# Patient Record
Sex: Female | Born: 1950 | Race: White | Hispanic: No | Marital: Married | Smoking: Never smoker
Health system: Southern US, Community
[De-identification: ages and names within clinical notes are randomized; demographics above are authoritative.]

## PROBLEM LIST (undated history)

## (undated) ENCOUNTER — Emergency Department (HOSPITAL_COMMUNITY): Discharge status: Against Medical Advice | Payer: 59

## (undated) DIAGNOSIS — I350 Nonrheumatic aortic (valve) stenosis: Secondary | ICD-10-CM

## (undated) DIAGNOSIS — I2510 Atherosclerotic heart disease of native coronary artery without angina pectoris: Secondary | ICD-10-CM

## (undated) DIAGNOSIS — I639 Cerebral infarction, unspecified: Secondary | ICD-10-CM

## (undated) DIAGNOSIS — K589 Irritable bowel syndrome without diarrhea: Secondary | ICD-10-CM

## (undated) DIAGNOSIS — Z9989 Dependence on other enabling machines and devices: Secondary | ICD-10-CM

## (undated) DIAGNOSIS — E785 Hyperlipidemia, unspecified: Secondary | ICD-10-CM

## (undated) DIAGNOSIS — I679 Cerebrovascular disease, unspecified: Secondary | ICD-10-CM

## (undated) DIAGNOSIS — Z789 Other specified health status: Secondary | ICD-10-CM

## (undated) DIAGNOSIS — K648 Other hemorrhoids: Secondary | ICD-10-CM

## (undated) DIAGNOSIS — K760 Fatty (change of) liver, not elsewhere classified: Secondary | ICD-10-CM

## (undated) DIAGNOSIS — G4733 Obstructive sleep apnea (adult) (pediatric): Secondary | ICD-10-CM

## (undated) DIAGNOSIS — M79643 Pain in unspecified hand: Secondary | ICD-10-CM

## (undated) DIAGNOSIS — I10 Essential (primary) hypertension: Secondary | ICD-10-CM

## (undated) DIAGNOSIS — E039 Hypothyroidism, unspecified: Secondary | ICD-10-CM

## (undated) DIAGNOSIS — Z87442 Personal history of urinary calculi: Secondary | ICD-10-CM

## (undated) DIAGNOSIS — C439 Malignant melanoma of skin, unspecified: Secondary | ICD-10-CM

## (undated) DIAGNOSIS — E119 Type 2 diabetes mellitus without complications: Secondary | ICD-10-CM

## (undated) DIAGNOSIS — D126 Benign neoplasm of colon, unspecified: Secondary | ICD-10-CM

## (undated) DIAGNOSIS — R7989 Other specified abnormal findings of blood chemistry: Secondary | ICD-10-CM

## (undated) DIAGNOSIS — K5732 Diverticulitis of large intestine without perforation or abscess without bleeding: Secondary | ICD-10-CM

## (undated) DIAGNOSIS — S99929A Unspecified injury of unspecified foot, initial encounter: Secondary | ICD-10-CM

## (undated) DIAGNOSIS — R011 Cardiac murmur, unspecified: Secondary | ICD-10-CM

## (undated) DIAGNOSIS — R945 Abnormal results of liver function studies: Secondary | ICD-10-CM

## (undated) HISTORY — PX: MELANOMA EXCISION: SHX5266

## (undated) HISTORY — DX: Other specified health status: Z78.9

## (undated) HISTORY — DX: Other hemorrhoids: K64.8

## (undated) HISTORY — DX: Hyperlipidemia, unspecified: E78.5

## (undated) HISTORY — PX: TUBAL LIGATION: SHX77

## (undated) HISTORY — DX: Other specified abnormal findings of blood chemistry: R79.89

## (undated) HISTORY — DX: Diverticulitis of large intestine without perforation or abscess without bleeding: K57.32

## (undated) HISTORY — DX: Pain in unspecified hand: M79.643

## (undated) HISTORY — DX: Cerebrovascular disease, unspecified: I67.9

## (undated) HISTORY — PX: OTHER SURGICAL HISTORY: SHX169

## (undated) HISTORY — DX: Cerebral infarction, unspecified: I63.9

## (undated) HISTORY — PX: CARDIAC CATHETERIZATION: SHX172

## (undated) HISTORY — DX: Irritable bowel syndrome, unspecified: K58.9

## (undated) HISTORY — DX: Atherosclerotic heart disease of native coronary artery without angina pectoris: I25.10

## (undated) HISTORY — DX: Unspecified injury of unspecified foot, initial encounter: S99.929A

## (undated) HISTORY — DX: Benign neoplasm of colon, unspecified: D12.6

## (undated) HISTORY — DX: Nonrheumatic aortic (valve) stenosis: I35.0

## (undated) HISTORY — DX: Abnormal results of liver function studies: R94.5

## (undated) HISTORY — DX: Fatty (change of) liver, not elsewhere classified: K76.0

---

## 1997-09-29 ENCOUNTER — Other Ambulatory Visit: Admission: RE | Admit: 1997-09-29 | Discharge: 1997-09-29 | Payer: Self-pay | Admitting: Obstetrics and Gynecology

## 1997-10-14 ENCOUNTER — Ambulatory Visit (HOSPITAL_COMMUNITY): Admission: RE | Admit: 1997-10-14 | Discharge: 1997-10-14 | Payer: Self-pay | Admitting: Obstetrics and Gynecology

## 1998-11-10 ENCOUNTER — Ambulatory Visit (HOSPITAL_COMMUNITY): Admission: RE | Admit: 1998-11-10 | Discharge: 1998-11-10 | Payer: Self-pay | Admitting: Obstetrics and Gynecology

## 1999-02-28 ENCOUNTER — Other Ambulatory Visit: Admission: RE | Admit: 1999-02-28 | Discharge: 1999-02-28 | Payer: Self-pay | Admitting: Obstetrics and Gynecology

## 1999-10-21 ENCOUNTER — Encounter: Payer: Self-pay | Admitting: Family Medicine

## 1999-10-21 LAB — CONVERTED CEMR LAB

## 1999-11-14 ENCOUNTER — Encounter: Payer: Self-pay | Admitting: Obstetrics and Gynecology

## 1999-11-14 ENCOUNTER — Ambulatory Visit (HOSPITAL_COMMUNITY): Admission: RE | Admit: 1999-11-14 | Discharge: 1999-11-14 | Payer: Self-pay | Admitting: Obstetrics and Gynecology

## 1999-11-18 ENCOUNTER — Encounter: Admission: RE | Admit: 1999-11-18 | Discharge: 1999-11-18 | Payer: Self-pay | Admitting: Obstetrics and Gynecology

## 1999-11-18 ENCOUNTER — Encounter: Payer: Self-pay | Admitting: Obstetrics and Gynecology

## 2000-05-08 ENCOUNTER — Other Ambulatory Visit: Admission: RE | Admit: 2000-05-08 | Discharge: 2000-05-08 | Payer: Self-pay | Admitting: Obstetrics and Gynecology

## 2000-11-20 ENCOUNTER — Ambulatory Visit (HOSPITAL_COMMUNITY): Admission: RE | Admit: 2000-11-20 | Discharge: 2000-11-20 | Payer: Self-pay | Admitting: Obstetrics and Gynecology

## 2000-11-20 ENCOUNTER — Encounter: Payer: Self-pay | Admitting: Obstetrics and Gynecology

## 2001-06-20 ENCOUNTER — Other Ambulatory Visit: Admission: RE | Admit: 2001-06-20 | Discharge: 2001-06-20 | Payer: Self-pay | Admitting: Obstetrics and Gynecology

## 2001-06-25 ENCOUNTER — Encounter: Admission: RE | Admit: 2001-06-25 | Discharge: 2001-06-25 | Payer: Self-pay | Admitting: Obstetrics and Gynecology

## 2001-06-25 ENCOUNTER — Encounter: Payer: Self-pay | Admitting: Obstetrics and Gynecology

## 2002-04-11 ENCOUNTER — Encounter: Payer: Self-pay | Admitting: Obstetrics and Gynecology

## 2002-04-11 ENCOUNTER — Ambulatory Visit (HOSPITAL_COMMUNITY): Admission: RE | Admit: 2002-04-11 | Discharge: 2002-04-11 | Payer: Self-pay | Admitting: Obstetrics and Gynecology

## 2002-06-27 ENCOUNTER — Other Ambulatory Visit: Admission: RE | Admit: 2002-06-27 | Discharge: 2002-06-27 | Payer: Self-pay | Admitting: Obstetrics and Gynecology

## 2003-04-30 ENCOUNTER — Encounter: Admission: RE | Admit: 2003-04-30 | Discharge: 2003-04-30 | Payer: Self-pay | Admitting: Obstetrics and Gynecology

## 2003-07-31 ENCOUNTER — Other Ambulatory Visit: Admission: RE | Admit: 2003-07-31 | Discharge: 2003-07-31 | Payer: Self-pay | Admitting: Obstetrics and Gynecology

## 2004-04-24 ENCOUNTER — Emergency Department (HOSPITAL_COMMUNITY): Admission: EM | Admit: 2004-04-24 | Discharge: 2004-04-24 | Payer: Self-pay | Admitting: Family Medicine

## 2004-05-02 ENCOUNTER — Ambulatory Visit: Payer: Self-pay | Admitting: Family Medicine

## 2004-06-20 ENCOUNTER — Encounter: Admission: RE | Admit: 2004-06-20 | Discharge: 2004-06-20 | Payer: Self-pay | Admitting: Obstetrics and Gynecology

## 2004-06-21 ENCOUNTER — Ambulatory Visit: Payer: Self-pay | Admitting: Family Medicine

## 2004-07-27 ENCOUNTER — Ambulatory Visit: Payer: Self-pay | Admitting: Family Medicine

## 2004-08-03 ENCOUNTER — Other Ambulatory Visit: Admission: RE | Admit: 2004-08-03 | Discharge: 2004-08-03 | Payer: Self-pay | Admitting: Obstetrics and Gynecology

## 2004-09-07 ENCOUNTER — Ambulatory Visit: Payer: Self-pay | Admitting: Family Medicine

## 2004-10-04 ENCOUNTER — Ambulatory Visit: Payer: Self-pay | Admitting: Family Medicine

## 2004-11-28 ENCOUNTER — Ambulatory Visit: Payer: Self-pay | Admitting: Family Medicine

## 2005-01-26 ENCOUNTER — Ambulatory Visit: Payer: Self-pay | Admitting: Family Medicine

## 2005-03-31 ENCOUNTER — Ambulatory Visit: Payer: Self-pay | Admitting: Family Medicine

## 2005-04-27 ENCOUNTER — Ambulatory Visit: Payer: Self-pay | Admitting: Family Medicine

## 2005-05-09 ENCOUNTER — Ambulatory Visit: Payer: Self-pay | Admitting: Family Medicine

## 2005-05-16 ENCOUNTER — Ambulatory Visit: Payer: Self-pay | Admitting: Internal Medicine

## 2005-05-19 ENCOUNTER — Ambulatory Visit (HOSPITAL_COMMUNITY): Admission: RE | Admit: 2005-05-19 | Discharge: 2005-05-19 | Payer: Self-pay | Admitting: Internal Medicine

## 2005-05-19 ENCOUNTER — Encounter (INDEPENDENT_AMBULATORY_CARE_PROVIDER_SITE_OTHER): Payer: Self-pay | Admitting: *Deleted

## 2005-05-19 ENCOUNTER — Ambulatory Visit: Payer: Self-pay | Admitting: Internal Medicine

## 2005-05-19 HISTORY — PX: COLONOSCOPY W/ BIOPSIES AND POLYPECTOMY: SHX1376

## 2005-05-29 ENCOUNTER — Ambulatory Visit: Payer: Self-pay | Admitting: Cardiology

## 2005-06-27 ENCOUNTER — Encounter: Admission: RE | Admit: 2005-06-27 | Discharge: 2005-06-27 | Payer: Self-pay | Admitting: Obstetrics and Gynecology

## 2005-07-03 ENCOUNTER — Encounter: Admission: RE | Admit: 2005-07-03 | Discharge: 2005-07-03 | Payer: Self-pay | Admitting: Obstetrics and Gynecology

## 2005-07-12 ENCOUNTER — Ambulatory Visit: Payer: Self-pay | Admitting: Cardiology

## 2005-07-13 ENCOUNTER — Ambulatory Visit: Payer: Self-pay | Admitting: Cardiology

## 2005-08-17 ENCOUNTER — Ambulatory Visit (HOSPITAL_COMMUNITY): Admission: RE | Admit: 2005-08-17 | Discharge: 2005-08-17 | Payer: Self-pay | Admitting: Otolaryngology

## 2005-08-30 ENCOUNTER — Ambulatory Visit (HOSPITAL_BASED_OUTPATIENT_CLINIC_OR_DEPARTMENT_OTHER): Admission: RE | Admit: 2005-08-30 | Discharge: 2005-08-30 | Payer: Self-pay | Admitting: Otolaryngology

## 2005-09-03 ENCOUNTER — Ambulatory Visit: Payer: Self-pay | Admitting: Internal Medicine

## 2005-09-29 ENCOUNTER — Ambulatory Visit: Payer: Self-pay | Admitting: Pulmonary Disease

## 2005-11-01 ENCOUNTER — Ambulatory Visit: Payer: Self-pay | Admitting: Pulmonary Disease

## 2006-03-29 ENCOUNTER — Ambulatory Visit: Payer: Self-pay | Admitting: Family Medicine

## 2006-04-26 ENCOUNTER — Ambulatory Visit: Payer: Self-pay | Admitting: Family Medicine

## 2006-05-22 DIAGNOSIS — S99929A Unspecified injury of unspecified foot, initial encounter: Secondary | ICD-10-CM

## 2006-05-22 HISTORY — DX: Unspecified injury of unspecified foot, initial encounter: S99.929A

## 2006-05-23 ENCOUNTER — Encounter: Admission: RE | Admit: 2006-05-23 | Discharge: 2006-08-21 | Payer: Self-pay | Admitting: Family Medicine

## 2006-07-03 ENCOUNTER — Ambulatory Visit: Payer: Self-pay | Admitting: Family Medicine

## 2006-07-03 LAB — CONVERTED CEMR LAB: Direct LDL: 228.4 mg/dL

## 2006-07-04 ENCOUNTER — Encounter: Payer: Self-pay | Admitting: Family Medicine

## 2006-09-05 ENCOUNTER — Encounter: Admission: RE | Admit: 2006-09-05 | Discharge: 2006-09-05 | Payer: Self-pay | Admitting: Obstetrics and Gynecology

## 2006-09-18 ENCOUNTER — Encounter: Payer: Self-pay | Admitting: Family Medicine

## 2006-09-18 DIAGNOSIS — E0590 Thyrotoxicosis, unspecified without thyrotoxic crisis or storm: Secondary | ICD-10-CM | POA: Insufficient documentation

## 2006-09-18 DIAGNOSIS — E785 Hyperlipidemia, unspecified: Secondary | ICD-10-CM

## 2006-09-18 DIAGNOSIS — G4733 Obstructive sleep apnea (adult) (pediatric): Secondary | ICD-10-CM

## 2006-09-18 DIAGNOSIS — E1165 Type 2 diabetes mellitus with hyperglycemia: Secondary | ICD-10-CM

## 2006-09-18 DIAGNOSIS — R7989 Other specified abnormal findings of blood chemistry: Secondary | ICD-10-CM | POA: Insufficient documentation

## 2006-10-03 ENCOUNTER — Ambulatory Visit: Payer: Self-pay | Admitting: Family Medicine

## 2006-10-03 DIAGNOSIS — L259 Unspecified contact dermatitis, unspecified cause: Secondary | ICD-10-CM

## 2006-10-10 ENCOUNTER — Ambulatory Visit: Payer: Self-pay | Admitting: Family Medicine

## 2007-05-02 ENCOUNTER — Encounter: Payer: Self-pay | Admitting: Family Medicine

## 2007-05-23 DIAGNOSIS — M79643 Pain in unspecified hand: Secondary | ICD-10-CM

## 2007-05-23 HISTORY — DX: Pain in unspecified hand: M79.643

## 2007-06-28 ENCOUNTER — Ambulatory Visit: Payer: Self-pay | Admitting: Family Medicine

## 2007-06-28 DIAGNOSIS — I10 Essential (primary) hypertension: Secondary | ICD-10-CM

## 2007-07-04 LAB — CONVERTED CEMR LAB
ALT: 52 units/L — ABNORMAL HIGH (ref 0–35)
AST: 30 units/L (ref 0–37)
Basophils Relative: 0.6 % (ref 0.0–1.0)
Bilirubin, Direct: 0.1 mg/dL (ref 0.0–0.3)
CO2: 28 meq/L (ref 19–32)
Calcium: 9.6 mg/dL (ref 8.4–10.5)
Chloride: 102 meq/L (ref 96–112)
Eosinophils Relative: 2.8 % (ref 0.0–5.0)
GFR calc non Af Amer: 93 mL/min
Glucose, Bld: 157 mg/dL — ABNORMAL HIGH (ref 70–99)
HDL: 32 mg/dL — ABNORMAL LOW (ref >39.0)
Hgb A1c MFr Bld: 7.8 % — ABNORMAL HIGH (ref 4.6–6.0)
Platelets: 216 10*3/uL (ref 150–400)
RBC: 4.47 M/uL (ref 3.87–5.11)
RDW: 12.4 % (ref 11.5–14.6)
Total Protein: 6.8 g/dL (ref 6.0–8.3)
Triglycerides: 207 mg/dL (ref 0–149)
VLDL: 41 mg/dL — ABNORMAL HIGH (ref 0–40)
WBC: 5.1 10*3/uL (ref 4.5–10.5)

## 2007-07-08 ENCOUNTER — Telehealth (INDEPENDENT_AMBULATORY_CARE_PROVIDER_SITE_OTHER): Payer: Self-pay | Admitting: Internal Medicine

## 2007-07-08 ENCOUNTER — Telehealth: Payer: Self-pay | Admitting: Family Medicine

## 2007-07-10 ENCOUNTER — Ambulatory Visit: Payer: Self-pay | Admitting: Family Medicine

## 2007-10-01 ENCOUNTER — Encounter: Admission: RE | Admit: 2007-10-01 | Discharge: 2007-10-01 | Payer: Self-pay | Admitting: Obstetrics and Gynecology

## 2007-12-31 ENCOUNTER — Ambulatory Visit: Payer: Self-pay | Admitting: Family Medicine

## 2008-01-01 LAB — CONVERTED CEMR LAB
BUN: 17 mg/dL (ref 6–23)
CO2: 29 meq/L (ref 19–32)
Calcium: 10.2 mg/dL (ref 8.4–10.5)
Cholesterol: 295 mg/dL (ref 0–200)
Creatinine, Ser: 0.5 mg/dL (ref 0.4–1.2)
Creatinine,U: 58.3 mg/dL
Direct LDL: 222.8 mg/dL
Total CHOL/HDL Ratio: 7.9
Triglycerides: 214 mg/dL (ref 0–149)

## 2008-01-29 ENCOUNTER — Telehealth (INDEPENDENT_AMBULATORY_CARE_PROVIDER_SITE_OTHER): Payer: Self-pay | Admitting: Internal Medicine

## 2008-02-06 ENCOUNTER — Ambulatory Visit: Payer: Self-pay | Admitting: Cardiology

## 2008-03-03 ENCOUNTER — Ambulatory Visit: Payer: Self-pay | Admitting: Family Medicine

## 2008-03-04 LAB — CONVERTED CEMR LAB: Hgb A1c MFr Bld: 6.5 % — ABNORMAL HIGH (ref 4.6–6.0)

## 2008-03-22 LAB — HM DIABETES EYE EXAM: HM Diabetic Eye Exam: NORMAL

## 2008-07-07 ENCOUNTER — Ambulatory Visit: Payer: Self-pay | Admitting: Family Medicine

## 2008-07-14 ENCOUNTER — Telehealth (INDEPENDENT_AMBULATORY_CARE_PROVIDER_SITE_OTHER): Payer: Self-pay | Admitting: Internal Medicine

## 2008-07-14 LAB — CONVERTED CEMR LAB
Creatinine,U: 65.8 mg/dL
Microalb Creat Ratio: 3 mg/g (ref 0.0–30.0)
Microalb, Ur: 0.2 mg/dL (ref 0.0–1.9)

## 2008-07-17 ENCOUNTER — Telehealth (INDEPENDENT_AMBULATORY_CARE_PROVIDER_SITE_OTHER): Payer: Self-pay | Admitting: Internal Medicine

## 2008-11-12 ENCOUNTER — Ambulatory Visit: Payer: Self-pay | Admitting: Family Medicine

## 2008-11-12 DIAGNOSIS — R21 Rash and other nonspecific skin eruption: Secondary | ICD-10-CM | POA: Insufficient documentation

## 2009-04-01 ENCOUNTER — Ambulatory Visit: Payer: Self-pay | Admitting: Family Medicine

## 2009-04-08 ENCOUNTER — Telehealth (INDEPENDENT_AMBULATORY_CARE_PROVIDER_SITE_OTHER): Payer: Self-pay | Admitting: Internal Medicine

## 2009-04-12 ENCOUNTER — Telehealth (INDEPENDENT_AMBULATORY_CARE_PROVIDER_SITE_OTHER): Payer: Self-pay | Admitting: Internal Medicine

## 2009-04-13 ENCOUNTER — Encounter (INDEPENDENT_AMBULATORY_CARE_PROVIDER_SITE_OTHER): Payer: Self-pay | Admitting: Internal Medicine

## 2009-04-14 LAB — CONVERTED CEMR LAB
ALT: 62 units/L — ABNORMAL HIGH (ref 0–35)
AST: 34 units/L (ref 0–37)
Calcium: 10 mg/dL (ref 8.4–10.5)
Creatinine, Ser: 0.6 mg/dL (ref 0.4–1.2)
Direct LDL: 240.5 mg/dL
GFR calc non Af Amer: 109.7 mL/min (ref >60)
Hgb A1c MFr Bld: 6.9 % — ABNORMAL HIGH (ref 4.6–6.5)
Sodium: 142 meq/L (ref 135–145)
Total CHOL/HDL Ratio: 8
VLDL: 34.2 mg/dL (ref 0.0–40.0)

## 2009-04-22 ENCOUNTER — Ambulatory Visit: Payer: Self-pay | Admitting: Cardiology

## 2009-04-22 LAB — CONVERTED CEMR LAB: Cholesterol, target level: 200 mg/dL

## 2009-05-31 ENCOUNTER — Ambulatory Visit: Payer: Self-pay | Admitting: Family Medicine

## 2009-05-31 ENCOUNTER — Telehealth: Payer: Self-pay | Admitting: Internal Medicine

## 2009-05-31 LAB — CONVERTED CEMR LAB
AST: 30 units/L (ref 0–37)
Direct LDL: 185.6 mg/dL
HDL: 43.2 mg/dL (ref >39.00)
Total CHOL/HDL Ratio: 6
VLDL: 38 mg/dL (ref 0.0–40.0)

## 2009-06-08 ENCOUNTER — Telehealth: Payer: Self-pay | Admitting: Family Medicine

## 2009-06-10 ENCOUNTER — Ambulatory Visit: Payer: Self-pay | Admitting: Cardiology

## 2009-06-15 ENCOUNTER — Ambulatory Visit: Payer: Self-pay | Admitting: Family Medicine

## 2009-06-16 ENCOUNTER — Encounter: Admission: RE | Admit: 2009-06-16 | Discharge: 2009-06-16 | Payer: Self-pay | Admitting: Family Medicine

## 2009-08-09 ENCOUNTER — Telehealth: Payer: Self-pay | Admitting: Internal Medicine

## 2009-09-22 ENCOUNTER — Telehealth (INDEPENDENT_AMBULATORY_CARE_PROVIDER_SITE_OTHER): Payer: Self-pay | Admitting: *Deleted

## 2009-09-27 ENCOUNTER — Telehealth: Payer: Self-pay | Admitting: Family Medicine

## 2009-09-28 ENCOUNTER — Ambulatory Visit: Payer: Self-pay | Admitting: Family Medicine

## 2009-09-29 ENCOUNTER — Ambulatory Visit: Payer: Self-pay | Admitting: Family Medicine

## 2009-09-29 LAB — CONVERTED CEMR LAB
ALT: 54 units/L — ABNORMAL HIGH (ref 0–35)
Albumin: 4.4 g/dL (ref 3.5–5.2)
Alkaline Phosphatase: 63 units/L (ref 39–117)
CO2: 30 meq/L (ref 19–32)
Calcium: 9.5 mg/dL (ref 8.4–10.5)
Chloride: 105 meq/L (ref 96–112)
Creatinine, Ser: 0.5 mg/dL (ref 0.4–1.2)
Glucose, Bld: 121 mg/dL — ABNORMAL HIGH (ref 70–99)
Hgb A1c MFr Bld: 6.8 % — ABNORMAL HIGH (ref 4.6–6.5)
Sodium: 143 meq/L (ref 135–145)
Total CHOL/HDL Ratio: 5
Total Protein: 6.8 g/dL (ref 6.0–8.3)
Triglycerides: 159 mg/dL — ABNORMAL HIGH (ref 0.0–149.0)

## 2009-09-30 ENCOUNTER — Ambulatory Visit: Payer: Self-pay | Admitting: Internal Medicine

## 2010-01-04 ENCOUNTER — Ambulatory Visit: Payer: Self-pay | Admitting: Family Medicine

## 2010-01-04 ENCOUNTER — Telehealth (INDEPENDENT_AMBULATORY_CARE_PROVIDER_SITE_OTHER): Payer: Self-pay | Admitting: *Deleted

## 2010-01-06 LAB — CONVERTED CEMR LAB
ALT: 53 units/L — ABNORMAL HIGH (ref 0–35)
AST: 27 units/L (ref 0–37)
BUN: 11 mg/dL (ref 6–23)
CO2: 28 meq/L (ref 19–32)
Chloride: 103 meq/L (ref 96–112)
Cholesterol: 229 mg/dL — ABNORMAL HIGH (ref 0–200)
Creatinine, Ser: 0.7 mg/dL (ref 0.4–1.2)
Glucose, Bld: 156 mg/dL — ABNORMAL HIGH (ref 70–99)
HDL: 37.2 mg/dL — ABNORMAL LOW (ref >39.00)
Potassium: 4.8 meq/L (ref 3.5–5.1)
Triglycerides: 213 mg/dL — ABNORMAL HIGH (ref 0.0–149.0)

## 2010-01-13 ENCOUNTER — Ambulatory Visit: Payer: Self-pay | Admitting: Internal Medicine

## 2010-06-03 ENCOUNTER — Ambulatory Visit
Admission: RE | Admit: 2010-06-03 | Discharge: 2010-06-03 | Payer: Self-pay | Source: Home / Self Care | Attending: Family Medicine | Admitting: Family Medicine

## 2010-06-03 ENCOUNTER — Other Ambulatory Visit: Payer: Self-pay | Admitting: Family Medicine

## 2010-06-03 ENCOUNTER — Telehealth (INDEPENDENT_AMBULATORY_CARE_PROVIDER_SITE_OTHER): Payer: Self-pay | Admitting: *Deleted

## 2010-06-03 LAB — LIPID PANEL
Cholesterol: 279 mg/dL — ABNORMAL HIGH (ref 0–200)
HDL: 43.1 mg/dL (ref >39.00)
Total CHOL/HDL Ratio: 6
Triglycerides: 223 mg/dL — ABNORMAL HIGH (ref 0.0–149.0)
VLDL: 44.6 mg/dL — ABNORMAL HIGH (ref 0.0–40.0)

## 2010-06-03 LAB — GLUCOSE, RANDOM: Glucose, Bld: 138 mg/dL — ABNORMAL HIGH (ref 70–99)

## 2010-06-03 LAB — HEMOGLOBIN A1C: Hgb A1c MFr Bld: 7.3 % — ABNORMAL HIGH (ref 4.6–6.5)

## 2010-06-03 LAB — HEPATIC FUNCTION PANEL
ALT: 53 U/L — ABNORMAL HIGH (ref 0–35)
AST: 33 U/L (ref 0–37)
Albumin: 4.4 g/dL (ref 3.5–5.2)
Alkaline Phosphatase: 56 U/L (ref 39–117)
Bilirubin, Direct: 0.1 mg/dL (ref 0.0–0.3)
Total Bilirubin: 1 mg/dL (ref 0.3–1.2)
Total Protein: 7 g/dL (ref 6.0–8.3)

## 2010-06-03 LAB — TSH: TSH: 3.59 u[IU]/mL (ref 0.35–5.50)

## 2010-06-03 LAB — LDL CHOLESTEROL, DIRECT: Direct LDL: 220.3 mg/dL

## 2010-06-09 ENCOUNTER — Ambulatory Visit: Admit: 2010-06-09 | Payer: Self-pay

## 2010-06-10 ENCOUNTER — Ambulatory Visit
Admission: RE | Admit: 2010-06-10 | Discharge: 2010-06-10 | Payer: Self-pay | Source: Home / Self Care | Attending: Family Medicine | Admitting: Family Medicine

## 2010-06-12 ENCOUNTER — Encounter: Payer: Self-pay | Admitting: Obstetrics and Gynecology

## 2010-06-12 ENCOUNTER — Encounter: Payer: Self-pay | Admitting: Family Medicine

## 2010-06-13 ENCOUNTER — Ambulatory Visit: Admit: 2010-06-13 | Payer: Self-pay | Admitting: Family Medicine

## 2010-06-21 NOTE — Progress Notes (Signed)
Summary: Labs prior to appt  Phone Note Call from Patient Call back at Work Phone [phone]   Caller: Patient Call For: Dr. Dayton Martes Summary of Call: Patient has an appt on Wednesday with Dr. Dayton Martes and she wants labs drawn before her appt.  She said she could come by tomorrow morning and have her labs drawn.  Please advise.   Initial call taken by: Linde Gillis CMA Duncan Dull),  Sep 27, 2009 1:29 PM  Follow-up for Phone Call        Yes we can draw TSH (242.9), a1c (250.), BMET(242.9), lipid panel, hepatic panel (272.4) Ruthe Mannan MD  Sep 27, 2009 1:32 PM  Patient advised, appt made for the above labs on 09/28/2009. Follow-up by: Linde Gillis CMA Duncan Dull),  Sep 27, 2009 1:37 PM

## 2010-06-21 NOTE — Progress Notes (Signed)
----   Converted from flag ---- ---- 01/04/2010 8:36 AM, Ruthe Mannan MD wrote: yes ok to add TSH and BMET  ---- 01/04/2010 7:58 AM, Liane Comber CMA (AAMA) wrote: Pt had labs this am she request to have thyroid and blood sugar test to las. She says thyroid med was changed months ago and she started a blood sugar med. ------------------------------

## 2010-06-21 NOTE — Progress Notes (Signed)
Summary: samples   Phone Note Call from Patient   Caller: Patient Reason for Call: Talk to Nurse Summary of Call: pt wants to know if she can get samples of her Zetia. please call her and let her know Initial call taken by: Edman Circle,  Sep 22, 2009 8:11 AM  Follow-up for Phone Call        Spoke with pt. and she is aware to pick up samples at front desk.  lot # [id number] Exp 06/13  #21 tablets Follow-up by: Bethena Midget, RN, BSN,  Sep 22, 2009 8:59 AM

## 2010-06-21 NOTE — Assessment & Plan Note (Signed)
Summary: rov..mp   Visit Type:  Follow-up  CC:  dyslipidemia follow-up.  History of Present Illness:    Lipid Clinic Visit      The patient comes in today for dyslipidemia follow-up.  The patient has no complaints of medication problems, chest pain, shortness of breath, muscle aches, and muscle cramps.  Her current cholesterol therapy includes Zetia 10mg  daily and fish oil 4mg  daily.  She is intolerant to statins, niacin and Welchol. She has recently had her TSH checked and Synthroid dose adjusted.  Dietary compliance review reveals pt is starting a new diet.  She is doing proteins only for 1 week, protein and vegetables for 1 week, then adding carbohydrates back in the 3rd week.  Her typical meals now are breakfast: boiled egg or egg white and oatmeal, lunch- chicken, and dinner- stir-fried beef.   Review of exercise habits reveals that the patient is not exercising.  She had previously been walking on the treadmill for 20 minutes 2-3 times a week but has not done this since January because her treadmill was broken.    Lipid Management Provider  Weston Brass, PharmD  Current Medications (verified): 1)  Altace 2.5 Mg Caps (Ramipril) .... Take One By Mouth Daily 2)  Fish Oil  Oil (Fish Oil) .... Take Two By Mouth Two Times A Day As Directed 3)  Flax Seed Oil  Caps (Flaxseed (Linseed) Caps) .... Take Two By Mouth Q Am 4)  Aspirin 81 Mg Tbec (Aspirin) .... Take One By Mouth Daily 5)  Daily Vitamins/iron  Tabs (Multiple Vitamins-Iron) .... Take By Mouth Daily As Directed 6)  Onetouch Ultra Test   Strp (Glucose Blood) .... Use Daily As Directed 7)  Caltrate 600+d Plus 600-400 Mg-Unit  Tabs (Calcium Carbonate-Vit D-Min) .... Take 1 Tablet By Mouth Once A Day 8)  Metformin Hcl 500 Mg  Tabs (Metformin Hcl) .Marland Kitchen.. 1 By Mouth Two Times A Day 9)  Vitamin D 1000 Unit Caps (Cholecalciferol) .... Take One By Mouth Two Times A Day 10)  Zetia 10 Mg Tabs (Ezetimibe) .... Take One Tablet By Mouth Daily. 11)   Synthroid 100 Mcg Tabs (Levothyroxine Sodium) .Marland Kitchen.. 1 Tab By Mouth Daily 12)  Fenofibrate 54 Mg Tabs (Fenofibrate) .... Take One Tablet By Mouth Daily With A Meal  Allergies (verified): 1)  ! Sulfa 2)  ! Zocor 3)  ! Pravachol 4)  ! * Niaspan 5)  ! Lipitor 6)  ! * Crestor 7)  ! Noni Saupe  Past History:  Past Medical History: Last updated: 07/10/2007 Diabetes mellitus, type II Hyperlipidemia- with intol of all meds and no success at lipid clinic Hyperthyroidism- adv to hypothyroidism ? thyroid nodules Chronic pain and swelling of hard palate L foot/leg injury with ? torn muscle- 08   Vital Signs:  Patient profile:   60 year old female Height:      64.25 inches Weight:      179 pounds BMI:     30.60 Pulse rate:   82 / minute BP sitting:   102 / 70  (right arm)  Impression & Recommendations:  Problem # 1:  HYPERLIPIDEMIA (ICD-272.4) Assessment Improved Based on pt's previous lipid panel, her cholesterol has improved but she is still above goal.  Her TC 222 (goal <200), TG- 159 (goal <150), HDL 42.6 (goal>45) and LDL 171.1 (goal <70).  She is tolerating her current therapy but has a long history of medication intolerances.  She does not remember trying a fibrate in the past.  Will add a low dose fenofibrate to her regimen to see if it will help lower LDL closer to goal.  We discussed her new diet plan and the importance of a balanced meal.  I encouraged her to make vegetables 50% of the meal and meats and starches only 25% of her meal at the most.  She is also willing to replace her broken treadmill and start walking most days of the week.  We will recheck her lipid panel in 3 months.  She has been instructed to call if she experiences any side effects from the fenofibrate.    Her updated medication list for this problem includes:    Zetia 10 Mg Tabs (Ezetimibe) .Marland Kitchen... Take one tablet by mouth daily.    Fenofibrate 54 Mg Tabs (Fenofibrate) .Marland Kitchen... Take one tablet by mouth daily with  a meal  Patient Instructions: 1)  Start new medication- fenofibrate daily- Take this with food to help with upset stomach 2)  Continue diet high in protein and vegetables and low in starches 3)  Restart exercising on the treadmill for 20 minutes 3-4 times a week 4)  Recheck lipid panel on  5)  Next Lipid Clinic visit: 01/13/2010 at 3pm  Prescriptions: FENOFIBRATE 54 MG TABS (FENOFIBRATE) Take one tablet by mouth daily with a meal  #30 x 3   Entered by:   Weston Brass PharmD   Authorized by:   Hillis Range, MD   Signed by:   Weston Brass PharmD on 09/30/2009   Method used:   [facility]        [facility]  [address]ail[facility]       [address]       [place], [place]  [address]       Ph: [phone] or [phone]       Fax: [phone]   RxID:   [id number]

## 2010-06-21 NOTE — Progress Notes (Signed)
Summary: Wants lab results &? about synthroid  Phone Note Call from Patient Call back at [phone]   Caller: Patient Call For: Dr. Dayton Martes Summary of Call: Pt was [name] Bean's pt and is going to lipid clinic in [place]. Pt had lab test done on 05/31/09 and would like lab results called to her. Pt wonders if should continue Synthroid take one daily Brand name medically necessary.  If pt is to continue the Synthroid she will need refills [facility] to [facility] [phone]. pt can be reached at [phone] and pt said OK to wait until 06/09/09 when Dr. Dayton Martes returns to get answer for her question. Pt has appt with Dr. Dayton Martes on 09/29/09. Please advise.  Initial call taken by: Lewanda Rife LPN,  June 08, 2009 2:02 PM  Follow-up for Phone Call        I have not met this patient and I am not sure why her lab results were not followed.  TSH is a little low, I would recommend backing off to a lower dose but I would like to talk with her first to ask questions like whether or not she has tolerated lower doses in past, etc.  Can she make appt with me? Follow-up by: Ruthe Mannan MD,  June 08, 2009 8:23 PM     Appended Document: Wants lab results &? about synthroid Patient Advised. Appointment scheduled  06/15/09.

## 2010-06-21 NOTE — Assessment & Plan Note (Signed)
Summary: FOLLOW UP / LFW   Vital Signs:  Patient profile:   60 year old female Height:      64.25 inches Weight:      179 pounds BMI:     30.60 Temp:     98.4 degrees F oral Pulse rate:   80 / minute Pulse rhythm:   regular BP sitting:   122 / 74  (left arm) Cuff size:   regular  Vitals Entered By: Delilah Shan CMA Duncan Dull) (Sep 29, 2009 8:26 AM) CC: 6 months follow up   History of Present Illness: 60 yo pt here for follow up hyperthyroidism and HLD.  Hyperthyroidism- TSH was checked in 11/10 and it was 0.12.  [name] then decreased her Synthroid dosage from 137 micrograms (she had been on for several years) to 125 micrograms.  Rechecked last week and TSH in January and was 0.29, decreased to 100 mcg.  Feels much better now.  TSH 2.81.  DM- a1c 6,8 was 6.3 at last check. Taking Metformin 500 mg two times a day. Admits to not being very compliant with diet lately.  HLD- goes to lipid clinic due to elevated lipids and intolerance to meds. [id number] (was 185), HDL 42, and TG 159.  Currently only taking Zetia 10 mg daily but has appt tomorrow.   Current Medications (verified): 1)  Altace 2.5 Mg Caps (Ramipril) .... Take One By Mouth Daily 2)  Fish Oil  Oil (Fish Oil) .... Take Two By Mouth Two Times A Day As Directed 3)  Flax Seed Oil  Caps (Flaxseed (Linseed) Caps) .... Take Two By Mouth Q Am 4)  Aspirin 81 Mg Tbec (Aspirin) .... Take One By Mouth Daily 5)  Daily Vitamins/iron  Tabs (Multiple Vitamins-Iron) .... Take By Mouth Daily As Directed 6)  Onetouch Ultra Test   Strp (Glucose Blood) .... Use Daily As Directed 7)  Caltrate 600+d Plus 600-400 Mg-Unit  Tabs (Calcium Carbonate-Vit D-Min) .... Take 1 Tablet By Mouth Once A Day 8)  Metformin Hcl 500 Mg  Tabs (Metformin Hcl) .Marland Kitchen.. 1 By Mouth Two Times A Day 9)  Vitamin D 1000 Unit Caps (Cholecalciferol) .... Take One By Mouth Two Times A Day 10)  Zetia 10 Mg Tabs (Ezetimibe) .... Take One Tablet By Mouth Daily. 11)  Synthroid  100 Mcg Tabs (Levothyroxine Sodium) .Marland Kitchen.. 1 Tab By Mouth Daily  Allergies: 1)  ! Sulfa 2)  ! Zocor 3)  ! Pravachol 4)  ! * Niaspan 5)  ! Lipitor 6)  ! * Crestor 7)  ! * Wellchol  Physical Exam  General:  alert, well-developed, well-nourished, well-hydrated, and overweight-appearing.    Mouth:  Oral mucosa and oropharynx without lesions or exudates.  Teeth in good repair. Lungs:  normal respiratory effort, no intercostal retractions, no accessory muscle use, and normal breath sounds.   Heart:  normal rate, regular rhythm, and no murmur.   Psych:  normally interactive and good eye contact.     Impression & Recommendations:  Problem # 1:  HYPERTHYROIDISM (ICD-242.90) Assessment Improved continue current dose of Synthroid.  Problem # 2:  DIABETES MELLITUS, TYPE II (ICD-250.00) Assessment: Deteriorated Does not want to increase or change meds yet, wants to try diet first as she knows she has been noncompliant. Her updated medication list for this problem includes:    Altace 2.5 Mg Caps (Ramipril) .Marland Kitchen... Take one by mouth daily    Aspirin 81 Mg Tbec (Aspirin) .Marland Kitchen... Take one by mouth daily  Metformin Hcl 500 Mg Tabs (Metformin hcl) .Marland Kitchen... 1 by mouth two times a day  Problem # 3:  HYPERLIPIDEMIA (ICD-272.4) Assessment: Unchanged Remains poorly controlled.  Given labs to take to appt tomorrow at lipid clinic. Her updated medication list for this problem includes:    Zetia 10 Mg Tabs (Ezetimibe) .Marland Kitchen... Take one tablet by mouth daily.  Complete Medication List: 1)  Altace 2.5 Mg Caps (Ramipril) .... Take one by mouth daily 2)  Fish Oil Oil (Fish oil) .... Take two by mouth two times a day as directed 3)  Flax Seed Oil Caps (Flaxseed (linseed) caps) .... Take two by mouth q am 4)  Aspirin 81 Mg Tbec (Aspirin) .... Take one by mouth daily 5)  Daily Vitamins/iron Tabs (Multiple vitamins-iron) .... Take by mouth daily as directed 6)  Onetouch Ultra Test Strp (Glucose blood) .... Use  daily as directed 7)  Caltrate 600+d Plus 600-400 Mg-unit Tabs (Calcium carbonate-vit d-min) .... Take 1 tablet by mouth once a day 8)  Metformin Hcl 500 Mg Tabs (Metformin hcl) .Marland Kitchen.. 1 by mouth two times a day 9)  Vitamin D 1000 Unit Caps (Cholecalciferol) .... Take one by mouth two times a day 10)  Zetia 10 Mg Tabs (Ezetimibe) .... Take one tablet by mouth daily. 11)  Synthroid 100 Mcg Tabs (Levothyroxine sodium) .Marland Kitchen.. 1 tab by mouth daily  Patient Instructions: 1)  Try to cut out some sugars, carbs. 2)  Come back in 3 months for a1c (250). Prescriptions: ONETOUCH ULTRA TEST   STRP (GLUCOSE BLOOD) USE DAILY AS DIRECTED  #90 x 3   Entered and Authorized by:   Ruthe Mannan MD   Signed by:   Ruthe Mannan MD on 09/29/2009   Method used:   [facility]        [facility]  [address] ([facility]ail)       [address]       [place], [place]  [address]       Ph: [phone] or [phone]       Fax: [phone]   RxID:   [id number]   Current Allergies (reviewed today): ! SULFA ! ZOCOR ! PRAVACHOL ! * NIASPAN ! LIPITOR ! * CRESTOR ! [facility]

## 2010-06-21 NOTE — Progress Notes (Signed)
Summary: pt needs samples of zetia 10mg    Phone Note Call from Patient Call back at Work Phone [phone]   Caller: Patient Summary of Call: want to speak with some one about getting some zetia 10mg  Initial call taken by: Judie Grieve,  May 31, 2009 10:12 AM  Follow-up for Phone Call        Called pt and found out she is at MD office.  Have pulled samples and will return call to patient in 1 hour per receptionist request.   Follow-up by: Shelby Dubin PharmD, BCPS, CPP,  May 31, 2009 10:58 AM  Additional Follow-up for Phone Call Additional follow up Details #1::        Received note from [facility] that they gave samples to patient when she had her labs drawn on 1/10.Marland Kitchenmp Additional Follow-up by: Shelby Dubin PharmD, BCPS, CPP,  June 01, 2009 11:00 AM

## 2010-06-21 NOTE — Progress Notes (Signed)
Summary: next COL?   Phone Note Call from Patient Call back at Home Phone [phone]   Caller: Patient Call For: Dr. Leone Payor Reason for Call: Talk to Nurse Summary of Call: would like to know when her last COL was and when she should have her next Initial call taken by: Vallarie Mare,  August 09, 2009 3:49 PM  Follow-up for Phone Call        Left message for patient to call back Darcey Nora RN, [facility]  August 09, 2009 4:05 PM  Left message for patient to call back Darcey Nora RN, [facility]  August 11, 2009 11:35 AM   No return call from the patient.  I have left her a message that she was due for an REV recall in 09 to discuss a colon, that appointment was never made.  I have asked her to call back and make an appointment to see Dr Leone Payor to discuss colon.    Follow-up by: Darcey Nora RN, CGRN,  August 12, 2009 10:40 AM

## 2010-06-21 NOTE — Assessment & Plan Note (Signed)
Summary: 30 minF/U - Synthroid dosage (BDB pt.) Joanna [name]   Vital Signs:  Patient profile:   60 year old female Height:      64.25 inches Weight:      177.38 pounds BMI:     30.32 Temp:     97.9 degrees F oral Pulse rate:   76 / minute Pulse rhythm:   regular BP sitting:   132 / 82  (left arm) Cuff size:   regular  Vitals Entered By: Delilah Shan CMA Duncan Dull) (June 15, 2009 9:45 AM) CC: 30 min. F/U - Synthroid dosage   History of Present Illness: 60 yo pt of Joanna [name]'s new to me here to discuss TSH.  Hyperthyroidism- TSH was checked in 11/10 and it was 0.12.  Joanna [name] then decreased her Synthroid dosage from 137 micrograms (she had been on for several years) to 125 micrograms.  Rechecked last week and TSH is now 0.29.  She said she does sometimes feel a little jittery.  Denies any diarrhea, heat intolerance, or other symptoms.  Lately she has noticed a funny sensation when she swallows, like her food/liquid may be having a hard time going down.  No pain with swallowing.  Current Medications (verified): 1)  Altace 2.5 Mg Caps (Ramipril) .... Take One By Mouth Daily 2)  Fish Oil  Oil (Fish Oil) .... Take Two By Mouth Two Times A Day As Directed 3)  Flax Seed Oil  Caps (Flaxseed (Linseed) Caps) .... Take Two By Mouth Q Am 4)  Aspirin 81 Mg Tbec (Aspirin) .... Take One By Mouth Daily 5)  Daily Vitamins/iron  Tabs (Multiple Vitamins-Iron) .... Take By Mouth Daily As Directed 6)  Onetouch Ultra Test   Strp (Glucose Blood) .... Use Daily As Directed 7)  Caltrate 600+d Plus 600-400 Mg-Unit  Tabs (Calcium Carbonate-Vit D-Min) .... Take 1 Tablet By Mouth Once A Day 8)  Metformin Hcl 500 Mg  Tabs (Metformin Hcl) .Marland Kitchen.. 1 By Mouth Two Times A Day 9)  Vitamin D 1000 Unit Caps (Cholecalciferol) .... Take One By Mouth Two Times A Day 10)  Zetia 10 Mg Tabs (Ezetimibe) .... Take One Tablet By Mouth Daily. 11)  Synthroid 100 Mcg Tabs (Levothyroxine Sodium) .Marland Kitchen.. 1 Tab By Mouth Daily  Allergies: 1)  !  Sulfa 2)  ! Zocor 3)  ! Pravachol 4)  ! * Niaspan 5)  ! Lipitor 6)  ! * Crestor 7)  ! * Wellchol  Review of Systems      See HPI CV:  Denies chest pain or discomfort and palpitations.  Physical Exam  General:  alert, well-developed, well-nourished, well-hydrated, and overweight-appearing.  NAD, weight down 1 pound since November.  Eyes:  No corneal or conjunctival inflammation noted. EOMI. Perrla. Funduscopic exam benign, without hemorrhages, exudates or papilledema. Vision grossly normal. Mouth:  Oral mucosa and oropharynx without lesions or exudates.  Teeth in good repair. Neck:  enlarged, non tender thyroid Lungs:  normal respiratory effort, no intercostal retractions, no accessory muscle use, and normal breath sounds.   Heart:  normal rate, regular rhythm, and no murmur.   Psych:  normally interactive and good eye contact.     Impression & Recommendations:  Problem # 1:  HYPERTHYROIDISM (ICD-242.90) Assessment Deteriorated Time spent with patient 25 minutes, more than 50% of this time was spent counseling patient on work up of hyperthyroidism and medication dosing.  TSH still very low.  Will decrease Synthroid to 100 micrograms.  Will aslo check full thyroid panel and send for  ultrasound given her issues with swallowing.  Pt in agreement with plan.  Orders: Radiology Referral (Radiology) Venipuncture [phone]) TLB-T3, Free (Triiodothyronine) (84481-T3FREE) TLB-T3 Uptake (84479-T3UP) TLB-T4 (Thyrox), Free [phone])  Complete Medication List: 1)  Altace 2.5 Mg Caps (Ramipril) .... Take one by mouth daily 2)  Fish Oil Oil (Fish oil) .... Take two by mouth two times a day as directed 3)  Flax Seed Oil Caps (Flaxseed (linseed) caps) .... Take two by mouth q am 4)  Aspirin 81 Mg Tbec (Aspirin) .... Take one by mouth daily 5)  Daily Vitamins/iron Tabs (Multiple vitamins-iron) .... Take by mouth daily as directed 6)  Onetouch Ultra Test Strp (Glucose blood) .... Use daily as  directed 7)  Caltrate 600+d Plus 600-400 Mg-unit Tabs (Calcium carbonate-vit d-min) .... Take 1 tablet by mouth once a day 8)  Metformin Hcl 500 Mg Tabs (Metformin hcl) .Marland Kitchen.. 1 by mouth two times a day 9)  Vitamin D 1000 Unit Caps (Cholecalciferol) .... Take one by mouth two times a day 10)  Zetia 10 Mg Tabs (Ezetimibe) .... Take one tablet by mouth daily. 11)  Synthroid 100 Mcg Tabs (Levothyroxine sodium) .Marland Kitchen.. 1 tab by mouth daily  Patient Instructions: 1)  Please stop by to see Shirlee Limerick on the way out to set up your ultrasound. 2)  Start taking your lower dose Syntrhoid (100 micrograms) daily. Prescriptions: SYNTHROID 100 MCG TABS (LEVOTHYROXINE SODIUM) 1 tab by mouth daily  #30 x 3   Entered and Authorized by:   Ruthe Mannan MD   Signed by:   Ruthe Mannan MD on 06/15/2009   Method used:   [facility]        [facility]  [address] ([facility])       [address]       [place], [place]  [address]       Ph: [phone] or [phone]       Fax: [phone]   RxID:   [id number]   Current Allergies (reviewed today): ! SULFA ! ZOCOR ! PRAVACHOL ! * NIASPAN ! LIPITOR ! * CRESTOR ! [facility]

## 2010-06-21 NOTE — Assessment & Plan Note (Signed)
Summary: rov/sp   Lipid Clinic Visit      The patient comes in today for dyslipidemia follow-up.  The patient has no complaints of medication problems, chest pain, or shortness of breath.  At the last visit we started fenofibrate 54mg .  She reports taking this for about a month but then started having muscle pains similar to the statins.  She stopped this about 2 weeks ago and the pain has resolved since that time.  Her current cholesterol therapy includes Zetia 10mg  daily and fish oil 4mg  daily.  She is intolerant to statins, niacin, Welchol and now fibrates.   Dietary compliance review reveals pt is starting a new diet since she saw her current lab results.  She has started doing weight watchers with two friends.  Unfortunately she is not able to go to the meetings because of her work schedule, but her friends are helping keep her current with the program.  She is limiting herself to 1200 cal/day and has started carrying her lunch to work with her.  She is also drinking about 1/4 glass of red wine at night.    Review of exercise habits reveals that the patient has not been exercising since last visit, but has started recently.  She is walking with her weight watchers friends about 3 miles every other day.  She is still wanting to get a treadmill so she will be able to continue this no matter what the weather.     Lipid Management Provider  Weston Brass, PharmD  Current Medications (verified): 1)  Altace 2.5 Mg Caps (Ramipril) .... Take One By Mouth Daily 2)  Fish Oil  Oil (Fish Oil) .... Take Two By Mouth Two Times A Day As Directed 3)  Flax Seed Oil  Caps (Flaxseed (Linseed) Caps) .... Take Two By Mouth Q Am 4)  Aspirin 81 Mg Tbec (Aspirin) .... Take One By Mouth Daily 5)  Daily Vitamins/iron  Tabs (Multiple Vitamins-Iron) .... Take By Mouth Daily As Directed 6)  Onetouch Ultra Test   Strp (Glucose Blood) .... Use Daily As Directed 7)  Caltrate 600+d Plus 600-400 Mg-Unit  Tabs (Calcium  Carbonate-Vit D-Min) .... Take 1 Tablet By Mouth Once A Day 8)  Metformin Hcl 500 Mg  Tabs (Metformin Hcl) .Marland Kitchen.. 1 By Mouth Two Times A Day 9)  Vitamin D 1000 Unit Caps (Cholecalciferol) .... Take One By Mouth Two Times A Day 10)  Zetia 10 Mg Tabs (Ezetimibe) .... Take One Tablet By Mouth Daily. 11)  Synthroid 100 Mcg Tabs (Levothyroxine Sodium) .Marland Kitchen.. 1 Tab By Mouth Daily  Allergies (verified): 1)  ! Sulfa 2)  ! Zocor 3)  ! Pravachol 4)  ! * Niaspan 5)  ! Lipitor 6)  ! * Crestor 7)  ! Noni Saupe  Past History:  Past Medical History: Last updated: 07/10/2007 Diabetes mellitus, type II Hyperlipidemia- with intol of all meds and no success at lipid clinic Hyperthyroidism- adv to hypothyroidism ? thyroid nodules Chronic pain and swelling of hard palate L foot/leg injury with ? torn muscle- 08    Vital Signs:  Patient profile:   60 year old female Weight:      177 pounds BMI:     30.25 BP sitting:   120 / 88 Cuff size:   regular  Impression & Recommendations:  Problem # 1:  HYPERLIPIDEMIA (ICD-272.4) Assessment Unchanged Pt's cholesterol remains elevated.  TC- 229 (goal<200), TG- 213 (goal<150), HDL- 37.2 (goal>45), and LDL- 172.5 (goal<70).  AST and ALT are  WNL.  Pt reported CBGs  ~ 125 recently.  Unfortantely she has tried every type of cholesterol medication and has been interolerant to everything but the zetia and fish oil.  She will have to work on diet and exercise to try to get numbers closer to goal.  Pt is aware of this.  She has a good support system for this diet plan. Encouraged her to find a way to make these changes habits so she will be able to stick with it for longer than a few weeks. Will f/u with pt in 4-5 months.  If no improvement, may consider dietary consult to help with nutritional counseling.    The following medications were removed from the medication list:    Fenofibrate 54 Mg Tabs (Fenofibrate) .Marland Kitchen... Take one tablet by mouth daily with a meal Her  updated medication list for this problem includes:    Zetia 10 Mg Tabs (Ezetimibe) .Marland Kitchen... Take one tablet by mouth daily.  Patient Instructions: 1)  Continue Zetia, fish oil, and flax seed oil 2)  Good Luck on starting weight watchers 3)  Continue to walk 3 miles most days of the week 4)  Lab Appt: 06/03/10 at 8 am at [facility] 5)  Lipid Clinic Appt: 06/09/2010 at 3:30 pm Prescriptions: ZETIA 10 MG TABS (EZETIMIBE) Take one tablet by mouth daily.  #30 x 6   Entered by:   Weston Brass PharmD   Authorized by:   Nathen May, MD, [facility]   Signed by:   Weston Brass PharmD on 01/13/2010   Method used:   Electronically to        [facility]  [address] (retail)       [address]       [place], [place]  [address]       Ph: [phone] or [phone]       Fax: [phone]   RxID:   [id number]

## 2010-06-21 NOTE — Assessment & Plan Note (Signed)
Summary: rov-tp   Joanna [name] is seen back in lipid clinic. She is doing well overall.  She has had some improvement in diet and continues to work on exercise.  She has been focused on improving her lifestyle therapies.   Lipid Management Provider  Shelby Dubin, PharmD, BCPS, CPP  Allergies (verified): 1)  ! Sulfa 2)  ! Zocor 3)  ! Pravachol 4)  ! * Niaspan 5)  ! Lipitor 6)  ! * Crestor 7)  ! Noni Saupe  Past History:  Past Medical History: Last updated: 07/10/2007 Diabetes mellitus, type II Hyperlipidemia- with intol of all meds and no success at lipid clinic Hyperthyroidism- adv to hypothyroidism ? thyroid nodules Chronic pain and swelling of hard palate L foot/leg injury with ? torn muscle- 08  Past Surgical History: Last updated: 06/28/2007 BTL 8/05 colonosc polyps hand pain 1/09--steriod injections--ortho  Family History: Last updated: 03/03/2008 cousin MI at 62  Father:  Mother:  Siblings:   DM- MI- CVA-  Prostate Cancer- Breast Cancer- Ovarian Cancer- Uterine Cancer- Colon Cancer- Drug/ ETOH Abuse- Depression-   Social History: Last updated: 03/03/2008 Marital Status: Married Children:  Occupation: office position--sits most of day  Risk Factors: Alcohol Use: 0 (04/22/2009) Diet: heart healthy, low carb (04/22/2009) Exercise: yes (04/22/2009)  Risk Factors: Smoking Status: never (04/22/2009) Passive Smoke Exposure: no (12/31/2007)  Family History: Reviewed history from 03/03/2008 and no changes required. cousin MI at 33  Father:  Mother:  Siblings:   DM- MI- CVA-  Prostate Cancer- Breast Cancer- Ovarian Cancer- Uterine Cancer- Colon Cancer- Drug/ ETOH Abuse- Depression-   Social History: Reviewed history from 03/03/2008 and no changes required. Marital Status: Married Children:  Occupation: office position--sits most of day   Vital Signs:  Patient profile:   60 year old female Weight:      176 pounds Pulse rate:   72  / minute BP sitting:   110 / 76  Impression & Recommendations:  Problem # 1:  HYPERLIPIDEMIA (ICD-272.4) Pt has labs that demonstrate:  LDL 170, triglycerides 190, HDL 43, LFTs are within normal limits except ALT 62.  We have discussed that triglycerides may be increased when thyroid function / replacement are not equalized (euthyroid).  Joanna [name] agrees to follow-up with primary care regarding this issue.  Statins are not options for her due to feeling bad previously on therapy.  We have discussed increased dietary intervention, and although welchol has not been tolerable previously due to tablet burden, I wonder if the new packet product formulation might be a better option.  I do not have samples, but will try to obtain for her from the company.  I have suggested lipid follow-up with labs and appointment in 3 - 4 months.  I appreciate the opportunity to see Joanna [name].    Her updated medication list for this problem includes:    Zetia 10 Mg Tabs (Ezetimibe) .Marland Kitchen... Take one tablet by mouth daily.

## 2010-06-23 NOTE — Assessment & Plan Note (Signed)
Summary: discuss treatment options/alc   Vital Signs:  Patient profile:   60 year old female Height:      64.25 inches Weight:      175.25 pounds BMI:     29.96 Temp:     97.7 degrees F oral Pulse rate:   77 / minute Pulse rhythm:   regular BP sitting:   120 / 82  (right arm) Cuff size:   regular  Vitals Entered By: Linde Gillis CMA Duncan Dull) (June 10, 2010 12:22 PM) CC: discuss treatment options   History of Present Illness: 60 yo pt here for follow up HLD and DM.    HLD- goes to lipid clinic due to elevated lipids and intolerance to meds. Started exercising and loosing weight, now on [employer] and has lost 5 pounds in 2 weeks.  Thought she no longer needed to take her cholesterol medication. This month, LDL 220!, HDL 43, TG 223.  Just restarted her Zetia 10 mg daily and Fenofibrate 54 mg daily yesterday.  Has follow up scheduled with lipid clinic in March.  Very intolerant to statins.  DM- deteriorated as well.  She is taking her Metormin 500 mg bid but admits to not eating well over the holidays.  a1c this month 7.3 (was 6.8 and 6.3 prior to that).  CBGs over past two weeks running between low 100s-130s.    Current Medications (verified): 1)  Altace 2.5 Mg Caps (Ramipril) .... Take One By Mouth Daily 2)  Fish Oil  Oil (Fish Oil) .... Take Two By Mouth Two Times A Day As Directed 3)  Flax Seed Oil  Caps (Flaxseed (Linseed) Caps) .... Take Two By Mouth Q Am 4)  Aspirin 81 Mg Tbec (Aspirin) .... Take One By Mouth Daily 5)  Daily Vitamins/iron  Tabs (Multiple Vitamins-Iron) .... Take By Mouth Daily As Directed 6)  Onetouch Ultra Test   Strp (Glucose Blood) .... Use Daily As Directed 7)  Caltrate 600+d Plus 600-400 Mg-Unit  Tabs (Calcium Carbonate-Vit D-Min) .... Take 1 Tablet By Mouth Once A Day 8)  Metformin Hcl 500 Mg  Tabs (Metformin Hcl) .Marland Kitchen.. 1 By Mouth Two Times A Day 9)  Vitamin D 1000 Unit Caps (Cholecalciferol) .... Take One By Mouth Two Times A Day 10)  Zetia  10 Mg Tabs (Ezetimibe) .... Take One Tablet By Mouth Daily. 11)  Synthroid 100 Mcg Tabs (Levothyroxine Sodium) .Marland Kitchen.. 1 Tab By Mouth Daily 12)  Fenofibrate 54 Mg Tabs (Fenofibrate) .Marland Kitchen.. 1 Tab By Mouth Daily.  Allergies: 1)  ! Sulfa 2)  ! Zocor 3)  ! Pravachol 4)  ! * Niaspan 5)  ! Lipitor 6)  ! * Crestor 7)  ! Noni Saupe  Past History:  Past Medical History: Last updated: 07/10/2007 Diabetes mellitus, type II Hyperlipidemia- with intol of all meds and no success at lipid clinic Hyperthyroidism- adv to hypothyroidism ? thyroid nodules Chronic pain and swelling of hard palate L foot/leg injury with ? torn muscle- 08  Past Surgical History: Last updated: 06/28/2007 BTL 8/05 colonosc polyps hand pain 1/09--steriod injections--ortho  Family History: Last updated: 03/03/2008 cousin MI at 16  Father:  Mother:  Siblings:   DM- MI- CVA-  Prostate Cancer- Breast Cancer- Ovarian Cancer- Uterine Cancer- Colon Cancer- Drug/ ETOH Abuse- Depression-   Social History: Last updated: 03/03/2008 Marital Status: Married Children:  Occupation: office position--sits most of day  Risk Factors: Alcohol Use: 0 (04/22/2009) Diet: heart healthy, low carb (04/22/2009) Exercise: yes (04/22/2009)  Risk Factors:  Smoking Status: never (04/22/2009) Passive Smoke Exposure: no (12/31/2007)  Review of Systems      See HPI General:  Denies malaise. Eyes:  Denies blurring. CV:  Denies chest pain or discomfort. Resp:  Denies shortness of breath. Endo:  Denies excessive thirst and excessive urination.  Physical Exam  General:  alert, well-developed, well-nourished, well-hydrated, and overweight-appearing.    Psych:  normally interactive and good eye contact.     Impression & Recommendations:  Problem # 1:  HYPERLIPIDEMIA (ICD-272.4) Assessment Deteriorated Time spent with patient 25 minutes, more than 50% of this time was spent counseling patient on importance of bringing her  cholesterol and DM back under control.  She plans to take her medication daily, continue exercising and eating right. She will keep appt with lipid clinic in march.  Her updated medication list for this problem includes:    Zetia 10 Mg Tabs (Ezetimibe) .Marland Kitchen... Take one tablet by mouth daily.    Fenofibrate 54 Mg Tabs (Fenofibrate) .Marland Kitchen... 1 tab by mouth daily.  Problem # 2:  DIABETES MELLITUS, TYPE II (ICD-250.00) Assessment: Deteriorated likely impacted by her increased TG.  Will not adjust medication at this time due to changes we are making (see #1).  recheck a1c in March with her lipid panel. Her updated medication list for this problem includes:    Altace 2.5 Mg Caps (Ramipril) .Marland Kitchen... Take one by mouth daily    Aspirin 81 Mg Tbec (Aspirin) .Marland Kitchen... Take one by mouth daily    Metformin Hcl 500 Mg Tabs (Metformin hcl) .Marland Kitchen... 1 by mouth two times a day  Complete Medication List: 1)  Altace 2.5 Mg Caps (Ramipril) .... Take one by mouth daily 2)  Fish Oil Oil (Fish oil) .... Take two by mouth two times a day as directed 3)  Flax Seed Oil Caps (Flaxseed (linseed) caps) .... Take two by mouth q am 4)  Aspirin 81 Mg Tbec (Aspirin) .... Take one by mouth daily 5)  Daily Vitamins/iron Tabs (Multiple vitamins-iron) .... Take by mouth daily as directed 6)  Onetouch Ultra Test Strp (Glucose blood) .... Use daily as directed 7)  Caltrate 600+d Plus 600-400 Mg-unit Tabs (Calcium carbonate-vit d-min) .... Take 1 tablet by mouth once a day 8)  Metformin Hcl 500 Mg Tabs (Metformin hcl) .Marland Kitchen.. 1 by mouth two times a day 9)  Vitamin D 1000 Unit Caps (Cholecalciferol) .... Take one by mouth two times a day 10)  Zetia 10 Mg Tabs (Ezetimibe) .... Take one tablet by mouth daily. 11)  Synthroid 100 Mcg Tabs (Levothyroxine sodium) .Marland Kitchen.. 1 tab by mouth daily 12)  Fenofibrate 54 Mg Tabs (Fenofibrate) .Marland Kitchen.. 1 tab by mouth daily.   Orders Added: 1)  Est. Patient Level IV [[address]]    Current Allergies (reviewed  today): ! SULFA ! ZOCOR ! PRAVACHOL ! * NIASPAN ! LIPITOR ! * CRESTOR ! [facility]

## 2010-06-23 NOTE — Progress Notes (Signed)
----   Converted from flag ---- ---- 06/03/2010 10:38 AM, Ruthe Mannan MD wrote: yes ok to add  ---- 06/03/2010 8:19 AM, Liane Comber CMA (AAMA) wrote: Peri Jefferson Morning ,Pt had labs this morning (lipid,hepatic) she wants to make sure thyroid and blood sugar are checked as well, is it ok to add? Thanks Tasha ------------------------------

## 2010-06-24 ENCOUNTER — Encounter: Payer: Self-pay | Admitting: Internal Medicine

## 2010-08-04 ENCOUNTER — Ambulatory Visit: Payer: Self-pay

## 2010-08-09 NOTE — Letter (Addendum)
Summary: [facility] Summary  [facility] Summary   Imported By: Earl Many 07/28/2010 17:08:27  _____________________________________________________________________  External Attachment:    Type:   Image     Comment:   External Document _____________________________________________________________________  External Attachment:    Type:   Image     Comment:   External Document

## 2010-09-05 ENCOUNTER — Other Ambulatory Visit: Payer: Self-pay | Admitting: Family Medicine

## 2010-09-05 DIAGNOSIS — E119 Type 2 diabetes mellitus without complications: Secondary | ICD-10-CM

## 2010-09-05 DIAGNOSIS — E0590 Thyrotoxicosis, unspecified without thyrotoxic crisis or storm: Secondary | ICD-10-CM

## 2010-09-05 DIAGNOSIS — E785 Hyperlipidemia, unspecified: Secondary | ICD-10-CM

## 2010-09-14 ENCOUNTER — Other Ambulatory Visit (INDEPENDENT_AMBULATORY_CARE_PROVIDER_SITE_OTHER): Payer: 59 | Admitting: Family Medicine

## 2010-09-14 DIAGNOSIS — E0590 Thyrotoxicosis, unspecified without thyrotoxic crisis or storm: Secondary | ICD-10-CM

## 2010-09-14 DIAGNOSIS — E119 Type 2 diabetes mellitus without complications: Secondary | ICD-10-CM

## 2010-09-14 DIAGNOSIS — Z0000 Encounter for general adult medical examination without abnormal findings: Secondary | ICD-10-CM

## 2010-09-14 DIAGNOSIS — E785 Hyperlipidemia, unspecified: Secondary | ICD-10-CM

## 2010-09-14 LAB — BASIC METABOLIC PANEL
BUN: 13 mg/dL (ref 6–23)
CO2: 30 mEq/L (ref 19–32)
Calcium: 10 mg/dL (ref 8.4–10.5)
Chloride: 102 mEq/L (ref 96–112)
Creatinine, Ser: 0.8 mg/dL (ref 0.4–1.2)
GFR: 81.84 mL/min (ref >60.00)
Glucose, Bld: 109 mg/dL — ABNORMAL HIGH (ref 70–99)
Potassium: 4.9 mEq/L (ref 3.5–5.1)
Sodium: 140 mEq/L (ref 135–145)

## 2010-09-14 LAB — HEMOGLOBIN A1C: Hgb A1c MFr Bld: 7 % — ABNORMAL HIGH (ref 4.6–6.5)

## 2010-09-14 LAB — LIPID PANEL
Total CHOL/HDL Ratio: 6
VLDL: 38.6 mg/dL (ref 0.0–40.0)

## 2010-09-15 LAB — VITAMIN D 25 HYDROXY (VIT D DEFICIENCY, FRACTURES): Vit D, 25-Hydroxy: 54 ng/mL (ref 30–89)

## 2010-09-19 ENCOUNTER — Encounter: Payer: Self-pay | Admitting: Family Medicine

## 2010-09-20 ENCOUNTER — Ambulatory Visit (INDEPENDENT_AMBULATORY_CARE_PROVIDER_SITE_OTHER): Payer: 59 | Admitting: Family Medicine

## 2010-09-20 ENCOUNTER — Encounter: Payer: Self-pay | Admitting: Family Medicine

## 2010-09-20 VITALS — BP 130/90 | HR 74 | Temp 97.7°F | Ht 64.0 in | Wt 177.0 lb

## 2010-09-20 DIAGNOSIS — IMO0002 Reserved for concepts with insufficient information to code with codable children: Secondary | ICD-10-CM

## 2010-09-20 DIAGNOSIS — E0590 Thyrotoxicosis, unspecified without thyrotoxic crisis or storm: Secondary | ICD-10-CM

## 2010-09-20 DIAGNOSIS — E119 Type 2 diabetes mellitus without complications: Secondary | ICD-10-CM

## 2010-09-20 DIAGNOSIS — I10 Essential (primary) hypertension: Secondary | ICD-10-CM

## 2010-09-20 DIAGNOSIS — L02413 Cutaneous abscess of right upper limb: Secondary | ICD-10-CM | POA: Insufficient documentation

## 2010-09-20 DIAGNOSIS — E785 Hyperlipidemia, unspecified: Secondary | ICD-10-CM

## 2010-09-20 MED ORDER — DOXYCYCLINE HYCLATE 100 MG PO TABS: 100.0000 mg | ORAL_TABLET | Freq: Two times a day (BID) | ORAL | Status: AC

## 2010-09-20 NOTE — Patient Instructions (Signed)
Please stop by to see [facility].

## 2010-09-20 NOTE — Assessment & Plan Note (Signed)
Deteriorated. Place on Doxy 100 mg twice daily x 10 days (sulfa allergic). Referral placed for derm as it likely needs to be removed.

## 2010-09-20 NOTE — Assessment & Plan Note (Signed)
Improved. Continue Synthroid 100 mcg daily.

## 2010-09-20 NOTE — Progress Notes (Signed)
60 yo pt here for follow up HLD and DM.    HLD- goes to lipid clinic due to elevated lipids and intolerance to meds. Improved. Very intolerant to statins. Lab Results  Component Value Date   CHOL 253* 09/14/2010   CHOL 279* 06/03/2010   CHOL 229* 01/04/2010   Lab Results  Component Value Date   HDL 44.50 09/14/2010   HDL 61.60 06/03/2010   HDL 73.71* 01/04/2010   No results found for this basename: [facility]   Lab Results  Component Value Date   TRIG 193.0* 09/14/2010   TRIG 223.0* 06/03/2010   TRIG 213.0* 01/04/2010   Lab Results  Component Value Date   CHOLHDL 6 09/14/2010   CHOLHDL 6 06/03/2010   CHOLHDL 6 01/04/2010    Lab Results  Component Value Date   ALT 53* 06/03/2010   AST 33 06/03/2010   ALKPHOS 56 06/03/2010   BILITOT 1.0 06/03/2010   On Zetia 10 mg daily and Fenofibrate 54 mg daily. Walking more.   DM- improved. Lab Results  Component Value Date   HGBA1C 7.0* 09/14/2010  On Metformin 500 mg twice daily Admits to not checking sugars regularly.  Abscess on right arm- has been there for years and told it needed to removed eventually. Gets larger and smaller, often drains and is erythematous. Allergic to sulfa.  The PMH, PSH, Social History, Family History, Medications, and allergies have been reviewed in [facility], and have been updated if relevant.   Review of Systems       See HPI General:  Denies malaise. Eyes:  Denies blurring. CV:  Denies chest pain or discomfort. Resp:  Denies shortness of breath. Endo:  Denies excessive thirst and excessive urination.  Physical Exam BP 130/90  Pulse 74  Temp(Src) 97.7 F (36.5 C) (Oral)  Ht 5\' 4"  (1.626 m)  Wt 177 lb (80.287 kg)  BMI 30.38 kg/m2  General:  alert, well-developed, well-nourished, well-hydrated, and overweight-appearing.   Skin:  3 cm erythematous, non fluctuant abscess.  Not warm to touch. Psych:  normally interactive and good eye contact.

## 2010-09-20 NOTE — Assessment & Plan Note (Signed)
IMproved but LDL remains elevated. Continue working on diet and exercise, continue following up with lipid clinic.

## 2010-09-20 NOTE — Assessment & Plan Note (Signed)
Improved. Continue diet and current dose of Metformin.

## 2010-09-22 ENCOUNTER — Ambulatory Visit: Payer: Self-pay

## 2010-10-07 NOTE — Procedures (Signed)
Joanna [name], Joanna [name]                  ACCOUNT NO.:  [id number]   MEDICAL RECORD NO.:  [id number]          PATIENT TYPE:  OUT   LOCATION:  SLEEP CENTER                 FACILITY[facility]  [facility]   PHYSICIAN:  Clinton D. Maple Hudson, M.D. DATE OF BIRTH:  10-04-1950   DATE OF STUDY:                              NOCTURNAL POLYSOMNOGRAM   REFERRING PHYSICIAN:  Dr. Hermelinda Medicus.   INDICATIONS FOR STUDY:  Hypersomnia with sleep apnea.  Epworth sleepiness  score 24/24, BMI 30.7.  Weight 180 pounds.   HOME MEDICATIONS:  Synthroid, Zoloft, multivitamins.   Sleep architecture:  Total sleep time 318 minutes with sleep efficiency 74%.  Stage I was 16%, stage II 62%, stages III and IV absent, REM 22% of total  sleep time.  Sleep latency 5 minutes, REM latency 98 minutes, awake after  sleep onset 105 minutes, arousal index increased at 41.  No bedtime  medication taken.   Respiratory data:  Split study protocol.  Apnea/hypopnea index (AHI, RDI)  61.5 obstructive events per hour indicating severe obstructive sleep  apnea/hypopnea syndrome.  This included 30 obstructive apneas and 112  hypopneas before CPAP.  Events were more common while supine but also  significantly present while sleeping on left side.  REM AHI of 41.1 per  hour.  CPAP was titrated to 19 CWP, AHI  4.9 per hour.  A medium Respironics ComfortGel nasal mask was used with  heated humidifier and chin strap.   Oxygen data:  Moderate to loud snoring subsequently prevented by CPAP.  Oxygen desaturation before CPAP to a nadir of 74%.  Oxygen saturation with  CPAP was 95% on room air.   Cardiac data:  Normal sinus rhythm.   Movement/parasomnia:  A total of 86 limb jerks were recorded of which 19  were associated with arousal or awakening for periodic limb movement with  arousal index of 3.6 per hour which is mildly increased.  Bathroom times  one.   IMPRESSION/RECOMMENDATIONS:  1.  Severe obstructive sleep apnea/hypopnea syndrome, AHI 61.5 per  hour with      events more common while supine and in REM.  Moderate to loud snoring      with oxygen desaturation to 74%.  2.  Successful CPAP titration to CWP, AHI 4.9 per hour.  A medium      Respironics ComfortGel nasal mask was      used with heated humidifier and chin strap.  3.  Mild periodic limb movement with arousal, 3.6 per hour.      Clinton D. Maple Hudson, M.D.  Diplomate, Biomedical engineer of Sleep Medicine  Electronically Signed     CDY/MEDQ  D:  09/03/2005 15:46:13  T:  09/04/2005 09:01:41  Job:  [id number]

## 2010-11-29 ENCOUNTER — Other Ambulatory Visit: Payer: Self-pay | Admitting: Family Medicine

## 2011-02-13 ENCOUNTER — Other Ambulatory Visit: Payer: Self-pay | Admitting: *Deleted

## 2011-02-13 MED ORDER — LEVOTHYROXINE SODIUM 100 MCG PO TABS: 100.0000 ug | ORAL_TABLET | Freq: Every day | ORAL | Status: DC

## 2011-02-23 ENCOUNTER — Encounter: Payer: Self-pay | Admitting: Family Medicine

## 2011-02-23 ENCOUNTER — Ambulatory Visit: Payer: 59 | Admitting: Family Medicine

## 2011-02-23 ENCOUNTER — Ambulatory Visit (INDEPENDENT_AMBULATORY_CARE_PROVIDER_SITE_OTHER): Payer: 59 | Admitting: Family Medicine

## 2011-02-23 VITALS — BP 130/90 | HR 73 | Temp 98.0°F | Ht 64.0 in | Wt 166.8 lb

## 2011-02-23 DIAGNOSIS — F419 Anxiety disorder, unspecified: Secondary | ICD-10-CM | POA: Insufficient documentation

## 2011-02-23 DIAGNOSIS — F411 Generalized anxiety disorder: Secondary | ICD-10-CM

## 2011-02-23 MED ORDER — BUSPIRONE HCL 15 MG PO TABS: ORAL_TABLET | ORAL | Status: DC

## 2011-02-23 NOTE — Patient Instructions (Signed)
Good to see you. Please call me in 3-4 weeks with an update of your symptoms. Hang in there.

## 2011-02-23 NOTE — Progress Notes (Signed)
60 yo pt here to discuss anxiety.  Increased stressors at work, transitioning to paper less office. Father's health is getting worse. Noticed she is more tearful and anxious at times. Difficulty sleeping when she thinks about work- melatonin seems to help.  Appetite good- has been intentionally loosing weight with diet and exercise. Wt Readings from Last 3 Encounters:  02/23/11 166 lb 12 oz (75.637 kg)  09/20/10 177 lb (80.287 kg)  06/10/10 175 lb 4 oz (79.493 kg)   Feels very snappy with her kids and husband. Denies panic attacks.  No SI or HI.  Took Zoloft in past, made her gain weight and had sexual side effects.  Patient Active Problem List  Diagnoses  . HYPERTHYROIDISM  . DIABETES MELLITUS, TYPE II  . HYPERLIPIDEMIA  . HYPERTENSION  . DERMATITIS, CONTACT, NOS  . SLEEP APNEA  . SKIN RASH  . HYPERGLYCEMIA  . Abscess of right arm  . Anxiety   Past Medical History  Diagnosis Date  . Diabetes mellitus     type II  . Hyperlipidemia   . Hyperthyroidism     ??thyroid nodules   . Foot injury 08    Left foot/leg with ?? torn muscle   . Hand pain 1/09    steroid injections  . Hx of colonic polyps 8/05   No past surgical history on file. History  Substance Use Topics  . Smoking status: Never Smoker   . Smokeless tobacco: Not on file  . Alcohol Use: Not on file   No family history on file. Allergies  Allergen Reactions  . Atorvastatin     REACTION: Hot flashes and flu-like symptoms  . Niacin     REACTION: n/v  . Pravastatin Sodium     REACTION: Neck swelling and pain in her shoulders and arms.  . Rosuvastatin     REACTION: N/V and heartburn  . Simvastatin     REACTION: GI  . Sulfonamide Derivatives    Current Outpatient Prescriptions on File Prior to Visit  Medication Sig Dispense Refill  . aspirin 81 MG tablet Take 81 mg by mouth daily.        . Calcium Carbonate-Vitamin D (CALTRATE 600+D PO) Take by mouth.        . Calcium Carbonate-Vitamin D  (CALTRATE 600+D) 600-400 MG-UNIT per tablet Take 1 tablet by mouth daily.        . Cholecalciferol (VITAMIN D) 1000 UNITS capsule Take 1,000 Units by mouth daily. Take one by mouth two times a day       . glucose blood test strip 1 each by Other route as needed. Use as instructed       . levothyroxine (SYNTHROID) 100 MCG tablet Take 1 tablet (100 mcg total) by mouth daily.  90 tablet  2  . metFORMIN (GLUCOPHAGE) 500 MG tablet TAKE 1 TABLET TWICE A DAY  180 tablet  2  . Multiple Vitamins-Iron (DAILY VITAMINS/IRON) TABS Take by mouth.        . ramipril (ALTACE) 2.5 MG capsule Take 2.5 mg by mouth daily.        . vitamin B-12 (CYANOCOBALAMIN) 250 MCG tablet Take 250 mcg by mouth daily.         The PMH, PSH, Social History, Family History, Medications, and allergies have been reviewed in [facility], and have been updated if relevant.  The PMH, PSH, Social History, Family History, Medications, and allergies have been reviewed in [facility], and have been updated if relevant.   Review of  Systems       See HPI   Physical Exam BP 130/90  Pulse 73  Temp(Src) 98 F (36.7 C) (Oral)  Ht 5\' 4"  (1.626 m)  Wt 166 lb 12 oz (75.637 kg)  BMI 28.62 kg/m2  General:  alert, well-developed, well-nourished, well-hydrated, and overweight-appearing.   Psych:  normally interactive and good eye contact.    Assessment and Plan: 1. Anxiety   Deteriorated. >25 min spent with face to face with patient, >50% counseling and/or coordinating care. Will start Buspar 15 mg twice daily (take 7.5 mg twice daily for first week to minimize side effects). Follow up in 3-4 weeks. The patient indicates understanding of these issues and agrees with the plan.

## 2011-05-12 ENCOUNTER — Telehealth: Payer: Self-pay | Admitting: Internal Medicine

## 2011-05-12 NOTE — Telephone Encounter (Signed)
I would really need to see her in order to call in abx and to start xanax. Please put her on schedule for Monday. Thanks

## 2011-05-12 NOTE — Telephone Encounter (Signed)
Patient called and stated she is broke out again on her bottom and wanted to know if you could refill her antibiotic Doxycycline.  Patient also stated she can't take the Buspar because it made her cry all the time, and wanted to know if you could call in xanax for her because she doesn't need the medication all the time.

## 2011-05-12 NOTE — Telephone Encounter (Signed)
Patient advised as instructed via telephone, appt times were offered to her for Monday and she refused.  She stated that she has company coming in on Christmas Eve and can't make the appt.

## 2011-05-25 ENCOUNTER — Encounter: Payer: Self-pay | Admitting: Internal Medicine

## 2011-06-09 ENCOUNTER — Other Ambulatory Visit: Payer: Self-pay | Admitting: *Deleted

## 2011-06-09 MED ORDER — GLUCOSE BLOOD VI STRP: ORAL_STRIP | Status: DC

## 2011-06-15 ENCOUNTER — Encounter: Payer: Self-pay | Admitting: Internal Medicine

## 2011-06-16 ENCOUNTER — Ambulatory Visit: Payer: 59 | Admitting: Internal Medicine

## 2011-07-21 ENCOUNTER — Telehealth: Payer: Self-pay | Admitting: Family Medicine

## 2011-07-21 MED ORDER — VALACYCLOVIR HCL 1 G PO TABS: 1000.0000 mg | ORAL_TABLET | Freq: Three times a day (TID) | ORAL | Status: AC

## 2011-07-21 NOTE — Telephone Encounter (Signed)
Pt states she has had shingles twice before and now again has a rash under her left breast, since yesterday.  She is having twinges of pain, like she had when she had shingles before.  She is asking if something can be called to [facility].

## 2011-07-21 NOTE — Telephone Encounter (Signed)
Pt is calling about Shingles breakout. She has had shingles before and was treated with medication. She is going out of town this afternoon and was wondering if she can get the medication again [facility] [address]

## 2011-07-21 NOTE — Telephone Encounter (Signed)
Advised patient

## 2011-07-21 NOTE — Telephone Encounter (Signed)
I don;t typically send in rx without evaluating pt but since she is going out of town, will [facility] in [facility]

## 2011-08-30 ENCOUNTER — Ambulatory Visit (INDEPENDENT_AMBULATORY_CARE_PROVIDER_SITE_OTHER): Payer: 59 | Admitting: Family Medicine

## 2011-08-30 ENCOUNTER — Encounter: Payer: Self-pay | Admitting: Family Medicine

## 2011-08-30 VITALS — BP 110/72 | HR 60 | Temp 97.8°F | Wt 171.5 lb

## 2011-08-30 DIAGNOSIS — E538 Deficiency of other specified B group vitamins: Secondary | ICD-10-CM

## 2011-08-30 DIAGNOSIS — E0590 Thyrotoxicosis, unspecified without thyrotoxic crisis or storm: Secondary | ICD-10-CM

## 2011-08-30 DIAGNOSIS — I10 Essential (primary) hypertension: Secondary | ICD-10-CM

## 2011-08-30 DIAGNOSIS — E785 Hyperlipidemia, unspecified: Secondary | ICD-10-CM

## 2011-08-30 DIAGNOSIS — E119 Type 2 diabetes mellitus without complications: Secondary | ICD-10-CM

## 2011-08-30 LAB — COMPREHENSIVE METABOLIC PANEL
ALT: 29 U/L (ref 0–35)
AST: 19 U/L (ref 0–37)
Alkaline Phosphatase: 58 U/L (ref 39–117)
CO2: 29 mEq/L (ref 19–32)
Sodium: 140 mEq/L (ref 135–145)
Total Bilirubin: 0.7 mg/dL (ref 0.3–1.2)
Total Protein: 7.5 g/dL (ref 6.0–8.3)

## 2011-08-30 LAB — LIPID PANEL
HDL: 42 mg/dL (ref >39.00)
Total CHOL/HDL Ratio: 9
VLDL: 35 mg/dL (ref 0.0–40.0)

## 2011-08-30 LAB — TSH: TSH: 16.97 u[IU]/mL — ABNORMAL HIGH (ref 0.35–5.50)

## 2011-08-30 LAB — VITAMIN B12: Vitamin B-12: 591 pg/mL (ref 211–911)

## 2011-08-30 MED ORDER — HYDROCORTISONE ACETATE 25 MG RE SUPP: 25.0000 mg | Freq: Two times a day (BID) | RECTAL | Status: AC

## 2011-08-30 NOTE — Progress Notes (Signed)
61 yo pt here for follow up.  HLD- goes to lipid clinic due to elevated lipids and intolerance to meds. Improved. Very intolerant to statins. Lab Results  Component Value Date   CHOL 253* 09/14/2010   CHOL 279* 06/03/2010   CHOL 229* 01/04/2010   Lab Results  Component Value Date   HDL 44.50 09/14/2010   HDL 45.40 06/03/2010   HDL 98.11* 01/04/2010   No results found for this basename: [facility]   Lab Results  Component Value Date   TRIG 193.0* 09/14/2010   TRIG 223.0* 06/03/2010   TRIG 213.0* 01/04/2010   Lab Results  Component Value Date   CHOLHDL 6 09/14/2010   CHOLHDL 6 06/03/2010   CHOLHDL 6 01/04/2010    Lab Results  Component Value Date   ALT 53* 06/03/2010   AST 33 06/03/2010   ALKPHOS 56 06/03/2010   BILITOT 1.0 06/03/2010   On Zetia 10 mg daily and Fenofibrate 54 mg daily. Walking more.   DM- improved. Lab Results  Component Value Date   HGBA1C 7.0* 09/14/2010  On Metformin 500 mg twice daily Admits to not checking sugars regularly.  Thyroid dysfunction- denies any symptoms of hypo or hyperthyroidism. Lab Results  Component Value Date   TSH 5.08 09/14/2010   Patient Active Problem List  Diagnoses  . HYPERTHYROIDISM  . DIABETES MELLITUS, TYPE II  . HYPERLIPIDEMIA  . HYPERTENSION  . DERMATITIS, CONTACT, NOS  . SLEEP APNEA  . SKIN RASH  . HYPERGLYCEMIA  . Abscess of right arm  . Anxiety   Past Medical History  Diagnosis Date  . Diabetes mellitus     type II  . Hyperlipidemia   . Hyperthyroidism     ??thyroid nodules   . Foot injury 08    Left foot/leg with ?? torn muscle   . Hand pain 1/09    steroid injections  . Adenomatous colon polyp   . Internal hemorrhoids   . IBS (irritable bowel syndrome)   . Depression   . Panic disorder    Past Surgical History  Procedure Date  . Colonoscopy w/ biopsies and polypectomy 05/19/2005    adenomatous polyps   History  Substance Use Topics  . Smoking status: Never Smoker   . Smokeless tobacco: Never  Used  . Alcohol Use: No   No family history on file. Allergies  Allergen Reactions  . Atorvastatin     REACTION: Hot flashes and flu-like symptoms  . Niacin     REACTION: n/v  . Pravastatin Sodium     REACTION: Neck swelling and pain in her shoulders and arms.  . Rosuvastatin     REACTION: N/V and heartburn  . Simvastatin     REACTION: GI  . Sulfonamide Derivatives    Current Outpatient Prescriptions on File Prior to Visit  Medication Sig Dispense Refill  . aspirin 81 MG tablet Take 81 mg by mouth daily.        Marland Kitchen glucose blood test strip Use to check blood sugar up to two times daily  100 each  12  . metFORMIN (GLUCOPHAGE) 500 MG tablet TAKE 1 TABLET TWICE A DAY  180 tablet  2  . ramipril (ALTACE) 2.5 MG capsule Take 2.5 mg by mouth daily.        . busPIRone (BUSPAR) 15 MG tablet 1/2 tab by mouth twice daily for 1 week, then increase to 1 tab twice daily.  60 tablet  1  . Calcium Carbonate-Vitamin D (CALTRATE 600+D PO) Take  by mouth.        . Calcium Carbonate-Vitamin D (CALTRATE 600+D) 600-400 MG-UNIT per tablet Take 1 tablet by mouth daily.        . Cholecalciferol (VITAMIN D) 1000 UNITS capsule Take 1,000 Units by mouth daily. Take one by mouth two times a day       . levothyroxine (SYNTHROID) 100 MCG tablet Take 1 tablet (100 mcg total) by mouth daily.  90 tablet  2  . Multiple Vitamins-Iron (DAILY VITAMINS/IRON) TABS Take by mouth.        . valACYclovir (VALTREX) 1000 MG tablet Take 1 tablet (1,000 mg total) by mouth 3 (three) times daily.  21 tablet  0  . vitamin B-12 (CYANOCOBALAMIN) 250 MCG tablet Take 250 mcg by mouth daily.           The PMH, PSH, Social History, Family History, Medications, and allergies have been reviewed in [facility], and have been updated if relevant.   Review of Systems       See HPI General:  Denies malaise. Eyes:  Denies blurring. CV:  Denies chest pain or discomfort. Resp:  Denies shortness of breath. Endo:  Denies excessive thirst and  excessive urination.  Physical Exam BP 110/72  Pulse 60  Temp(Src) 97.8 F (36.6 C) (Oral)  Wt 171 lb 8 oz (77.792 kg)  General:  Well-developed,well-nourished,in no acute distress; alert,appropriate and cooperative throughout examination Head:  normocephalic and atraumatic.   Eyes:  vision grossly intact, pupils equal, pupils round, and pupils reactive to light.   Ears:  R ear normal and L ear normal.   Lungs:  Normal respiratory effort, chest expands symmetrically. Lungs are clear to auscultation, no crackles or wheezes. Heart:  Normal rate and regular rhythm. S1 and S2 normal without gallop, murmur, click, rub or other extra sounds. Abdomen:  Bowel sounds positive,abdomen soft and non-tender without masses, organomegaly or hernias noted. Msk:  No deformity or scoliosis noted of thoracic or lumbar spine.   Extremities:  No clubbing, cyanosis, edema, or deformity noted with normal full range of motion of all joints.   Neurologic:  alert & oriented X3 and gait normal.   Skin:  Intact without suspicious lesions or rashes Psych:  Cognition and judgment appear intact. Alert and cooperative with normal attention span and concentration. No apparent delusions, illusions, hallucinations  Assessment and Plan: 1. HYPERTHYROIDISM  Stable, recheck labs today. TSH  2. HYPERTENSION  Stable. Comprehensive metabolic panel [[facility]]  3. HYPERLIPIDEMIA  Lipid Panel  4. DIABETES MELLITUS, TYPE II  Stable. Hemoglobin A1c  5. Vitamin B12 deficiency  Vitamin B12

## 2011-08-30 NOTE — Patient Instructions (Signed)
Good to see you. Please call back to let us know where to send your medications.

## 2011-09-07 ENCOUNTER — Other Ambulatory Visit: Payer: Self-pay

## 2011-09-07 MED ORDER — LEVOTHYROXINE SODIUM 100 MCG PO TABS: 100.0000 ug | ORAL_TABLET | Freq: Every day | ORAL | Status: DC

## 2011-09-07 NOTE — Telephone Encounter (Signed)
Pt left v/m that she had spoken with Dr Elmer Sow nurse on Mon 09/04/11 and did not want to increase Synthroid and thought refill was to be sent to [facility]. Pt saw Dr Dayton Martes on 08/30/11. Reviewed lab results and was not sure if Dr Dayton Martes was going to increase Synthroid or not. Please advise. Pt can be reached at [phone].

## 2011-10-27 ENCOUNTER — Telehealth: Payer: Self-pay | Admitting: Family Medicine

## 2011-10-27 NOTE — Telephone Encounter (Signed)
Caller: Joanna [name]/Patient; PCP: Ruthe Mannan (Nestor Ramp); CB#: [phone]; Call for antibiotics for Dime Sized Boil On Rectum; Onset 10/23/11.  Afebrile.  Advised to see MD within 4 hrs for new onset in perineal area with significant discomfort.  Unable to take  One remaining appt  at 1100 10/27/11 d/t would have to shut office and there is no staff to call in.  Called Carrie/ office for permission to send to [facility].

## 2011-11-26 ENCOUNTER — Other Ambulatory Visit: Payer: Self-pay | Admitting: Family Medicine

## 2012-01-26 ENCOUNTER — Telehealth: Payer: Self-pay | Admitting: Family Medicine

## 2012-01-26 DIAGNOSIS — E0590 Thyrotoxicosis, unspecified without thyrotoxic crisis or storm: Secondary | ICD-10-CM

## 2012-01-26 DIAGNOSIS — E785 Hyperlipidemia, unspecified: Secondary | ICD-10-CM

## 2012-01-26 DIAGNOSIS — I10 Essential (primary) hypertension: Secondary | ICD-10-CM

## 2012-01-26 DIAGNOSIS — E119 Type 2 diabetes mellitus without complications: Secondary | ICD-10-CM

## 2012-01-26 NOTE — Telephone Encounter (Signed)
Pt also wants physical labs, A1C.

## 2012-01-26 NOTE — Telephone Encounter (Signed)
Ok. I will place orders

## 2012-01-26 NOTE — Telephone Encounter (Signed)
Yes ok to check. 

## 2012-01-26 NOTE — Telephone Encounter (Signed)
Pt is wanting to know if she could have her TSH checked. She says its been a while since it has been checked.

## 2012-01-29 NOTE — Telephone Encounter (Signed)
Lab appt scheduled.

## 2012-02-08 ENCOUNTER — Other Ambulatory Visit (INDEPENDENT_AMBULATORY_CARE_PROVIDER_SITE_OTHER): Payer: 59

## 2012-02-08 DIAGNOSIS — E119 Type 2 diabetes mellitus without complications: Secondary | ICD-10-CM

## 2012-02-08 DIAGNOSIS — E785 Hyperlipidemia, unspecified: Secondary | ICD-10-CM

## 2012-02-08 DIAGNOSIS — I10 Essential (primary) hypertension: Secondary | ICD-10-CM

## 2012-02-08 DIAGNOSIS — E0590 Thyrotoxicosis, unspecified without thyrotoxic crisis or storm: Secondary | ICD-10-CM

## 2012-02-08 LAB — COMPREHENSIVE METABOLIC PANEL
ALT: 39 U/L — ABNORMAL HIGH (ref 0–35)
AST: 25 U/L (ref 0–37)
Albumin: 4.5 g/dL (ref 3.5–5.2)
Alkaline Phosphatase: 60 U/L (ref 39–117)
Calcium: 10.2 mg/dL (ref 8.4–10.5)
Chloride: 103 mEq/L (ref 96–112)
Creatinine, Ser: 0.7 mg/dL (ref 0.4–1.2)
Potassium: 5.2 mEq/L — ABNORMAL HIGH (ref 3.5–5.1)

## 2012-02-08 LAB — TSH: TSH: 9.49 u[IU]/mL — ABNORMAL HIGH (ref 0.35–5.50)

## 2012-02-08 LAB — LIPID PANEL
Total CHOL/HDL Ratio: 9
Triglycerides: 263 mg/dL — ABNORMAL HIGH (ref 0.0–149.0)
VLDL: 52.6 mg/dL — ABNORMAL HIGH (ref 0.0–40.0)

## 2012-02-13 ENCOUNTER — Ambulatory Visit (INDEPENDENT_AMBULATORY_CARE_PROVIDER_SITE_OTHER): Payer: 59 | Admitting: Family Medicine

## 2012-02-13 ENCOUNTER — Encounter: Payer: Self-pay | Admitting: Family Medicine

## 2012-02-13 VITALS — BP 140/94 | HR 72 | Temp 98.0°F | Wt 181.0 lb

## 2012-02-13 DIAGNOSIS — E0590 Thyrotoxicosis, unspecified without thyrotoxic crisis or storm: Secondary | ICD-10-CM

## 2012-02-13 DIAGNOSIS — E875 Hyperkalemia: Secondary | ICD-10-CM

## 2012-02-13 DIAGNOSIS — E039 Hypothyroidism, unspecified: Secondary | ICD-10-CM | POA: Insufficient documentation

## 2012-02-13 DIAGNOSIS — E785 Hyperlipidemia, unspecified: Secondary | ICD-10-CM

## 2012-02-13 DIAGNOSIS — Z23 Encounter for immunization: Secondary | ICD-10-CM

## 2012-02-13 DIAGNOSIS — E119 Type 2 diabetes mellitus without complications: Secondary | ICD-10-CM

## 2012-02-13 MED ORDER — EZETIMIBE 10 MG PO TABS: 10.0000 mg | ORAL_TABLET | Freq: Every day | ORAL | Status: DC

## 2012-02-13 MED ORDER — FENOFIBRATE 54 MG PO TABS: 54.0000 mg | ORAL_TABLET | Freq: Every day | ORAL | Status: DC

## 2012-02-13 MED ORDER — LEVOTHYROXINE SODIUM 112 MCG PO TABS: 112.0000 ug | ORAL_TABLET | Freq: Every day | ORAL | Status: DC

## 2012-02-13 NOTE — Patient Instructions (Addendum)
Restart the Zetia and fenofibrate immediately. We have increased your synthroid to 112 mcg daily. We are rechecking your potassium and your thyroid today.  Let's recheck your cholesterol, thyroid function, a1c in 3 months.

## 2012-02-13 NOTE — Progress Notes (Signed)
61 yo pt here for follow up.  HLD- was going to lipid clinic but stopped going.  Her parents have been sick and she has been caring for them. No longer walking.  Has gained weight. Wt Readings from Last 3 Encounters:  02/13/12 181 lb (82.101 kg)  08/30/11 171 lb 8 oz (77.792 kg)  02/23/11 166 lb 12 oz (75.637 kg)    She has a h/o very elevated lipids and intolerance to meds. Improved. Very intolerant to statins. Stopped taking her Zetia and Fenofibrate. Lab Results  Component Value Date   CHOL 344* 02/08/2012   HDL 39.60 02/08/2012   LDLDIRECT 274.4 02/08/2012   TRIG 263.0* 02/08/2012   CHOLHDL 9 02/08/2012    On Zetia 10 mg daily and Fenofibrate 54 mg daily.  DM- deteriorated. Lab Results  Component Value Date   HGBA1C 7.1* 02/08/2012  On Metformin 500 mg twice daily Admits to not checking sugars regularly.  Thyroid dysfunction- TSH is elevated and she does feel fatigued.  Denies any other symptoms of hyper or hypo thyroidism. Lab Results  Component Value Date   TSH 9.49* 02/08/2012   Patient Active Problem List  Diagnosis  . HYPERTHYROIDISM  . DIABETES MELLITUS, TYPE II  . HYPERLIPIDEMIA  . HYPERTENSION  . DERMATITIS, CONTACT, NOS  . SLEEP APNEA  . SKIN RASH  . HYPERGLYCEMIA  . Abscess of right arm  . Anxiety   Past Medical History  Diagnosis Date  . Diabetes mellitus     type II  . Hyperlipidemia   . Hyperthyroidism     ??thyroid nodules   . Foot injury 08    Left foot/leg with ?? torn muscle   . Hand pain 1/09    steroid injections  . Adenomatous colon polyp   . Internal hemorrhoids   . IBS (irritable bowel syndrome)   . Depression   . Panic disorder    Past Surgical History  Procedure Date  . Colonoscopy w/ biopsies and polypectomy 05/19/2005    adenomatous polyps   History  Substance Use Topics  . Smoking status: Never Smoker   . Smokeless tobacco: Never Used  . Alcohol Use: No   No family history on file. Allergies  Allergen Reactions    . Atorvastatin     REACTION: Hot flashes and flu-like symptoms  . Niacin     REACTION: n/v  . Pravastatin Sodium     REACTION: Neck swelling and pain in her shoulders and arms.  . Rosuvastatin     REACTION: N/V and heartburn  . Simvastatin     REACTION: GI  . Sulfonamide Derivatives    Current Outpatient Prescriptions on File Prior to Visit  Medication Sig Dispense Refill  . aspirin 81 MG tablet Take 81 mg by mouth daily.        . busPIRone (BUSPAR) 15 MG tablet 1/2 tab by mouth twice daily for 1 week, then increase to 1 tab twice daily.  60 tablet  1  . Calcium Carbonate-Vitamin D (CALTRATE 600+D PO) Take by mouth.        . Calcium Carbonate-Vitamin D (CALTRATE 600+D) 600-400 MG-UNIT per tablet Take 1 tablet by mouth daily.        . Cholecalciferol (VITAMIN D) 1000 UNITS capsule Take 1,000 Units by mouth daily. Take one by mouth two times a day       . glucose blood test strip Use to check blood sugar up to two times daily  100 each  12  .  levothyroxine (SYNTHROID) 100 MCG tablet Take 1 tablet (100 mcg total) by mouth daily.  90 tablet  3  . metFORMIN (GLUCOPHAGE) 500 MG tablet TAKE 1 TABLET TWICE A DAY  180 tablet  2  . Multiple Vitamins-Iron (DAILY VITAMINS/IRON) TABS Take by mouth.        . ramipril (ALTACE) 2.5 MG capsule TAKE 1 CAPSULE DAILY  90 capsule  2  . valACYclovir (VALTREX) 1000 MG tablet Take 1 tablet (1,000 mg total) by mouth 3 (three) times daily.  21 tablet  0  . vitamin B-12 (CYANOCOBALAMIN) 250 MCG tablet Take 250 mcg by mouth daily.           The PMH, PSH, Social History, Family History, Medications, and allergies have been reviewed in [facility], and have been updated if relevant.   Review of Systems       See HPI   Physical Exam BP 140/94  Pulse 72  Temp 98 F (36.7 C)  Wt 181 lb (82.101 kg)  General:  Well-developed,well-nourished,in no acute distress; alert,appropriate and cooperative throughout examination Head:  normocephalic and atraumatic.   Eyes:   vision grossly intact, pupils equal, pupils round, and pupils reactive to light.   Ears:  R ear normal and L ear normal.   Lungs:  Normal respiratory effort, chest expands symmetrically. Lungs are clear to auscultation, no crackles or wheezes. Heart:  Normal rate and regular rhythm. S1 and S2 normal without gallop, murmur, click, rub or other extra sounds. Abdomen:  Bowel sounds positive,abdomen soft and non-tender without masses, organomegaly or hernias noted. Msk:  No deformity or scoliosis noted of thoracic or lumbar spine.   Extremities:  No clubbing, cyanosis, edema, or deformity noted with normal full range of motion of all joints.   Neurologic:  alert & oriented X3 and gait normal.   Skin:  Intact without suspicious lesions or rashes Psych:  Cognition and judgment appear intact. Alert and cooperative with normal attention span and concentration. No apparent delusions, illusions, hallucinations  Assessment and Plan: 1. HYPOTHYROIDISM  Deteriorated.  Increase Synthroid to 112 mcg daily.  Recheck lipid panel today. Orders Placed This Encounter  Procedures  . Flu vaccine greater than or equal to 3yo preservative free IM  . Comprehensive metabolic panel  . T4, Free  . TSH     2. HYPERTENSION  Stable.   3. HYPERLIPIDEMIA  Deteriorated. Restart Zetia and Fenofibrate. Recheck lipids in 3 months.   4. DIABETES MELLITUS, TYPE II  Deteriorated.  She will work on cutting back on sugars, alcohol and fast food/starches. Hemoglobin A1c

## 2012-02-14 LAB — COMPREHENSIVE METABOLIC PANEL
Albumin: 4.5 g/dL (ref 3.5–5.2)
Alkaline Phosphatase: 55 U/L (ref 39–117)
BUN: 16 mg/dL (ref 6–23)
CO2: 30 mEq/L (ref 19–32)
Calcium: 9.7 mg/dL (ref 8.4–10.5)
Chloride: 102 mEq/L (ref 96–112)
Glucose, Bld: 161 mg/dL — ABNORMAL HIGH (ref 70–99)
Potassium: 4.9 mEq/L (ref 3.5–5.1)
Sodium: 139 mEq/L (ref 135–145)
Total Protein: 7.2 g/dL (ref 6.0–8.3)

## 2012-02-14 LAB — TSH: TSH: 9.2 u[IU]/mL — ABNORMAL HIGH (ref 0.35–5.50)

## 2012-02-14 LAB — T4, FREE: Free T4: 0.79 ng/dL (ref 0.60–1.60)

## 2012-03-01 ENCOUNTER — Other Ambulatory Visit: Payer: Self-pay | Admitting: *Deleted

## 2012-03-01 MED ORDER — METFORMIN HCL 500 MG PO TABS: 500.0000 mg | ORAL_TABLET | Freq: Two times a day (BID) | ORAL | Status: DC

## 2012-03-01 NOTE — Telephone Encounter (Signed)
Pt needs a 2 week supply of metformin sent in to a local pharmacy, she is waiting for mail order to arrive

## 2012-06-13 ENCOUNTER — Encounter: Payer: Self-pay | Admitting: Internal Medicine

## 2012-07-05 ENCOUNTER — Ambulatory Visit: Payer: 59 | Admitting: Internal Medicine

## 2012-07-18 ENCOUNTER — Other Ambulatory Visit: Payer: Self-pay | Admitting: Family Medicine

## 2012-08-28 ENCOUNTER — Other Ambulatory Visit: Payer: Self-pay | Admitting: Family Medicine

## 2012-09-12 ENCOUNTER — Other Ambulatory Visit: Payer: Self-pay | Admitting: Family Medicine

## 2012-09-12 DIAGNOSIS — I10 Essential (primary) hypertension: Secondary | ICD-10-CM

## 2012-09-12 DIAGNOSIS — E119 Type 2 diabetes mellitus without complications: Secondary | ICD-10-CM

## 2012-09-12 DIAGNOSIS — E0590 Thyrotoxicosis, unspecified without thyrotoxic crisis or storm: Secondary | ICD-10-CM

## 2012-09-12 DIAGNOSIS — E785 Hyperlipidemia, unspecified: Secondary | ICD-10-CM

## 2012-09-26 ENCOUNTER — Other Ambulatory Visit (INDEPENDENT_AMBULATORY_CARE_PROVIDER_SITE_OTHER): Payer: 59

## 2012-09-26 DIAGNOSIS — E785 Hyperlipidemia, unspecified: Secondary | ICD-10-CM

## 2012-09-26 DIAGNOSIS — E0590 Thyrotoxicosis, unspecified without thyrotoxic crisis or storm: Secondary | ICD-10-CM

## 2012-09-26 DIAGNOSIS — E119 Type 2 diabetes mellitus without complications: Secondary | ICD-10-CM

## 2012-09-26 LAB — COMPREHENSIVE METABOLIC PANEL
ALT: 66 U/L — ABNORMAL HIGH (ref 0–35)
AST: 48 U/L — ABNORMAL HIGH (ref 0–37)
Albumin: 4.3 g/dL (ref 3.5–5.2)
Calcium: 9.4 mg/dL (ref 8.4–10.5)
Chloride: 100 mEq/L (ref 96–112)
Creatinine, Ser: 0.8 mg/dL (ref 0.4–1.2)
Potassium: 5.2 mEq/L — ABNORMAL HIGH (ref 3.5–5.1)

## 2012-09-26 LAB — LIPID PANEL
HDL: 34.8 mg/dL — ABNORMAL LOW (ref >39.00)
Total CHOL/HDL Ratio: 9

## 2012-09-26 LAB — T4, FREE: Free T4: 0.9 ng/dL (ref 0.60–1.60)

## 2012-09-26 LAB — TSH: TSH: 3.71 u[IU]/mL (ref 0.35–5.50)

## 2012-10-01 ENCOUNTER — Encounter: Payer: 59 | Admitting: Family Medicine

## 2012-10-01 ENCOUNTER — Encounter: Payer: Self-pay | Admitting: Family Medicine

## 2012-10-01 ENCOUNTER — Ambulatory Visit (INDEPENDENT_AMBULATORY_CARE_PROVIDER_SITE_OTHER): Payer: 59 | Admitting: Family Medicine

## 2012-10-01 VITALS — BP 140/90 | HR 72 | Temp 97.9°F | Ht 63.75 in | Wt 178.0 lb

## 2012-10-01 DIAGNOSIS — I10 Essential (primary) hypertension: Secondary | ICD-10-CM

## 2012-10-01 DIAGNOSIS — Z0000 Encounter for general adult medical examination without abnormal findings: Secondary | ICD-10-CM | POA: Insufficient documentation

## 2012-10-01 DIAGNOSIS — E119 Type 2 diabetes mellitus without complications: Secondary | ICD-10-CM

## 2012-10-01 DIAGNOSIS — Z1211 Encounter for screening for malignant neoplasm of colon: Secondary | ICD-10-CM

## 2012-10-01 DIAGNOSIS — E785 Hyperlipidemia, unspecified: Secondary | ICD-10-CM

## 2012-10-01 DIAGNOSIS — E0590 Thyrotoxicosis, unspecified without thyrotoxic crisis or storm: Secondary | ICD-10-CM

## 2012-10-01 DIAGNOSIS — Z1231 Encounter for screening mammogram for malignant neoplasm of breast: Secondary | ICD-10-CM

## 2012-10-01 MED ORDER — METFORMIN HCL 1000 MG PO TABS: ORAL_TABLET | ORAL | Status: DC

## 2012-10-01 NOTE — Patient Instructions (Addendum)
Great to see you. Please increase your Metformin to 1000 mg twice daily- ok to double up on the 500 mg if you still have those at home. Come back for labs in 3 months.  Come see me for pap smear at your convenience. Set up your mammogram.  Check with your insurance to see if they will cover the shingles shot.

## 2012-10-01 NOTE — Progress Notes (Signed)
62 yo female here for follow up.  Was initially here for CPX but would like to reschedule this.   Had been seeing OBGYN, Dr. Henderson Cloud.  Per pt over due for pap smear but would like to have this done here on another day.  HLD- was going to lipid clinic but stopped going.   No longer walking.  Has gained weight.  Wt Readings from Last 3 Encounters:  10/01/12 178 lb (80.74 kg)  02/13/12 181 lb (82.101 kg)  08/30/11 171 lb 8 oz (77.792 kg)     She has a h/o very elevated lipids and intolerance to meds. Very intolerant to statins.  Lab Results  Component Value Date   CHOL 302* 09/26/2012   HDL 34.80* 09/26/2012   LDLDIRECT 236.3 09/26/2012   TRIG 164.0* 09/26/2012   CHOLHDL 9 09/26/2012    On Zetia 10 mg daily and Fenofibrate 54 mg daily.  DM- deteriorated.  Under significant stress. Lab Results  Component Value Date   HGBA1C 8.8* 09/26/2012  On Metformin 500 mg twice daily Admits to not checking sugars regularly but when she does, has been running around 220.  Thyroid dysfunction- thyroid studies normal.  Denies any symptoms of hypo or hyperthyroidism.  Lab Results  Component Value Date   TSH 3.71 09/26/2012   Patient Active Problem List   Diagnosis Date Noted  . Routine general medical examination at a health care facility 10/01/2012  . Hyperkalemia 02/13/2012  . Hypothyroidism 02/13/2012  . Anxiety 02/23/2011  . Abscess of right arm 09/20/2010  . HYPERTENSION 06/28/2007  . DERMATITIS, CONTACT, NOS 10/03/2006  . HYPERTHYROIDISM 09/18/2006  . DIABETES MELLITUS, TYPE II 09/18/2006  . HYPERLIPIDEMIA 09/18/2006  . SLEEP APNEA 09/18/2006  . HYPERGLYCEMIA 09/18/2006   Past Medical History  Diagnosis Date  . Diabetes mellitus     type II  . Hyperlipidemia   . Hyperthyroidism     ??thyroid nodules   . Foot injury 08    Left foot/leg with ?? torn muscle   . Hand pain 1/09    steroid injections  . Adenomatous colon polyp   . Internal hemorrhoids   . IBS (irritable bowel  syndrome)   . Depression   . Panic disorder    Past Surgical History  Procedure Laterality Date  . Colonoscopy w/ biopsies and polypectomy  05/19/2005    adenomatous polyps   History  Substance Use Topics  . Smoking status: Never Smoker   . Smokeless tobacco: Never Used  . Alcohol Use: No   No family history on file. Allergies  Allergen Reactions  . Atorvastatin     REACTION: Hot flashes and flu-like symptoms  . Niacin     REACTION: n/v  . Pravastatin Sodium     REACTION: Neck swelling and pain in her shoulders and arms.  . Rosuvastatin     REACTION: N/V and heartburn  . Simvastatin     REACTION: GI  . Sulfonamide Derivatives    Current Outpatient Prescriptions on File Prior to Visit  Medication Sig Dispense Refill  . aspirin 81 MG tablet Take 81 mg by mouth daily.        . busPIRone (BUSPAR) 15 MG tablet 1/2 tab by mouth twice daily for 1 week, then increase to 1 tab twice daily.  60 tablet  1  . Calcium Carbonate-Vitamin D (CALTRATE 600+D PO) Take by mouth.        . Calcium Carbonate-Vitamin D (CALTRATE 600+D) 600-400 MG-UNIT per tablet Take 1 tablet by  mouth daily.        . Cholecalciferol (VITAMIN D) 1000 UNITS capsule Take 1,000 Units by mouth daily. Take one by mouth two times a day       . ezetimibe (ZETIA) 10 MG tablet Take 1 tablet (10 mg total) by mouth daily. Take one tablet by mouth daily  30 tablet  6  . fenofibrate 54 MG tablet Take 1 tablet (54 mg total) by mouth daily.  60 tablet  3  . levothyroxine (SYNTHROID) 112 MCG tablet Take 1 tablet (112 mcg total) by mouth daily.  90 tablet  3  . metFORMIN (GLUCOPHAGE) 500 MG tablet Take 1 tablet twice a day  60 tablet  3  . Multiple Vitamins-Iron (DAILY VITAMINS/IRON) TABS Take by mouth.        . ONE TOUCH ULTRA TEST test strip USE TO CHECK BLOOD SUGAR UP TO TWO TIMES DAILY  100 each  5  . ramipril (ALTACE) 2.5 MG capsule Take 1 capsule daily  30 capsule  3  . vitamin B-12 (CYANOCOBALAMIN) 250 MCG tablet Take 250  mcg by mouth daily.         No current facility-administered medications on file prior to visit.     The PMH, PSH, Social History, Family History, Medications, and allergies have been reviewed in [facility], and have been updated if relevant.   Review of Systems       See HPI   Physical Exam BP 140/90  Pulse 72  Temp(Src) 97.9 F (36.6 C)  Ht 5' 3.75" (1.619 m)  Wt 178 lb (80.74 kg)  BMI 30.8 kg/m2  General:  Well-developed,well-nourished,in no acute distress; alert,appropriate and cooperative throughout examination Head:  normocephalic and atraumatic.   Eyes:  vision grossly intact, pupils equal, pupils round, and pupils reactive to light.   Ears:  R ear normal and L ear normal.   Lungs:  Normal respiratory effort, chest expands symmetrically. Lungs are clear to auscultation, no crackles or wheezes. Heart:  Normal rate and regular rhythm. S1 and S2 normal without gallop, murmur, click, rub or other extra sounds. Abdomen:  Bowel sounds positive,abdomen soft and non-tender without masses, organomegaly or hernias noted. Msk:  No deformity or scoliosis noted of thoracic or lumbar spine.   Extremities:  No clubbing, cyanosis, edema, or deformity noted with normal full range of motion of all joints.   Neurologic:  alert & oriented X3 and gait normal.   Skin:  Intact without suspicious lesions or rashes Psych:  Cognition and judgment appear intact. Alert and cooperative with normal attention span and concentration. No apparent delusions, illusions, hallucinations  Assessment and Plan:  1. HYPERTHYROIDISM Stable on current dose of synthroid.  No chnages.   2. HYPERTENSION Stable.  Good control.  3. HYPERLIPIDEMIA Uncontrolled, pt aware and does not want to return to lipid clinic.  4. DIABETES MELLITUS, TYPE II Deteriorated.  Discussed diet and exercise.  Increase Metformin to 1000 mg twice daily.  Follow up in 3 months. The patient indicates understanding of these issues and agrees  with the plan.   5. Other screening mammogram  - MM Digital Screening; Future  6. Special screening for malignant neoplasms, colon  - Fecal occult blood, imunochemical; Future

## 2012-10-23 ENCOUNTER — Other Ambulatory Visit (INDEPENDENT_AMBULATORY_CARE_PROVIDER_SITE_OTHER): Payer: 59

## 2012-10-23 DIAGNOSIS — Z1211 Encounter for screening for malignant neoplasm of colon: Secondary | ICD-10-CM

## 2012-10-24 LAB — FECAL OCCULT BLOOD, IMMUNOCHEMICAL: Fecal Occult Bld: POSITIVE

## 2012-11-23 ENCOUNTER — Emergency Department (HOSPITAL_COMMUNITY): Payer: 59

## 2012-11-23 ENCOUNTER — Encounter (HOSPITAL_COMMUNITY): Payer: Self-pay | Admitting: Physical Medicine and Rehabilitation

## 2012-11-23 ENCOUNTER — Emergency Department (HOSPITAL_COMMUNITY)
Admission: EM | Admit: 2012-11-23 | Discharge: 2012-11-23 | Disposition: A | Discharge status: Home / Self Care | Payer: 59 | Attending: Emergency Medicine | Admitting: Emergency Medicine

## 2012-11-23 DIAGNOSIS — E0590 Thyrotoxicosis, unspecified without thyrotoxic crisis or storm: Secondary | ICD-10-CM | POA: Insufficient documentation

## 2012-11-23 DIAGNOSIS — Z862 Personal history of diseases of the blood and blood-forming organs and certain disorders involving the immune mechanism: Secondary | ICD-10-CM | POA: Insufficient documentation

## 2012-11-23 DIAGNOSIS — Z8659 Personal history of other mental and behavioral disorders: Secondary | ICD-10-CM | POA: Insufficient documentation

## 2012-11-23 DIAGNOSIS — N200 Calculus of kidney: Secondary | ICD-10-CM | POA: Insufficient documentation

## 2012-11-23 DIAGNOSIS — Z8639 Personal history of other endocrine, nutritional and metabolic disease: Secondary | ICD-10-CM | POA: Insufficient documentation

## 2012-11-23 DIAGNOSIS — E119 Type 2 diabetes mellitus without complications: Secondary | ICD-10-CM | POA: Insufficient documentation

## 2012-11-23 DIAGNOSIS — K5900 Constipation, unspecified: Secondary | ICD-10-CM | POA: Insufficient documentation

## 2012-11-23 DIAGNOSIS — Z8719 Personal history of other diseases of the digestive system: Secondary | ICD-10-CM | POA: Insufficient documentation

## 2012-11-23 DIAGNOSIS — Z860100 Personal history of colon polyps, unspecified: Secondary | ICD-10-CM | POA: Insufficient documentation

## 2012-11-23 DIAGNOSIS — I10 Essential (primary) hypertension: Secondary | ICD-10-CM | POA: Insufficient documentation

## 2012-11-23 DIAGNOSIS — Z87828 Personal history of other (healed) physical injury and trauma: Secondary | ICD-10-CM | POA: Insufficient documentation

## 2012-11-23 DIAGNOSIS — Z8679 Personal history of other diseases of the circulatory system: Secondary | ICD-10-CM | POA: Insufficient documentation

## 2012-11-23 DIAGNOSIS — Z79899 Other long term (current) drug therapy: Secondary | ICD-10-CM | POA: Insufficient documentation

## 2012-11-23 HISTORY — DX: Essential (primary) hypertension: I10

## 2012-11-23 LAB — CBC WITH DIFFERENTIAL/PLATELET
Basophils Absolute: 0.1 10*3/uL (ref 0.0–0.1)
Basophils Relative: 1 % (ref 0–1)
Eosinophils Absolute: 0.3 10*3/uL (ref 0.0–0.7)
Eosinophils Relative: 5 % (ref 0–5)
HCT: 40.6 % (ref 36.0–46.0)
Hemoglobin: 13.7 g/dL (ref 12.0–15.0)
Lymphocytes Relative: 30 % (ref 12–46)
Lymphs Abs: 1.8 10*3/uL (ref 0.7–4.0)
MCH: 29.3 pg (ref 26.0–34.0)
MCHC: 33.7 g/dL (ref 30.0–36.0)
MCV: 86.8 fL (ref 78.0–100.0)
Monocytes Absolute: 0.4 10*3/uL (ref 0.1–1.0)
Monocytes Relative: 6 % (ref 3–12)
Neutro Abs: 3.5 10*3/uL (ref 1.7–7.7)
Neutrophils Relative %: 58 % (ref 43–77)
Platelets: 244 10*3/uL (ref 150–400)
RBC: 4.68 MIL/uL (ref 3.87–5.11)
RDW: 13.3 % (ref 11.5–15.5)
WBC: 6.1 10*3/uL (ref 4.0–10.5)

## 2012-11-23 LAB — URINALYSIS, ROUTINE W REFLEX MICROSCOPIC
Bilirubin Urine: NEGATIVE
Glucose, UA: NEGATIVE mg/dL
Ketones, ur: NEGATIVE mg/dL
Nitrite: NEGATIVE
Protein, ur: NEGATIVE mg/dL
Specific Gravity, Urine: 1.037 — ABNORMAL HIGH (ref 1.005–1.030)
Urobilinogen, UA: 0.2 mg/dL (ref 0.0–1.0)
pH: 6 (ref 5.0–8.0)

## 2012-11-23 LAB — COMPREHENSIVE METABOLIC PANEL
ALT: 65 U/L — ABNORMAL HIGH (ref 0–35)
AST: 44 U/L — ABNORMAL HIGH (ref 0–37)
Albumin: 4.2 g/dL (ref 3.5–5.2)
Alkaline Phosphatase: 71 U/L (ref 39–117)
BUN: 12 mg/dL (ref 6–23)
CO2: 25 mEq/L (ref 19–32)
Calcium: 9.3 mg/dL (ref 8.4–10.5)
Chloride: 99 mEq/L (ref 96–112)
Creatinine, Ser: 0.68 mg/dL (ref 0.50–1.10)
GFR calc Af Amer: >90 mL/min (ref >90)
GFR calc non Af Amer: >90 mL/min (ref >90)
Glucose, Bld: 189 mg/dL — ABNORMAL HIGH (ref 70–99)
Potassium: 4 mEq/L (ref 3.5–5.1)
Sodium: 137 mEq/L (ref 135–145)
Total Bilirubin: 0.6 mg/dL (ref 0.3–1.2)
Total Protein: 7.4 g/dL (ref 6.0–8.3)

## 2012-11-23 LAB — URINE MICROSCOPIC-ADD ON

## 2012-11-23 LAB — LIPASE, BLOOD: Lipase: 58 U/L (ref 11–59)

## 2012-11-23 MED ORDER — SODIUM CHLORIDE 0.9 % IV BOLUS (SEPSIS)
1000.0000 mL | Freq: Once | INTRAVENOUS | Status: AC
  Administered 2012-11-23: 1000 mL via INTRAVENOUS

## 2012-11-23 MED ORDER — GI COCKTAIL ~~LOC~~
30.0000 mL | Freq: Once | ORAL | Status: AC
  Administered 2012-11-23: 30 mL via ORAL
  Filled 2012-11-23: qty 30

## 2012-11-23 MED ORDER — MORPHINE SULFATE 4 MG/ML IJ SOLN
6.0000 mg | Freq: Once | INTRAMUSCULAR | Status: DC
  Filled 2012-11-23: qty 2

## 2012-11-23 MED ORDER — OXYCODONE-ACETAMINOPHEN 5-325 MG PO TABS: 1.0000 | ORAL_TABLET | Freq: Four times a day (QID) | ORAL | Status: DC | PRN

## 2012-11-23 MED ORDER — IOHEXOL 300 MG/ML  SOLN
100.0000 mL | Freq: Once | INTRAMUSCULAR | Status: AC | PRN
  Administered 2012-11-23: 100 mL via INTRAVENOUS

## 2012-11-23 MED ORDER — KETOROLAC TROMETHAMINE 30 MG/ML IJ SOLN
30.0000 mg | Freq: Once | INTRAMUSCULAR | Status: AC
  Administered 2012-11-23: 30 mg via INTRAVENOUS
  Filled 2012-11-23: qty 1

## 2012-11-23 NOTE — ED Notes (Signed)
Pt returns from ct scan. 

## 2012-11-23 NOTE — ED Provider Notes (Signed)
History    CSN: [id number] Arrival date & time 11/23/12  0807  First MD Initiated Contact with Patient 11/23/12 [phone]     Chief Complaint  Patient presents with  . Abdominal Pain   (Consider location/radiation/quality/duration/timing/severity/associated sxs/prior Treatment) HPI The patient presents to the emergency department with left sided pain.  Patient, states it began 1 month ago, but worsened over the last 2 days.  States, has been intermittent up until the last 2 days.  Patient denies chest pain, shortness of breath, nausea, vomiting, diarrhea, or blurred vision, dizziness, weakness, dysuria, hematuria, or fever.  Patient, states she did not take any medications prior to arrival.  Patient, states nothing sees make her condition, better or worse.  Patient also, states that some constipation, as well.  Past Medical History  Diagnosis Date  . Diabetes mellitus     type II  . Hyperlipidemia   . Hyperthyroidism     ??thyroid nodules   . Foot injury 08    Left foot/leg with ?? torn muscle   . Hand pain 1/09    steroid injections  . Adenomatous colon polyp   . Internal hemorrhoids   . IBS (irritable bowel syndrome)   . Depression   . Panic disorder   . Hypertension    Past Surgical History  Procedure Laterality Date  . Colonoscopy w/ biopsies and polypectomy  05/19/2005    adenomatous polyps   No family history on file. History  Substance Use Topics  . Smoking status: Never Smoker   . Smokeless tobacco: Never Used  . Alcohol Use: No   OB History   Grav Para Term Preterm Abortions TAB SAB Ect Mult Living                 Review of Systems All other systems negative except as documented in the HPI. All pertinent positives and negatives as reviewed in the HPI. Allergies  Atorvastatin; Niacin; Pravastatin sodium; Rosuvastatin; Simvastatin; and Sulfonamide derivatives  Home Medications   Current Outpatient Rx  Name  Route  Sig  Dispense  Refill  . acetaminophen  (TYLENOL) 500 MG tablet   Oral   Take 1,000 mg by mouth every 6 (six) hours as needed for pain.         Marland Kitchen bismuth subsalicylate (PEPTO BISMOL) 262 MG/15ML suspension   Oral   Take 30 mLs by mouth every 6 (six) hours as needed for indigestion.         . Calcium Carbonate-Vitamin D (CALTRATE 600+D PO)   Oral   Take 1 capsule by mouth daily.          Marland Kitchen levothyroxine (SYNTHROID) 112 MCG tablet   Oral   Take 1 tablet (112 mcg total) by mouth daily.   90 tablet   3   . metFORMIN (GLUCOPHAGE) 1000 MG tablet   Oral   Take 1,000 mg by mouth 2 (two) times daily with a meal.         . Multiple Vitamins-Iron (DAILY VITAMINS/IRON) TABS   Oral   Take by mouth.           . ONE TOUCH ULTRA TEST test strip      USE TO CHECK BLOOD SUGAR UP TO TWO TIMES DAILY   100 each   5   . ramipril (ALTACE) 2.5 MG capsule      Take 1 capsule daily   30 capsule   3   . vitamin B-12 (CYANOCOBALAMIN) 250 MCG tablet  Oral   Take 250 mcg by mouth daily.           Marland Kitchen oxyCODONE-acetaminophen (PERCOCET/ROXICET) 5-325 MG per tablet   Oral   Take 1 tablet by mouth every 6 (six) hours as needed for pain.   15 tablet   0    BP 138/88  Pulse 59  Temp(Src) 98.2 F (36.8 C) (Oral)  Resp 20  SpO2 96% Physical Exam  Nursing note and vitals reviewed. Constitutional: She is oriented to person, place, and time. She appears well-developed and well-nourished.  HENT:  Head: Normocephalic and atraumatic.  Mouth/Throat: Oropharynx is clear and moist.  Eyes: Pupils are equal, round, and reactive to light.  Neck: Normal range of motion. Neck supple.  Cardiovascular: Normal rate, regular rhythm and normal heart sounds.  Exam reveals no gallop and no friction rub.   No murmur heard. Pulmonary/Chest: Effort normal and breath sounds normal. No respiratory distress.  Abdominal: Soft. Normal appearance and bowel sounds are normal. She exhibits no distension. There is tenderness. There is no rebound, no  guarding and no CVA tenderness. No hernia.    Neurological: She is alert and oriented to person, place, and time.  Skin: Skin is warm and dry. No rash noted.    ED Course  Procedures (including critical care time) Labs Reviewed  URINALYSIS, ROUTINE W REFLEX MICROSCOPIC - Abnormal; Notable for the following:    Specific Gravity, Urine 1.037 (*)    Hgb urine dipstick MODERATE (*)    Leukocytes, UA SMALL (*)    All other components within normal limits  COMPREHENSIVE METABOLIC PANEL - Abnormal; Notable for the following:    Glucose, Bld 189 (*)    AST 44 (*)    ALT 65 (*)    All other components within normal limits  CBC WITH DIFFERENTIAL  LIPASE, BLOOD  URINE MICROSCOPIC-ADD ON   Ct Abdomen Pelvis W Contrast  11/23/2012   *RADIOLOGY REPORT*  Clinical Data: Left upper quadrant abdominal pain.  Frequent belching.  Diabetes.  Hypertension.  CT ABDOMEN AND PELVIS WITH CONTRAST  Technique:  Multidetector CT imaging of the abdomen and pelvis was performed following the standard protocol during bolus administration of intravenous contrast.  Contrast: OMNIPAQUE IOHEXOL 300 MG/ML  SOLN  Comparison: None.  Findings: Aortic valve calcification noted.  Diffuse hepatic steatosis observed.  The spleen, adrenal glands, pancreas, and gallbladder appear normal.Mild stranding along the left renal collecting system noted along with a 1.6 x 0.8 cm mid kidney stone and a 0.5 x 0.3 cm cluster of stones in the left kidney lower pole.  There is left ureteral wall enhancement and periureteral stranding proximally.  No discrete ureteral calculus.  No right-sided stones identified. Prominence of stool throughout the colon suggests constipation.Appendix normal.  The uterus and adnexa appear unremarkable.  No pathologic adenopathy observed.  Intervertebral and facet spurring noted at L5-S1 with loss of disc height.  IMPRESSION: 1.  Large spiculated left mid kidney collecting system calculus with smaller clustered  lower pole calculi, mild stranding around the left collecting system, and mild mucosal enhancement and stranding in the left ureter.  Inflammation related to large stones is favored over tumor as a cause for the stranding and circumferential proximal ureteral wall enhancement.  2.  Diffuse hepatic steatosis.  3. Prominence of stool throughout the colon suggests constipation. 4.  Aortic valve calcification. 5.  L5-S1 spondylosis and degenerative disc disease.   Original Report Authenticated By: Gaylyn Rong, M.D.   1. Kidney stone  on left side    I spoke with Dr. Berneice Heinrich of urology, who looked at the CT scan and her lab tests the patient will need to follow up with him in his office he advised to have her return here as needed.  Patient, agrees to the plan and all questions were answered.  Patient has tolerated oral fluids.  Patient is feeling better after pain medication  MDM  MDM Reviewed: nursing note, vitals and previous chart Interpretation: labs and CT scan Consults: urology      [name], PA-C 11/23/12 1549

## 2012-11-23 NOTE — ED Notes (Signed)
Patient states she would like something for pain at this time, but does not want the morphine ordered. States she wants something that will not "knock her out." Notified PA Lawyer, and he wants to check with urology before medicating. Feedback given to patient and she is fine with that.

## 2012-11-23 NOTE — ED Notes (Signed)
Pt presents to department for evaluation of L upper abdominal pain. Ongoing x1 month. Pt states pain increases after eating. 5/10 sharp stabbing pain at the time. Also reports frequent burping and belching. Denies nausea/vomiting/diarrhea. Denies urinary symptoms. Pt is alert and oriented x4.

## 2012-11-24 NOTE — ED Provider Notes (Signed)
Medical screening examination/treatment/procedure(s) were performed by non-physician practitioner and as supervising physician I was immediately available for consultation/collaboration.   Richardean Canal, MD 11/24/12 2219

## 2012-12-17 ENCOUNTER — Telehealth: Payer: Self-pay

## 2012-12-17 MED ORDER — RAMIPRIL 2.5 MG PO CAPS: ORAL_CAPSULE | ORAL | Status: DC

## 2012-12-17 NOTE — Telephone Encounter (Signed)
Pt changing ramipril to [facility]; refill # 30 x 1. Pt will cb to schedule appt 12/2012 with Dr Dayton Martes. Pt wanted Dr Dayton Martes to know she is seeing [facility] for kidney stone on 12/18/12.

## 2012-12-17 NOTE — Telephone Encounter (Signed)
Ok to refill.  Thank you for update.

## 2013-01-02 ENCOUNTER — Encounter: Payer: Self-pay | Admitting: *Deleted

## 2013-01-02 ENCOUNTER — Other Ambulatory Visit (INDEPENDENT_AMBULATORY_CARE_PROVIDER_SITE_OTHER): Payer: 59

## 2013-01-02 ENCOUNTER — Other Ambulatory Visit: Payer: Self-pay | Admitting: Family Medicine

## 2013-01-02 DIAGNOSIS — Z1211 Encounter for screening for malignant neoplasm of colon: Secondary | ICD-10-CM

## 2013-01-06 ENCOUNTER — Other Ambulatory Visit: Payer: Self-pay | Admitting: Urology

## 2013-01-08 ENCOUNTER — Other Ambulatory Visit: Payer: Self-pay | Admitting: Urology

## 2013-01-08 ENCOUNTER — Other Ambulatory Visit: Payer: Self-pay | Admitting: Radiology

## 2013-01-08 DIAGNOSIS — N200 Calculus of kidney: Secondary | ICD-10-CM

## 2013-01-14 ENCOUNTER — Encounter (HOSPITAL_COMMUNITY): Payer: Self-pay | Admitting: Pharmacy Technician

## 2013-01-14 ENCOUNTER — Ambulatory Visit (HOSPITAL_COMMUNITY)
Admission: RE | Admit: 2013-01-14 | Discharge: 2013-01-14 | Disposition: A | Discharge status: Home / Self Care | Payer: 59 | Source: Ambulatory Visit | Attending: Urology | Admitting: Urology

## 2013-01-14 ENCOUNTER — Encounter (HOSPITAL_COMMUNITY): Payer: Self-pay

## 2013-01-14 ENCOUNTER — Encounter (HOSPITAL_COMMUNITY)
Admission: RE | Admit: 2013-01-14 | Discharge: 2013-01-14 | Disposition: A | Discharge status: Home / Self Care | Payer: 59 | Source: Ambulatory Visit | Attending: Urology | Admitting: Urology

## 2013-01-14 DIAGNOSIS — N200 Calculus of kidney: Secondary | ICD-10-CM | POA: Insufficient documentation

## 2013-01-14 DIAGNOSIS — I700 Atherosclerosis of aorta: Secondary | ICD-10-CM | POA: Insufficient documentation

## 2013-01-14 DIAGNOSIS — Z01818 Encounter for other preprocedural examination: Secondary | ICD-10-CM | POA: Insufficient documentation

## 2013-01-14 DIAGNOSIS — Z01810 Encounter for preprocedural cardiovascular examination: Secondary | ICD-10-CM | POA: Insufficient documentation

## 2013-01-14 DIAGNOSIS — I10 Essential (primary) hypertension: Secondary | ICD-10-CM | POA: Insufficient documentation

## 2013-01-14 DIAGNOSIS — Z01812 Encounter for preprocedural laboratory examination: Secondary | ICD-10-CM | POA: Insufficient documentation

## 2013-01-14 HISTORY — DX: Personal history of urinary calculi: Z87.442

## 2013-01-14 HISTORY — DX: Hypothyroidism, unspecified: E03.9

## 2013-01-14 LAB — BASIC METABOLIC PANEL
Chloride: 97 mEq/L (ref 96–112)
Creatinine, Ser: 0.57 mg/dL (ref 0.50–1.10)
GFR calc Af Amer: >90 mL/min (ref >90)
GFR calc non Af Amer: >90 mL/min (ref >90)
Potassium: 4.2 mEq/L (ref 3.5–5.1)

## 2013-01-14 LAB — APTT: aPTT: 33 seconds (ref 24–37)

## 2013-01-14 LAB — PROTIME-INR
INR: 0.96 (ref 0.00–1.49)
Prothrombin Time: 12.6 seconds (ref 11.6–15.2)

## 2013-01-14 LAB — CBC
HCT: 39.6 % (ref 36.0–46.0)
Hemoglobin: 13.6 g/dL (ref 12.0–15.0)
RDW: 13.4 % (ref 11.5–15.5)
WBC: 6.4 10*3/uL (ref 4.0–10.5)

## 2013-01-14 NOTE — Pre-Procedure Instructions (Signed)
EKG AND CXR WERE DONE TODAY - PREOP AT [facility]. 

## 2013-01-14 NOTE — Patient Instructions (Addendum)
YOUR SURGERY IS SCHEDULED AT [facility]  ON:  Monday September 8th  REPORT TO [facility]AY [facility] AT:  11:OO AM      PHONE # FOR SHORT STAY IS [phone]  DO NOT EAT  ANYTHING AFTER MIDNIGHT THE NIGHT BEFORE YOUR SURGERY.   NO FOOD, NO CHEWING GUM, NO MINTS, NO CANDIES, NO CHEWING TOBACCO. YOU MAY HAVE CLEAR LIQUIDS TO DRINK FROM MIDNIGHT UNTIL 7:00 AM DAY OF SURGERY  --  LIKE WATER, GRAPE JUICE, TEA, SODA.    NOTHING TO DRINK AFTER 7:00 AM DAY OF SURGERY.  PLEASE TAKE THE FOLLOWING MEDICATIONS THE AM OF YOUR SURGERY WITH A FEW SIPS OF WATER:  LEVOTHYROXINE    IF YOU ARE DIABETIC:  DO NOT TAKE ANY DIABETIC MEDICATIONS THE AM OF YOUR SURGERY.    IF YOU HAVE SLEEP APNEA AND USE CPAP OR BIPAP--PLEASE BRING THE MASK AND THE TUBING.  DO NOT BRING YOUR MACHINE.  DO NOT BRING VALUABLES, MONEY, CREDIT CARDS.  DO NOT WEAR JEWELRY, MAKE-UP, NAIL POLISH AND NO METAL PINS OR CLIPS IN YOUR HAIR. CONTACT LENS, DENTURES / PARTIALS, GLASSES SHOULD NOT BE WORN TO SURGERY AND IN MOST CASES-HEARING AIDS WILL NEED TO BE REMOVED.  BRING YOUR GLASSES CASE, ANY EQUIPMENT NEEDED FOR YOUR CONTACT LENS. FOR PATIENTS ADMITTED TO THE HOSPITAL--CHECK OUT TIME THE DAY OF DISCHARGE IS 11:00 AM.  ALL INPATIENT ROOMS ARE PRIVATE - WITH BATHROOM, TELEPHONE, TELEVISION AND WIFI INTERNET.                               PLEASE READ OVER ANY  FACT SHEETS THAT YOU WERE GIVEN: BLOOD TRANSFUSION INFORMATION FAILURE TO FOLLOW THESE INSTRUCTIONS MAY RESULT IN THE CANCELLATION OF YOUR SURGERY.   PATIENT SIGNATURE_________________________________

## 2013-01-23 ENCOUNTER — Other Ambulatory Visit: Payer: Self-pay | Admitting: Radiology

## 2013-01-23 ENCOUNTER — Telehealth: Payer: Self-pay

## 2013-01-23 NOTE — Telephone Encounter (Signed)
pt wanted Dr Dayton Martes to know that she has a large kidney stone on lt side and will have it surgically removed on 01/27/13. Pt does not need cb.

## 2013-01-24 NOTE — H&P (Signed)
Chief Complaint  Kidney stone   History of Present Illness  Ms. Joanna [name] is a 62 year old female who presents for further evaluation of a large left renal calculus.   She presented to the emergency department on 11/23/12 with 2 days of severe left-sided flank pain with some radiation to her left upper quadrant.  She did have nausea and vomiting but denies any fever.  She denies a prior history of kidney stones.  She underwent a CT scan at that time which demonstrated a large 1.6 cm calculus in the renal pelvis with a cluster of smaller fragments in the lower pole of the left kidney.  Since then, she has been relatively asymptomatic.  She has no family history of kidney stones.  I independently reviewed her CT scan.  The Hounsfield units of her stone measured 555.  Her urine pH in the emergency department was 7.0.  Her serum creatinine was 0.68.   Past Medical History Problems  1. History of  Adult Sleep Apnea 780.57 2. History of  Depression 311 3. History of  Diabetes Mellitus 250.00 4. History of  Hypercholesterolemia 272.0 5. History of  Hyperlipidemia 272.4 6. History of  Hypertension 401.9 7. History of  Hyperthyroidism 242.90 8. History of  Irritable Bowel Syndrome 564.1 9. History of  Polyps Of The Sigmoid Colon 211.3  Surgical History Problems  1. History of  Complete Colonoscopy For Polyp Removal  Current Meds 1. Fenofibrate 54 MG Oral Tablet; Therapy: 24Sep2013 to 2. Levothyroxine Sodium 112 MCG Oral Tablet; Therapy: 24Sep2013 to 3. MetFORMIN HCl 1000 MG Oral Tablet; Therapy: 13May2014 to 4. Multi-Vitamin TABS; Therapy: (Recorded:30Jul2014) to 5. Ramipril 2.5 MG Oral Capsule; Therapy: 09Apr2014 to 6. Zetia 10 MG Oral Tablet; Therapy: 24Sep2013 to  Allergies Medication  1. No Known Drug Allergies  Family History Problems  1. Paternal history of  Bladder Cancer V16.52 2. Family history of  Family Health Status Number Of Children 1 son 2 daughters  Social History Problems     Caffeine Use   Marital History - Currently Married   Never A Smoker   Occupation: Mudlogger work Denied    History of  Alcohol Use  Review of Systems Constitutional, skin, eye, otolaryngeal, hematologic/lymphatic, cardiovascular, pulmonary, endocrine, musculoskeletal, gastrointestinal, neurological and psychiatric system(s) were reviewed and pertinent findings if present are noted.  Gastrointestinal: constipation.  Constitutional: night sweats.    Vitals  BMI Calculated: 29.71 BSA Calculated: 1.85 Height: 5 ft 4 in Weight: 174 lb    Physical Exam Constitutional: Well nourished and well developed . No acute distress.  ENT:. The ears and nose are normal in appearance.  Neck: The appearance of the neck is normal and no neck mass is present.  Pulmonary: No respiratory distress, normal respiratory rhythm and effort and clear bilateral breath sounds.  Cardiovascular: Heart rate and rhythm are normal . No peripheral edema.  Abdomen: The abdomen is soft and nontender. No masses are palpated. No CVA tenderness. No hernias are palpable. No hepatosplenomegaly noted.  Lymphatics: The femoral and inguinal nodes are not enlarged or tender.  Skin: Normal skin turgor, no visible rash and no visible skin lesions.  Neuro/Psych:. Mood and affect are appropriate.     Assessment Assessed  1. Nephrolithiasis 592.0    Discussion/Summary  1.  Large left renal calculus with smaller lower pole fragments: I have reviewed options with Joanna [name] regarding treatment for her stone.  She has elected to proceed with left PCNL.  We have reviewed the potential risks, complications,  alternative options, and the expected recovery process for each of the above therapies.

## 2013-01-27 ENCOUNTER — Observation Stay (HOSPITAL_COMMUNITY)
Admission: RE | Admit: 2013-01-27 | Discharge: 2013-01-28 | Disposition: A | Discharge status: Home / Self Care | Payer: 59 | Source: Ambulatory Visit | Attending: Urology | Admitting: Urology

## 2013-01-27 ENCOUNTER — Encounter (HOSPITAL_COMMUNITY): Payer: Self-pay

## 2013-01-27 ENCOUNTER — Ambulatory Visit (HOSPITAL_COMMUNITY): Payer: 59

## 2013-01-27 ENCOUNTER — Ambulatory Visit (HOSPITAL_COMMUNITY)
Admission: RE | Admit: 2013-01-27 | Discharge: 2013-01-27 | Disposition: A | Discharge status: Home / Self Care | Payer: 59 | Source: Ambulatory Visit | Attending: Urology | Admitting: Urology

## 2013-01-27 ENCOUNTER — Encounter (HOSPITAL_COMMUNITY)
Admission: RE | Disposition: A | Discharge status: Home / Self Care | Payer: Self-pay | Source: Ambulatory Visit | Attending: Urology

## 2013-01-27 ENCOUNTER — Ambulatory Visit (HOSPITAL_COMMUNITY): Payer: 59 | Admitting: Certified Registered Nurse Anesthetist

## 2013-01-27 ENCOUNTER — Encounter (HOSPITAL_COMMUNITY): Payer: Self-pay | Admitting: Certified Registered Nurse Anesthetist

## 2013-01-27 VITALS — BP 133/84 | HR 69 | Resp 11

## 2013-01-27 DIAGNOSIS — E785 Hyperlipidemia, unspecified: Secondary | ICD-10-CM | POA: Insufficient documentation

## 2013-01-27 DIAGNOSIS — K589 Irritable bowel syndrome without diarrhea: Secondary | ICD-10-CM | POA: Insufficient documentation

## 2013-01-27 DIAGNOSIS — Z23 Encounter for immunization: Secondary | ICD-10-CM | POA: Insufficient documentation

## 2013-01-27 DIAGNOSIS — N200 Calculus of kidney: Secondary | ICD-10-CM

## 2013-01-27 DIAGNOSIS — E119 Type 2 diabetes mellitus without complications: Secondary | ICD-10-CM | POA: Insufficient documentation

## 2013-01-27 DIAGNOSIS — G4730 Sleep apnea, unspecified: Secondary | ICD-10-CM | POA: Insufficient documentation

## 2013-01-27 DIAGNOSIS — Z794 Long term (current) use of insulin: Secondary | ICD-10-CM | POA: Insufficient documentation

## 2013-01-27 DIAGNOSIS — Z79899 Other long term (current) drug therapy: Secondary | ICD-10-CM | POA: Insufficient documentation

## 2013-01-27 DIAGNOSIS — I10 Essential (primary) hypertension: Secondary | ICD-10-CM | POA: Insufficient documentation

## 2013-01-27 HISTORY — PX: NEPHROLITHOTOMY: SHX5134

## 2013-01-27 LAB — BASIC METABOLIC PANEL
GFR calc Af Amer: >90 mL/min (ref >90)
GFR calc non Af Amer: 90 mL/min — ABNORMAL LOW (ref >90)
Glucose, Bld: 155 mg/dL — ABNORMAL HIGH (ref 70–99)
Potassium: 3.1 mEq/L — ABNORMAL LOW (ref 3.5–5.1)
Sodium: 140 mEq/L (ref 135–145)

## 2013-01-27 LAB — GLUCOSE, CAPILLARY
Glucose-Capillary: 103 mg/dL — ABNORMAL HIGH (ref 70–99)
Glucose-Capillary: 203 mg/dL — ABNORMAL HIGH (ref 70–99)

## 2013-01-27 LAB — HEMOGLOBIN AND HEMATOCRIT, BLOOD
HCT: 33.5 % — ABNORMAL LOW (ref 36.0–46.0)
Hemoglobin: 10.9 g/dL — ABNORMAL LOW (ref 12.0–15.0)

## 2013-01-27 LAB — TYPE AND SCREEN

## 2013-01-27 SURGERY — NEPHROLITHOTOMY PERCUTANEOUS
Anesthesia: General | Laterality: Left | Wound class: Clean

## 2013-01-27 MED ORDER — PHENYLEPHRINE HCL 10 MG/ML IJ SOLN
INTRAMUSCULAR | Status: DC | PRN
  Administered 2013-01-27 (×2): 80 ug via INTRAVENOUS

## 2013-01-27 MED ORDER — ACETAMINOPHEN 325 MG PO TABS: 650.0000 mg | ORAL_TABLET | ORAL | Status: DC | PRN

## 2013-01-27 MED ORDER — IOHEXOL 300 MG/ML  SOLN
INTRAMUSCULAR | Status: DC | PRN
  Administered 2013-01-27: 10 mL

## 2013-01-27 MED ORDER — FENTANYL CITRATE 0.05 MG/ML IJ SOLN
INTRAMUSCULAR | Status: AC
  Administered 2013-01-27 (×4): 50 ug via INTRAVENOUS
  Filled 2013-01-27: qty 6

## 2013-01-27 MED ORDER — HYDROMORPHONE HCL PF 1 MG/ML IJ SOLN
0.2500 mg | INTRAMUSCULAR | Status: DC | PRN
  Administered 2013-01-27 (×3): 0.5 mg via INTRAVENOUS

## 2013-01-27 MED ORDER — PNEUMOCOCCAL VAC POLYVALENT 25 MCG/0.5ML IJ INJ
0.5000 mL | INJECTION | INTRAMUSCULAR | Status: AC
  Administered 2013-01-28: 0.5 mL via INTRAMUSCULAR
  Filled 2013-01-27 (×2): qty 0.5

## 2013-01-27 MED ORDER — CIPROFLOXACIN IN D5W 400 MG/200ML IV SOLN
400.0000 mg | INTRAVENOUS | Status: AC
  Administered 2013-01-27: 400 mg via INTRAVENOUS

## 2013-01-27 MED ORDER — MEPERIDINE HCL 50 MG/ML IJ SOLN
6.2500 mg | INTRAMUSCULAR | Status: DC | PRN
  Administered 2013-01-27: 12.5 mg via INTRAVENOUS

## 2013-01-27 MED ORDER — POTASSIUM CHLORIDE CRYS ER 20 MEQ PO TBCR
40.0000 meq | EXTENDED_RELEASE_TABLET | Freq: Once | ORAL | Status: AC
  Administered 2013-01-27: 40 meq via ORAL
  Filled 2013-01-27: qty 2

## 2013-01-27 MED ORDER — FENTANYL CITRATE 0.05 MG/ML IJ SOLN
25.0000 ug | INTRAMUSCULAR | Status: DC | PRN
  Administered 2013-01-27 (×3): 50 ug via INTRAVENOUS

## 2013-01-27 MED ORDER — LACTATED RINGERS IV SOLN
INTRAVENOUS | Status: DC
  Administered 2013-01-27: 17:00:00 via INTRAVENOUS

## 2013-01-27 MED ORDER — BIOTENE DRY MOUTH MT LIQD
15.0000 mL | Freq: Two times a day (BID) | OROMUCOSAL | Status: DC
  Administered 2013-01-27 – 2013-01-28 (×2): 15 mL via OROMUCOSAL

## 2013-01-27 MED ORDER — FENTANYL CITRATE 0.05 MG/ML IJ SOLN
INTRAMUSCULAR | Status: AC | PRN
  Administered 2013-01-27: 100 ug via INTRAVENOUS
  Administered 2013-01-27 (×2): 50 ug via INTRAVENOUS

## 2013-01-27 MED ORDER — LACTATED RINGERS IV SOLN
INTRAVENOUS | Status: DC
  Administered 2013-01-27: 1000 mL via INTRAVENOUS
  Administered 2013-01-27 (×2): via INTRAVENOUS

## 2013-01-27 MED ORDER — HYDROMORPHONE HCL PF 1 MG/ML IJ SOLN
INTRAMUSCULAR | Status: AC
  Filled 2013-01-27: qty 1

## 2013-01-27 MED ORDER — STERILE WATER FOR IRRIGATION IR SOLN
Status: DC | PRN
  Administered 2013-01-27: 1000 mL

## 2013-01-27 MED ORDER — POTASSIUM CHLORIDE IN NACL 20-0.45 MEQ/L-% IV SOLN
INTRAVENOUS | Status: DC
  Administered 2013-01-27 – 2013-01-28 (×3): via INTRAVENOUS
  Filled 2013-01-27 (×4): qty 1000

## 2013-01-27 MED ORDER — MEPERIDINE HCL 50 MG/ML IJ SOLN
INTRAMUSCULAR | Status: AC
  Filled 2013-01-27: qty 1

## 2013-01-27 MED ORDER — LEVOTHYROXINE SODIUM 112 MCG PO TABS
112.0000 ug | ORAL_TABLET | Freq: Every day | ORAL | Status: DC
Start: 2013-01-28 — End: 2013-01-28
  Administered 2013-01-28: 112 ug via ORAL
  Filled 2013-01-27 (×2): qty 1

## 2013-01-27 MED ORDER — CIPROFLOXACIN IN D5W 400 MG/200ML IV SOLN
INTRAVENOUS | Status: AC
  Filled 2013-01-27: qty 200

## 2013-01-27 MED ORDER — FENTANYL CITRATE 0.05 MG/ML IJ SOLN
INTRAMUSCULAR | Status: AC
  Filled 2013-01-27: qty 2

## 2013-01-27 MED ORDER — 0.9 % SODIUM CHLORIDE (POUR BTL) OPTIME
TOPICAL | Status: DC | PRN
  Administered 2013-01-27: 1000 mL

## 2013-01-27 MED ORDER — RAMIPRIL 2.5 MG PO CAPS
2.5000 mg | ORAL_CAPSULE | Freq: Every morning | ORAL | Status: DC
  Administered 2013-01-28: 2.5 mg via ORAL
  Filled 2013-01-27: qty 1

## 2013-01-27 MED ORDER — OXYCODONE-ACETAMINOPHEN 5-325 MG PO TABS: 1.0000 | ORAL_TABLET | ORAL | Status: DC | PRN

## 2013-01-27 MED ORDER — ROCURONIUM BROMIDE 100 MG/10ML IV SOLN
INTRAVENOUS | Status: DC | PRN
  Administered 2013-01-27: 30 mg via INTRAVENOUS

## 2013-01-27 MED ORDER — PROMETHAZINE HCL 25 MG/ML IJ SOLN: 6.2500 mg | INTRAMUSCULAR | Status: DC | PRN

## 2013-01-27 MED ORDER — DOCUSATE SODIUM 100 MG PO CAPS
100.0000 mg | ORAL_CAPSULE | Freq: Two times a day (BID) | ORAL | Status: DC
  Administered 2013-01-27 – 2013-01-28 (×2): 100 mg via ORAL
  Filled 2013-01-27 (×3): qty 1

## 2013-01-27 MED ORDER — INSULIN ASPART 100 UNIT/ML ~~LOC~~ SOLN
0.0000 [IU] | SUBCUTANEOUS | Status: DC
  Administered 2013-01-27: 21:00:00 5 [IU] via SUBCUTANEOUS
  Administered 2013-01-28 (×2): 3 [IU] via SUBCUTANEOUS

## 2013-01-27 MED ORDER — IOHEXOL 300 MG/ML  SOLN
10.0000 mL | Freq: Once | INTRAMUSCULAR | Status: AC | PRN
  Administered 2013-01-27: 1 mL

## 2013-01-27 MED ORDER — NEOSTIGMINE METHYLSULFATE 1 MG/ML IJ SOLN
INTRAMUSCULAR | Status: DC | PRN
  Administered 2013-01-27: 3 mg via INTRAVENOUS

## 2013-01-27 MED ORDER — GLYCOPYRROLATE 0.2 MG/ML IJ SOLN
INTRAMUSCULAR | Status: DC | PRN
  Administered 2013-01-27: 0.4 mg via INTRAVENOUS

## 2013-01-27 MED ORDER — MIDAZOLAM HCL 2 MG/2ML IJ SOLN
INTRAMUSCULAR | Status: AC
  Filled 2013-01-27: qty 6

## 2013-01-27 MED ORDER — OXYCODONE-ACETAMINOPHEN 5-325 MG PO TABS
1.0000 | ORAL_TABLET | ORAL | Status: DC | PRN
  Administered 2013-01-28: 2 via ORAL
  Administered 2013-01-28: 1 via ORAL
  Filled 2013-01-27: qty 2
  Filled 2013-01-27: qty 1

## 2013-01-27 MED ORDER — SUCCINYLCHOLINE CHLORIDE 20 MG/ML IJ SOLN
INTRAMUSCULAR | Status: DC | PRN
  Administered 2013-01-27: 100 mg via INTRAVENOUS

## 2013-01-27 MED ORDER — SODIUM CHLORIDE 0.45 % IV SOLN: INTRAVENOUS | Status: DC

## 2013-01-27 MED ORDER — ZOLPIDEM TARTRATE 5 MG PO TABS: 5.0000 mg | ORAL_TABLET | Freq: Every evening | ORAL | Status: DC | PRN

## 2013-01-27 MED ORDER — SODIUM CHLORIDE 0.9 % IV SOLN
INTRAVENOUS | Status: DC
  Administered 2013-01-27: 12:00:00 via INTRAVENOUS

## 2013-01-27 MED ORDER — ONDANSETRON HCL 4 MG/2ML IJ SOLN
INTRAMUSCULAR | Status: DC | PRN
  Administered 2013-01-27: 4 mg via INTRAVENOUS

## 2013-01-27 MED ORDER — ONDANSETRON HCL 4 MG/2ML IJ SOLN
4.0000 mg | INTRAMUSCULAR | Status: DC | PRN
  Administered 2013-01-27 – 2013-01-28 (×2): 4 mg via INTRAVENOUS
  Filled 2013-01-27 (×2): qty 2

## 2013-01-27 MED ORDER — CIPROFLOXACIN IN D5W 400 MG/200ML IV SOLN: 400.0000 mg | INTRAVENOUS | Status: DC

## 2013-01-27 MED ORDER — SODIUM CHLORIDE 0.9 % IR SOLN
Status: DC | PRN
  Administered 2013-01-27: 12000 mL

## 2013-01-27 MED ORDER — CIPROFLOXACIN IN D5W 400 MG/200ML IV SOLN
400.0000 mg | Freq: Two times a day (BID) | INTRAVENOUS | Status: AC
  Administered 2013-01-27 – 2013-01-28 (×2): 400 mg via INTRAVENOUS
  Filled 2013-01-27 (×2): qty 200

## 2013-01-27 MED ORDER — MIDAZOLAM HCL 2 MG/2ML IJ SOLN
INTRAMUSCULAR | Status: AC | PRN
  Administered 2013-01-27 (×3): 1 mg via INTRAVENOUS

## 2013-01-27 MED ORDER — PROPOFOL 10 MG/ML IV BOLUS
INTRAVENOUS | Status: DC | PRN
  Administered 2013-01-27: 200 mg via INTRAVENOUS

## 2013-01-27 MED ORDER — HYDROMORPHONE HCL PF 1 MG/ML IJ SOLN
0.5000 mg | INTRAMUSCULAR | Status: DC | PRN
  Administered 2013-01-27 (×3): 0.5 mg via INTRAVENOUS
  Administered 2013-01-28 (×2): 1 mg via INTRAVENOUS
  Administered 2013-01-28: 0.5 mg via INTRAVENOUS
  Filled 2013-01-27 (×6): qty 1

## 2013-01-27 MED ORDER — DOCUSATE SODIUM 100 MG PO CAPS: 100.0000 mg | ORAL_CAPSULE | Freq: Two times a day (BID) | ORAL | Status: DC

## 2013-01-27 MED ORDER — LIDOCAINE HCL (CARDIAC) 20 MG/ML IV SOLN
INTRAVENOUS | Status: DC | PRN
  Administered 2013-01-27: 50 mg via INTRAVENOUS

## 2013-01-27 SURGICAL SUPPLY — 56 items
APL SKNCLS STERI-STRIP NONHPOA (GAUZE/BANDAGES/DRESSINGS) ×1
BAG URINE DRAINAGE (UROLOGICAL SUPPLIES) ×2 IMPLANT
BAG URO CATCHER STRL LF (DRAPE) ×2 IMPLANT
BASKET STONE NITINOL 3FRX115MB (UROLOGICAL SUPPLIES) IMPLANT
BASKET ZERO TIP NITINOL 2.4FR (BASKET) IMPLANT
BENZOIN TINCTURE PRP APPL 2/3 (GAUZE/BANDAGES/DRESSINGS) ×3 IMPLANT
BSKT STON RTRVL ZERO TP 2.4FR (BASKET)
CATCHER STONE W/TUBE ADAPTER (UROLOGICAL SUPPLIES) ×1 IMPLANT
CATH COUNCIL 22FR (CATHETERS) ×1 IMPLANT
CATH FOLEY 2W COUNCIL 20FR 5CC (CATHETERS) IMPLANT
CATH INTERMIT  6FR 70CM (CATHETERS) IMPLANT
CATH ROBINSON RED A/P 20FR (CATHETERS) IMPLANT
CATH X-FORCE N30 NEPHROSTOMY (TUBING) ×2 IMPLANT
CLOTH BEACON ORANGE TIMEOUT ST (SAFETY) ×2 IMPLANT
COVER SURGICAL LIGHT HANDLE (MISCELLANEOUS) ×2 IMPLANT
DRAPE C-ARM 42X120 X-RAY (DRAPES) ×2 IMPLANT
DRAPE CAMERA CLOSED 9X96 (DRAPES) ×2 IMPLANT
DRAPE LINGEMAN PERC (DRAPES) ×2 IMPLANT
DRAPE SURG IRRIG POUCH 19X23 (DRAPES) ×2 IMPLANT
DRAPE UTILITY XL STRL (DRAPES) ×2 IMPLANT
DRSG PAD ABDOMINAL 8X10 ST (GAUZE/BANDAGES/DRESSINGS) ×1 IMPLANT
DRSG TEGADERM 8X12 (GAUZE/BANDAGES/DRESSINGS) ×4 IMPLANT
GLOVE BIOGEL M STRL SZ7.5 (GLOVE) ×2 IMPLANT
GOWN PREVENTION PLUS XLARGE (GOWN DISPOSABLE) ×2 IMPLANT
GOWN STRL NON-REIN LRG LVL3 (GOWN DISPOSABLE) ×3 IMPLANT
GUIDEWIRE AMPLATZ STIFF 0.35 (WIRE) ×2 IMPLANT
GUIDEWIRE ANG ZIPWIRE 038X150 (WIRE) ×1 IMPLANT
GUIDEWIRE STR DUAL SENSOR (WIRE) ×2 IMPLANT
KIT BASIN OR (CUSTOM PROCEDURE TRAY) ×2 IMPLANT
LASER FIBER DISP (UROLOGICAL SUPPLIES) IMPLANT
LASER FIBER DISP 1000U (UROLOGICAL SUPPLIES) IMPLANT
MANIFOLD NEPTUNE II (INSTRUMENTS) ×2 IMPLANT
NS IRRIG 1000ML POUR BTL (IV SOLUTION) ×1 IMPLANT
PACK BASIC VI WITH GOWN DISP (CUSTOM PROCEDURE TRAY) ×2 IMPLANT
PACK CYSTO (CUSTOM PROCEDURE TRAY) ×2 IMPLANT
PAD ABD 7.5X8 STRL (GAUZE/BANDAGES/DRESSINGS) ×4 IMPLANT
PROBE LITHOCLAST ULTRA 3.8X403 (UROLOGICAL SUPPLIES) ×1 IMPLANT
PROBE PNEUMATIC 1.0MMX570MM (UROLOGICAL SUPPLIES) ×2 IMPLANT
SET IRRIG Y TYPE TUR BLADDER L (SET/KITS/TRAYS/PACK) ×1 IMPLANT
SET WARMING FLUID IRRIGATION (MISCELLANEOUS) ×1 IMPLANT
SPONGE GAUZE 4X4 12PLY (GAUZE/BANDAGES/DRESSINGS) ×2 IMPLANT
SPONGE LAP 4X18 X RAY DECT (DISPOSABLE) ×2 IMPLANT
STENT ENDOURETEROTOMY 7-14 26C (STENTS) IMPLANT
STONE CATCHER W/TUBE ADAPTER (UROLOGICAL SUPPLIES) ×2 IMPLANT
SUT SILK 0 CT 1 30 (SUTURE) ×1 IMPLANT
SUT SILK 2 0 30  PSL (SUTURE) ×1
SUT SILK 2 0 30 PSL (SUTURE) ×1 IMPLANT
SYR 20CC LL (SYRINGE) ×3 IMPLANT
SYR CONTROL 10ML LL (SYRINGE) ×1 IMPLANT
SYRINGE 10CC LL (SYRINGE) ×2 IMPLANT
SYRINGE IRR TOOMEY STRL 70CC (SYRINGE) ×1 IMPLANT
TAPE CLOTH SURG 4X10 WHT LF (GAUZE/BANDAGES/DRESSINGS) ×1 IMPLANT
TOWEL OR NON WOVEN STRL DISP B (DISPOSABLE) ×2 IMPLANT
TRAY FOLEY CATH 14FRSI W/METER (CATHETERS) ×2 IMPLANT
TUBING CONNECTING 10 (TUBING) ×5 IMPLANT
WATER STERILE IRR 1500ML POUR (IV SOLUTION) ×1 IMPLANT

## 2013-01-27 NOTE — Transfer of Care (Signed)
Immediate Anesthesia Transfer of Care Note  Patient: Joanna [name]  Procedure(s) Performed: Procedure(s): NEPHROLITHOTOMY PERCUTANEOUS (Left)  Patient Location: PACU  Anesthesia Type:General  Level of Consciousness: awake and sedated  Airway & Oxygen Therapy: Patient Spontanous Breathing and Patient connected to face mask oxygen  Post-op Assessment: Report given to PACU RN and Post -op Vital signs reviewed and stable  Post vital signs: Reviewed and stable  Complications: No apparent anesthesia complications

## 2013-01-27 NOTE — Anesthesia Preprocedure Evaluation (Signed)
Anesthesia Evaluation  Patient identified by MRN, date of birth, ID band Patient awake    Reviewed: Allergy & Precautions, H&P , NPO status , Patient's Chart, lab work & pertinent test results  Airway Mallampati: II TM Distance: >3 FB Neck ROM: Full    Dental no notable dental hx.    Pulmonary sleep apnea and Continuous Positive Airway Pressure Ventilation ,  breath sounds clear to auscultation  Pulmonary exam normal       Cardiovascular hypertension, Pt. on medications Rhythm:Regular Rate:Normal     Neuro/Psych negative neurological ROS  negative psych ROS   GI/Hepatic negative GI ROS, Neg liver ROS,   Endo/Other  diabetes, Type 2, Oral Hypoglycemic Agents  Renal/GU negative Renal ROS  negative genitourinary   Musculoskeletal negative musculoskeletal ROS (+)   Abdominal   Peds negative pediatric ROS (+)  Hematology negative hematology ROS (+)   Anesthesia Other Findings   Reproductive/Obstetrics negative OB ROS                           Anesthesia Physical Anesthesia Plan  ASA: II  Anesthesia Plan: General   Post-op Pain Management:    Induction: Intravenous  Airway Management Planned: Oral ETT  Additional Equipment:   Intra-op Plan:   Post-operative Plan: Extubation in OR  Informed Consent: I have reviewed the patients History and Physical, chart, labs and discussed the procedure including the risks, benefits and alternatives for the proposed anesthesia with the patient or authorized representative who has indicated his/her understanding and acceptance.   Dental advisory given  Plan Discussed with: CRNA  Anesthesia Plan Comments:         Anesthesia Quick Evaluation

## 2013-01-27 NOTE — H&P (Signed)
Agree 

## 2013-01-27 NOTE — Progress Notes (Signed)
Demerol 12.5mg IVP given for shaking and shivering. 

## 2013-01-27 NOTE — Procedures (Signed)
Procedure:  Left perc nephrostomy access with ureteral catheter placement Findings:  After LP access, 5 Fr catheter advanced into ureter then bladder.  Access to be utilized during percutaneous nephrolithotomy in OR by Dr. Laverle Patter.

## 2013-01-27 NOTE — Progress Notes (Signed)
Hgb. And Hct. And B Met drawn by lab. 

## 2013-01-27 NOTE — H&P (Signed)
Joanna [name] is an 62 y.o. female.   Chief Complaint: left kidney stones HPI: Patient with history of left nephrolithiasis presents today for left percutaneous nephrostomy prior to nephrolithotomy.  Past Medical History  Diagnosis Date  . Hyperlipidemia   . Foot injury 08    Left foot/leg with ?? torn muscle -PROBLEM HAS RESOLVED  . Hand pain 1/09    steroid injections--RESOLVED  . Adenomatous colon polyp   . Internal hemorrhoids   . IBS (irritable bowel syndrome)   . Hypertension   . Hypothyroidism     pt states hx of thyroid nodules  . Sleep apnea     USES CPAP - SETTING IS 14  . Diabetes mellitus     type II--ORAL MEDICATION - NO INSULIN  . Hx of renal calculi     LEFT    Past Surgical History  Procedure Laterality Date  . Colonoscopy w/ biopsies and polypectomy  05/19/2005    adenomatous polyps  . Tubal ligation    . Wisdom teeth extracted      No family history on file. Social History:  reports that she has never smoked. She has never used smokeless tobacco. She reports that she does not drink alcohol or use illicit drugs.  Allergies:  Allergies  Allergen Reactions  . Atorvastatin     REACTION: Hot flashes and flu-like symptoms  . Niacin     REACTION: n/v  . Pravastatin Sodium     REACTION: Neck swelling and pain in her shoulders and arms.  . Rosuvastatin     REACTION: N/V and heartburn  . Simvastatin     REACTION: GI  . Sulfonamide Derivatives     Rxn Unknown    Current facility-administered medications:0.9 %  sodium chloride infusion, , Intravenous, Continuous, D Kevin Jahni Paul, PA-C;  ciprofloxacin (CIPRO) IVPB 400 mg, 400 mg, Intravenous, On Call, D Jeananne Rama, PA-C No current outpatient prescriptions on file.  Results for orders placed during the hospital encounter of 01/14/13  BASIC METABOLIC PANEL      Result Value Range   Sodium 137  135 - 145 mEq/L   Potassium 4.2  3.5 - 5.1 mEq/L   Chloride 97  96 - 112 mEq/L   CO2 27  19 - 32 mEq/L   Glucose, Bld 97  70 - 99 mg/dL   BUN 12  6 - 23 mg/dL   Creatinine, Ser 8.41  0.50 - 1.10 mg/dL   Calcium 32.4  8.4 - 40.1 mg/dL   GFR calc non Af Amer >90  >90 mL/min   GFR calc Af Amer >90  >90 mL/min  CBC      Result Value Range   WBC 6.4  4.0 - 10.5 K/uL   RBC 4.66  3.87 - 5.11 MIL/uL   Hemoglobin 13.6  12.0 - 15.0 g/dL   HCT 02.7  25.3 - 66.4 %   MCV 85.0  78.0 - 100.0 fL   MCH 29.2  26.0 - 34.0 pg   MCHC 34.3  30.0 - 36.0 g/dL   RDW 40.3  47.4 - 25.9 %   Platelets 265  150 - 400 K/uL  PROTIME-INR      Result Value Range   Prothrombin Time 12.6  11.6 - 15.2 seconds   INR 0.96  0.00 - 1.49  APTT      Result Value Range   aPTT 33  24 - 37 seconds    Review of Systems  Constitutional: Negative for fever and chills.  Respiratory: Negative for cough and shortness of breath.   Cardiovascular: Negative for chest pain.  Gastrointestinal: Negative for nausea, vomiting, abdominal pain and blood in stool.  Genitourinary: Positive for flank pain. Negative for dysuria and hematuria.  Musculoskeletal: Positive for back pain.  Neurological: Negative for headaches.  Endo/Heme/Allergies: Does not bruise/bleed easily.   Vitals:   BP 140/84  HR 63  R 18  TEMP 97.1  O2 SATS 98% RA Physical Exam  Constitutional: She is oriented to person, place, and time. She appears well-developed and well-nourished.  Cardiovascular: Normal rate and regular rhythm.   Respiratory: Effort normal and breath sounds normal.  GI: Soft. Bowel sounds are normal.  Left CVA tenderness  Musculoskeletal: Normal range of motion. She exhibits no edema.  Neurological: She is alert and oriented to person, place, and time.     Assessment/Plan Pt with hx of left nephrolithiasis. Plan is for left PCN prior to nephrolithotomy today. Details/risks of procedure d/w pt/family with their understanding and consent.  [name],D KEVIN 01/27/2013, 11:33 AM

## 2013-01-27 NOTE — Progress Notes (Signed)
4TH FLOOR R.N. To call M.D. In regards to K results on lab  Work drawn in PACU.

## 2013-01-27 NOTE — Progress Notes (Signed)
Patient ID: Joanna [name], female   DOB: 02-23-1951, 62 y.o.   MRN: [id number]  Post-op note  Subjective: The patient is doing well.  No complaints.  Objective: Vital signs in last 24 hours: Temp:  [96 F (35.6 C)-98.4 F (36.9 C)] 98.4 F (36.9 C) (09/08 1842) Pulse Rate:  [60-96] 93 (09/08 1842) Resp:  [9-19] 18 (09/08 1842) BP: (133-167)/(79-100) 151/88 mmHg (09/08 1842) SpO2:  [95 %-100 %] 95 % (09/08 1842) Weight:  [77.111 kg (170 lb)] 77.111 kg (170 lb) (09/08 1900)   Physical Exam:  General: Alert and oriented. Abdomen: Soft, Nondistended. Incisions: Clean and dry.  Lab Results:  Recent Labs  01/27/13 1731  HGB 10.9*  HCT 33.5*    Assessment/Plan: POD#0   1) Continue to monitor   Moody Bruins. MD   LOS: 0 days   Malakhai Beitler,LES 01/27/2013, 7:34 PM

## 2013-01-27 NOTE — Anesthesia Postprocedure Evaluation (Signed)
  Anesthesia Post-op Note  Patient: Joanna [name]  Procedure(s) Performed: Procedure(s) (LRB): NEPHROLITHOTOMY PERCUTANEOUS (Left)  Patient Location: PACU  Anesthesia Type: General  Level of Consciousness: awake and alert   Airway and Oxygen Therapy: Patient Spontanous Breathing  Post-op Pain: mild  Post-op Assessment: Post-op Vital signs reviewed, Patient's Cardiovascular Status Stable, Respiratory Function Stable, Patent Airway and No signs of Nausea or vomiting  Last Vitals:  Filed Vitals:   01/27/13 1730  BP: 160/93  Pulse: 83  Temp: 36.4 C  Resp: 18    Post-op Vital Signs: stable   Complications: No apparent anesthesia complications

## 2013-01-27 NOTE — Progress Notes (Signed)
Shaking and shivering stopped. 

## 2013-01-27 NOTE — Op Note (Signed)
Preoperative diagnosis:  1. Left renal calculi (> 2 cm)  Postoperative diagnosis: 1. Left renal calculi ( > 2 cm)  Procedure(s): 1. Dilation of percutaneous nephrostomy tract 2.  Left percutaneous nephrostolithotomy (> 2 cm), 1st stage  Surgeon: Dr. Rolly Salter, Jr  Anesthesia: General  Complications: None  EBL: 300 cc  Specimens: Left renal calculi  Indication: Joanna [name] is a 62 year old female who presented with symptomatic left renal calculi.  Her CT scan demonstrated a large burden of left renal calculi with a total burden measuring over 2 cm.  After discussing management options for treatment, she elected to proceed with left percutaneous nephrostolithotomy.  The potential risks, complications, alternative options, and expected recovery process were discussed in detail.  Informed consent was obtained.  Description of procedure:  After lower pole left percutaneous renal access was obtained by Dr. Fredia Sorrow, the patient was brought to the operating room and a general anesthetic was administered.  She was given preoperative antibiotics, placed in the prone position with care to pad all potential pressure points, and prepped and draped in the usual sterile fashion.  Next, a preoperative timeout was performed.  Prior to the procedure, the patient was noted to have fairly significant hematuria with some bleeding from around the access site.She did have a nephroureteral catheter in place.  Under fluoroscopic guidance, a point 0 3 8 stiff guidewire was inserted down the catheter and into the bladder.  The catheter was then removed.  A coaxial catheter was then placed over this wire into the proximal ureter and a Glidewire was then inserted down into the pelvis and into the bladder under fluoroscopic guidance.  The coaxial catheter was then removed.  The nephroureteral catheter was then replaced over the Glidewire and the Glidewire was exchanged for a another stiff guidewire.  One of the  guidewires was used as a safety wire and the other wire was used as a working wire.  The 30 French nephrostomy balloon dilator was then placed over the working wire and positioned appropriately across the renal parenchyma into a lower pole calyx.  The balloon was then dilated and a nephrostomy sheath was placed over the bleeding to gain access to the kidney.  The rigid nephroscope was then inserted into the nephrostomy sheath.  There was noted to be fairly significant bleeding initially.  Multiple clots were removed and visualization was eventually adequate enough to proceed with the procedure.  There was noted to be a stone in the renal pelvis which was able to be fragmented with ultrasonic lithotripsy.  An additional smaller fragment was identified in the lower pole. This also was removed.  Although visualization was suboptimal, there were no obvious stone fragments remaining at the end of the procedure after close inspection of the entire renal collecting system.  I did not perform flexible nephroscopy considering the poor visualization.  A 22 French Councill catheter was then placed over the working wire into the renal pelvis and the nephrostomy sheath was removed.  There was some oozing and bleeding from around the nephrostomy tube.  A nephroureteral catheter was placed over the safety wire down into the distal ureter under fluoroscopic guidance.  Using 2-0 silk sutures, both the council catheter and the nephroureteral catheter were secured to the skin site. This also resulted in adequate hemostasis.  A pressure dressing was applied.  The Councill catheter was placed to straight drainage.  The patient appeared to tolerate the procedure well.  She was able to be awakened and  transferred to the recovery unit in satisfactory condition.

## 2013-01-28 ENCOUNTER — Encounter (HOSPITAL_COMMUNITY): Payer: Self-pay | Admitting: Urology

## 2013-01-28 ENCOUNTER — Ambulatory Visit (HOSPITAL_COMMUNITY): Payer: 59

## 2013-01-28 LAB — BASIC METABOLIC PANEL
Chloride: 101 mEq/L (ref 96–112)
GFR calc Af Amer: >90 mL/min (ref >90)
GFR calc non Af Amer: >90 mL/min (ref >90)
Glucose, Bld: 125 mg/dL — ABNORMAL HIGH (ref 70–99)
Potassium: 4.2 mEq/L (ref 3.5–5.1)
Sodium: 135 mEq/L (ref 135–145)

## 2013-01-28 LAB — HEMOGLOBIN AND HEMATOCRIT, BLOOD
HCT: 28.5 % — ABNORMAL LOW (ref 36.0–46.0)
Hemoglobin: 9.5 g/dL — ABNORMAL LOW (ref 12.0–15.0)

## 2013-01-28 NOTE — Progress Notes (Signed)
Nephrostomy dressing re-enforced after moderate saturation. Slight decrease in nephrosotomy output, neph tube irrigated with 20cc saline, multiple small dime sized clots removed. neph tube draining well. Will cont to monitor.  Earnest Conroy. Clelia Croft, RN

## 2013-01-28 NOTE — Care Management Note (Signed)
    Page 1 of 1   01/28/2013     12:03:29 PM   CARE MANAGEMENT NOTE 01/28/2013  Patient:  Joanna [name], Joanna [name]   Account Number:  [id number]  Date Initiated:  01/28/2013  Documentation initiated by:  Lanier Clam  Subjective/Objective Assessment:   62 Y/O F ADMITTED W/L RENAL CALCULI.     Action/Plan:   FROM HOME.   Anticipated DC Date:  01/28/2013   Anticipated DC Plan:  HOME/SELF CARE      DC Planning Services  CM consult      Choice offered to / List presented to:             Status of service:  In process, will continue to follow Medicare Important Message given?   (If response is "NO", the following Medicare IM given date fields will be blank) Date Medicare IM given:   Date Additional Medicare IM given:    Discharge Disposition:    Per UR Regulation:  Reviewed for med. necessity/level of care/duration of stay  If discussed at Long Length of Stay Meetings, dates discussed:    Comments:  01/28/13 Tidus Upchurch RN,BSN NCM [phone] POD#1 L PCN.NO ANTICIPATED D/C NEEDS.

## 2013-01-28 NOTE — Progress Notes (Signed)
Patient ID: Joanna [name], female   DOB: Oct 11, 1950, 62 y.o.   MRN: [id number]  Pt with stable Hgb and CT without residual stone fragments in left kidney.  Will plan to d/c home with nephrostomy tube.  She will f/u in 1 week with a nephrostogram and possible removal of her nephrostomy tube.

## 2013-01-28 NOTE — Progress Notes (Signed)
Patient ID: Joanna [name], female   DOB: 1950-12-05, 62 y.o.   MRN: [id number]  1 Day Post-Op Subjective: Pt doing well.  Pain controlled.  Urine has cleared overnight.  Objective: Vital signs in last 24 hours: Temp:  [96 F (35.6 C)-98.4 F (36.9 C)] 98.4 F (36.9 C) (09/09 0535) Pulse Rate:  [60-96] 77 (09/09 0535) Resp:  [9-19] 16 (09/09 0535) BP: (116-167)/(61-100) 116/61 mmHg (09/09 0535) SpO2:  [95 %-100 %] 98 % (09/09 0535) Weight:  [77.111 kg (170 lb)] 77.111 kg (170 lb) (09/08 1900)  Intake/Output from previous day: 09/08 0701 - 09/09 0700 In: 3735.8 [P.O.:120; I.V.:3595.8] Out: 1815 [Urine:1515; Blood:300] Intake/Output this shift:    Physical Exam:  General: Alert and oriented CV: RRR Lungs: Clear Abdomen: Soft, ND, L PCN draining well and mostly clear GU: Foley with mostly clear urine now Ext: NT, No erythema  Lab Results:  Recent Labs  01/27/13 1731 01/28/13 0445  HGB 10.9* 9.6*  HCT 33.5* 28.5*   BMET  Recent Labs  01/27/13 1731 01/28/13 0445  NA 140 135  K 3.1* 4.2  CL 105 101  CO2 26 30  GLUCOSE 155* 125*  BUN 10 9  CREATININE 0.76 0.72  CALCIUM 8.3* 8.4     Studies/Results:  Assessment/Plan: 1) L renal calculi: Will check CT of abdomen without contrast today to assess for residual stone fragments since visualization at time of procedure was somewhat poor.  If significant residual stones present, will discuss 2nd look procedure.  2) Post-op care: Her Hgb did decrease although may be dilutional.  Other HD parameters very stable.  Will recheck Hgb later today.  3) Disposition: If Hgb stabilized, will reassess and likely D/C home later today with nephrostomy tube considering the fact there was some bleeding during the procedures yesterday.    LOS: 1 day   Clenton Esper,LES 01/28/2013, 7:33 AM

## 2013-01-28 NOTE — Discharge Summary (Signed)
  Date of admission: 01/27/2013  Date of discharge: 01/28/2013  Admission diagnosis: Left renal calculi  Discharge diagnosis: Left renal calculi  Secondary diagnoses: Diabetes Mellitus  History and Physical: For full details, please see admission history and physical. Briefly, Joanna [name] is a 62 y.o. year old patient with left renal calculi.   Hospital Course: She underwent left PCNL on 01/27/13 for treatment of her renal calculi.  She did have significant hematuria and was monitored overnight.  Her urine cleared and her Hgb stabilized on POD#1.  A CT of the abdomen demonstrated no residual left renal calculi.  Her pain was controlled and she was felt stable for discharge home.  Laboratory values:  Recent Labs  01/27/13 1731 01/28/13 0445 01/28/13 1125  HGB 10.9* 9.6* 9.5*  HCT 33.5* 28.5* 28.8*    Recent Labs  01/27/13 1731 01/28/13 0445  CREATININE 0.76 0.72    Disposition: Home  Discharge instruction: The patient was instructed to be ambulatory but told to refrain from heavy lifting, strenuous activity, or driving.  Discharge medications:    Medication List         acidophilus Caps capsule  Take 1 capsule by mouth daily.     docusate sodium 100 MG capsule  Commonly known as:  COLACE  Take 1 capsule (100 mg total) by mouth 2 (two) times daily.     levothyroxine 112 MCG tablet  Commonly known as:  SYNTHROID, LEVOTHROID  Take 112 mcg by mouth daily before breakfast.     metFORMIN 1000 MG tablet  Commonly known as:  GLUCOPHAGE  Take 1,000 mg by mouth 2 (two) times daily with a meal.     oxyCODONE-acetaminophen 5-325 MG per tablet  Commonly known as:  ROXICET  Take 1 tablet by mouth every 4 (four) hours as needed for pain.     ramipril 2.5 MG capsule  Commonly known as:  ALTACE  Take 2.5 mg by mouth every morning.        Followup:      Follow-up Information   Follow up with Elouise Divelbiss,LES, MD. (02/25/13 at 10:30 AM  (Will also call for sooner appt for  removal of nephrostomy tube))    Specialty:  Urology   Contact information:   [address] [place] [address] [phone]

## 2013-01-30 ENCOUNTER — Other Ambulatory Visit (HOSPITAL_COMMUNITY): Payer: Self-pay | Admitting: Urology

## 2013-01-30 DIAGNOSIS — N200 Calculus of kidney: Secondary | ICD-10-CM

## 2013-02-01 ENCOUNTER — Other Ambulatory Visit: Payer: Self-pay | Admitting: Radiology

## 2013-02-03 ENCOUNTER — Ambulatory Visit (HOSPITAL_COMMUNITY)
Admission: RE | Admit: 2013-02-03 | Discharge: 2013-02-03 | Disposition: A | Discharge status: Home / Self Care | Payer: 59 | Source: Ambulatory Visit | Attending: Urology | Admitting: Urology

## 2013-02-03 DIAGNOSIS — N200 Calculus of kidney: Secondary | ICD-10-CM | POA: Insufficient documentation

## 2013-02-03 MED ORDER — IOHEXOL 300 MG/ML  SOLN
50.0000 mL | Freq: Once | INTRAMUSCULAR | Status: AC | PRN
  Administered 2013-02-03: 15 mL via INTRAVENOUS

## 2013-02-04 ENCOUNTER — Telehealth: Payer: Self-pay | Admitting: Internal Medicine

## 2013-02-04 NOTE — Telephone Encounter (Signed)
Per CY-Dr Haroldine Laws ordered the original sleep study. Did he start the CPAP or who did? Also, we have NEVER seen this patient and she must follow up with the MD that started the CPAP or establish here with ANY sleep doctor.

## 2013-02-04 NOTE — Telephone Encounter (Signed)
No documentation in Epic OR [facility] where this patient has been seen, although CY read the 08/2005 sleep study  Called spoke with patient who reported that until approx 1 week ago, her CPAP was "working perfectly" -- when she returned home from emergency kidney stone removal.  Pt stated that while in the hosp, she was provided a CPAP machine but was unable to remember what her pressure setting was so they placed her on 10 cwp.  Pt stated that 1- the pressure is not enough at 10 cwp and 2- her machine is currently at [facility] being repaired.  She is wanting her pressure adjusted.  Advised pt that I do not not see where she has ever been seen in the office and that CY will not be able to do this without an ov.  Pt began getting defensive stating that she never followed up after CPAP setup b/c she was not having any problems and is "leary" of seeing physicians.  I advised pt that while I understand her need of CPAP pressure adjustment, [place] law dictates that she be seen once per year in order for [place] to keep her updated on supplies, pressure settings, etc. Pt then stated "well not everybody can afford to come in every year."  Again advised pt that while we understand that there are difficulties at times with keeping a regular follow up, there are rules and regulations that we must abide by.  Pt declined ov and wants this addressed by CY.  I also called [facility] in [place] and spoke with rep Fayrene Fearing.  He stated that pt's records show that she uses a [employer], and this machine is "non-downloadable."  They would be able to do a 2-week titration to establish pressure.    Dr Maple Hudson please advise, thank you.

## 2013-02-04 NOTE — Telephone Encounter (Signed)
Pt has appointment 03/25/2013 with CY as she has seen him in 2007; CY please advise if you would like to see her sooner as her CPAP is broken or send order to [facility] to see if CPAP can be fixed. Thanks.

## 2013-02-04 NOTE — Telephone Encounter (Signed)
Pt advised of Dr Roxy Cedar recommendations.  Pt states that she cannot schedule appt at this time.  Encouraged pt to call our office when able to schedule f/u.

## 2013-02-05 NOTE — Telephone Encounter (Signed)
Per CY-pt can keep 03-25-13 appointment and call to see if anyone cancels their consult with him sooner than this. Thanks.

## 2013-02-05 NOTE — Telephone Encounter (Signed)
Pt to be seen first.

## 2013-02-05 NOTE — Telephone Encounter (Signed)
Joanna [name], Does Dr. Maple Hudson want to send order to see if CPAP can be fixed prior to appt or does pt need to be seen first given it has been > 3 yrs since last OV.

## 2013-02-05 NOTE — Telephone Encounter (Signed)
lmomtcb x1 for pt 

## 2013-02-06 NOTE — Telephone Encounter (Signed)
ATC patient, no answer LMOMTCB 

## 2013-02-06 NOTE — Telephone Encounter (Signed)
Pt returned call.  Spoke with patient and advised her that CY will not be able to order anything regarding her CPAP prior to her appt on 11.4.14.  Pt okay with this, stating that her CPAP is already fixed.  Nothing further needed at this time per pt.  Will sign off.

## 2013-02-09 ENCOUNTER — Other Ambulatory Visit: Payer: Self-pay | Admitting: Family Medicine

## 2013-02-13 ENCOUNTER — Other Ambulatory Visit: Payer: Self-pay | Admitting: Family Medicine

## 2013-02-18 ENCOUNTER — Ambulatory Visit (INDEPENDENT_AMBULATORY_CARE_PROVIDER_SITE_OTHER): Payer: 59 | Admitting: Family Medicine

## 2013-02-18 ENCOUNTER — Encounter: Payer: Self-pay | Admitting: Family Medicine

## 2013-02-18 VITALS — BP 128/72 | HR 71 | Temp 97.9°F | Wt 170.8 lb

## 2013-02-18 DIAGNOSIS — Z23 Encounter for immunization: Secondary | ICD-10-CM

## 2013-02-18 DIAGNOSIS — E0590 Thyrotoxicosis, unspecified without thyrotoxic crisis or storm: Secondary | ICD-10-CM

## 2013-02-18 DIAGNOSIS — E119 Type 2 diabetes mellitus without complications: Secondary | ICD-10-CM

## 2013-02-18 DIAGNOSIS — E785 Hyperlipidemia, unspecified: Secondary | ICD-10-CM

## 2013-02-18 DIAGNOSIS — N200 Calculus of kidney: Secondary | ICD-10-CM

## 2013-02-18 DIAGNOSIS — I10 Essential (primary) hypertension: Secondary | ICD-10-CM

## 2013-02-18 NOTE — Addendum Note (Signed)
Addended by: Criselda Peaches B on: 02/18/2013 09:44 AM   Modules accepted: Orders

## 2013-02-18 NOTE — Addendum Note (Signed)
Addended by: Dianne Dun on: 02/18/2013 08:51 AM   Modules accepted: Orders

## 2013-02-18 NOTE — Progress Notes (Addendum)
62 yo female here for follow up diabetes.  She underwent left PCNL on 01/27/13 for treatment of her renal calculi.  A CT of the abdomen demonstrated no residual left renal calculi. Her pain was controlled and she was felt stable for discharge home.    DM- deteriorated.   Lab Results  Component Value Date   HGBA1C 8.8* 09/26/2012   Metformin increased to 1000 mg twice daily. Admits to not checking sugars regularly but when she does, has been running a lot lower.  Per pt, CBGS were great while she was in the hospital- CBGs reviewed.  No h/o hypoglycemia.  Thyroid dysfunction- thyroid studies normal.  Denies any symptoms of hypo or hyperthyroidism.  Lab Results  Component Value Date   TSH 3.71 09/26/2012   Patient Active Problem List   Diagnosis Date Noted  . Renal calculi 02/18/2013  . Hyperkalemia 02/13/2012  . Anxiety 02/23/2011  . HYPERTENSION 06/28/2007  . DERMATITIS, CONTACT, NOS 10/03/2006  . HYPERTHYROIDISM 09/18/2006  . DIABETES MELLITUS, TYPE II 09/18/2006  . HYPERLIPIDEMIA 09/18/2006  . SLEEP APNEA 09/18/2006  . HYPERGLYCEMIA 09/18/2006   Past Medical History  Diagnosis Date  . Hyperlipidemia   . Foot injury 08    Left foot/leg with ?? torn muscle -PROBLEM HAS RESOLVED  . Hand pain 1/09    steroid injections--RESOLVED  . Adenomatous colon polyp   . Internal hemorrhoids   . IBS (irritable bowel syndrome)   . Hypertension   . Hypothyroidism     pt states hx of thyroid nodules  . Sleep apnea     USES CPAP - SETTING IS 14  . Diabetes mellitus     type II--ORAL MEDICATION - NO INSULIN  . Hx of renal calculi     LEFT   Past Surgical History  Procedure Laterality Date  . Colonoscopy w/ biopsies and polypectomy  05/19/2005    adenomatous polyps  . Tubal ligation    . Wisdom teeth extracted    . Nephrolithotomy Left 01/27/2013    Procedure: NEPHROLITHOTOMY PERCUTANEOUS;  Surgeon: Crecencio Mc, MD;  Location: [facility];  Service: Urology;  Laterality: Left;   History   Substance Use Topics  . Smoking status: Never Smoker   . Smokeless tobacco: Never Used  . Alcohol Use: No   No family history on file. Allergies  Allergen Reactions  . Atorvastatin     REACTION: Hot flashes and flu-like symptoms  . Niacin     REACTION: n/v  . Pravastatin Sodium     REACTION: Neck swelling and pain in her shoulders and arms.  . Rosuvastatin     REACTION: N/V and heartburn  . Simvastatin     REACTION: GI  . Sulfonamide Derivatives     Rxn Unknown   Current Outpatient Prescriptions on File Prior to Visit  Medication Sig Dispense Refill  . acidophilus (RISAQUAD) CAPS capsule Take 1 capsule by mouth daily.      Marland Kitchen docusate sodium (COLACE) 100 MG capsule Take 1 capsule (100 mg total) by mouth 2 (two) times daily.  30 capsule  0  . levothyroxine (SYNTHROID, LEVOTHROID) 112 MCG tablet Take 112 mcg by mouth daily before breakfast.      . levothyroxine (SYNTHROID, LEVOTHROID) 112 MCG tablet Take 1 tablet daily. **NEEDS APPOINTMENT FOR FURTHER REFILLS**  30 tablet  0  . metFORMIN (GLUCOPHAGE) 1000 MG tablet Take 1,000 mg by mouth 2 (two) times daily with a meal.      . oxyCODONE-acetaminophen (ROXICET) 5-325 MG per  tablet Take 1 tablet by mouth every 4 (four) hours as needed for pain.  30 tablet  0  . ramipril (ALTACE) 2.5 MG capsule Take 2.5 mg by mouth every morning.      . ramipril (ALTACE) 2.5 MG capsule TAKE ONE CAPSULE BY MOUTH EVERY DAY  30 capsule  6   No current facility-administered medications on file prior to visit.     The PMH, PSH, Social History, Family History, Medications, and allergies have been reviewed in [facility], and have been updated if relevant.   Review of Systems       See HPI   Physical Exam BP 128/72  Pulse 71  Temp(Src) 97.9 F (36.6 C) (Oral)  Wt 170 lb 12 oz (77.452 kg)  BMI 29.29 kg/m2  SpO2 98%  General:  Well-developed,well-nourished,in no acute distress; alert,appropriate and cooperative throughout examination Head:   normocephalic and atraumatic.   Eyes:  vision grossly intact, pupils equal, pupils round, and pupils reactive to light.   Ears:  R ear normal and L ear normal.   Lungs:  Normal respiratory effort, chest expands symmetrically. Lungs are clear to auscultation, no crackles or wheezes. Heart:  Normal rate and regular rhythm. S1 and S2 normal without gallop, murmur, click, rub or other extra sounds. Abdomen:  Bowel sounds positive,abdomen soft and non-tender without masses, organomegaly or hernias noted. Msk:  No deformity or scoliosis noted of thoracic or lumbar spine.   Extremities:  No clubbing, cyanosis, edema, or deformity noted with normal full range of motion of all joints.   Neurologic:  alert & oriented X3 and gait normal.   Skin:  Intact without suspicious lesions or rashes Psych:  Cognition and judgment appear intact. Alert and cooperative with normal attention span and concentration. No apparent delusions, illusions, hallucinations  Assessment and Plan:  1. Renal calculi Doing well. Follow up with Dr. Laverle Patter next week.  2. HYPERTHYROIDISM Recheck TSH today.  3. HYPERTENSION Stable.  4. DIABETES MELLITUS, TYPE II Recheck a1c. On ACEI- does not need urine microalbumin.

## 2013-03-05 ENCOUNTER — Other Ambulatory Visit: Payer: Self-pay | Admitting: Family Medicine

## 2013-03-25 ENCOUNTER — Encounter: Payer: Self-pay | Admitting: Internal Medicine

## 2013-03-25 ENCOUNTER — Ambulatory Visit (INDEPENDENT_AMBULATORY_CARE_PROVIDER_SITE_OTHER): Payer: 59 | Admitting: Internal Medicine

## 2013-03-25 VITALS — BP 110/60 | HR 68 | Ht 63.5 in | Wt 169.0 lb

## 2013-03-25 DIAGNOSIS — G4730 Sleep apnea, unspecified: Secondary | ICD-10-CM

## 2013-03-25 NOTE — Progress Notes (Signed)
11//4/14- 62 yoF never smoker -Former patient-needs to have pressure adjusted on CPAP; sleep study attached NPSG 08/31/05- Severe OSA, AHI 61.5/ hr, weight 180 lbs. Titrated to CPAP 19/ [facility] which she has used every night. She has lost weight and now pressure too high. Dislije new nasal mask from [facility] so got one she preferred on-line.  Bedtime 9-10 PM, short latency, several wakings before up 6AM.  No ENT surgery, Never smoked  Prior to Admission medications   Medication Sig Start Date End Date Taking? Authorizing Provider  acidophilus (RISAQUAD) CAPS capsule Take 1 capsule by mouth daily.   Yes Historical Provider, MD  levothyroxine (SYNTHROID, LEVOTHROID) 112 MCG tablet Take 1 tablet (112 mcg total) by mouth daily. 03/05/13  Yes Dianne Dun, MD  metFORMIN (GLUCOPHAGE) 1000 MG tablet Take 1,000 mg by mouth 2 (two) times daily with a meal.   Yes Historical Provider, MD  ramipril (ALTACE) 2.5 MG capsule TAKE ONE CAPSULE BY MOUTH EVERY DAY 02/09/13  Yes Dianne Dun, MD   Past Medical History  Diagnosis Date  . Hyperlipidemia   . Foot injury 08    Left foot/leg with ?? torn muscle -PROBLEM HAS RESOLVED  . Hand pain 1/09    steroid injections--RESOLVED  . Adenomatous colon polyp   . Internal hemorrhoids   . IBS (irritable bowel syndrome)   . Hypertension   . Hypothyroidism     pt states hx of thyroid nodules  . Sleep apnea     USES CPAP - SETTING IS 14  . Diabetes mellitus     type II--ORAL MEDICATION - NO INSULIN  . Hx of renal calculi     LEFT   Past Surgical History  Procedure Laterality Date  . Colonoscopy w/ biopsies and polypectomy  05/19/2005    adenomatous polyps  . Tubal ligation    . Wisdom teeth extracted    . Nephrolithotomy Left 01/27/2013    Procedure: NEPHROLITHOTOMY PERCUTANEOUS;  Surgeon: Crecencio Mc, MD;  Location: [facility];  Service: Urology;  Laterality: Left;   History reviewed. No pertinent family history. History   Social History  . Marital Status:  Married    Spouse Name: N/A    Number of Children: N/A  . 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 Smoking status: Never Smoker   . Smokeless tobacco: Never Used  . Alcohol Use: No  . Drug Use: No  . Sexual Activity: Not on file   Other Topics Concern  . Not on file   Social History Narrative  . No narrative on file   ROS-see HPI Constitutional:   +weight loss, night sweats, fevers, chills, +fatigue, lassitude. HEENT:   No-  headaches, difficulty swallowing, tooth/dental problems, sore throat,       No-  sneezing, itching, ear ache, nasal congestion, post nasal drip,  CV:  No-   chest pain, orthopnea, PND, swelling in lower extremities, anasarca,  dizziness, palpitations Resp: No-   shortness of breath with exertion or at rest.              No-   productive cough,  No non-productive cough,  No- coughing up of blood.              No-   change in color of mucus.  No- wheezing.   Skin: No-   rash or lesions. GI:  No-  heartburn, indigestion, abdominal pain, nausea, vomiting, diarrhea,                 change in bowel habits, loss of appetite GU: No-   dysuria, change in color of urine, no urgency or frequency.  No- flank pain. MS:  No-   joint pain or swelling.  No- decreased range of motion.  No- back pain. Neuro-     nothing unusual Psych:  No- change in mood or affect. No depression or anxiety.  No memory loss.  OBJ- Physical Exam General- Alert, Oriented, Affect-appropriate, Distress- none acute Skin- rash-none, lesions- none, excoriation- none Lymphadenopathy- none Head- atraumatic            Eyes- Gross vision intact, PERRLA, conjunctivae and secretions clear            Ears- Hearing, canals-normal            Nose- Clear, no-Septal dev, mucus, polyps, erosion, perforation             Throat- Mallampati III , mucosa clear , drainage- none, tonsils- atrophic Neck- flexible , trachea midline, no  stridor , thyroid nl, carotid no bruit Chest - symmetrical excursion , unlabored           Heart/CV- RRR , no murmur , no gallop  , no rub, nl s1 s2                           - JVD- none , edema- none, stasis changes- none, varices- none           Lung- clear to P&A, wheeze- none, cough- none , dullness-none, rub- none           Chest wall-  Abd- tender-no, distended-no, bowel sounds-present, HSM- no Br/ Gen/ Rectal- Not done, not indicated Extrem- cyanosis- none, clubbing, none, atrophy- none, strength- nl Neuro- grossly intact to observation

## 2013-03-25 NOTE — Patient Instructions (Signed)
Order- DME [facility] do autotitration 5- 20 cwp x 7 days for pressure recommendation   Dx OSA

## 2013-03-25 NOTE — Assessment & Plan Note (Signed)
Significant weight loss since initial titraation, with good compliance. Presure needs readjusting. Plan- autotitrate for pressure

## 2013-04-01 ENCOUNTER — Other Ambulatory Visit: Payer: Self-pay | Admitting: Dermatology

## 2013-04-02 ENCOUNTER — Other Ambulatory Visit: Payer: Self-pay | Admitting: Family Medicine

## 2013-04-22 ENCOUNTER — Emergency Department (HOSPITAL_COMMUNITY)
Admission: EM | Admit: 2013-04-22 | Discharge: 2013-04-22 | Disposition: A | Discharge status: Home / Self Care | Payer: 59 | Attending: Emergency Medicine | Admitting: Emergency Medicine

## 2013-04-22 ENCOUNTER — Encounter (HOSPITAL_COMMUNITY): Payer: Self-pay | Admitting: Emergency Medicine

## 2013-04-22 ENCOUNTER — Emergency Department (HOSPITAL_COMMUNITY): Payer: 59

## 2013-04-22 DIAGNOSIS — K589 Irritable bowel syndrome without diarrhea: Secondary | ICD-10-CM | POA: Insufficient documentation

## 2013-04-22 DIAGNOSIS — Z888 Allergy status to other drugs, medicaments and biological substances status: Secondary | ICD-10-CM | POA: Insufficient documentation

## 2013-04-22 DIAGNOSIS — K921 Melena: Secondary | ICD-10-CM | POA: Insufficient documentation

## 2013-04-22 DIAGNOSIS — I10 Essential (primary) hypertension: Secondary | ICD-10-CM | POA: Insufficient documentation

## 2013-04-22 DIAGNOSIS — Z882 Allergy status to sulfonamides status: Secondary | ICD-10-CM | POA: Insufficient documentation

## 2013-04-22 DIAGNOSIS — Z860100 Personal history of colon polyps, unspecified: Secondary | ICD-10-CM | POA: Insufficient documentation

## 2013-04-22 DIAGNOSIS — E039 Hypothyroidism, unspecified: Secondary | ICD-10-CM | POA: Insufficient documentation

## 2013-04-22 DIAGNOSIS — E785 Hyperlipidemia, unspecified: Secondary | ICD-10-CM | POA: Insufficient documentation

## 2013-04-22 DIAGNOSIS — K5792 Diverticulitis of intestine, part unspecified, without perforation or abscess without bleeding: Secondary | ICD-10-CM

## 2013-04-22 DIAGNOSIS — Z79899 Other long term (current) drug therapy: Secondary | ICD-10-CM | POA: Insufficient documentation

## 2013-04-22 DIAGNOSIS — E119 Type 2 diabetes mellitus without complications: Secondary | ICD-10-CM | POA: Insufficient documentation

## 2013-04-22 DIAGNOSIS — K5732 Diverticulitis of large intestine without perforation or abscess without bleeding: Secondary | ICD-10-CM | POA: Insufficient documentation

## 2013-04-22 DIAGNOSIS — Z87442 Personal history of urinary calculi: Secondary | ICD-10-CM | POA: Insufficient documentation

## 2013-04-22 DIAGNOSIS — G4730 Sleep apnea, unspecified: Secondary | ICD-10-CM | POA: Insufficient documentation

## 2013-04-22 DIAGNOSIS — Z8719 Personal history of other diseases of the digestive system: Secondary | ICD-10-CM | POA: Insufficient documentation

## 2013-04-22 LAB — URINALYSIS, ROUTINE W REFLEX MICROSCOPIC
Ketones, ur: NEGATIVE mg/dL
Leukocytes, UA: NEGATIVE
Nitrite: NEGATIVE
Protein, ur: NEGATIVE mg/dL
Urobilinogen, UA: 0.2 mg/dL (ref 0.0–1.0)

## 2013-04-22 LAB — CBC WITH DIFFERENTIAL/PLATELET
Basophils Absolute: 0.1 10*3/uL (ref 0.0–0.1)
Eosinophils Relative: 3 % (ref 0–5)
HCT: 36.8 % (ref 36.0–46.0)
Hemoglobin: 12.1 g/dL (ref 12.0–15.0)
Lymphocytes Relative: 23 % (ref 12–46)
MCHC: 32.9 g/dL (ref 30.0–36.0)
MCV: 78.5 fL (ref 78.0–100.0)
Monocytes Absolute: 0.5 10*3/uL (ref 0.1–1.0)
Monocytes Relative: 6 % (ref 3–12)
Neutro Abs: 4.8 10*3/uL (ref 1.7–7.7)
RDW: 15.4 % (ref 11.5–15.5)
WBC: 7.1 10*3/uL (ref 4.0–10.5)

## 2013-04-22 LAB — COMPREHENSIVE METABOLIC PANEL
AST: 19 U/L (ref 0–37)
BUN: 10 mg/dL (ref 6–23)
CO2: 28 mEq/L (ref 19–32)
Calcium: 9.6 mg/dL (ref 8.4–10.5)
Chloride: 96 mEq/L (ref 96–112)
Creatinine, Ser: 0.65 mg/dL (ref 0.50–1.10)
GFR calc Af Amer: >90 mL/min (ref >90)
GFR calc non Af Amer: >90 mL/min (ref >90)
Glucose, Bld: 121 mg/dL — ABNORMAL HIGH (ref 70–99)
Total Bilirubin: 0.3 mg/dL (ref 0.3–1.2)

## 2013-04-22 LAB — LIPASE, BLOOD: Lipase: 33 U/L (ref 11–59)

## 2013-04-22 MED ORDER — CIPROFLOXACIN HCL 500 MG PO TABS
500.0000 mg | ORAL_TABLET | Freq: Once | ORAL | Status: AC
  Administered 2013-04-22: 500 mg via ORAL
  Filled 2013-04-22: qty 1

## 2013-04-22 MED ORDER — OXYCODONE-ACETAMINOPHEN 5-325 MG PO TABS: 1.0000 | ORAL_TABLET | ORAL | Status: DC | PRN

## 2013-04-22 MED ORDER — IOHEXOL 300 MG/ML  SOLN
100.0000 mL | Freq: Once | INTRAMUSCULAR | Status: AC | PRN
  Administered 2013-04-22: 100 mL via INTRAVENOUS

## 2013-04-22 MED ORDER — HYDROMORPHONE HCL PF 1 MG/ML IJ SOLN
1.0000 mg | Freq: Once | INTRAMUSCULAR | Status: AC
  Administered 2013-04-22: 1 mg via INTRAVENOUS
  Filled 2013-04-22: qty 1

## 2013-04-22 MED ORDER — CIPROFLOXACIN HCL 500 MG PO TABS: 500.0000 mg | ORAL_TABLET | Freq: Two times a day (BID) | ORAL | Status: DC

## 2013-04-22 MED ORDER — ONDANSETRON HCL 4 MG/2ML IJ SOLN
4.0000 mg | Freq: Once | INTRAMUSCULAR | Status: AC
  Administered 2013-04-22: 4 mg via INTRAVENOUS
  Filled 2013-04-22: qty 2

## 2013-04-22 MED ORDER — METRONIDAZOLE 500 MG PO TABS: 500.0000 mg | ORAL_TABLET | Freq: Three times a day (TID) | ORAL | Status: DC

## 2013-04-22 MED ORDER — METRONIDAZOLE 500 MG PO TABS
500.0000 mg | ORAL_TABLET | Freq: Once | ORAL | Status: AC
  Administered 2013-04-22: 500 mg via ORAL
  Filled 2013-04-22: qty 1

## 2013-04-22 MED ORDER — IOHEXOL 300 MG/ML  SOLN
25.0000 mL | INTRAMUSCULAR | Status: DC | PRN
  Administered 2013-04-22: 25 mL via ORAL

## 2013-04-22 MED ORDER — SODIUM CHLORIDE 0.9 % IV BOLUS (SEPSIS)
1000.0000 mL | Freq: Once | INTRAVENOUS | Status: AC
  Administered 2013-04-22: 1000 mL via INTRAVENOUS

## 2013-04-22 NOTE — ED Notes (Signed)
PT ambulated with baseline gait; VSS; A&Ox3; no signs of distress; respirations even and unlabored; skin warm and dry; no questions upon discharge.  

## 2013-04-22 NOTE — ED Provider Notes (Signed)
I saw and evaluated the patient, reviewed the resident's note and I agree with the findings and plan.  EKG Interpretation   None       62 year old female with left lower quadrant abdominal pain for several days. Found to have diverticulitis on CT scan. On exam, well-appearing, abdomen soft but tender in left lower quadrant, no rigidity rebound or guarding. She appears stable for outpatient treatment, and has ECP followup in 2 days for recheck. Return precautions given.  Clinical Impression: 1. Diverticulitis       Candyce Churn, MD 04/22/13 1911

## 2013-04-22 NOTE — ED Notes (Signed)
Pt in c/o LLQ abd pain over the last few days, increased pain today and was unable to see her MD, history of kidney stones, also nausea, denies vomiting

## 2013-04-22 NOTE — ED Provider Notes (Signed)
CSN: [id number]     Arrival date & time 04/22/13  1344 History   First MD Initiated Contact with Patient 04/22/13 1511     Chief Complaint  Patient presents with  . Abdominal Pain   (Consider location/radiation/quality/duration/timing/severity/associated sxs/prior Treatment) Patient is a 62 y.o. female presenting with abdominal pain.  Abdominal Pain Associated symptoms: no chest pain, no chills, no cough, no diarrhea, no dysuria, no fever, no nausea, no shortness of breath and no vomiting    Joanna [name] is a 62 y.o. female who presents to the ED with three days of LLQ pain.  Insideous onset.  Sharp.  Worse with palpation.  Better with nothing.  Initially 2/10.  Now in 9-10/10 range.  Normal bowel movements.  No N/V.  History of recent kidney stone and lithotripsy but she is not sure if this is the same pain.  No vaginal bleeding.  Had one episode of blood in stool 2 weeks ago but none since.  No fevers.  No other symptoms.  Past Medical History  Diagnosis Date  . Hyperlipidemia   . Foot injury 08    Left foot/leg with ?? torn muscle -PROBLEM HAS RESOLVED  . Hand pain 1/09    steroid injections--RESOLVED  . Adenomatous colon polyp   . Internal hemorrhoids   . IBS (irritable bowel syndrome)   . Hypertension   . Hypothyroidism     pt states hx of thyroid nodules  . Sleep apnea     USES CPAP - SETTING IS 14  . Diabetes mellitus     type II--ORAL MEDICATION - NO INSULIN  . Hx of renal calculi     LEFT   Past Surgical History  Procedure Laterality Date  . Colonoscopy w/ biopsies and polypectomy  05/19/2005    adenomatous polyps  . Tubal ligation    . Wisdom teeth extracted    . Nephrolithotomy Left 01/27/2013    Procedure: NEPHROLITHOTOMY PERCUTANEOUS;  Surgeon: Crecencio Mc, MD;  Location: [facility];  Service: Urology;  Laterality: Left;   History reviewed. No pertinent family history. History  Substance Use Topics  . Smoking status: Never Smoker   . Smokeless tobacco: Never Used   . Alcohol Use: No   OB History   Grav Para Term Preterm Abortions TAB SAB Ect Mult Living                 Review of Systems  Constitutional: Negative for fever and chills.  HENT: Negative for congestion and rhinorrhea.   Respiratory: Negative for cough and shortness of breath.   Cardiovascular: Negative for chest pain.  Gastrointestinal: Positive for abdominal pain. Negative for nausea, vomiting, diarrhea and abdominal distention.  Endocrine: Negative for polyuria.  Genitourinary: Negative for dysuria.  Musculoskeletal: Negative for neck pain and neck stiffness.  Skin: Negative for rash.  Neurological: Negative for headaches.  Psychiatric/Behavioral: Negative.     Allergies  Atorvastatin; Niacin; Pravastatin sodium; Rosuvastatin; Simvastatin; and Sulfonamide derivatives  Home Medications   Current Outpatient Rx  Name  Route  Sig  Dispense  Refill  . acidophilus (RISAQUAD) CAPS capsule   Oral   Take 1 capsule by mouth daily.         Marland Kitchen levothyroxine (SYNTHROID, LEVOTHROID) 112 MCG tablet   Oral   Take 1 tablet (112 mcg total) by mouth daily.   30 tablet   5   . metFORMIN (GLUCOPHAGE) 1000 MG tablet   Oral   Take 1,000 mg by mouth 2 (two) times  daily with a meal.         . metFORMIN (GLUCOPHAGE) 1000 MG tablet      TAKE 1 TABLET TWICE A DAY   60 tablet   5   . ramipril (ALTACE) 2.5 MG capsule      TAKE ONE CAPSULE BY MOUTH EVERY DAY   30 capsule   6    BP 155/90  Pulse 88  Temp(Src) 98 F (36.7 C) (Oral)  Resp 10  Wt 172 lb 12.8 oz (78.382 kg)  SpO2 100% Physical Exam  Nursing note and vitals reviewed. Constitutional: She is oriented to person, place, and time. She appears well-developed and well-nourished. No distress.  HENT:  Head: Normocephalic and atraumatic.  Right Ear: External ear normal.  Left Ear: External ear normal.  Nose: Nose normal.  Mouth/Throat: Oropharynx is clear and moist. No oropharyngeal exudate.  Eyes: EOM are normal.  Pupils are equal, round, and reactive to light.  Neck: Normal range of motion. Neck supple. No tracheal deviation present.  Cardiovascular: Normal rate.   Pulmonary/Chest: Effort normal and breath sounds normal. No stridor. No respiratory distress. She has no wheezes. She has no rales.  Abdominal: Soft. She exhibits no distension. There is tenderness in the left lower quadrant. There is guarding. There is no rigidity, no rebound, no tenderness at McBurney's point and negative Murphy's sign.  Musculoskeletal: Normal range of motion.  Neurological: She is alert and oriented to person, place, and time.  Skin: Skin is warm and dry. She is not diaphoretic.    ED Course  Procedures (including critical care time) Labs Review Labs Reviewed  CBC WITH DIFFERENTIAL - Abnormal; Notable for the following:    MCH 25.8 (*)    All other components within normal limits  COMPREHENSIVE METABOLIC PANEL - Abnormal; Notable for the following:    Glucose, Bld 121 (*)    All other components within normal limits  URINALYSIS, ROUTINE W REFLEX MICROSCOPIC - Abnormal; Notable for the following:    Specific Gravity, Urine 1.003 (*)    All other components within normal limits  LIPASE, BLOOD   Imaging Review No results found.  EKG Interpretation   None       MDM   1. Diverticulitis     Joanna [name] is a 62 y.o. female with history of kidney stones and recent L sided lithotripsy done on 9/8 of this year who presents with LLQ pain.  Exam with normal vitals and guarding in LLQ.  No significant lab abnormalities.  CT scan performed and with diverticulitis.  This is c/w exam and history.  Will treat with cipro flagyl.  Patient already has PCP f/u and GI f/u scheduled.  Return precautions discussed.  Patient safe for discharge.  Patient discharged.  Arloa Koh, MD 04/22/13 1904

## 2013-04-22 NOTE — ED Notes (Signed)
MD at bedside. 

## 2013-04-22 NOTE — ED Notes (Signed)
Ct notified pt done with contrast

## 2013-04-23 ENCOUNTER — Other Ambulatory Visit: Payer: Self-pay | Admitting: Family Medicine

## 2013-04-23 ENCOUNTER — Encounter: Payer: Self-pay | Admitting: Gastroenterology

## 2013-04-23 NOTE — ED Provider Notes (Signed)
I saw and evaluated the patient, reviewed the resident's note and I agree with the findings and plan.   Candyce Churn, MD 04/23/13 1311

## 2013-04-24 ENCOUNTER — Ambulatory Visit: Payer: 59 | Admitting: Family Medicine

## 2013-04-30 ENCOUNTER — Ambulatory Visit (INDEPENDENT_AMBULATORY_CARE_PROVIDER_SITE_OTHER): Payer: 59 | Admitting: Internal Medicine

## 2013-04-30 ENCOUNTER — Encounter: Payer: Self-pay | Admitting: Internal Medicine

## 2013-04-30 VITALS — BP 120/82 | HR 100 | Ht 63.0 in | Wt 169.1 lb

## 2013-04-30 DIAGNOSIS — Z86010 Personal history of colonic polyps: Secondary | ICD-10-CM

## 2013-04-30 DIAGNOSIS — K5900 Constipation, unspecified: Secondary | ICD-10-CM

## 2013-04-30 DIAGNOSIS — L259 Unspecified contact dermatitis, unspecified cause: Secondary | ICD-10-CM

## 2013-04-30 DIAGNOSIS — K5909 Other constipation: Secondary | ICD-10-CM

## 2013-04-30 DIAGNOSIS — Z9283 Personal history of failed moderate sedation: Secondary | ICD-10-CM

## 2013-04-30 DIAGNOSIS — K648 Other hemorrhoids: Secondary | ICD-10-CM

## 2013-04-30 DIAGNOSIS — K5732 Diverticulitis of large intestine without perforation or abscess without bleeding: Secondary | ICD-10-CM

## 2013-04-30 DIAGNOSIS — L309 Dermatitis, unspecified: Secondary | ICD-10-CM

## 2013-04-30 MED ORDER — NA SULFATE-K SULFATE-MG SULF 17.5-3.13-1.6 GM/177ML PO SOLN: ORAL | Status: DC

## 2013-04-30 MED ORDER — POLYETHYLENE GLYCOL 3350 17 GM/SCOOP PO POWD: 1.0000 | Freq: Every day | ORAL | Status: DC

## 2013-04-30 NOTE — Assessment & Plan Note (Signed)
RA most prominent on anoscopy - hx consistent also - consider ligation after colonoscopy evaluation

## 2013-04-30 NOTE — Assessment & Plan Note (Signed)
Overdue for surveillance. Colonoscopy scheduled given this and bleeding. The risks and benefits as well as alternatives of endoscopic procedure(s) have been discussed and reviewed. All questions answered. The patient agrees to proceed.

## 2013-04-30 NOTE — Progress Notes (Signed)
Subjective:    Patient ID: Joanna [name], female    DOB: 1951-02-20, 62 y.o.   MRN: [id number]  HPI The patient is here because of several problems. I have known her because of prior adenomatous polyps removed in 2006, she has not yet had a followup colonoscopy. In September of this year she developed obstructing left kidney stone that required urologic surgical intervention. Prior to that she had increased fiber in her diet and her bowel movements, eating a lot of foods that apparently were high in oxalate because she has been told to stop this. Once she got over that she developed acute left lower quadrant pain early this month went to the emergency department where descending and sigmoid diverticulitis was diagnosed by CT scan. She has been on ciprofloxacin for about 8 days now and that pain seems resolved. She is also on metronidazole. Due to finish in 2 days. She has not moved her bowels since the acute attacks that she had diarrhea prior to that. She doesn't feel like she has to defecate but is concerned by this. She is not having fever at this point. In the days prior to the diagnosis she had a lot of rectal bleeding and blood in the stool. She has chronic intermittent problems with wiping and itching, and has bulging hemorrhoids that spontaneously reduced at times. She has chronic constipation and strains quite a bit to defecate. She has tried MiraLax which seems to help, she is also uses Benefiber in the past. A number of years ago she was recommended for a rectocele repair decided not to go through with it but believes she has not really suffered for not doing that. She has some urinary urgency at this time but no urinary incontinence history.  Has been very busy lately, one daughter was married, she cares for her elderly disabled parents one of whom is demented. She is excited about the arrival of a grandchild in January 2015. Allergies  Allergen Reactions  . Crestor [Rosuvastatin]    REACTION: N/V and heartburn  . Lipitor [Atorvastatin]     REACTION: Hot flashes and flu-like symptoms  . Niacin     REACTION: n/v  . Pravachol [Pravastatin Sodium]   . Pravastatin Sodium     REACTION: Neck swelling and pain in her shoulders and arms.  . Sulfonamide Derivatives     Rxn Unknown  . Zocor [Simvastatin]     REACTION: GI   Outpatient Prescriptions Prior to Visit  Medication Sig Dispense Refill  . ciprofloxacin (CIPRO) 500 MG tablet Take 1 tablet (500 mg total) by mouth 2 (two) times daily. One po bid x 10 days  20 tablet  0  . levothyroxine (SYNTHROID, LEVOTHROID) 112 MCG tablet Take 1 tablet (112 mcg total) by mouth daily.  30 tablet  5  . metFORMIN (GLUCOPHAGE) 1000 MG tablet TAKE 1 TABLET TWICE A DAY  60 tablet  5  . metroNIDAZOLE (FLAGYL) 500 MG tablet Take 1 tablet (500 mg total) by mouth 3 (three) times daily. For 10 days.  30 tablet  0  . oxyCODONE-acetaminophen (PERCOCET/ROXICET) 5-325 MG per tablet Take 1-2 tablets by mouth every 4 (four) hours as needed for severe pain.  15 tablet  0  . Probiotic Product (PROBIOTIC DAILY PO) Take 1 capsule by mouth daily.      . ramipril (ALTACE) 2.5 MG capsule TAKE ONE CAPSULE BY MOUTH EVERY DAY  30 capsule  6   No facility-administered medications prior to visit.  Past Medical History  Diagnosis Date  . Hyperlipidemia   . Foot injury 08    Left foot/leg with ?? torn muscle -PROBLEM HAS RESOLVED  . Hand pain 1/09    steroid injections--RESOLVED  . Adenomatous colon polyp   . Internal hemorrhoids   . IBS (irritable bowel syndrome)   . Hypertension   . Hypothyroidism     pt states hx of thyroid nodules  . Sleep apnea     USES CPAP - SETTING IS 14  . Diabetes mellitus     type II--ORAL MEDICATION - NO INSULIN  . Hx of renal calculi     LEFT  . Diverticulitis of colon    Past Surgical History  Procedure Laterality Date  . Colonoscopy w/ biopsies and polypectomy  05/19/2005    adenomatous polyps  . Tubal ligation      . Wisdom teeth extracted    . Nephrolithotomy Left 01/27/2013    Procedure: NEPHROLITHOTOMY PERCUTANEOUS;  Surgeon: Crecencio Mc, MD;  Location: [facility];  Service: Urology;  Laterality: Left;   History   Social History  . Marital Status: Married    Spouse Name: N/A    Number of Children: 3  . 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 Smoking status: Never Smoker   . Smokeless tobacco: Never Used  . Alcohol Use: No  . Drug Use: No  .      Social History Narrative   Married, 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 One caffeinated drink daily.   Updated as of 04/30/2013   Family History  Problem Relation Age of Onset  . Hyperlipidemia Mother    Review of Systems Nasal congestion recently, all other review of systems negative or as per history of present illness.    Objective:   Physical Exam General:  Well-developed, well-nourished and in no acute distress Eyes:  anicteric. ENT:   Mouth and posterior pharynx free of lesions.  Neck:   supple w/o thyromegaly or mass.  Lungs: Clear to auscultation bilaterally. Heart:  S1S2, no rubs, murmurs, gallops. Abdomen:  soft, non-tender, no hepatosplenomegaly, hernia, or mass and BS+.   Rectal: Female staff present Anoderm inspection revealed perianal hyperpigmentation with thickened skin Anal wink was absent Digital exam revealed slight decrease in resting tone and voluntary squeeze. Moderate rectocele present. Simulated defecation with valsalva revealed appropriate abdominal contraction and ? Of slightly excessive descent.    Lymph:  no cervical or supraclavicular adenopathy. Extremities:   no edema Skin   no rash. Neuro:  A&O x 3.  Psych:  appropriate mood and  Affect.   Anoscopy was performed with the patient in the left lateral decubitus position while a chaperone was present and revealed small inflamed internal hemorrhoids most prominent RIGHT  ANTERIOR  Data Reviewed: CT scan and images from recent ED visit 04/22/2013   EXAM: CT ABDOMEN AND PELVIS WITH CONTRAST   TECHNIQUE: Multidetector CT imaging of the abdomen and pelvis was performed using the standard protocol following bolus administration of intravenous contrast.   CONTRAST:  OMNIPAQUE IOHEXOL 300 MG/ML  SOLN   COMPARISON:  January 28, 2013   FINDINGS: There is mild diffuse fatty infiltration of liver. The liver is otherwise normal. The spleen, pancreas, gallbladder, adrenal glands are normal. There is mild scarring of the lower pole left kidney. There is a small cyst in the mid  to lower pole of right kidney. There is no hydronephrosis bilaterally. There is atherosclerosis of the abdominal aorta without aneurysmal dilatation. There is no abdominal lymphadenopathy.   There is no small bowel obstruction. There is stranding surrounding the descending colon/sigmoid colon junction. The appendix is normal.   Fluid-filled bladder is normal. The uterus is normal. There is mild dependent atelectasis of the posterior lung bases. Degenerative joint changes of the lower lumbar spine are noted. No acute abnormality is identified in the bones.   IMPRESSION: Distal descending/sigmoid colon junction diverticulitis. There is no free air. Mild fatty infiltration of liver.  Lab Results  Component Value Date   WBC 7.1 04/22/2013   HGB 12.1 04/22/2013   HCT 36.8 04/22/2013   MCV 78.5 04/22/2013   PLT 283 04/22/2013       Assessment & Plan:   1. Diverticulitis of colon (without mention of hemorrhage)   2. Hemorrhoids, internal, with bleeding and grade 2 prolapse   3. Personal history of colonic adenomas   4. Perianal dermatitis   5. Chronic constipation   6. Personal history of failed moderate sedation    CC: Ruthe Mannan, MD

## 2013-04-30 NOTE — Assessment & Plan Note (Signed)
Start MiraLax daily and follow-up after colonoscopy. This and straining is part of anorectal problems. Does not have urinary incontinence but ? Some pelvic floor problems.

## 2013-04-30 NOTE — Assessment & Plan Note (Signed)
Seems resolved after Abx - finishes in 2 days

## 2013-04-30 NOTE — Patient Instructions (Addendum)
You have been scheduled for a colonoscopy with propofol. Please follow written instructions given to you at your visit today.  Please pick up your prep kit at the pharmacy within the next 1-3 days. If you use inhalers (even only as needed), please bring them with you on the day of your procedure. Your physician has requested that you go to www.startemmi.com and enter the access code given to you at your visit today. This web site gives a general overview about your procedure. However, you should still follow specific instructions given to you by our office regarding your preparation for the procedure.  Please take Miralax daily and also try Balneol lotion (handout given).  These are both over the counter products.  I appreciate the opportunity to care for you.

## 2013-04-30 NOTE — Assessment & Plan Note (Signed)
Balneol for now

## 2013-05-05 ENCOUNTER — Encounter: Payer: Self-pay | Admitting: Internal Medicine

## 2013-05-05 ENCOUNTER — Ambulatory Visit (AMBULATORY_SURGERY_CENTER): Payer: 59 | Admitting: Internal Medicine

## 2013-05-05 VITALS — BP 127/68 | HR 69 | Temp 98.0°F | Resp 18 | Ht 63.0 in | Wt 169.0 lb

## 2013-05-05 DIAGNOSIS — K625 Hemorrhage of anus and rectum: Secondary | ICD-10-CM

## 2013-05-05 DIAGNOSIS — D126 Benign neoplasm of colon, unspecified: Secondary | ICD-10-CM

## 2013-05-05 DIAGNOSIS — Z86010 Personal history of colonic polyps: Secondary | ICD-10-CM

## 2013-05-05 DIAGNOSIS — K5730 Diverticulosis of large intestine without perforation or abscess without bleeding: Secondary | ICD-10-CM

## 2013-05-05 LAB — GLUCOSE, CAPILLARY
Glucose-Capillary: 116 mg/dL — ABNORMAL HIGH (ref 70–99)
Glucose-Capillary: 100 mg/dL — ABNORMAL HIGH (ref 70–99)

## 2013-05-05 MED ORDER — SODIUM CHLORIDE 0.9 % IV SOLN: 500.0000 mL | INTRAVENOUS | Status: DC

## 2013-05-05 NOTE — Progress Notes (Signed)
Patient did not experience any of the following events: a burn prior to discharge; a fall within the facility; wrong site/side/patient/procedure/implant event; or a hospital transfer or hospital admission upon discharge from the facility. (G8907) Patient did not have preoperative order for IV antibiotic SSI prophylaxis. (G8918)  

## 2013-05-05 NOTE — Op Note (Signed)
[facility] [address] [place], [address]   COLONOSCOPY PROCEDURE REPORT  PATIENT: Joanna [name], Joanna [name]  MR#: [id number] BIRTHDATE: [birth year] , 62  yrs. old GENDER: Female ENDOSCOPIST: Iva Boop, MD, [facility] PROCEDURE DATE:  05/05/2013 PROCEDURE:   Colonoscopy with biopsy and snare polypectomy First Screening Colonoscopy - Avg.  risk and is 50 yrs.  old or older - No.  Prior Negative Screening - Now for repeat screening. N/A  History of Adenoma - Now for follow-up colonoscopy & has been > or = to 3 yrs.  Yes hx of adenoma.  Has been 3 or more years since last colonoscopy.  Polyps Removed Today? Yes. ASA CLASS:   Class III INDICATIONS:Rectal Bleeding and Patient's personal history of adenomatous colon polyps. MEDICATIONS: Propofol (Diprivan) 330 mg IV, MAC sedation, administered by CRNA, and These medications were titrated to patient response per physician's verbal order  DESCRIPTION OF PROCEDURE:   After the risks benefits and alternatives of the procedure were thoroughly explained, informed consent was obtained.  A digital rectal exam revealed no abnormalities of the rectum.   The LB [id number]-[id number]  endoscope was introduced through the anus and advanced to the cecum, which was identified by both the appendix and ileocecal valve. No adverse events experienced.   The quality of the prep was Suprep good  The instrument was then slowly withdrawn as the colon was fully examined.   COLON FINDINGS: Two sessile polyps measuring 3 and 5 mm in size were found at the hepatic flexure and in the transverse colon.  A polypectomy was performed with cold forceps and with a cold snare. The resection was complete and the polyp tissue was completely retrieved.   Moderate diverticulosis was noted The finding was in the left colon.   The colon mucosa was otherwise normal. Retroflexed views revealed internal hemorrhoids. The time to cecum=7 minutes 05 seconds.  Withdrawal  time=10 minutes 33 seconds. The scope was withdrawn and the procedure completed. COMPLICATIONS: There were no complications.  ENDOSCOPIC IMPRESSION: 1.   Two sessile polyps measuring 3 and 5 mm in size were found at the hepatic flexure and in the transverse colon; polypectomy was performed with cold forceps and with a cold snare 2.   Moderate diverticulosis was noted in the left colon and small internal hemorrhoids in rectum 3.   The colon mucosa was otherwise normal - good prep - hx adenomas (2) 2006  RECOMMENDATIONS: repaet colonoscopy pending pathology report call and arrange visit with Dr.  Leone Payor for Jan or Feb 2015 to follow-up re: hemorrhoids   eSigned:  Iva Boop, MD, [facility] 05/05/2013 10:34 AM   cc: The Patient and Ruthe Mannan MD

## 2013-05-05 NOTE — Progress Notes (Signed)
Called to room to assist during endoscopic procedure.  Patient ID and intended procedure confirmed with present staff. Received instructions for my participation in the procedure from the performing physician.  

## 2013-05-05 NOTE — Progress Notes (Signed)
Report to pacu rn, vss, bbs=clear 

## 2013-05-05 NOTE — Patient Instructions (Addendum)
I found and removed 2 tiny polyps that look benign. You also have diverticulosis but no diverticulitis now.  I will let you know pathology results and when to have another routine colonoscopy by mail.  Please call when you can and arrange a follow-up visit for Jan or feb to see how you and the hemorrhoids are.  I appreciate the opportunity to care for you. Iva Boop, MD, FACG  YOU HAD AN ENDOSCOPIC PROCEDURE TODAY AT THE [facility]: Refer to the procedure report that was given to you for any specific questions about what was found during the examination.  If the procedure report does not answer your questions, please call your gastroenterologist to clarify.  If you requested that your care partner not be given the details of your procedure findings, then the procedure report has been included in a sealed envelope for you to review at your convenience later.  YOU SHOULD EXPECT: Some feelings of bloating in the abdomen. Passage of more gas than usual.  Walking can help get rid of the air that was put into your GI tract during the procedure and reduce the bloating. If you had a lower endoscopy (such as a colonoscopy or flexible sigmoidoscopy) you may notice spotting of blood in your stool or on the toilet paper. If you underwent a bowel prep for your procedure, then you may not have a normal bowel movement for a few days.  DIET: Your first meal following the procedure should be a light meal and then it is ok to progress to your normal diet.  A half-sandwich or bowl of soup is an example of a good first meal.  Heavy or fried foods are harder to digest and may make you feel nauseous or bloated.  Likewise meals heavy in dairy and vegetables can cause extra gas to form and this can also increase the bloating.  Drink plenty of fluids but you should avoid alcoholic beverages for 24 hours.  ACTIVITY: Your care partner should take you home directly after the procedure.  You should plan to  take it easy, moving slowly for the rest of the day.  You can resume normal activity the day after the procedure however you should NOT DRIVE or use heavy machinery for 24 hours (because of the sedation medicines used during the test).    SYMPTOMS TO REPORT IMMEDIATELY: A gastroenterologist can be reached at any hour.  During normal business hours, 8:30 AM to 5:00 PM Monday through Friday, call [phone].  After hours and on weekends, please call the GI answering service at [phone] who will take a message and have the physician on call contact you.   Following lower endoscopy (colonoscopy or flexible sigmoidoscopy):  Excessive amounts of blood in the stool  Significant tenderness or worsening of abdominal pains  Swelling of the abdomen that is new, acute  Fever of 100F or higher  Following upper endoscopy (EGD)  Vomiting of blood or coffee ground material  New chest pain or pain under the shoulder blades  Painful or persistently difficult swallowing  New shortness of breath  Fever of 100F or higher  Black, tarry-looking stools  FOLLOW UP: If any biopsies were taken you will be contacted by phone or by letter within the next 1-3 weeks.  Call your gastroenterologist if you have not heard about the biopsies in 3 weeks.  Our staff will call the home number listed on your records the next business day following your procedure to check  on you and address any questions or concerns that you may have at that time regarding the information given to you following your procedure. This is a courtesy call and so if there is no answer at the home number and we have not heard from you through the emergency physician on call, we will assume that you have returned to your regular daily activities without incident.  SIGNATURES/CONFIDENTIALITY: You and/or your care partner have signed paperwork which will be entered into your electronic medical record.  These signatures attest to the fact that  that the information above on your After Visit Summary has been reviewed and is understood.  Full responsibility of the confidentiality of this discharge information lies with you and/or your care-partner.   Information on polyps,diverticulosis,hemorrhoids,& high fiber diet given to you today  Per Dr Leone Payor, it is ok to take Miralax ( over the counter) for constipation

## 2013-05-06 ENCOUNTER — Telehealth: Payer: Self-pay | Admitting: *Deleted

## 2013-05-06 NOTE — Telephone Encounter (Signed)
Heating and cooling number, did not leave message, follow-up

## 2013-05-08 ENCOUNTER — Other Ambulatory Visit: Payer: Self-pay | Admitting: Family Medicine

## 2013-05-09 ENCOUNTER — Encounter: Payer: Self-pay | Admitting: Internal Medicine

## 2013-05-09 NOTE — Progress Notes (Signed)
Quick Note:  2 adenomas - repeat colon 2019 ______

## 2013-06-17 ENCOUNTER — Telehealth: Payer: Self-pay | Admitting: Internal Medicine

## 2013-06-17 DIAGNOSIS — G4733 Obstructive sleep apnea (adult) (pediatric): Secondary | ICD-10-CM

## 2013-06-17 NOTE — Telephone Encounter (Signed)
CPAP download from November showed good control when worn, and we can set fixed pressure at 12 for trial. She needs to try harder to wear it at least 4 hours every night. Our goal is "all night, every night".  Ok to order [facility]- replacement CPAP machine, set at fixed 12 cwp, mask of choice, humidifier, supplies. Dx OSA

## 2013-06-17 NOTE — Telephone Encounter (Signed)
Spoke with pt and advised of download results per Dr Annamaria Boots.  Order placed for replacement machine and supplies per Dr Annamaria Boots

## 2013-06-17 NOTE — Telephone Encounter (Signed)
Spoke with pt and she never received results from CPAP download from 03/2013.  She faxed a copy of results to our office this am.  See Attached.  [facility] also tells pt she is eligible for a new machine because hers is giving some problems and has been used over the max amount of hours.  Please advise.

## 2013-08-19 ENCOUNTER — Other Ambulatory Visit: Payer: Self-pay | Admitting: Family Medicine

## 2013-08-19 LAB — HM DIABETES EYE EXAM

## 2013-08-20 ENCOUNTER — Encounter: Payer: Self-pay | Admitting: Family Medicine

## 2013-08-29 ENCOUNTER — Other Ambulatory Visit: Payer: Self-pay | Admitting: *Deleted

## 2013-09-01 NOTE — Telephone Encounter (Signed)
Lm on pts vm. Pt requesting medication refill through mail order. Attempted to contact pt to confirm local pharmacy as she is needing an OV and unable to receive 90D

## 2013-09-04 ENCOUNTER — Other Ambulatory Visit: Payer: Self-pay | Admitting: Family Medicine

## 2013-10-08 ENCOUNTER — Other Ambulatory Visit: Payer: Self-pay | Admitting: Family Medicine

## 2013-11-14 ENCOUNTER — Other Ambulatory Visit: Payer: Self-pay | Admitting: Family Medicine

## 2013-12-03 ENCOUNTER — Telehealth: Payer: Self-pay | Admitting: Family Medicine

## 2013-12-03 NOTE — Telephone Encounter (Signed)
Diabetic Bundle.  Pt needs lab appt to check LDL.  Left vm for pt to return call. 

## 2013-12-08 ENCOUNTER — Telehealth: Payer: Self-pay | Admitting: Family Medicine

## 2013-12-08 DIAGNOSIS — E785 Hyperlipidemia, unspecified: Secondary | ICD-10-CM

## 2013-12-08 DIAGNOSIS — E0590 Thyrotoxicosis, unspecified without thyrotoxic crisis or storm: Secondary | ICD-10-CM

## 2013-12-08 DIAGNOSIS — E119 Type 2 diabetes mellitus without complications: Secondary | ICD-10-CM

## 2013-12-08 NOTE — Telephone Encounter (Signed)
Yes ok to check.

## 2013-12-08 NOTE — Telephone Encounter (Signed)
Pt is coming in 12/09/2013 for LDL labs (diabetic bundle). She would also like to have her thyroid and A1c checked as well. Is that ok? Thank you.

## 2013-12-09 ENCOUNTER — Other Ambulatory Visit (INDEPENDENT_AMBULATORY_CARE_PROVIDER_SITE_OTHER): Payer: 59

## 2013-12-09 DIAGNOSIS — E0590 Thyrotoxicosis, unspecified without thyrotoxic crisis or storm: Secondary | ICD-10-CM

## 2013-12-09 DIAGNOSIS — E119 Type 2 diabetes mellitus without complications: Secondary | ICD-10-CM

## 2013-12-09 DIAGNOSIS — E785 Hyperlipidemia, unspecified: Secondary | ICD-10-CM

## 2013-12-09 LAB — LIPID PANEL
CHOL/HDL RATIO: 8
CHOLESTEROL: 299 mg/dL — AB (ref 0–200)
HDL: 39.8 mg/dL (ref >39.00)
LDL Cholesterol: 215 mg/dL — ABNORMAL HIGH (ref 0–99)
NonHDL: 259.2
TRIGLYCERIDES: 220 mg/dL — AB (ref 0.0–149.0)
VLDL: 44 mg/dL — ABNORMAL HIGH (ref 0.0–40.0)

## 2013-12-09 LAB — T4, FREE: FREE T4: 1.04 ng/dL (ref 0.60–1.60)

## 2013-12-09 LAB — TSH: TSH: 1.65 u[IU]/mL (ref 0.35–4.50)

## 2013-12-09 LAB — HEMOGLOBIN A1C: Hgb A1c MFr Bld: 7.8 % — ABNORMAL HIGH (ref 4.6–6.5)

## 2013-12-17 ENCOUNTER — Other Ambulatory Visit: Payer: Self-pay | Admitting: Family Medicine

## 2013-12-17 NOTE — Telephone Encounter (Signed)
Pt requesting medication refill. Per lab results, you were wanting pt to have appt to discuss labs further. Pt denied and was wanting to change lifestyle before increasing meds. See lab result notes. Ok to fill? pls advise

## 2014-01-14 ENCOUNTER — Other Ambulatory Visit: Payer: Self-pay | Admitting: Family Medicine

## 2014-02-09 ENCOUNTER — Telehealth: Payer: Self-pay | Admitting: Family Medicine

## 2014-02-09 NOTE — Telephone Encounter (Signed)
Patient called to schedule a follow up appointment on 02/16/14 to get her medication refilled.  Patient wants to know if you want her to have lab work done before her follow up appointment.

## 2014-02-09 NOTE — Telephone Encounter (Signed)
Lm on pts vm advising per Dr Deborra Medina

## 2014-02-09 NOTE — Telephone Encounter (Signed)
Ok to do labs before or after visit.

## 2014-02-12 ENCOUNTER — Other Ambulatory Visit: Payer: Self-pay | Admitting: Family Medicine

## 2014-02-12 ENCOUNTER — Other Ambulatory Visit (INDEPENDENT_AMBULATORY_CARE_PROVIDER_SITE_OTHER): Payer: 59

## 2014-02-12 ENCOUNTER — Other Ambulatory Visit: Payer: Self-pay | Admitting: *Deleted

## 2014-02-12 DIAGNOSIS — E0590 Thyrotoxicosis, unspecified without thyrotoxic crisis or storm: Secondary | ICD-10-CM

## 2014-02-12 DIAGNOSIS — I10 Essential (primary) hypertension: Secondary | ICD-10-CM

## 2014-02-12 DIAGNOSIS — E785 Hyperlipidemia, unspecified: Secondary | ICD-10-CM

## 2014-02-12 DIAGNOSIS — E119 Type 2 diabetes mellitus without complications: Secondary | ICD-10-CM

## 2014-02-12 LAB — CBC WITH DIFFERENTIAL/PLATELET
BASOS ABS: 0 10*3/uL (ref 0.0–0.1)
Basophils Relative: 0.8 % (ref 0.0–3.0)
Eosinophils Absolute: 0.2 10*3/uL (ref 0.0–0.7)
Eosinophils Relative: 3.1 % (ref 0.0–5.0)
HEMATOCRIT: 41.2 % (ref 36.0–46.0)
HEMOGLOBIN: 13.8 g/dL (ref 12.0–15.0)
Lymphocytes Relative: 27.9 % (ref 12.0–46.0)
Lymphs Abs: 1.7 10*3/uL (ref 0.7–4.0)
MCHC: 33.4 g/dL (ref 30.0–36.0)
MCV: 85.3 fl (ref 78.0–100.0)
MONOS PCT: 7 % (ref 3.0–12.0)
Monocytes Absolute: 0.4 10*3/uL (ref 0.1–1.0)
Neutro Abs: 3.7 10*3/uL (ref 1.4–7.7)
Neutrophils Relative %: 61.2 % (ref 43.0–77.0)
PLATELETS: 218 10*3/uL (ref 150.0–400.0)
RBC: 4.82 Mil/uL (ref 3.87–5.11)
RDW: 14.4 % (ref 11.5–15.5)
WBC: 6 10*3/uL (ref 4.0–10.5)

## 2014-02-12 LAB — LIPID PANEL
CHOL/HDL RATIO: 7
Cholesterol: 280 mg/dL — ABNORMAL HIGH (ref 0–200)
HDL: 38 mg/dL — AB (ref >39.00)
LDL CALC: 209 mg/dL — AB (ref 0–99)
NONHDL: 242
Triglycerides: 166 mg/dL — ABNORMAL HIGH (ref 0.0–149.0)
VLDL: 33.2 mg/dL (ref 0.0–40.0)

## 2014-02-12 LAB — COMPREHENSIVE METABOLIC PANEL
ALK PHOS: 71 U/L (ref 39–117)
ALT: 68 U/L — AB (ref 0–35)
AST: 44 U/L — ABNORMAL HIGH (ref 0–37)
Albumin: 4.5 g/dL (ref 3.5–5.2)
BUN: 12 mg/dL (ref 6–23)
CO2: 29 meq/L (ref 19–32)
Calcium: 9.8 mg/dL (ref 8.4–10.5)
Chloride: 100 mEq/L (ref 96–112)
Creatinine, Ser: 0.6 mg/dL (ref 0.4–1.2)
GFR: 100.14 mL/min (ref >60.00)
Glucose, Bld: 149 mg/dL — ABNORMAL HIGH (ref 70–99)
Potassium: 4.7 mEq/L (ref 3.5–5.1)
SODIUM: 138 meq/L (ref 135–145)
TOTAL PROTEIN: 7.4 g/dL (ref 6.0–8.3)
Total Bilirubin: 0.8 mg/dL (ref 0.2–1.2)

## 2014-02-12 LAB — HEMOGLOBIN A1C: HEMOGLOBIN A1C: 8.1 % — AB (ref 4.6–6.5)

## 2014-02-12 LAB — T4, FREE: Free T4: 1.03 ng/dL (ref 0.60–1.60)

## 2014-02-12 LAB — TSH: TSH: 1.33 u[IU]/mL (ref 0.35–4.50)

## 2014-02-12 MED ORDER — LEVOTHYROXINE SODIUM 112 MCG PO TABS: ORAL_TABLET | ORAL | Status: DC

## 2014-02-16 ENCOUNTER — Encounter: Payer: Self-pay | Admitting: Family Medicine

## 2014-02-16 ENCOUNTER — Ambulatory Visit (INDEPENDENT_AMBULATORY_CARE_PROVIDER_SITE_OTHER): Payer: 59 | Admitting: Family Medicine

## 2014-02-16 VITALS — BP 120/82 | HR 66 | Temp 98.0°F | Wt 174.8 lb

## 2014-02-16 DIAGNOSIS — R7989 Other specified abnormal findings of blood chemistry: Secondary | ICD-10-CM

## 2014-02-16 DIAGNOSIS — E785 Hyperlipidemia, unspecified: Secondary | ICD-10-CM

## 2014-02-16 DIAGNOSIS — E119 Type 2 diabetes mellitus without complications: Secondary | ICD-10-CM

## 2014-02-16 DIAGNOSIS — E0590 Thyrotoxicosis, unspecified without thyrotoxic crisis or storm: Secondary | ICD-10-CM

## 2014-02-16 DIAGNOSIS — E1165 Type 2 diabetes mellitus with hyperglycemia: Secondary | ICD-10-CM

## 2014-02-16 DIAGNOSIS — L659 Nonscarring hair loss, unspecified: Secondary | ICD-10-CM

## 2014-02-16 DIAGNOSIS — R945 Abnormal results of liver function studies: Secondary | ICD-10-CM

## 2014-02-16 MED ORDER — GLUCOSE BLOOD VI STRP: ORAL_STRIP | Status: DC

## 2014-02-16 MED ORDER — RAMIPRIL 2.5 MG PO CAPS: ORAL_CAPSULE | ORAL | Status: DC

## 2014-02-16 MED ORDER — EZETIMIBE 10 MG PO TABS: 10.0000 mg | ORAL_TABLET | Freq: Every day | ORAL | Status: DC

## 2014-02-16 MED ORDER — LEVOTHYROXINE SODIUM 112 MCG PO TABS: ORAL_TABLET | ORAL | Status: DC

## 2014-02-16 MED ORDER — METFORMIN HCL 1000 MG PO TABS: ORAL_TABLET | ORAL | Status: DC

## 2014-02-16 NOTE — Assessment & Plan Note (Signed)
New- likely multifactorial. Increased stressors with aging parents. Will check Vit B12 today to rule this out as a possible contributing factor. The patient indicates understanding of these issues and agrees with the plan.

## 2014-02-16 NOTE — Assessment & Plan Note (Signed)
Remains extremely elevated. Not wanting to go to lipid clinic. She is aware that her lipids are VERY elevated. eRx sent for zetia.  She is willing to try it again. Follow up lipid panel and CMET in 8 weeks.

## 2014-02-16 NOTE — Patient Instructions (Signed)
Good to see you. We will call you with your endocrinology referral.  We will call you with your lab results.

## 2014-02-16 NOTE — Progress Notes (Signed)
Pre visit review using our clinic review tool, if applicable. No additional management support is needed unless otherwise documented below in the visit note. 

## 2014-02-16 NOTE — Assessment & Plan Note (Signed)
Stable. Probable fatty liver. Asymptomatic.

## 2014-02-16 NOTE — Progress Notes (Signed)
63 yo pleasant female here for follow up.   DM- deteriorated.   Lab Results  Component Value Date   HGBA1C 8.1* 02/12/2014   Taking Metformin increased to 1000 mg twice daily. Admits to not checking sugars regularly. Lipids NOT controlled or at goal for diabetic. Intolerant to statins.  Used to go to lipid clinic.   Lab Results  Component Value Date   CHOL 280* 02/12/2014   HDL 38.00* 02/12/2014   LDLCALC 209* 02/12/2014   LDLDIRECT 236.3 09/26/2012   TRIG 166.0* 02/12/2014   CHOLHDL 7 02/12/2014     Thyroid dysfunction- thyroid studies normal.  Denies any symptoms of hypo or hyperthyroidism.  Lab Results  Component Value Date   TSH 1.33 02/12/2014   Patient Active Problem List   Diagnosis Date Noted  . Perianal dermatitis 04/30/2013  . Hemorrhoids, internal, with bleeding and grade 2 prolapse 04/30/2013  . Diverticulitis of colon (without mention of hemorrhage) 04/30/2013  . Chronic constipation 04/30/2013  . Personal history of failed moderate sedation 04/30/2013  . Renal calculi 02/18/2013  . Anxiety 02/23/2011  . HYPERTENSION 06/28/2007  . DERMATITIS, CONTACT, NOS 10/03/2006  . HYPERTHYROIDISM 09/18/2006  . DIABETES MELLITUS, TYPE II 09/18/2006  . HYPERLIPIDEMIA 09/18/2006  . Obstructive sleep apnea 09/18/2006  . HYPERGLYCEMIA 09/18/2006  . Personal history of colonic adenomas 05/19/2005   Past Medical History  Diagnosis Date  . Hyperlipidemia   . Foot injury 08    Left foot/leg with ?? torn muscle -PROBLEM HAS RESOLVED  . Hand pain 1/09    steroid injections--RESOLVED  . Adenomatous colon polyp   . Internal hemorrhoids   . IBS (irritable bowel syndrome)   . Hypertension   . Hypothyroidism     pt states hx of thyroid nodules  . Sleep apnea     USES CPAP - SETTING IS 14  . Diabetes mellitus     type II--ORAL MEDICATION - NO INSULIN  . Hx of renal calculi     LEFT  . Diverticulitis of colon    Past Surgical History  Procedure Laterality Date  .  Colonoscopy w/ biopsies and polypectomy  05/19/2005    adenomatous polyps  . Tubal ligation    . Wisdom teeth extracted    . Nephrolithotomy Left 01/27/2013    Procedure: NEPHROLITHOTOMY PERCUTANEOUS;  Surgeon: Dutch Gray, MD;  Location: [facility];  Service: Urology;  Laterality: Left;   History  Substance Use Topics  . Smoking status: Never Smoker   . Smokeless tobacco: Never Used  . Alcohol Use: No   Family History  Problem Relation Age of Onset  . Hyperlipidemia Mother    Allergies  Allergen Reactions  . Crestor [Rosuvastatin]     REACTION: N/V and heartburn  . Lipitor [Atorvastatin]     REACTION: Hot flashes and flu-like symptoms  . Niacin     REACTION: n/v  . Pravachol [Pravastatin Sodium]   . Pravastatin Sodium     REACTION: Neck swelling and pain in her shoulders and arms.  . Sulfonamide Derivatives     Rxn Unknown  . Zocor [Simvastatin]     REACTION: GI   Current Outpatient Prescriptions on File Prior to Visit  Medication Sig Dispense Refill  . oxyCODONE-acetaminophen (PERCOCET/ROXICET) 5-325 MG per tablet Take 1-2 tablets by mouth every 4 (four) hours as needed for severe pain.  15 tablet  0  . polyethylene glycol powder (GLYCOLAX/MIRALAX) powder Take 255 g (1 Container total) by mouth daily.  255 g  3  .  Probiotic Product (PROBIOTIC DAILY PO) Take 1 capsule by mouth daily.       No current facility-administered medications on file prior to visit.     The PMH, PSH, Social History, Family History, Medications, and allergies have been reviewed in [facility], and have been updated if relevant.   Review of Systems       See HPI No CP or SOB No dizziness, nausea or vomiting No abdominal pain No changes in bowel habits +more hair loss recently  Physical Exam BP 120/82  Pulse 66  Temp(Src) 98 F (36.7 C) (Oral)  Wt 174 lb 12 oz (79.266 kg)  SpO2 96%  General:  Well-developed,well-nourished,in no acute distress; alert,appropriate and cooperative throughout  examination Head:  normocephalic and atraumatic.   Eyes:  vision grossly intact, pupils equal, pupils round, and pupils reactive to light.   Ears:  R ear normal and L ear normal.   Lungs:  Normal respiratory effort, chest expands symmetrically. Lungs are clear to auscultation, no crackles or wheezes. Heart:  Normal rate and regular rhythm. S1 and S2 normal without gallop, murmur, click, rub or other extra sounds. Abdomen:  Bowel sounds positive,abdomen soft and non-tender without masses, organomegaly or hernias noted. Msk:  No deformity or scoliosis noted of thoracic or lumbar spine.   Extremities:  No clubbing, cyanosis, edema, or deformity noted with normal full range of motion of all joints.   Neurologic:  alert & oriented X3 and gait normal.   No tremor Skin:  Intact without suspicious lesions or rashes Psych:  Cognition and judgment appear intact. Alert and cooperative with normal attention span and concentration. No apparent delusions, illusions, hallucinations

## 2014-02-16 NOTE — Assessment & Plan Note (Signed)
Remains poorly controlled. On ACEI Cont metformin, cannot add glipizide/glucotrol due to sulfa allergy. Discussed injectable options- she would like to talk with an endocrinologist first. Referral placed.

## 2014-02-17 ENCOUNTER — Encounter: Payer: Self-pay | Admitting: *Deleted

## 2014-02-17 LAB — VITAMIN B12: VITAMIN B 12: 498 pg/mL (ref 211–911)

## 2014-02-17 LAB — VITAMIN D 25 HYDROXY (VIT D DEFICIENCY, FRACTURES): VITD: 32.08 ng/mL (ref 30.00–100.00)

## 2014-03-11 ENCOUNTER — Ambulatory Visit: Payer: 59 | Admitting: Endocrinology

## 2014-03-19 ENCOUNTER — Ambulatory Visit: Payer: 59 | Admitting: Endocrinology

## 2014-03-24 ENCOUNTER — Ambulatory Visit (INDEPENDENT_AMBULATORY_CARE_PROVIDER_SITE_OTHER): Payer: 59 | Admitting: Endocrinology

## 2014-03-24 ENCOUNTER — Other Ambulatory Visit: Payer: Self-pay | Admitting: *Deleted

## 2014-03-24 ENCOUNTER — Encounter: Payer: Self-pay | Admitting: Endocrinology

## 2014-03-24 ENCOUNTER — Encounter: Payer: 59 | Attending: Endocrinology | Admitting: Nutrition

## 2014-03-24 VITALS — BP 151/86 | HR 73 | Temp 98.7°F | Resp 14 | Ht 63.0 in | Wt 174.6 lb

## 2014-03-24 DIAGNOSIS — E1165 Type 2 diabetes mellitus with hyperglycemia: Secondary | ICD-10-CM

## 2014-03-24 DIAGNOSIS — E038 Other specified hypothyroidism: Secondary | ICD-10-CM

## 2014-03-24 DIAGNOSIS — E780 Pure hypercholesterolemia: Secondary | ICD-10-CM

## 2014-03-24 DIAGNOSIS — Z713 Dietary counseling and surveillance: Secondary | ICD-10-CM | POA: Diagnosis not present

## 2014-03-24 DIAGNOSIS — E119 Type 2 diabetes mellitus without complications: Secondary | ICD-10-CM

## 2014-03-24 DIAGNOSIS — E063 Autoimmune thyroiditis: Secondary | ICD-10-CM

## 2014-03-24 DIAGNOSIS — E7801 Familial hypercholesterolemia: Secondary | ICD-10-CM

## 2014-03-24 MED ORDER — ALBIGLUTIDE 30 MG ~~LOC~~ PEN
PEN_INJECTOR | SUBCUTANEOUS | Status: DC
Start: 2014-03-24 — End: 2014-08-12

## 2014-03-24 MED ORDER — GLUCOSE BLOOD VI STRP
ORAL_STRIP | Status: DC
Start: 2014-03-24 — End: 2014-09-09

## 2014-03-24 NOTE — Patient Instructions (Signed)
1.  Take Tanzeum once a week. 2.  Test blood sugars before and 2hr. After one meal each day--varying the meal each day. 3.  Exercise for 30-40 min. 5 days/wk.   4.  Stop eating cereal and milk for breakfast.

## 2014-03-24 NOTE — Patient Instructions (Addendum)
Please check blood sugars at least half the time about 2 hours after any meal and 3 times per week on waking up. Please bring blood sugar monitor to each visit  Start walking 20-30 minutes daily  Continue metformin unchanged  Start Tanzeum injection once a week, same day of the week.  May cause mild nausea in the first couple of days

## 2014-03-24 NOTE — Progress Notes (Addendum)
Patient ID: Joanna [name], female   DOB: 1950/09/08, 63 y.o.   MRN: [id number]           Reason for Appointment: Consultation for Type 2 Diabetes  Referring physician: Deborra Medina  History of Present Illness:          Diagnosis: Type 2 diabetes mellitus, date of diagnosis: 2010?       Past history:  For several years prior to her diagnosis she had been complaining of her feet burning Her blood sugars were mildly increased initially at diagnosis and she did try to control it with diet alone and exercise without getting them back to normal.  Her A1c had continue to be around 7% with the lowest one 6.7  Recent history: About 3 years ago she was started on metformin probably when her A1c was 7.3.  She did have some improvement in her sugar control In 2014 she was doing very well with diet and exercise and losing weight Her sugars were fairly well controlled and A1c 6.9 In 2015 her blood sugars have been progressively higher with most recent A1c 8.1 She continues to be compliant with metformin but because of stress and family issues she has not been able to exercise or watch her diet consistently She did not bring her monitor for download but is checking readings either in the morning or bedtime Has not tried any other oral hypoglycemic drugs Because of poor control she is referred here for further management       Oral hypoglycemic drugs the patient is taking are: metformin     Side effects from medications have been:none Compliance with the medical regimen: fair    Glucose monitoring:  done once or twice a day         Glucometer: One Touch.      Blood Glucose readings  159-175  PREMEAL Breakfast Lunch Dinner Bedtime  Overall   Glucose range: 150-175   170-200   Median:         Self-care: The diet that the patient has been following is: tries to limit fat intake.     Meals: 3 meals per day. Breakfast is egg, toast, sometimes cereal [name].  Lunch is a sandwich or soup.  Snacks: Cottage  cheese and fruit, no sweet drinks juices         Exercise: some walking recently, does have treadmill and is not using it as yet        Dietician visit, most recent: never.               Weight history: 165 -190   Wt Readings from Last 3 Encounters:  03/24/14 174 lb 9.6 oz (79.198 kg)  02/16/14 174 lb 12 oz (79.266 kg)  05/05/13 169 lb (76.658 kg)    Glycemic control:   Lab Results  Component Value Date   HGBA1C 8.1* 02/12/2014   HGBA1C 7.8* 12/09/2013   HGBA1C 6.9* 02/18/2013   Lab Results  Component Value Date   MICROALBUR 0.2 07/07/2008   LDLCALC 209* 02/12/2014   CREATININE 0.6 02/12/2014        Medication List       This list is accurate as of: 03/24/14 11:37 AM.  Always use your most recent med list.               ezetimibe 10 MG tablet  Commonly known as:  ZETIA  Take 1 tablet (10 mg total) by mouth daily. Take one tablet by mouth daily  glucose blood test strip  Commonly known as:  ONE TOUCH ULTRA TEST  USE TO CHECK BLOOD SUGAR UP TO TWO TIMES DAILY     levothyroxine 112 MCG tablet  Commonly known as:  SYNTHROID, LEVOTHROID  TAKE 1 TABLET (112 MCG TOTAL) BY MOUTH DAILY.     metFORMIN 1000 MG tablet  Commonly known as:  GLUCOPHAGE  TAKE 1 TABLET TWICE A DAY     oxyCODONE-acetaminophen 5-325 MG per tablet  Commonly known as:  PERCOCET/ROXICET  Take 1-2 tablets by mouth every 4 (four) hours as needed for severe pain.     polyethylene glycol powder powder  Commonly known as:  GLYCOLAX/MIRALAX  Take 255 g (1 Container total) by mouth daily.     PROBIOTIC DAILY PO  Take 1 capsule by mouth daily.     ramipril 2.5 MG capsule  Commonly known as:  ALTACE  TAKE ONE CAPSULE BY MOUTH EVERY DAY        Allergies:  Allergies  Allergen Reactions  . Crestor [Rosuvastatin]     REACTION: N/V and heartburn  . Lipitor [Atorvastatin]     REACTION: Hot flashes and flu-like symptoms  . Niacin     REACTION: n/v  . Pravachol [Pravastatin Sodium]   .  Pravastatin Sodium     REACTION: Neck swelling and pain in her shoulders and arms.  . Sulfonamide Derivatives     Rxn Unknown  . Zocor [Simvastatin]     REACTION: GI    Past Medical History  Diagnosis Date  . Hyperlipidemia   . Foot injury 08    Left foot/leg with ?? torn muscle -PROBLEM HAS RESOLVED  . Hand pain 1/09    steroid injections--RESOLVED  . Adenomatous colon polyp   . Internal hemorrhoids   . IBS (irritable bowel syndrome)   . Hypertension   . Hypothyroidism     pt states hx of thyroid nodules  . Sleep apnea     USES CPAP - SETTING IS 14  . Diabetes mellitus     type II--ORAL MEDICATION - NO INSULIN  . Hx of renal calculi     LEFT  . Diverticulitis of colon     Past Surgical History  Procedure Laterality Date  . Colonoscopy w/ biopsies and polypectomy  05/19/2005    adenomatous polyps  . Tubal ligation    . Wisdom teeth extracted    . Nephrolithotomy Left 01/27/2013    Procedure: NEPHROLITHOTOMY PERCUTANEOUS;  Surgeon: Dutch Gray, MD;  Location: [facility];  Service: Urology;  Laterality: Left;    Family History  Problem Relation Age of Onset  . Hyperlipidemia Mother     Social History:  reports that she has never smoked. She has never used smokeless tobacco. She reports that she does not drink alcohol or use illicit drugs.    Review of Systems       Vision is normal. Most recent eye exam was 6/15       Lipids: she has long-standing severe hypercholesterolemia and has been intolerant to all statin drugs with various reactions Crestor caused vomiting Lipitor caused flulike symptoms and hot flashes, pravastatin caused neck swelling and upper body pain, Zocor caused GI upset.   Currently only on Zetia       Lab Results  Component Value Date   CHOL 280* 02/12/2014   HDL 38.00* 02/12/2014   LDLCALC 209* 02/12/2014   LDLDIRECT 236.3 09/26/2012   TRIG 166.0* 02/12/2014   CHOLHDL 7 02/12/2014  Skin: No rash or infections She has had  some thinning of her hair     Thyroid:  No  unusual fatigue. She has had hypothyroidism for about 10 years and initially had a goiter also. Her last thyroid ultrasound showed only a 6 mm nodule which was stable Thyroid levels have been fairly good for the last year, highest TSH probably about 9.2  Lab Results  Component Value Date   FREET4 1.03 02/12/2014   FREET4 1.04 12/09/2013   FREET4 0.90 09/26/2012   TSH 1.33 02/12/2014   TSH 1.65 12/09/2013   TSH 5.26 02/18/2013        The blood pressure has been  Controlled with ramipril 2.5 mg     No swelling of feet.     No shortness of breath or chest tightness  on exertion.     She uses CPAP for her sleep apnea although unable to use it consistently     Bowel habits: mild chronic constipation      No joint  pains.          No history of Numbness, tingling but has some tolerable burning in feet     LABS:  No visits with results within 1 Week(s) from this visit. Latest known visit with results is:  Office Visit on 02/16/2014  Component Date Value Ref Range Status  . Vitamin B-12 02/16/2014 498  211 - 911 pg/mL Final  . VITD 02/16/2014 32.08  30.00 - 100.00 ng/mL Final    Physical Examination:  BP 151/86 mmHg  Pulse 73  Temp(Src) 98.7 F (37.1 C)  Resp 14  Ht 5\' 3"  (1.6 m)  Wt 174 lb 9.6 oz (79.198 kg)  BMI 30.94 kg/m2  SpO2 96%  GENERAL:         Patient has mild generalized obesity especially abdominal.   HEENT:         Eye exam shows normal external appearance. Fundus exam shows no retinopathy. Oral exam shows normal mucosa .  NECK:         General:  Neck exam shows no lymphadenopathy. Carotids are normal to palpation and no bruit heard.  Thyroid is not enlarged and no nodules felt.   LUNGS:         Chest is symmetrical. Lungs are clear to auscultation.Marland Kitchen   HEART:         Heart sounds:  S1 and S2 are normal. No murmurs or clicks heard., no S3 or S4.   ABDOMEN:   There is no distention present. Liver and spleen  are not palpable. No other mass or tenderness present.  EXTREMITIES:     There is no edema. No skin lesions present.Marland Kitchen  NEUROLOGICAL:   Vibration sense is minimally reduced in toes. Ankle jerks are absent bilaterally but biceps reflexes appear normal.   Diabetic foot exam shows normal monofilament sensation in the toes and plantar surfaces, no skin lesions or ulcers on the feet and normal pedal pulses  MUSCULOSKELETAL:       There is no enlargement or deformity of the joints. Spine is normal to inspection.Marland Kitchen   SKIN:       No rash or lesions of concern.        ASSESSMENT:  Diabetes type 2, uncontrolled with A1c 8.1% and BMI of 31    She has been on metformin monotherapy and likely has progression of her diabetes She does have a fairly good diet generally but has had variability in her weight He has  not had any diabetes education and can benefit from meal planning instructions She can increase her exercise frequency and duration   Because of her abdominal obesity she is a good candidate for an additional drug that can facilitate weight loss also  Discussed with the patient the nature of GLP-1 drugs, the action on various organ systems, how they benefit blood glucose control, as well as the benefit of weight loss and  increase satiety . Explained possible side effects especially nausea and vomiting; discussed safety information in package insert.  Discussed checking blood sugars at various times including after meals to see the effects of various foods  Complications: none evident, needs urine microalbumin  Familial hypercholesterolemia with LDL over 200 despite taking Zetia.   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  No goiter.   Reassured her that her subcentimeter nodule that she had previously is not consequential  PLAN:   Start Tanzeum 30 mg weekly  The nurse educator demonstrated to her the medication injection device and injection technique  Discussed injection sites and titration of Tanzeum starting with 30 mg weekly and then considerincreasing to 50 mg if no symptoms of nausea along with  inadequate control. Patient brochure on Tanzeum and co-pay card given  Continue metformin unchanged, reassured her about the safety of this drug  Consultation with dietitian  Exercise at least 5 days a week  Start doing postprandial readings at least once a day  To bring home glucose monitor for download on next visit  Counseling time over 50% of today's 60 minute office visit   Lorraina Spring 03/24/2014, 11:37 AM   Note: This office note was prepared with Dragon voice recognition system technology. Any transcriptional errors that result from this process are unintentional.

## 2014-03-24 NOTE — Progress Notes (Signed)
We discussed how this Tanzeum works to control her diabetes, and how/when to take it.  She was given a starter kit with directions for mixing and taking the Tanzium.  She reported good understanding of this, but was not very happy to begin this treatment.  We discussed the fact that this will help her to loose weight, and that she needs to exercise for 30-40 min. 5 days/wk.   She denies drinking sweet drinks, or fruit juices, but does eat cold cereal and milk 3-4 mornings/wk.  Other suggestions were given for breakfast for her.  She is willing to make these needed diet changes.   We also discussed the need to test blood sugars 2hr. After eating.  She was told to test her blood sugars before and 2hr. After one meal/day--varying the meal each day.  She agreed to do this. She had no final questions.

## 2014-04-22 ENCOUNTER — Telehealth: Payer: Self-pay | Admitting: Endocrinology

## 2014-04-22 ENCOUNTER — Ambulatory Visit: Payer: 59 | Admitting: Endocrinology

## 2014-04-22 NOTE — Telephone Encounter (Signed)
Patient no showed today's appt. Please advise on how to follow up. °A. No follow up necessary. °B. Follow up urgent. Contact patient immediately. °C. Follow up necessary. Contact patient and schedule visit in ___ days. °D. Follow up advised. Contact patient and schedule visit in ____weeks. ° °

## 2014-04-23 NOTE — Telephone Encounter (Signed)
Follow up advised. Contact patient and schedule visit asap

## 2014-04-28 ENCOUNTER — Other Ambulatory Visit: Payer: Self-pay | Admitting: *Deleted

## 2014-04-28 MED ORDER — RAMIPRIL 2.5 MG PO CAPS: ORAL_CAPSULE | ORAL | Status: DC

## 2014-04-28 MED ORDER — METFORMIN HCL 1000 MG PO TABS: ORAL_TABLET | ORAL | Status: DC

## 2014-05-01 ENCOUNTER — Telehealth: Payer: Self-pay | Admitting: Endocrinology

## 2014-05-01 NOTE — Telephone Encounter (Signed)
Yes

## 2014-05-01 NOTE — Telephone Encounter (Signed)
Patient states that her PCP would Dr. Dwyane Dee to start prescribing her metformin    Please send to her pharmacy   Pharmacy: [facility]    Thank you

## 2014-05-01 NOTE — Telephone Encounter (Signed)
Please see below and advise.

## 2014-05-04 ENCOUNTER — Other Ambulatory Visit: Payer: Self-pay | Admitting: *Deleted

## 2014-05-04 MED ORDER — METFORMIN HCL 1000 MG PO TABS: ORAL_TABLET | ORAL | Status: DC

## 2014-05-04 NOTE — Telephone Encounter (Signed)
rx sent

## 2014-05-19 ENCOUNTER — Ambulatory Visit (INDEPENDENT_AMBULATORY_CARE_PROVIDER_SITE_OTHER): Payer: 59 | Admitting: Endocrinology

## 2014-05-19 ENCOUNTER — Encounter: Payer: Self-pay | Admitting: Endocrinology

## 2014-05-19 VITALS — BP 158/90 | HR 77 | Temp 97.9°F | Resp 14 | Ht 63.0 in | Wt 171.4 lb

## 2014-05-19 DIAGNOSIS — E780 Pure hypercholesterolemia: Secondary | ICD-10-CM

## 2014-05-19 DIAGNOSIS — E038 Other specified hypothyroidism: Secondary | ICD-10-CM

## 2014-05-19 DIAGNOSIS — E1165 Type 2 diabetes mellitus with hyperglycemia: Secondary | ICD-10-CM

## 2014-05-19 DIAGNOSIS — E119 Type 2 diabetes mellitus without complications: Secondary | ICD-10-CM

## 2014-05-19 DIAGNOSIS — E063 Autoimmune thyroiditis: Secondary | ICD-10-CM

## 2014-05-19 DIAGNOSIS — E7801 Familial hypercholesterolemia: Secondary | ICD-10-CM

## 2014-05-19 NOTE — Patient Instructions (Addendum)
Start Tanzeum every Wednesday Metformin 1/2 in am after 1 week  Walk 5 times a week  Check Repatha for cholesterol  Please check blood sugars at least half the time about 2 hours after any meal and times per week on waking up. Please bring blood sugar monitor to each visit

## 2014-05-19 NOTE — Progress Notes (Signed)
Patient ID: Joanna [name], female   DOB: May 08, 1951, 63 y.o.   MRN: [id number]           Reason for Appointment: F/u for Type 2 Diabetes  Referring physician: Deborra Medina  History of Present Illness:          Diagnosis: Type 2 diabetes mellitus, date of diagnosis: 2010?       Past history:  For several years prior to her diagnosis she had been complaining of her feet burning Her blood sugars were mildly increased initially at diagnosis and she did try to control it with diet alone and exercise without getting them back to normal.  Her A1c had continue to be around 7% with the lowest one 6.7 About 2012 she was started on metformin probably when her A1c was 7.3.   In 2014 she was doing very well with diet and exercise and losing weight Her sugars were fairly well controlled and A1c 6.9 Recent history:  In 2015 her blood sugars have been higher with A1c 8.1% On her initial consultation she was advised to start Tanzeum for multiple benefits including long-term control and weight loss. She has not taken this since she has had a lot of family issues and also her initial medication being left out in a hot car She did not call to report this.  Currently she is still on metformin 1 g twice a day Her blood sugars continue to be higher especially fasting and bedtime About 2 days ago since she had very little for lunch she felt a little hypoglycemic with glucose 71 which is unusual for her. She has not had any recent labs She thinks that overall she is trying to watch her diet better and has lost 3 pounds.  Also planning to go on a programmed diet with low carbohydrates       Oral hypoglycemic drugs the patient is taking are: metformin 1 g twice a day      Side effects from medications have been: none Compliance with the medical regimen: fair    Glucose monitoring:  done once or twice a day         Glucometer: One Touch ultra mini, this has the wrong date programmed .      Blood Glucose readings    PRE-MEAL Breakfast  2 PM  Dinner Bedtime Overall  Glucose range:  168-188   221    184-304    Mean/median:         Self-care: The diet that the patient has been following is: tries to limit fat intake.     Meals: 3 meals per day. Breakfast is egg, toast, sometimes cereal Joanna [name].   Lunch is a sandwich or soup.  Snacks: Cottage cheese and fruit, no sweet drinks juices         Exercise: a little walking, does have treadmill      Dietician visit, most recent: never.               Weight history: 165 -190   Wt Readings from Last 3 Encounters:  05/19/14 171 lb 6.4 oz (77.747 kg)  03/24/14 174 lb 9.6 oz (79.198 kg)  02/16/14 174 lb 12 oz (79.266 kg)    Glycemic control:   Lab Results  Component Value Date   HGBA1C 8.1* 02/12/2014   HGBA1C 7.8* 12/09/2013   HGBA1C 6.9* 02/18/2013   Lab Results  Component Value Date   MICROALBUR 0.2 07/07/2008   LDLCALC 209* 02/12/2014   CREATININE  0.6 02/12/2014        Medication List       This list is accurate as of: 05/19/14  8:07 AM.  Always use your most recent med list.               Albiglutide 30 MG Pen  Commonly known as:  TANZEUM  Inject once a week     glucose blood test strip  Commonly known as:  ONE TOUCH ULTRA TEST  USE TO CHECK BLOOD SUGAR UP TO TWO TIMES DAILY DX CODE E11.9     levothyroxine 112 MCG tablet  Commonly known as:  SYNTHROID, LEVOTHROID  TAKE 1 TABLET (112 MCG TOTAL) BY MOUTH DAILY.     metFORMIN 1000 MG tablet  Commonly known as:  GLUCOPHAGE  TAKE 1 TABLET TWICE A DAY     ramipril 2.5 MG capsule  Commonly known as:  ALTACE  TAKE ONE CAPSULE BY MOUTH EVERY DAY        Allergies:  Allergies  Allergen Reactions  . Crestor [Rosuvastatin]     REACTION: N/V and heartburn  . Lipitor [Atorvastatin]     REACTION: Hot flashes and flu-like symptoms  . Niacin     REACTION: n/v  . Pravachol [Pravastatin Sodium]   . Pravastatin Sodium     REACTION: Neck swelling and pain in her shoulders and  arms.  . Sulfonamide Derivatives     Rxn Unknown  . Zocor [Simvastatin]     REACTION: GI    Past Medical History  Diagnosis Date  . Hyperlipidemia   . Foot injury 08    Left foot/leg with ?? torn muscle -PROBLEM HAS RESOLVED  . Hand pain 1/09    steroid injections--RESOLVED  . Adenomatous colon polyp   . Internal hemorrhoids   . IBS (irritable bowel syndrome)   . Hypertension   . Hypothyroidism     pt states hx of thyroid nodules  . Sleep apnea     USES CPAP - SETTING IS 14  . Diabetes mellitus     type II--ORAL MEDICATION - NO INSULIN  . Hx of renal calculi     LEFT  . Diverticulitis of colon     Past Surgical History  Procedure Laterality Date  . Colonoscopy w/ biopsies and polypectomy  05/19/2005    adenomatous polyps  . Tubal ligation    . Wisdom teeth extracted    . Nephrolithotomy Left 01/27/2013    Procedure: NEPHROLITHOTOMY PERCUTANEOUS;  Surgeon: Dutch Gray, MD;  Location: [facility];  Service: Urology;  Laterality: Left;    Family History  Problem Relation Age of Onset  . Hyperlipidemia Mother   . Thyroid disease Daughter     Social History:  reports that she has never smoked. She has never used smokeless tobacco. She reports that she does not drink alcohol or use illicit drugs.    Review of Systems         Lipids: she has long-standing severe hypercholesterolemia and has been intolerant to all statin drugs with various reactions Crestor caused vomiting.  Lipitor caused flulike symptoms and hot flashes, pravastatin caused neck swelling and upper body pain, Zocor caused GI upset.   Currently only on Zetia  although she ran out of this recently       Lab Results  Component Value Date   CHOL 280* 02/12/2014   HDL 38.00* 02/12/2014   LDLCALC 209* 02/12/2014   LDLDIRECT 236.3 09/26/2012   TRIG 166.0* 02/12/2014   CHOLHDL 7  02/12/2014               Thyroid:  No  unusual fatigue. She has had hypothyroidism for about 10 years and initially had a goiter  also. Her last thyroid ultrasound showed only a 6 mm nodule which was stable Thyroid levels have been fairly good for the last year, highest TSH probably about 9.2  Lab Results  Component Value Date   FREET4 1.03 02/12/2014   FREET4 1.04 12/09/2013   FREET4 0.90 09/26/2012   TSH 1.33 02/12/2014   TSH 1.65 12/09/2013   TSH 5.26 02/18/2013        The blood pressure has been previously well controlled, appears to be high when she comes here.  Only on ramipril 2.5 mg          No history of Numbness, tingling but has some tolerable burning in feet     LABS:  No visits with results within 1 Week(s) from this visit. Latest known visit with results is:  Office Visit on 02/16/2014  Component Date Value Ref Range Status  . Vitamin B-12 02/16/2014 498  211 - 911 pg/mL Final  . VITD 02/16/2014 32.08  30.00 - 100.00 ng/mL Final    Physical Examination:  BP 158/90 mmHg  Pulse 77  Temp(Src) 97.9 F (36.6 C)  Resp 14  Ht 5\' 3"  (1.6 m)  Wt 171 lb 6.4 oz (77.747 kg)  BMI 30.37 kg/m2  SpO2 97%         ASSESSMENT/PLAN:   Diabetes type 2, uncontrolled with A1c 8.1%  and BMI over 30  She is back for follow-up about 2 months after she was initially seen and instructed on Tanzeum She has not taken this but is interested in starting this.  She however wants to be instructed on use of the pen again Again discussed the nature of the medication, how to use it, benefits and possible side effects She also needs to restart exercise regimen going back on it  Discussed checking blood sugars at various times including after meals to see the effects of various foods  She will start using a One Touch ultra monitor since her current monitor is relatively old and has incorrect setting.  She was instructed on how to monitor with this and mark the  postprandial readings  Familial hypercholesterolemia with LDL over 200 despite taking Zetia.   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  Will continue to monitor  Patient Instructions  Start Tanzeum every Wednesday Metformin 1/2 in am after 1 week  Walk 5 times a week  Check Repatha for cholesterol  Please check blood sugars at least half the time about 2 hours after any meal and times per week on waking up. Please bring blood sugar monitor to each visit     Counseling time over 50% of  her 25 minute office visit   Marely Apgar 05/19/2014, 8:07 AM   Note: This office note was prepared with Estate agent. Any transcriptional errors that result from this process are unintentional.

## 2014-06-02 ENCOUNTER — Encounter: Payer: 59 | Attending: Endocrinology | Admitting: Nutrition

## 2014-06-02 VITALS — Wt 164.5 lb

## 2014-06-02 DIAGNOSIS — E119 Type 2 diabetes mellitus without complications: Secondary | ICD-10-CM | POA: Diagnosis not present

## 2014-06-02 DIAGNOSIS — E1165 Type 2 diabetes mellitus with hyperglycemia: Secondary | ICD-10-CM

## 2014-06-02 DIAGNOSIS — Z713 Dietary counseling and surveillance: Secondary | ICD-10-CM | POA: Insufficient documentation

## 2014-06-03 NOTE — Progress Notes (Signed)
Patient did not bring her meter today.  Says FBS today was 142, but is ususally around 120 acS.   Weight is down 8 pounds  She started a 1200 calorie diet with her 2 daughters, and is using "My fitness pal" to log in meals, exercise (walking for 30-24min. Daily in 10 min. Intervals.   Bfast:  1 egg, with canadian bacon, ad 1/2 c oatmeal and 1/2 banana.  Water, or black coffee to drink Lunch: 1/2 sandwich, with water to drink Supper 3 ounces fish, or chicken grilled, 2 non starchy veg, and 1 starchy veg.    Says has noticed in last 3 days, no pain in feet at night, and not feeling hungry.  Denies snacking between meals or after supper.  Will drink hot tea, or hot water with lemon.

## 2014-06-03 NOTE — Patient Instructions (Signed)
Continue on 1200 calorie diet  Continue walking 3-4 times/wk for 30 min. Test blood sugars before and 2hr. After one meal--alternating the meal each day.

## 2014-06-06 ENCOUNTER — Other Ambulatory Visit: Payer: Self-pay | Admitting: Family Medicine

## 2014-06-16 ENCOUNTER — Other Ambulatory Visit: Payer: 59

## 2014-06-18 ENCOUNTER — Ambulatory Visit: Payer: 59 | Admitting: Endocrinology

## 2014-06-24 ENCOUNTER — Other Ambulatory Visit: Payer: Self-pay | Admitting: Family Medicine

## 2014-07-16 ENCOUNTER — Other Ambulatory Visit: Payer: Self-pay

## 2014-07-16 ENCOUNTER — Other Ambulatory Visit (INDEPENDENT_AMBULATORY_CARE_PROVIDER_SITE_OTHER): Payer: 59

## 2014-07-16 DIAGNOSIS — E1165 Type 2 diabetes mellitus with hyperglycemia: Secondary | ICD-10-CM

## 2014-07-16 DIAGNOSIS — E119 Type 2 diabetes mellitus without complications: Secondary | ICD-10-CM

## 2014-07-16 LAB — MICROALBUMIN / CREATININE URINE RATIO
Creatinine,U: 104.9 mg/dL
Microalb Creat Ratio: 1.9 mg/g (ref 0.0–30.0)
Microalb, Ur: 2 mg/dL — ABNORMAL HIGH (ref 0.0–1.9)

## 2014-07-16 LAB — BASIC METABOLIC PANEL
BUN: 15 mg/dL (ref 6–23)
CO2: 28 mEq/L (ref 19–32)
Calcium: 9.9 mg/dL (ref 8.4–10.5)
Chloride: 102 mEq/L (ref 96–112)
Creatinine, Ser: 0.68 mg/dL (ref 0.40–1.20)
GFR: 93.24 mL/min (ref >60.00)
GLUCOSE: 163 mg/dL — AB (ref 70–99)
Potassium: 4.4 mEq/L (ref 3.5–5.1)
SODIUM: 137 meq/L (ref 135–145)

## 2014-07-20 LAB — FRUCTOSAMINE: Fructosamine: 274 umol/L — ABNORMAL HIGH (ref 190–270)

## 2014-07-22 ENCOUNTER — Encounter: Payer: Self-pay | Admitting: Endocrinology

## 2014-07-22 ENCOUNTER — Ambulatory Visit (INDEPENDENT_AMBULATORY_CARE_PROVIDER_SITE_OTHER): Payer: Self-pay | Admitting: Endocrinology

## 2014-07-22 VITALS — BP 150/85 | HR 79 | Temp 98.0°F | Resp 14 | Ht 63.0 in | Wt 169.6 lb

## 2014-07-22 DIAGNOSIS — E780 Pure hypercholesterolemia: Secondary | ICD-10-CM

## 2014-07-22 DIAGNOSIS — E038 Other specified hypothyroidism: Secondary | ICD-10-CM

## 2014-07-22 DIAGNOSIS — E1165 Type 2 diabetes mellitus with hyperglycemia: Secondary | ICD-10-CM

## 2014-07-22 DIAGNOSIS — E7801 Familial hypercholesterolemia: Secondary | ICD-10-CM

## 2014-07-22 DIAGNOSIS — IMO0002 Reserved for concepts with insufficient information to code with codable children: Secondary | ICD-10-CM

## 2014-07-22 DIAGNOSIS — E063 Autoimmune thyroiditis: Secondary | ICD-10-CM

## 2014-07-22 NOTE — Patient Instructions (Addendum)
Metformin 1 tab twice daily  Please check blood sugars at least half the time about 2 hours after any meal and 3 times per week on waking up. Please bring blood sugar monitor to each visit. Recommended blood sugar levels about 2 hours after meal is 140-180 and on waking up 90-130  Exercise daily

## 2014-07-22 NOTE — Progress Notes (Signed)
Patient ID: Joanna [name], female   DOB: [birth year], 64 y.o.   MRN: [id number]           Reason for Appointment: F/u for Type 2 Diabetes  Referring physician: Deborra Medina  History of Present Illness:          Diagnosis: Type 2 diabetes mellitus, date of diagnosis: 2010?       Past history:  For several years prior to her diagnosis she had been complaining of her feet burning Her blood sugars were mildly increased initially at diagnosis and she did try to control it with diet alone and exercise without getting them back to normal.  Her A1c had continue to be around 7% with the lowest one 6.7 About 2012 she was started on metformin probably when her A1c was 7.3.   In 2014 she was doing very well with diet and exercise and losing weight Her sugars were fairly well controlled and A1c 6.9  Recent history:   In 2015 her blood sugars had been higher with A1c 8.1% On her initial consultation she was advised to start Tanzeum for multiple benefits including long-term control and weight loss but she did not do this until her visit in late December 2015 She appears to have benefited from taking the 30 mg dose of Tanzeum weekly which she started about 2 months ago. Her blood sugars are on average much better although not consistent and recently somewhat higher including after meals. She did miss her dose last week and took it 3 days later but her blood sugars appear to have been starting to go up before this She did have much better fasting blood sugars about 2 weeks ago with readings as low as 127  She now reveals that she is taking only half the dose of metformin and she was under the impression that this needs to be taken only when the blood sugar is high. Also has had significant amount of stress in the last 2 weeks Has not found a time for exercising, was walking on treadmill that she has at home She has seen the diabetes educator and the diet has been reviewed  Factors causing her blood sugars to be  higher recently include not taking the full dose of metformin, stress as well as lack of consistent exercise which she had been doing previously Her weight has fluctuated and overall has lost only 2 pounds.       Oral hypoglycemic drugs the patient is taking are: metformin 1 g once a day      Side effects from medications have been: none Compliance with the medical regimen: fair    Glucose monitoring:  done once or twice a day         Glucometer: One Touch ultra mini, this has the wrong date programmed .      Blood Glucose readings    PRE-MEAL Breakfast Lunch  4-6 PM   PCS  Overall  Glucose range:  107-191   111, 146   90-182   127-266    Mean/median:  145     165   146     Self-care: The diet that the patient has been following is: tries to limit fat intake.     Meals: 3 meals per day. Breakfast is egg, toast, sometimes cereal [name].   Lunch is a sandwich or soup.  Snacks: Cottage cheese and fruit, no sweet drinks juices         Exercise: not walking, does have  treadmill      Dietician visit, most recent: never.               Weight history: 165 -190   Wt Readings from Last 3 Encounters:  07/22/14 169 lb 9.6 oz (76.93 kg)  06/03/14 164 lb 8 oz (74.617 kg)  05/19/14 171 lb 6.4 oz (77.747 kg)    Glycemic control:   Lab Results  Component Value Date   HGBA1C 8.1* 02/12/2014   HGBA1C 7.8* 12/09/2013   HGBA1C 6.9* 02/18/2013   Lab Results  Component Value Date   MICROALBUR 2.0* 07/16/2014   LDLCALC 209* 02/12/2014   CREATININE 0.68 07/16/2014        Medication List       This list is accurate as of: 07/22/14 11:59 PM.  Always use your most recent med list.               Albiglutide 30 MG Pen  Commonly known as:  TANZEUM  Inject once a week     glucose blood test strip  Commonly known as:  ONE TOUCH ULTRA TEST  USE TO CHECK BLOOD SUGAR UP TO TWO TIMES DAILY DX CODE E11.9     levothyroxine 112 MCG tablet  Commonly known as:  SYNTHROID, LEVOTHROID  TAKE 1  TABLET (112 MCG TOTAL) BY MOUTH DAILY.     metFORMIN 1000 MG tablet  Commonly known as:  GLUCOPHAGE  TAKE 1 TABLET TWICE A DAY     ramipril 2.5 MG capsule  Commonly known as:  ALTACE  TAKE ONE CAPSULE BY MOUTH EVERY DAY        Allergies:  Allergies  Allergen Reactions  . Crestor [Rosuvastatin]     REACTION: N/V and heartburn  . Lipitor [Atorvastatin]     REACTION: Hot flashes and flu-like symptoms  . Niacin     REACTION: n/v  . Pravachol [Pravastatin Sodium]   . Pravastatin Sodium     REACTION: Neck swelling and pain in her shoulders and arms.  . Sulfonamide Derivatives     Rxn Unknown  . Zocor [Simvastatin]     REACTION: GI    Past Medical History  Diagnosis Date  . Hyperlipidemia   . Foot injury 08    Left foot/leg with ?? torn muscle -PROBLEM HAS RESOLVED  . Hand pain 1/09    steroid injections--RESOLVED  . Adenomatous colon polyp   . Internal hemorrhoids   . IBS (irritable bowel syndrome)   . Hypertension   . Hypothyroidism     pt states hx of thyroid nodules  . Sleep apnea     USES CPAP - SETTING IS 14  . Diabetes mellitus     type II--ORAL MEDICATION - NO INSULIN  . Hx of renal calculi     LEFT  . Diverticulitis of colon     Past Surgical History  Procedure Laterality Date  . Colonoscopy w/ biopsies and polypectomy  05/19/2005    adenomatous polyps  . Tubal ligation    . Wisdom teeth extracted    . Nephrolithotomy Left 01/27/2013    Procedure: NEPHROLITHOTOMY PERCUTANEOUS;  Surgeon: Dutch Gray, MD;  Location: [facility];  Service: Urology;  Laterality: Left;    Family History  Problem Relation Age of Onset  . Hyperlipidemia Mother   . Thyroid disease Daughter   . Diabetes Neg Hx     Social History:  reports that she has never smoked. She has never used smokeless tobacco. She reports that she does not  drink alcohol or use illicit drugs.    Review of Systems        Lipids: she has long-standing severe hypercholesterolemia and has been  intolerant to all statin drugs with various reactions Crestor caused vomiting.  Lipitor caused flulike symptoms and hot flashes, pravastatin caused neck swelling and upper body pain, Zocor caused GI upset.   Currently only on Zetia  although she ran out of this recently       Lab Results  Component Value Date   CHOL 280* 02/12/2014   HDL 38.00* 02/12/2014   LDLCALC 209* 02/12/2014   LDLDIRECT 236.3 09/26/2012   TRIG 166.0* 02/12/2014   CHOLHDL 7 02/12/2014               Thyroid:  No  unusual fatigue. She has had hypothyroidism for about 10 years and initially had a goiter also. Her last thyroid ultrasound showed only a 6 mm nodule which was stable Thyroid levels have been fairly normal in 2015, highest TSH probably about 9.2  Lab Results  Component Value Date   FREET4 1.03 02/12/2014   FREET4 1.04 12/09/2013   FREET4 0.90 09/26/2012   TSH 1.33 02/12/2014   TSH 1.65 12/09/2013   TSH 5.26 02/18/2013        The blood pressure has been previously well controlled, appears to be high when she comes here.  Only on ramipril 2.5 mg          No history of Numbness, tingling but has some  burning in feet     LABS:  Appointment on 07/16/2014  Component Date Value Ref Range Status  . Microalb, Ur 07/16/2014 2.0* 0.0 - 1.9 mg/dL Final  . Creatinine,U 07/16/2014 104.9   Final  . Microalb Creat Ratio 07/16/2014 1.9  0.0 - 30.0 mg/g Final  . Sodium 07/16/2014 137  135 - 145 mEq/L Final  . Potassium 07/16/2014 4.4  3.5 - 5.1 mEq/L Final  . Chloride 07/16/2014 102  96 - 112 mEq/L Final  . CO2 07/16/2014 28  19 - 32 mEq/L Final  . Glucose, Bld 07/16/2014 163* 70 - 99 mg/dL Final  . BUN 07/16/2014 15  6 - 23 mg/dL Final  . Creatinine, Ser 07/16/2014 0.68  0.40 - 1.20 mg/dL Final  . Calcium 07/16/2014 9.9  8.4 - 10.5 mg/dL Final  . GFR 07/16/2014 93.24  >60.00 mL/min Final  . Fructosamine 07/16/2014 274* 190 - 270 umol/L Final    Physical Examination:  BP 150/85 mmHg  Pulse 79   Temp(Src) 98 F (36.7 C)  Resp 14  Ht 5\' 3"  (1.6 m)  Wt 169 lb 9.6 oz (76.93 kg)  BMI 30.05 kg/m2  SpO2 95%         ASSESSMENT/PLAN:   Diabetes type 2, with A1c 8.1% previously  See history of present illness for details of her current blood sugar patterns and management  Her blood sugars have improved with taking Tanzeum She is taking the 30 mg and has no difficulty and BMI over 30  She is back for follow-up about 2 months after she was started on Tanzeum Although her blood sugars are relatively better in the mornings she still has some high readings in the evenings and needs better postprandial control. Discussed possibly giving her 50 mg of Tanzeum but she does not want to increase this now She also did not understand the need to take consistent doses of metformin of 1 g twice a day and is taking mostly 500 mg  twice a day She also can do better with her exercise regimen which she has not done any Also she thinks she has had much more stress for the last 2 weeks which could affect her sugars Her fructosamine is just above normal which is probably adequate for now For now will have her improve the compliance with medications, diet and exercise and have A1c checked  Familial hypercholesterolemia with LDL over 200 despite taking Zetia.   Since she is intolerant to statin drugs again discussed PCSK 9 inhibitor even though she is still fairly reluctant to consider any medications. She thinks her mother had marked hypercholesterolemia but has not had any coronary artery disease, she does not have any other family history of diabetes however. 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  Will continue to monitor  Patient Instructions  Metformin 1 tab  twice daily  Please check blood sugars at least half the time about 2 hours after any meal and 3 times per week on waking up. Please bring blood sugar monitor to each visit. Recommended blood sugar levels about 2 hours after meal is 140-180 and on waking up 90-130  Exercise daily    Counseling time over 50% of  her 25 minute office visit   Keniel Ralston 07/23/2014, 10:23 AM   Note: This office note was prepared with Estate agent. Any transcriptional errors that result from this process are unintentional.

## 2014-07-31 ENCOUNTER — Other Ambulatory Visit: Payer: Self-pay | Admitting: Family Medicine

## 2014-08-12 ENCOUNTER — Other Ambulatory Visit: Payer: Self-pay | Admitting: Endocrinology

## 2014-08-17 DIAGNOSIS — Z0279 Encounter for issue of other medical certificate: Secondary | ICD-10-CM

## 2014-08-31 ENCOUNTER — Other Ambulatory Visit: Payer: Self-pay | Admitting: Family Medicine

## 2014-09-01 ENCOUNTER — Other Ambulatory Visit: Payer: Self-pay | Admitting: Family Medicine

## 2014-09-06 ENCOUNTER — Other Ambulatory Visit: Payer: Self-pay | Admitting: Family Medicine

## 2014-09-09 ENCOUNTER — Ambulatory Visit (INDEPENDENT_AMBULATORY_CARE_PROVIDER_SITE_OTHER): Payer: 59 | Admitting: Internal Medicine

## 2014-09-09 ENCOUNTER — Encounter: Payer: Self-pay | Admitting: Internal Medicine

## 2014-09-09 VITALS — BP 138/88 | HR 81 | Temp 98.6°F | Wt 167.0 lb

## 2014-09-09 DIAGNOSIS — J209 Acute bronchitis, unspecified: Secondary | ICD-10-CM | POA: Diagnosis not present

## 2014-09-09 MED ORDER — AZITHROMYCIN 250 MG PO TABS: ORAL_TABLET | ORAL | Status: DC

## 2014-09-09 MED ORDER — HYDROCODONE-HOMATROPINE 5-1.5 MG/5ML PO SYRP: 5.0000 mL | ORAL_SOLUTION | Freq: Three times a day (TID) | ORAL | Status: DC | PRN

## 2014-09-09 NOTE — Progress Notes (Signed)
HPI  Pt presents to the clinic today with c/o cough and body aches. This started 5 days ago. The cough was not productive until 1 day ago, now productive of thick green mucous. The cough is worse at night. She denies shortness of breath. She denies fever, chills or body aches. She has had some runny nose. She has tried Tylenol cold and flu, Robitussin DM, Flonase and Neti Pot. She does have a history of allergies but reports this feels different. She has had sick contacts. She does not smoke.  Review of Systems      Past Medical History  Diagnosis Date  . Hyperlipidemia   . Foot injury 08    Left foot/leg with ?? torn muscle -PROBLEM HAS RESOLVED  . Hand pain 1/09    steroid injections--RESOLVED  . Adenomatous colon polyp   . Internal hemorrhoids   . IBS (irritable bowel syndrome)   . Hypertension   . Hypothyroidism     pt states hx of thyroid nodules  . Sleep apnea     USES CPAP - SETTING IS 14  . Diabetes mellitus     type II--ORAL MEDICATION - NO INSULIN  . Hx of renal calculi     LEFT  . Diverticulitis of colon     Family History  Problem Relation Age of Onset  . Hyperlipidemia Mother   . Thyroid disease Daughter   . Diabetes Neg Hx     History   Social History  . Marital Status: Married    Spouse Name: N/A  . Number of Children: 3  . 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 Smoking status: Never Smoker   . Smokeless tobacco: Never Used  . Alcohol Use: No  . Drug Use: No  . Sexual Activity: Not on file   Other Topics Concern  . Not on file   Social History Narrative   Married, one son to daughters. She does office work. One caffeinated drink daily.   Updated as of 04/30/2013    Allergies  Allergen Reactions  . Crestor [Rosuvastatin]     REACTION: N/V and heartburn  . Lipitor [Atorvastatin]     REACTION: Hot flashes and flu-like symptoms  . Niacin     REACTION: n/v  .  Pravachol [Pravastatin Sodium]   . Pravastatin Sodium     REACTION: Neck swelling and pain in her shoulders and arms.  . Sulfonamide Derivatives     Rxn Unknown  . Zocor [Simvastatin]     REACTION: GI     Constitutional: Positive fatigue. Denies headache, fever or  abrupt weight changes.  HEENT:  Positive runny nose. Denies eye redness, eye pain, pressure behind the eyes, facial pain, nasal congestion, ear pain, ringing in the ears, wax buildup or sore throat. Respiratory: Positive cough. Denies difficulty breathing or shortness of breath.  Cardiovascular: Denies chest pain, chest tightness, palpitations or swelling in the hands or feet.   No other specific complaints in a complete review of systems (except as listed in HPI above).  Objective:   BP 138/88 mmHg  Pulse 81  Temp(Src) 98.6 F (37 C) (Oral)  Wt 167 lb (75.751 kg)  SpO2 98% Wt Readings from Last 3 Encounters:  09/09/14 167 lb (75.751 kg)  07/22/14 169 lb 9.6 oz (76.93 kg)  06/03/14 164 lb 8 oz (74.617 kg)     General: Appears  her stated age, ill appearing in NAD. HEENT: Head: normal shape and size, no sinus tenderness noted; Ears: cerumen impaction bilaterally; Nose: mucosa pink and moist, septum midline; Throat/Mouth:  Teeth present, mucosa erythematous and moist, no exudate noted, no lesions or ulcerations noted.  Neck: No lymphadenopathy.  Cardiovascular: Normal rate and rhythm. S1,S2 noted.  No murmur, rubs or gallops noted.  Pulmonary/Chest: Normal effort and coarse rhonchi in the RML, diminished in the RLL. No respiratory distress. No wheezes, rales  noted.      Assessment & Plan:   Acute Bronchitis:  Could even be early pneumonia Get some rest and drink plenty of water Tylenol/Ibuprofen for body aches eRx for Azithromax x 5 days Rx for Hycodan cough syrup  RTC as needed or if symptoms persist.

## 2014-09-09 NOTE — Progress Notes (Signed)
Pre visit review using our clinic review tool, if applicable. No additional management support is needed unless otherwise documented below in the visit note. 

## 2014-09-09 NOTE — Patient Instructions (Signed)
Cough, Adult  A cough is a reflex that helps clear your throat and airways. It can help heal the body or may be a reaction to an irritated airway. A cough may only last 2 or 3 weeks (acute) or may last more than 8 weeks (chronic).  CAUSES Acute cough:  Viral or bacterial infections. Chronic cough:  Infections.  Allergies.  Asthma.  Post-nasal drip.  Smoking.  Heartburn or acid reflux.  Some medicines.  Chronic lung problems (COPD).  Cancer. SYMPTOMS   Cough.  Fever.  Chest pain.  Increased breathing rate.  High-pitched whistling sound when breathing (wheezing).  Colored mucus that you cough up (sputum). TREATMENT   A bacterial cough may be treated with antibiotic medicine.  A viral cough must run its course and will not respond to antibiotics.  Your caregiver may recommend other treatments if you have a chronic cough. HOME CARE INSTRUCTIONS   Only take over-the-counter or prescription medicines for pain, discomfort, or fever as directed by your caregiver. Use cough suppressants only as directed by your caregiver.  Use a cold steam vaporizer or humidifier in your bedroom or home to help loosen secretions.  Sleep in a semi-upright position if your cough is worse at night.  Rest as needed.  Stop smoking if you smoke. SEEK IMMEDIATE MEDICAL CARE IF:   You have pus in your sputum.  Your cough starts to worsen.  You cannot control your cough with suppressants and are losing sleep.  You begin coughing up blood.  You have difficulty breathing.  You develop pain which is getting worse or is uncontrolled with medicine.  You have a fever. MAKE SURE YOU:   Understand these instructions.  Will watch your condition.  Will get help right away if you are not doing well or get worse. Document Released: 11/04/2010 Document Revised: 07/31/2011 Document Reviewed: 11/04/2010 [employer] Patient Information 2015 [employer]. This information is not intended  to replace advice given to you by your health care provider. Make sure you discuss any questions you have with your health care provider.  

## 2014-09-15 ENCOUNTER — Encounter: Payer: Self-pay | Admitting: Internal Medicine

## 2014-09-17 ENCOUNTER — Other Ambulatory Visit (INDEPENDENT_AMBULATORY_CARE_PROVIDER_SITE_OTHER): Payer: 59

## 2014-09-17 DIAGNOSIS — E038 Other specified hypothyroidism: Secondary | ICD-10-CM

## 2014-09-17 DIAGNOSIS — IMO0002 Reserved for concepts with insufficient information to code with codable children: Secondary | ICD-10-CM

## 2014-09-17 DIAGNOSIS — E780 Pure hypercholesterolemia: Secondary | ICD-10-CM

## 2014-09-17 DIAGNOSIS — E1165 Type 2 diabetes mellitus with hyperglycemia: Secondary | ICD-10-CM

## 2014-09-17 DIAGNOSIS — E063 Autoimmune thyroiditis: Secondary | ICD-10-CM

## 2014-09-17 DIAGNOSIS — E7801 Familial hypercholesterolemia: Secondary | ICD-10-CM

## 2014-09-17 LAB — LIPID PANEL
CHOL/HDL RATIO: 7
Cholesterol: 262 mg/dL — ABNORMAL HIGH (ref 0–200)
HDL: 40.2 mg/dL (ref >39.00)
NONHDL: 221.8
TRIGLYCERIDES: 234 mg/dL — AB (ref 0.0–149.0)
VLDL: 46.8 mg/dL — ABNORMAL HIGH (ref 0.0–40.0)

## 2014-09-17 LAB — TSH: TSH: 1.25 u[IU]/mL (ref 0.35–4.50)

## 2014-09-17 LAB — COMPREHENSIVE METABOLIC PANEL
ALBUMIN: 4.4 g/dL (ref 3.5–5.2)
ALT: 46 U/L — ABNORMAL HIGH (ref 0–35)
AST: 27 U/L (ref 0–37)
Alkaline Phosphatase: 68 U/L (ref 39–117)
BUN: 15 mg/dL (ref 6–23)
CO2: 27 meq/L (ref 19–32)
Calcium: 10.1 mg/dL (ref 8.4–10.5)
Chloride: 101 mEq/L (ref 96–112)
Creatinine, Ser: 0.64 mg/dL (ref 0.40–1.20)
GFR: 99.94 mL/min (ref >60.00)
Glucose, Bld: 134 mg/dL — ABNORMAL HIGH (ref 70–99)
POTASSIUM: 4.7 meq/L (ref 3.5–5.1)
SODIUM: 137 meq/L (ref 135–145)
TOTAL PROTEIN: 7.2 g/dL (ref 6.0–8.3)
Total Bilirubin: 0.5 mg/dL (ref 0.2–1.2)

## 2014-09-17 LAB — HEMOGLOBIN A1C: HEMOGLOBIN A1C: 6.9 % — AB (ref 4.6–6.5)

## 2014-09-17 LAB — LDL CHOLESTEROL, DIRECT: Direct LDL: 202 mg/dL

## 2014-09-22 ENCOUNTER — Encounter: Payer: Self-pay | Admitting: Endocrinology

## 2014-09-22 ENCOUNTER — Ambulatory Visit (INDEPENDENT_AMBULATORY_CARE_PROVIDER_SITE_OTHER): Payer: 59 | Admitting: Endocrinology

## 2014-09-22 VITALS — BP 118/74 | HR 96 | Temp 97.9°F | Resp 14 | Ht 63.0 in | Wt 169.6 lb

## 2014-09-22 DIAGNOSIS — E1165 Type 2 diabetes mellitus with hyperglycemia: Secondary | ICD-10-CM

## 2014-09-22 DIAGNOSIS — E119 Type 2 diabetes mellitus without complications: Secondary | ICD-10-CM

## 2014-09-22 DIAGNOSIS — E038 Other specified hypothyroidism: Secondary | ICD-10-CM

## 2014-09-22 DIAGNOSIS — E780 Pure hypercholesterolemia: Secondary | ICD-10-CM

## 2014-09-22 DIAGNOSIS — E063 Autoimmune thyroiditis: Secondary | ICD-10-CM

## 2014-09-22 DIAGNOSIS — E7801 Familial hypercholesterolemia: Secondary | ICD-10-CM

## 2014-09-22 NOTE — Progress Notes (Signed)
Patient ID: Joanna [name], female   DOB: 30-Dec-1950, 64 y.o.   MRN: [id number]           Reason for Appointment: F/u for Type 2 Diabetes  Referring physician: Deborra [name]  History of Present Illness:          Diagnosis: Type 2 diabetes mellitus, date of diagnosis: 2010?       Past history:  For several years prior to her diagnosis she had been complaining of her feet burning Her blood sugars were mildly increased initially at diagnosis and she did try to control it with diet alone and exercise without getting them back to normal.  Her A1c had continue to be around 7% with the lowest one 6.7 About 2012 she was started on metformin probably when her A1c was 7.3.   In 2014 she was doing very well with diet and exercise and losing weight Her sugars were fairly well controlled and A1c 6.9  Recent history:   In 2015 her blood sugars had been higher with A1c 8.1% On her initial consultation she was advised to start Tanzeum for multiple benefits including long-term control and weight ; started in late December 2015 She has better sugars with taking the 30 mg dose of Tanzeum weekly  She does not have any side effects with this although randomly has had occasional stomach cramps and one episode of nausea; however cramps may have been from taking metformin without eating On her last visit she was told to take her metformin twice a day since she was taking the metformin only once a day mostly.  Tolerating this well. She did not bring her blood sugar readings for review today on her monitor  Her A1c is significantly better at 6.9 but her weight has not come down Recently she has had respiratory infection and overall has been less active since her last visit Glucose patterns: She thinks her sugars are mostly a little high in the morning and also variably high after supper Has not found any time for exercising, was walking on treadmill previously and recently unable to do any because of acute respiratory  less       Oral hypoglycemic drugs the patient is taking are: metformin 1 g once a day      Side effects from medications have been: none Compliance with the medical regimen: fair    Glucose monitoring:  done once or twice a day         Glucometer: One Touch ultra mini,    Blood Glucose readings  By recall: am fasting readings 130-146 acs 115  Supper average about  160 with highest about 220  Self-care: The diet that the patient has been following is: tries to limit fat intake.     Meals: 3 meals per day. Breakfast is egg, toast, sometimes cereal Joanna [name].   Lunch is a sandwich or soup.  Snacks: Cottage cheese and fruit, no sweet drinks juices          Exercise: some walking, does have treadmill      Dietician visit, most recent: never.               Weight history: 165 -190   Wt Readings from Last 3 Encounters:  09/22/14 169 lb 9.6 oz (76.93 kg)  09/09/14 167 lb (75.751 kg)  07/22/14 169 lb 9.6 oz (76.93 kg)    Glycemic control:   Lab Results  Component Value Date   HGBA1C 6.9* 09/17/2014   HGBA1C 8.1*  02/12/2014   HGBA1C 7.8* 12/09/2013   Lab Results  Component Value Date   MICROALBUR 2.0* 07/16/2014   LDLCALC 209* 02/12/2014   CREATININE 0.64 09/17/2014        Medication List       This list is accurate as of: 09/22/14 12:10 PM.  Always use your most recent med list.               glucose blood test strip  Commonly known as:  ONE TOUCH ULTRA TEST  Use to check blood sugar daily Dx E11.9     HYDROcodone-homatropine 5-1.5 MG/5ML syrup  Commonly known as:  HYCODAN  Take 5 mLs by mouth every 8 (eight) hours as needed for cough.     levothyroxine 112 MCG tablet  Commonly known as:  SYNTHROID, LEVOTHROID  TAKE 1 TABLET (112 MCG TOTAL) BY MOUTH DAILY.     metFORMIN 1000 MG tablet  Commonly known as:  GLUCOPHAGE  TAKE 1 TABLET TWICE A DAY     ramipril 2.5 MG capsule  Commonly known as:  ALTACE  TAKE ONE CAPSULE BY MOUTH EVERY DAY     TANZEUM 30 MG Pen   Generic drug:  Albiglutide  INJECT ONCE A WEEK        Allergies:  Allergies  Allergen Reactions  . Crestor [Rosuvastatin]     REACTION: N/V and heartburn  . Lipitor [Atorvastatin]     REACTION: Hot flashes and flu-like symptoms  . Niacin     REACTION: n/v  . Pravachol [Pravastatin Sodium]   . Pravastatin Sodium     REACTION: Neck swelling and pain in her shoulders and arms.  . Sulfonamide Derivatives     Rxn Unknown  . Zocor [Simvastatin]     REACTION: GI    Past Medical History  Diagnosis Date  . Hyperlipidemia   . Foot injury 08    Left foot/leg with ?? torn muscle -PROBLEM HAS RESOLVED  . Hand pain 1/09    steroid injections--RESOLVED  . Adenomatous colon polyp   . Internal hemorrhoids   . IBS (irritable bowel syndrome)   . Hypertension   . Hypothyroidism     pt states hx of thyroid nodules  . Sleep apnea     USES CPAP - SETTING IS 14  . Diabetes mellitus     type II--ORAL MEDICATION - NO INSULIN  . Hx of renal calculi     LEFT  . Diverticulitis of colon     Past Surgical History  Procedure Laterality Date  . Colonoscopy w/ biopsies and polypectomy  05/19/2005    adenomatous polyps  . Tubal ligation    . Wisdom teeth extracted    . Nephrolithotomy Left 01/27/2013    Procedure: NEPHROLITHOTOMY PERCUTANEOUS;  Surgeon: Joanna Gray, MD;  Location: [facility];  Service: Urology;  Laterality: Left;    Family History  Problem Relation Age of Onset  . Hyperlipidemia Mother   . Thyroid disease Daughter   . Diabetes Neg Hx     Social History:  reports that she has never smoked. She has never used smokeless tobacco. She reports that she does not drink alcohol or use illicit drugs.     Review of Systems        Lipids: she has long-standing severe hypercholesterolemia and has been intolerant to all statin drugs with various reactions Crestor caused vomiting.  Lipitor caused flulike symptoms, abdominal bloating and hot flashes, pravastatin caused neck swelling and  upper body pain, Zocor caused  GI upset.   Currently not taking any medication She had been explained the need for management of her lipids with medications as she has familial hypercholesterolemia She is not taking Zetia currently Repatha was approved through her insurance but today she refuses to start taking this as she did not want to take medications      Lab Results  Component Value Date   CHOL 262* 09/17/2014   HDL 40.20 09/17/2014   LDLCALC 209* 02/12/2014   LDLDIRECT 202.0 09/17/2014   TRIG 234.0* 09/17/2014   CHOLHDL 7 09/17/2014               Thyroid:  No  unusual fatigue. She has had hypothyroidism for about 10 years and initially had a goiter also. Her last thyroid ultrasound showed only a 6 mm nodule which was stable Thyroid levels have been fairly normal in 2015, highest TSH probably about 9.2  Lab Results  Component Value Date   FREET4 1.03 02/12/2014   FREET4 1.04 12/09/2013   FREET4 0.90 09/26/2012   TSH 1.25 09/17/2014   TSH 1.33 02/12/2014   TSH 1.65 12/09/2013       The blood pressure has been  well controlled.  Only on ramipril 2.5 mg         No history of Numbness, tingling and recently has less burning in feet     LABS:  Lab on 09/17/2014  Component Date Value Ref Range Status  . Hgb A1c MFr Bld 09/17/2014 6.9* 4.6 - 6.5 % Final   Glycemic Control Guidelines for People with Diabetes:Non Diabetic:  <6%Goal of Therapy: <7%Additional Action Suggested:  >8%   . Sodium 09/17/2014 137  135 - 145 mEq/L Final  . Potassium 09/17/2014 4.7  3.5 - 5.1 mEq/L Final  . Chloride 09/17/2014 101  96 - 112 mEq/L Final  . CO2 09/17/2014 27  19 - 32 mEq/L Final  . Glucose, Bld 09/17/2014 134* 70 - 99 mg/dL Final  . BUN 09/17/2014 15  6 - 23 mg/dL Final  . Creatinine, Ser 09/17/2014 0.64  0.40 - 1.20 mg/dL Final  . Total Bilirubin 09/17/2014 0.5  0.2 - 1.2 mg/dL Final  . Alkaline Phosphatase 09/17/2014 68  39 - 117 U/L Final  . AST 09/17/2014 27  0 - 37 U/L Final    . ALT 09/17/2014 46* 0 - 35 U/L Final  . Total Protein 09/17/2014 7.2  6.0 - 8.3 g/dL Final  . Albumin 09/17/2014 4.4  3.5 - 5.2 g/dL Final  . Calcium 09/17/2014 10.1  8.4 - 10.5 mg/dL Final  . GFR 09/17/2014 99.94  >60.00 mL/min Final  . Cholesterol 09/17/2014 262* 0 - 200 mg/dL Final   ATP III Classification       Desirable:  < 200 mg/dL               Borderline High:  200 - 239 mg/dL          High:  > = 240 mg/dL  . Triglycerides 09/17/2014 234.0* 0.0 - 149.0 mg/dL Final   Normal:  <150 mg/dLBorderline High:  150 - 199 mg/dL  . HDL 09/17/2014 40.20  >39.00 mg/dL Final  . VLDL 09/17/2014 46.8* 0.0 - 40.0 mg/dL Final  . Total CHOL/HDL Ratio 09/17/2014 7   Final                  Men          Women1/2 Average Risk     3.4  3.3Average Risk          5.0          4.42X Average Risk          9.6          7.13X Average Risk          15.0          11.0                      . NonHDL 09/17/2014 221.80   Final   NOTE:  Non-HDL goal should be 30 mg/dL higher than patient's LDL goal (i.e. LDL goal of < 70 mg/dL, would have non-HDL goal of < 100 mg/dL)  . TSH 09/17/2014 1.25  0.35 - 4.50 uIU/mL Final  . Direct LDL 09/17/2014 202.0   Final   Optimal:  <100 mg/dLNear or Above Optimal:  100-129 mg/dLBorderline High:  130-159 mg/dLHigh:  160-189 mg/dLVery High:  >190 mg/dL    Physical Examination:  BP 118/74 mmHg  Pulse 96  Temp(Src) 97.9 F (36.6 C)  Resp 14  Ht 5\' 3"  (1.6 m)  Wt 169 lb 9.6 oz (76.93 kg)  BMI 30.05 kg/m2  SpO2 97%         ASSESSMENT/PLAN:   Diabetes type 2, with A1c 8.1% previously  See history of present illness for details of her current blood sugar patterns and management  Her blood sugars have improved with taking Tanzeum She is taking the 30 mg and has no difficulty doing the injections or any side effects A1c is now 6.9 However she thinks that occasionally blood sugars are over 200 after supper and still not near normal in the morning  She is refusing to  consider a higher dose of 50 mg Tanzeum She thinks she can try to start exercising when she feels better and will work on her weight also Advised her to bring her monitor for download on the next visit She will need to continue monitoring some readings after meals also and call if consistently high  Familial hypercholesterolemia with LDL over 200 despite taking Zetia.   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 Any transcriptional errors that result from this process are unintentional.

## 2014-09-25 ENCOUNTER — Other Ambulatory Visit: Payer: Self-pay | Admitting: Family Medicine

## 2014-10-02 LAB — HM DIABETES EYE EXAM

## 2014-10-08 ENCOUNTER — Encounter: Payer: Self-pay | Admitting: Family Medicine

## 2014-10-21 ENCOUNTER — Telehealth: Payer: Self-pay | Admitting: *Deleted

## 2014-10-21 NOTE — Telephone Encounter (Signed)
LMOM to return call about screening mammo.

## 2014-10-23 ENCOUNTER — Telehealth: Payer: Self-pay | Admitting: Family Medicine

## 2014-10-23 NOTE — Telephone Encounter (Signed)
Message left for patient to call me back and advise if she would like for me to document that she refuses test. Advised that we still recommend the test and I just need to know what she would like to do-refuse or move forward.

## 2014-10-23 NOTE — Telephone Encounter (Signed)
Patient returned Kim's call. Patient said she's 60 and she doesn't know if she needs to have a Mammogram done.  Patient said she does the self exams.  Patient said she's been having mammograms done since she was 21.  Patient said if they did find something, she wouldn't do the treatment because she knows several people who have had breast cancer and they went through the procedures and passed away. She has no family history.

## 2014-10-29 ENCOUNTER — Other Ambulatory Visit: Payer: Self-pay | Admitting: Family Medicine

## 2014-11-19 ENCOUNTER — Other Ambulatory Visit: Payer: Self-pay | Admitting: Family Medicine

## 2014-12-14 ENCOUNTER — Telehealth: Payer: Self-pay

## 2014-12-14 NOTE — Telephone Encounter (Signed)
Left a voicemail for patient to return my call, in regards to scheduling a Mammogram.  

## 2014-12-24 ENCOUNTER — Other Ambulatory Visit (INDEPENDENT_AMBULATORY_CARE_PROVIDER_SITE_OTHER): Payer: 59

## 2014-12-24 DIAGNOSIS — E1165 Type 2 diabetes mellitus with hyperglycemia: Secondary | ICD-10-CM

## 2014-12-24 DIAGNOSIS — E038 Other specified hypothyroidism: Secondary | ICD-10-CM | POA: Diagnosis not present

## 2014-12-24 DIAGNOSIS — E119 Type 2 diabetes mellitus without complications: Secondary | ICD-10-CM | POA: Diagnosis not present

## 2014-12-24 DIAGNOSIS — E7801 Familial hypercholesterolemia: Secondary | ICD-10-CM

## 2014-12-24 DIAGNOSIS — E063 Autoimmune thyroiditis: Secondary | ICD-10-CM

## 2014-12-24 LAB — LIPID PANEL
Cholesterol: 292 mg/dL — ABNORMAL HIGH (ref 0–200)
HDL: 41.5 mg/dL (ref >39.00)
NonHDL: 250.09
Total CHOL/HDL Ratio: 7
Triglycerides: 222 mg/dL — ABNORMAL HIGH (ref 0.0–149.0)
VLDL: 44.4 mg/dL — AB (ref 0.0–40.0)

## 2014-12-24 LAB — HEMOGLOBIN A1C: HEMOGLOBIN A1C: 7.1 % — AB (ref 4.6–6.5)

## 2014-12-24 LAB — TSH: TSH: 0.77 u[IU]/mL (ref 0.35–4.50)

## 2014-12-24 LAB — LDL CHOLESTEROL, DIRECT: Direct LDL: 237 mg/dL

## 2014-12-25 LAB — LIPOPROTEIN ANALYSIS BY NMR
HDL PARTICLE NUMBER: 28.6 umol/L — AB (ref >=30.5)
LDL PARTICLE NUMBER: 2958 nmol/L — AB (ref <1000)
LDL Size: 20.9 nm (ref >20.5)
LP-IR Score: 82 — ABNORMAL HIGH (ref <=45)
SMALL LDL PARTICLE NUMBER: 1641 nmol/L — AB (ref <=527)

## 2014-12-29 ENCOUNTER — Ambulatory Visit (INDEPENDENT_AMBULATORY_CARE_PROVIDER_SITE_OTHER): Payer: 59 | Admitting: Endocrinology

## 2014-12-29 ENCOUNTER — Encounter: Payer: Self-pay | Admitting: Endocrinology

## 2014-12-29 ENCOUNTER — Other Ambulatory Visit: Payer: Self-pay | Admitting: *Deleted

## 2014-12-29 VITALS — BP 128/82 | HR 81 | Temp 97.8°F | Resp 14 | Ht 63.0 in | Wt 173.0 lb

## 2014-12-29 DIAGNOSIS — E1165 Type 2 diabetes mellitus with hyperglycemia: Secondary | ICD-10-CM | POA: Diagnosis not present

## 2014-12-29 DIAGNOSIS — E780 Pure hypercholesterolemia: Secondary | ICD-10-CM | POA: Diagnosis not present

## 2014-12-29 DIAGNOSIS — E063 Autoimmune thyroiditis: Secondary | ICD-10-CM

## 2014-12-29 DIAGNOSIS — E038 Other specified hypothyroidism: Secondary | ICD-10-CM | POA: Diagnosis not present

## 2014-12-29 DIAGNOSIS — E7801 Familial hypercholesterolemia: Secondary | ICD-10-CM

## 2014-12-29 DIAGNOSIS — IMO0002 Reserved for concepts with insufficient information to code with codable children: Secondary | ICD-10-CM

## 2014-12-29 MED ORDER — ALBIGLUTIDE 50 MG ~~LOC~~ PEN: PEN_INJECTOR | SUBCUTANEOUS | Status: DC

## 2014-12-29 MED ORDER — LEVOTHYROXINE SODIUM 112 MCG PO TABS: ORAL_TABLET | ORAL | Status: DC

## 2014-12-29 MED ORDER — GLUCOSE BLOOD VI STRP: ORAL_STRIP | Status: DC

## 2014-12-29 MED ORDER — METFORMIN HCL 1000 MG PO TABS: 1000.0000 mg | ORAL_TABLET | Freq: Two times a day (BID) | ORAL | Status: DC

## 2014-12-29 MED ORDER — RAMIPRIL 2.5 MG PO CAPS: 2.5000 mg | ORAL_CAPSULE | Freq: Every day | ORAL | Status: DC

## 2014-12-29 NOTE — Patient Instructions (Addendum)
Check blood sugars on waking up .Marland Kitchen 2-3 .Marland Kitchen times a week Also check blood sugars about 2 hours after a meal and do this after different meals by rotation  Recommended blood sugar levels on waking up is 90-130 and about 2 hours after meal is 140-180 Please bring blood sugar monitor to each visit.  Walk daily   Tanzeum 50mg  weekly

## 2014-12-29 NOTE — Progress Notes (Signed)
Patient ID: Joanna [name], female   DOB: 20-Jul-1950, 64 y.o.   MRN: [id number]           Reason for Appointment: F/u for Type 2 Diabetes  Referring physician: Deborra [name]  History of Present Illness:          Diagnosis: Type 2 diabetes mellitus, date of diagnosis: 2010?       Recent history:  On her initial consultation she was advised to start Tanzeum for multiple benefits including long-term control and weight; was started in late December 2015 She does not have any side effects after continued usage Although her A1c had come down to 6.9 she has had difficulty losing weight On her last visit she had wanted to try harder on diet and start exercise to help better control rather than increase her Tanzeum She again did not bring her blood sugar readings for review today on her monitor  Glucose patterns and problems identified: She thinks her sugars are mostly significantly higher in the morning and somewhat high after meals also Has not found any time for exercising, was walking on treadmill previously and recently she thinks she is too busy to do any exercise and gets tired in the evenings She has not been watching her diet consistently and has gained weight She thinks she is checking her blood sugar twice a day but none in the last week and she ran out of test strips       Oral hypoglycemic drugs the patient is taking are: Metformin 1 g once a day      Side effects from medications have been: none Compliance with the medical regimen: fair    Glucose monitoring:  done once or twice a day         Glucometer: One Touch ultra mini,    Blood Glucose readings  By recall: am fasting readings up to 170; pc upto 200  Self-care: The diet that the patient has been following is: tries to limit fat intake.     Meals: 3 meals per day. Breakfast is egg, toast, sometimes cereal Joanna [name].   Lunch is a sandwich or soup.  Snacks: Cottage cheese and fruit, no sweet drinks juices          Exercise: a little  walking, does have treadmill      Dietician visit, most recent: never.               Weight history: Previously 165 -190   Wt Readings from Last 3 Encounters:  12/29/14 173 lb (78.472 kg)  09/22/14 169 lb 9.6 oz (76.93 kg)  09/09/14 167 lb (75.751 kg)    Glycemic control:   Lab Results  Component Value Date   HGBA1C 7.1* 12/24/2014   HGBA1C 6.9* 09/17/2014   HGBA1C 8.1* 02/12/2014   Lab Results  Component Value Date   MICROALBUR 2.0* 07/16/2014   LDLCALC 209* 02/12/2014   CREATININE 0.64 09/17/2014    Past diabetes history:  For several years prior to her diagnosis she had been complaining of her feet burning Her blood sugars were mildly increased initially at diagnosis and she did try to control it with diet alone and exercise without getting them back to normal.  Her A1c had continue to be around 7% with the lowest one 6.7 About 2012 she was started on metformin probably when her A1c was 7.3.   In 2014 she was doing very well with diet and exercise and losing weight.  Her sugars were fairly well  controlled and A1c 6.9 In 2015 her blood sugars had been higher with A1c 8.1%     Medication List       This list is accurate as of: 12/29/14  5:27 PM.  Always use your most recent med list.               Albiglutide 50 MG Pen  Commonly known as:  TANZEUM  Inject the contents of one pen once per week     glucose blood test strip  Commonly known as:  ONE TOUCH ULTRA TEST  Use to check blood sugar twice daily Dx E11.9     HYDROcodone-homatropine 5-1.5 MG/5ML syrup  Commonly known as:  HYCODAN  Take 5 mLs by mouth every 8 (eight) hours as needed for cough.     levothyroxine 112 MCG tablet  Commonly known as:  SYNTHROID, LEVOTHROID  TAKE 1 TABLET (112 MCG TOTAL) BY MOUTH DAILY.     metFORMIN 1000 MG tablet  Commonly known as:  GLUCOPHAGE  Take 1 tablet (1,000 mg total) by mouth 2 (two) times daily.     ramipril 2.5 MG capsule  Commonly known as:  ALTACE  Take 1  capsule (2.5 mg total) by mouth daily.        Allergies:  Allergies  Allergen Reactions  . Crestor [Rosuvastatin]     REACTION: N/V and heartburn  . Lipitor [Atorvastatin]     REACTION: Hot flashes and flu-like symptoms  . Niacin     REACTION: n/v  . Pravachol [Pravastatin Sodium]   . Pravastatin Sodium     REACTION: Neck swelling and pain in her shoulders and arms.  . Sulfonamide Derivatives     Rxn Unknown  . Zocor [Simvastatin]     REACTION: GI    Past Medical History  Diagnosis Date  . Hyperlipidemia   . Foot injury 08    Left foot/leg with ?? torn muscle -PROBLEM HAS RESOLVED  . Hand pain 1/09    steroid injections--RESOLVED  . Adenomatous colon polyp   . Internal hemorrhoids   . IBS (irritable bowel syndrome)   . Hypertension   . Hypothyroidism     pt states hx of thyroid nodules  . Sleep apnea     USES CPAP - SETTING IS 14  . Diabetes mellitus     type II--ORAL MEDICATION - NO INSULIN  . Hx of renal calculi     LEFT  . Diverticulitis of colon     Past Surgical History  Procedure Laterality Date  . Colonoscopy w/ biopsies and polypectomy  05/19/2005    adenomatous polyps  . Tubal ligation    . Wisdom teeth extracted    . Nephrolithotomy Left 01/27/2013    Procedure: NEPHROLITHOTOMY PERCUTANEOUS;  Surgeon: Joanna Gray, MD;  Location: [facility];  Service: Urology;  Laterality: Left;    Family History  Problem Relation Age of Onset  . Hyperlipidemia Mother   . Thyroid disease Daughter   . Diabetes Neg Hx     Social History:  reports that she has never smoked. She has never used smokeless tobacco. She reports that she does not drink alcohol or use illicit drugs.     Review of Systems        Lipids: she has long-standing severe hypercholesterolemia and has been intolerant to all statin drugs with various reactions Crestor caused vomiting.  Lipitor caused flulike symptoms, abdominal bloating and hot flashes, pravastatin caused neck swelling and upper body  pain, Zocor caused GI  upset.   Currently not taking any medication She had been explained the need for management of her lipids with medications as she has familial hypercholesterolemia She is not taking Zetia   Repatha was approved through her insurance but again today she refuses to start taking this as she is afraid of side effects despite explaining that this is different than any statin drug and the need to prevent cardiovascular disease Explained in detail the results of her lipoprotein profile with LDL particle number nearly 3 times the desired result      Lab Results  Component Value Date   CHOL 292* 12/24/2014   HDL 41.50 12/24/2014   LDLCALC 209* 02/12/2014   LDLDIRECT 237.0 12/24/2014   TRIG 222.0* 12/24/2014   CHOLHDL 7 12/24/2014               Thyroid:  No  unusual fatigue. She has had hypothyroidism for about 10 years and initially had a goiter also. Her last thyroid ultrasound showed only a 6 mm nodule which was stable Thyroid levels have been fairly normal in 2015, highest TSH probably about 9.2  Lab Results  Component Value Date   FREET4 1.03 02/12/2014   FREET4 1.04 12/09/2013   FREET4 0.90 09/26/2012   TSH 0.77 12/24/2014   TSH 1.25 09/17/2014   TSH 1.33 02/12/2014       The blood pressure has been  well controlled.  Only on ramipril 2.5 mg         No history of Numbness, tingling and recently has less burning in feet     LABS:  Appointment on 12/24/2014  Component Date Value Ref Range Status  . Hgb A1c MFr Bld 12/24/2014 7.1* 4.6 - 6.5 % Final   Glycemic Control Guidelines for People with Diabetes:Non Diabetic:  <6%Goal of Therapy: <7%Additional Action Suggested:  >8%   . Cholesterol 12/24/2014 292* 0 - 200 mg/dL Final   ATP III Classification       Desirable:  < 200 mg/dL               Borderline High:  200 - 239 mg/dL          High:  > = 240 mg/dL  . Triglycerides 12/24/2014 222.0* 0.0 - 149.0 mg/dL Final   Normal:  <150 mg/dLBorderline High:  150 -  199 mg/dL  . HDL 12/24/2014 41.50  >39.00 mg/dL Final  . VLDL 12/24/2014 44.4* 0.0 - 40.0 mg/dL Final  . Total CHOL/HDL Ratio 12/24/2014 7   Final                  Men          Women1/2 Average Risk     3.4          3.3Average Risk          5.0          4.42X Average Risk          9.6          7.13X Average Risk          15.0          11.0                      . NonHDL 12/24/2014 250.09   Final   NOTE:  Non-HDL goal should be 30 mg/dL higher than patient's LDL goal (i.e. LDL goal of < 70 mg/dL, would have non-HDL goal of < 100 mg/dL)  .  LDL Particle Number 12/24/2014 [id number]* <1000 nmol/L Final   Comment:                           Low                   < 1000                           Moderate         1000 - 1299                           Borderline-High  1300 - 1599                           High             1600 - 2000                           Very High             > 2000   . HDL Particle Number 12/24/2014 28.6* >=30.5 umol/L Final  . Small LDL Particle Number 12/24/2014 [id number]* <=527 nmol/L Final  . LDL Size 12/24/2014 20.9  >20.5 nm Final   Comment:  ----------------------------------------------------------                  ** INTERPRETATIVE INFORMATION**                  PARTICLE CONCENTRATION AND SIZE                     <--Lower CVD Risk   Higher CVD Risk-->   LDL AND HDL PARTICLES   Percentile in Reference Population   HDL-P (total)        High     75th    50th    25th   Low                        >34.9    34.9    30.5    26.7   <26.7   Small LDL-P          Low      25th    50th    75th   High                        <117     117     527     839    >839   LDL Size   <-Large (Pattern A)->    <-Small (Pattern B)->                     23.0    20.6           20.5      19.0  ---------------------------------------------------------- Small LDL-P and LDL Size are associated with CVD risk, but not after LDL-P is taken into account. These assays were developed and their performance  characteristics determined by [facility]. These assays have not been cleared by the [place] Food and Drug Administration. The clinical utility o  f these laboratory values have not been fully established.   Marland Kitchen LP-IR Score 12/24/2014 82* <=45 Final   Comment: INSULIN RESISTANCE MARKER     <--Insulin Sensitive    Insulin Resistant-->            Percentile in Reference Population Insulin Resistance Score LP-IR Score   Low   25th   50th   75th   High               <27   27     45     63     >63 LP-IR Score is inaccurate if patient is non-fasting. The LP-IR score is a laboratory developed index that has been associated with insulin resistance and diabetes risk and should be used as one component of a physician's clinical assessment. The LP-IR score listed above has not been cleared by the [place] Food and Drug Administration.   . TSH 12/24/2014 0.77  0.35 - 4.50 uIU/mL Final  . Direct LDL 12/24/2014 237.0   Final   Optimal:  <100 mg/dLNear or Above Optimal:  100-129 mg/dLBorderline High:  130-159 mg/dLHigh:  160-189 mg/dLVery High:  >190 mg/dL    Physical Examination:  BP 128/82 mmHg  Pulse 81  Temp(Src) 97.8 F (36.6 C)  Resp 14  Ht 5\' 3"  (1.6 m)  Wt 173 lb (78.472 kg)  BMI 30.65 kg/m2  SpO2 97%         ASSESSMENT/PLAN:   Diabetes type 2, with A1c 8.1% previously  See history of present illness for details of her current blood sugar patterns and management  Her blood sugars have started going up again and she has not been able to lose any weight She has difficulty complying with exercise and probably died also She says her blood sugars are between 150-200 and her A1c has increased also  She now agrees to go up to 50 mg on Tanzeum.  Also discussed possibility of switching to a more effective weight loss GLP drug such as Trulicity if this does not work improved with taking Tanzeum  She is agreeing to consider a higher dose of 50 mg Tanzeum She will try to  start exercising and join a health club Advised her to bring her monitor for download on the next visit She will need to continue monitoring some readings after meals also and call if consistently high  Familial hypercholesterolemia with LDL over 200 despite taking Zetia.   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 Any transcriptional errors that result from this process are unintentional.

## 2015-01-19 ENCOUNTER — Other Ambulatory Visit: Payer: Self-pay | Admitting: Family Medicine

## 2015-03-29 ENCOUNTER — Other Ambulatory Visit: Payer: 59

## 2015-04-01 ENCOUNTER — Ambulatory Visit: Payer: 59 | Admitting: Endocrinology

## 2015-04-13 ENCOUNTER — Other Ambulatory Visit: Payer: Self-pay | Admitting: Endocrinology

## 2015-05-03 ENCOUNTER — Encounter: Payer: Self-pay | Admitting: Family Medicine

## 2015-05-03 ENCOUNTER — Ambulatory Visit (INDEPENDENT_AMBULATORY_CARE_PROVIDER_SITE_OTHER): Payer: Commercial Managed Care - HMO | Admitting: Family Medicine

## 2015-05-03 VITALS — BP 148/86 | HR 86 | Temp 98.4°F | Wt 171.5 lb

## 2015-05-03 DIAGNOSIS — J069 Acute upper respiratory infection, unspecified: Secondary | ICD-10-CM

## 2015-05-03 MED ORDER — HYDROCOD POLST-CPM POLST ER 10-8 MG/5ML PO SUER: 5.0000 mL | Freq: Two times a day (BID) | ORAL | Status: DC | PRN

## 2015-05-03 MED ORDER — ALBUTEROL SULFATE HFA 108 (90 BASE) MCG/ACT IN AERS: 2.0000 | INHALATION_SPRAY | Freq: Four times a day (QID) | RESPIRATORY_TRACT | Status: DC | PRN

## 2015-05-03 MED ORDER — AZITHROMYCIN 250 MG PO TABS: ORAL_TABLET | ORAL | Status: DC

## 2015-05-03 NOTE — Progress Notes (Signed)
Pre visit review using our clinic review tool, if applicable. No additional management support is needed unless otherwise documented below in the visit note. 

## 2015-05-03 NOTE — Patient Instructions (Signed)
Take zpack as directed.  Drink lots of fluids.    Treat sympotmatically with Mucinex, nasal saline irrigation, and Tylenol/Ibuprofen.  Proair inhaler as needed for wheezing. Ok to continue flonase.  You can use warm compresses.  Cough suppressant at night.   Call if not improving as expected in 5-7 days.

## 2015-05-03 NOTE — Progress Notes (Signed)
SUBJECTIVE:  Joanna [name] is a 64 y.o. female who complains of coryza, congestion, productive cough, myalgias and wheezing for 8 days. She denies a history of chest pain and denies a history of asthma. Patient denies smoke cigarettes.   Current Outpatient Prescriptions on File Prior to Visit  Medication Sig Dispense Refill  . glucose blood (ONE TOUCH ULTRA TEST) test strip Use to check blood sugar twice daily Dx E11.9 100 each 3  . HYDROcodone-homatropine (HYCODAN) 5-1.5 MG/5ML syrup Take 5 mLs by mouth every 8 (eight) hours as needed for cough. 120 mL 0  . levothyroxine (SYNTHROID, LEVOTHROID) 112 MCG tablet TAKE 1 TABLET BY MOUTH ONCE A DAY 30 tablet 6  . metFORMIN (GLUCOPHAGE) 1000 MG tablet Take 1 tablet (1,000 mg total) by mouth 2 (two) times daily. 60 tablet 3  . ramipril (ALTACE) 2.5 MG capsule Take 1 capsule (2.5 mg total) by mouth daily. 30 capsule 3  . TANZEUM 50 MG PEN INJECT THE CONTENTS OF ONE PEN ONCE PER WEEK 4 each 1   No current facility-administered medications on file prior to visit.    Allergies  Allergen Reactions  . Crestor [Rosuvastatin]     REACTION: N/V and heartburn  . Lipitor [Atorvastatin]     REACTION: Hot flashes and flu-like symptoms  . Niacin     REACTION: n/v  . Pravachol [Pravastatin Sodium]   . Pravastatin Sodium     REACTION: Neck swelling and pain in her shoulders and arms.  . Sulfonamide Derivatives     Rxn Unknown  . Zocor [Simvastatin]     REACTION: GI    Past Medical History  Diagnosis Date  . Hyperlipidemia   . Foot injury 08    Left foot/leg with ?? torn muscle -PROBLEM HAS RESOLVED  . Hand pain 1/09    steroid injections--RESOLVED  . Adenomatous colon polyp   . Internal hemorrhoids   . IBS (irritable bowel syndrome)   . Hypertension   . Hypothyroidism     pt states hx of thyroid nodules  . Sleep apnea     USES CPAP - SETTING IS 14  . Diabetes mellitus     type II--ORAL MEDICATION - NO INSULIN  . Hx of renal calculi     LEFT   . Diverticulitis of colon     Past Surgical History  Procedure Laterality Date  . Colonoscopy w/ biopsies and polypectomy  05/19/2005    adenomatous polyps  . Tubal ligation    . Wisdom teeth extracted    . Nephrolithotomy Left 01/27/2013    Procedure: NEPHROLITHOTOMY PERCUTANEOUS;  Surgeon: Dutch Gray, MD;  Location: [facility];  Service: Urology;  Laterality: Left;    Family History  Problem Relation Age of Onset  . Hyperlipidemia Mother   . Thyroid disease Daughter   . Diabetes Neg Hx     Social History   Social History  . Marital Status: Married    Spouse Name: N/A  . Number of Children: 3  . 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 Smoking status: Never Smoker   . Smokeless tobacco: Never Used  . Alcohol Use: No  . Drug Use: No  . Sexual Activity: Not on file   Other Topics Concern  . Not on file   Social History Narrative   Married, one son to daughters. She does office work. One caffeinated drink  daily.   Updated as of 04/30/2013   The PMH, PSH, Social History, Family History, Medications, and allergies have been reviewed in [facility], and have been updated if relevant.  BP 148/86 mmHg  Pulse 86  Temp(Src) 98.4 F (36.9 C) (Oral)  Wt 171 lb 8 oz (77.792 kg)  SpO2 98% OBJECTIVE: She appears well, vital signs are as noted. Ears normal.  Throat and pharynx normal.  Neck supple. No adenopathy in the neck. Nose is congested. Sinuses non tender.  Harsh cough, wheezes and rhonchi throughout  ASSESSMENT:  bronchitis  PLAN: Zpack, proair and tussionex as needed. Symptomatic therapy suggested: push fluids, rest and return office visit prn if symptoms persist or worsen. Call or return to clinic prn if these symptoms worsen or fail to improve as anticipated.

## 2015-05-06 ENCOUNTER — Other Ambulatory Visit (INDEPENDENT_AMBULATORY_CARE_PROVIDER_SITE_OTHER): Payer: Commercial Managed Care - HMO

## 2015-05-06 ENCOUNTER — Ambulatory Visit: Payer: Commercial Managed Care - HMO | Admitting: Internal Medicine

## 2015-05-06 DIAGNOSIS — E1165 Type 2 diabetes mellitus with hyperglycemia: Secondary | ICD-10-CM | POA: Diagnosis not present

## 2015-05-06 DIAGNOSIS — E7801 Familial hypercholesterolemia: Secondary | ICD-10-CM | POA: Diagnosis not present

## 2015-05-06 DIAGNOSIS — IMO0002 Reserved for concepts with insufficient information to code with codable children: Secondary | ICD-10-CM

## 2015-05-06 DIAGNOSIS — E78019 Familial hypercholesterolemia, unspecified: Secondary | ICD-10-CM

## 2015-05-06 LAB — LDL CHOLESTEROL, DIRECT: LDL DIRECT: 196 mg/dL

## 2015-05-06 LAB — COMPREHENSIVE METABOLIC PANEL
ALBUMIN: 4.6 g/dL (ref 3.5–5.2)
ALT: 52 U/L — AB (ref 0–35)
AST: 32 U/L (ref 0–37)
Alkaline Phosphatase: 77 U/L (ref 39–117)
BILIRUBIN TOTAL: 0.4 mg/dL (ref 0.2–1.2)
BUN: 17 mg/dL (ref 6–23)
CO2: 32 mEq/L (ref 19–32)
CREATININE: 0.74 mg/dL (ref 0.40–1.20)
Calcium: 10.4 mg/dL (ref 8.4–10.5)
Chloride: 99 mEq/L (ref 96–112)
GFR: 84.35 mL/min (ref >60.00)
Glucose, Bld: 119 mg/dL — ABNORMAL HIGH (ref 70–99)
Potassium: 5.1 mEq/L (ref 3.5–5.1)
Sodium: 140 mEq/L (ref 135–145)
Total Protein: 7.8 g/dL (ref 6.0–8.3)

## 2015-05-06 LAB — LIPID PANEL
Cholesterol: 261 mg/dL — ABNORMAL HIGH (ref 0–200)
HDL: 31.9 mg/dL — ABNORMAL LOW (ref >39.00)
NONHDL: 228.98
TRIGLYCERIDES: 304 mg/dL — AB (ref 0.0–149.0)
Total CHOL/HDL Ratio: 8
VLDL: 60.8 mg/dL — ABNORMAL HIGH (ref 0.0–40.0)

## 2015-05-06 LAB — HEMOGLOBIN A1C: Hgb A1c MFr Bld: 6.9 % — ABNORMAL HIGH (ref 4.6–6.5)

## 2015-05-07 ENCOUNTER — Ambulatory Visit: Payer: 59 | Admitting: Endocrinology

## 2015-05-10 ENCOUNTER — Ambulatory Visit: Payer: 59 | Admitting: Endocrinology

## 2015-05-11 ENCOUNTER — Telehealth: Payer: Self-pay | Admitting: *Deleted

## 2015-05-11 ENCOUNTER — Ambulatory Visit (INDEPENDENT_AMBULATORY_CARE_PROVIDER_SITE_OTHER): Payer: Commercial Managed Care - HMO | Admitting: Endocrinology

## 2015-05-11 ENCOUNTER — Other Ambulatory Visit: Payer: Self-pay | Admitting: *Deleted

## 2015-05-11 ENCOUNTER — Encounter: Payer: Self-pay | Admitting: Endocrinology

## 2015-05-11 VITALS — BP 128/80 | HR 83 | Temp 97.7°F | Resp 14 | Ht 63.0 in | Wt 174.6 lb

## 2015-05-11 DIAGNOSIS — E7801 Familial hypercholesterolemia: Secondary | ICD-10-CM | POA: Diagnosis not present

## 2015-05-11 DIAGNOSIS — E1165 Type 2 diabetes mellitus with hyperglycemia: Secondary | ICD-10-CM

## 2015-05-11 DIAGNOSIS — E038 Other specified hypothyroidism: Secondary | ICD-10-CM | POA: Diagnosis not present

## 2015-05-11 DIAGNOSIS — E063 Autoimmune thyroiditis: Secondary | ICD-10-CM

## 2015-05-11 MED ORDER — EVOLOCUMAB 140 MG/ML ~~LOC~~ SOAJ: 140.0000 mg | SUBCUTANEOUS | Status: DC

## 2015-05-11 NOTE — Progress Notes (Signed)
Patient ID: Joanna [name], female   DOB: [birth year], 64 y.o.   MRN: [id number]           Reason for Appointment: F/u for Type 2 Diabetes  Referring physician: Deborra Medina  History of Present Illness:          Diagnosis: Type 2 diabetes mellitus, date of diagnosis: 2010?       Recent history:  On her initial consultation she was advised to start Tanzeum for multiple benefits including long-term control and weight; was started in late December 2015 She does not have any side effects with increasing the dose to  50 mg since 8/16   Although her A1c has come down to 6.9 she has had difficulty losing weight She again did not bring her blood sugar readings for review today on her monitor  Glucose patterns and problems identified:  She thinks her sugars are generally higher when she wakes up but later in the day they are better   Lab glucose was 119 late morning ; she may not always have protein at breakfast   however she thinks they are frequently over 200 after supper when checked 2 hours after eating.   Because of intercurrent illnesses including recent respiratory infections he has not been able to exercise ; also last month was having more stress with family issues   she did not bring her monitor because she did not have a battery         non-insulin hypoglycemic drugs the patient is taking are: Metformin 1 g once a day.  Tanzeum 50 milligrams weekly    Side effects from medications have been: none Compliance with the medical regimen: fair    Glucose monitoring:  done once or twice a day         Glucometer: One Touch ultra mini,    Blood Glucose readings  By recall:   Am 150-170 acs 120-130; pcs 170, rarely 270  Self-care: The diet that the patient has been following is: tries to limit fat intake.     Meals: 3 meals per day. Breakfast is egg, toast, sometimes cereal /oatmeal.  Occasionally will have fried food  Lunch is a sandwich or soup.  Snacks: Cottage cheese and fruit, no sweet  drinks juices          Exercise: at times walking, does have treadmill      Dietician visit, most recent: never.               Weight history: Previously 165 -190   Wt Readings from Last 3 Encounters:  05/11/15 174 lb 9.6 oz (79.198 kg)  05/03/15 171 lb 8 oz (77.792 kg)  12/29/14 173 lb (78.472 kg)    Glycemic control:   Lab Results  Component Value Date   HGBA1C 6.9* 05/06/2015   HGBA1C 7.1* 12/24/2014   HGBA1C 6.9* 09/17/2014   Lab Results  Component Value Date   MICROALBUR 2.0* 07/16/2014   LDLCALC 209* 02/12/2014   CREATININE 0.74 05/06/2015    Past diabetes history:  For several years prior to her diagnosis she had been complaining of her feet burning Her blood sugars were mildly increased initially at diagnosis and she did try to control it with diet alone and exercise without getting them back to normal.  Her A1c had continue to be around 7% with the lowest one 6.7 About 2012 she was started on metformin probably when her A1c was 7.3.   In 2014 she was doing very well  with diet and exercise and losing weight.  Her sugars were fairly well controlled and A1c 6.9 In 2015 her blood sugars had been higher with A1c 8.1%     Medication List       This list is accurate as of: 05/11/15 10:05 AM.  Always use your most recent med list.               albuterol 108 (90 BASE) MCG/ACT inhaler  Commonly known as:  PROVENTIL HFA;VENTOLIN HFA  Inhale 2 puffs into the lungs every 6 (six) hours as needed.     azithromycin 250 MG tablet  Commonly known as:  ZITHROMAX  2 tabs by mouth on day 1 followed by 1 tab by mouth daily days 2-5     chlorpheniramine-HYDROcodone 10-8 MG/5ML Suer  Commonly known as:  TUSSIONEX PENNKINETIC ER  Take 5 mLs by mouth every 12 (twelve) hours as needed.     glucose blood test strip  Commonly known as:  ONE TOUCH ULTRA TEST  Use to check blood sugar twice daily Dx E11.9     levothyroxine 112 MCG tablet  Commonly known as:  SYNTHROID,  LEVOTHROID  TAKE 1 TABLET BY MOUTH ONCE A DAY     metFORMIN 1000 MG tablet  Commonly known as:  GLUCOPHAGE  Take 1 tablet (1,000 mg total) by mouth 2 (two) times daily.     ramipril 2.5 MG capsule  Commonly known as:  ALTACE  Take 1 capsule (2.5 mg total) by mouth daily.     TANZEUM 50 MG Pen  Generic drug:  Albiglutide  INJECT THE CONTENTS OF ONE PEN ONCE PER WEEK        Allergies:  Allergies  Allergen Reactions  . Crestor [Rosuvastatin]     REACTION: N/V and heartburn  . Lipitor [Atorvastatin]     REACTION: Hot flashes and flu-like symptoms  . Niacin     REACTION: n/v  . Pravachol [Pravastatin Sodium]   . Pravastatin Sodium     REACTION: Neck swelling and pain in her shoulders and arms.  . Sulfonamide Derivatives     Rxn Unknown  . Zocor [Simvastatin]     REACTION: GI    Past Medical History  Diagnosis Date  . Hyperlipidemia   . Foot injury 08    Left foot/leg with ?? torn muscle -PROBLEM HAS RESOLVED  . Hand pain 1/09    steroid injections--RESOLVED  . Adenomatous colon polyp   . Internal hemorrhoids   . IBS (irritable bowel syndrome)   . Hypertension   . Hypothyroidism     pt states hx of thyroid nodules  . Sleep apnea     USES CPAP - SETTING IS 14  . Diabetes mellitus     type II--ORAL MEDICATION - NO INSULIN  . Hx of renal calculi     LEFT  . Diverticulitis of colon     Past Surgical History  Procedure Laterality Date  . Colonoscopy w/ biopsies and polypectomy  05/19/2005    adenomatous polyps  . Tubal ligation    . Wisdom teeth extracted    . Nephrolithotomy Left 01/27/2013    Procedure: NEPHROLITHOTOMY PERCUTANEOUS;  Surgeon: Dutch Gray, MD;  Location: [facility];  Service: Urology;  Laterality: Left;    Family History  Problem Relation Age of Onset  . Hyperlipidemia Mother   . Thyroid disease Daughter   . Diabetes Neg Hx     Social History:  reports that she has never smoked. She has  never used smokeless tobacco. She reports that she does  not drink alcohol or use illicit drugs.     Review of Systems        Lipids: she has long-standing severe hypercholesterolemia and has been intolerant to all statin drugs with various reactions Crestor caused vomiting.  Lipitor caused flulike symptoms, abdominal bloating and hot flashes, pravastatin caused neck swelling and upper body pain, Zocor caused GI upset.   Currently not taking any medication She had been explained the need for management of her lipids with medications as she has familial hypercholesterolemia She is not taking Zetia as she thinks it upset his stomach   Repatha was approved through her insurance previously This was discussed again in detail and showed her the options for the pen or the infusion device Although she was very reluctant to start discussed that she will not be able to improve her last significantly with diet or exercise and she does need a 50% reduction in her LDL Also previously her LDL particle number was nearly 3000  She prefers to use the twice a month pen injectio nand will try to get this through her preferred pharmacy       Lab Results  Component Value Date   CHOL 261* 05/06/2015   HDL 31.90* 05/06/2015   LDLCALC 209* 02/12/2014   LDLDIRECT 196.0 05/06/2015   TRIG 304.0* 05/06/2015   CHOLHDL 8 05/06/2015               Thyroid:  No  unusual fatigue. She has had hypothyroidism for about 10 years and initially had a goiter also. Her last thyroid ultrasound showed only a 6 mm nodule which was stable Thyroid levels have been fairly normal in 2015, highest TSH probably about 9.2 Last TSH normal in August  Lab Results  Component Value Date   FREET4 1.03 02/12/2014   FREET4 1.04 12/09/2013   FREET4 0.90 09/26/2012   TSH 0.77 12/24/2014   TSH 1.25 09/17/2014   TSH 1.33 02/12/2014       The blood pressure has been  well controlled.  Only on ramipril 2.5 mg         No history of Numbness, tingling and now has less burning in feet      LABS:  Lab on 05/06/2015  Component Date Value Ref Range Status  . Hgb A1c MFr Bld 05/06/2015 6.9* 4.6 - 6.5 % Final   Glycemic Control Guidelines for People with Diabetes:Non Diabetic:  <6%Goal of Therapy: <7%Additional Action Suggested:  >8%   . Sodium 05/06/2015 140  135 - 145 mEq/L Final  . Potassium 05/06/2015 5.1  3.5 - 5.1 mEq/L Final  . Chloride 05/06/2015 99  96 - 112 mEq/L Final  . CO2 05/06/2015 32  19 - 32 mEq/L Final  . Glucose, Bld 05/06/2015 119* 70 - 99 mg/dL Final  . BUN 05/06/2015 17  6 - 23 mg/dL Final  . Creatinine, Ser 05/06/2015 0.74  0.40 - 1.20 mg/dL Final  . Total Bilirubin 05/06/2015 0.4  0.2 - 1.2 mg/dL Final  . Alkaline Phosphatase 05/06/2015 77  39 - 117 U/L Final  . AST 05/06/2015 32  0 - 37 U/L Final  . ALT 05/06/2015 52* 0 - 35 U/L Final  . Total Protein 05/06/2015 7.8  6.0 - 8.3 g/dL Final  . Albumin 05/06/2015 4.6  3.5 - 5.2 g/dL Final  . Calcium 05/06/2015 10.4  8.4 - 10.5 mg/dL Final  . GFR 05/06/2015 84.35  >60.00 mL/min Final  .  Cholesterol 05/06/2015 261* 0 - 200 mg/dL Final   ATP III Classification       Desirable:  < 200 mg/dL               Borderline High:  200 - 239 mg/dL          High:  > = 240 mg/dL  . Triglycerides 05/06/2015 304.0* 0.0 - 149.0 mg/dL Final   Normal:  <150 mg/dLBorderline High:  150 - 199 mg/dL  . HDL 05/06/2015 31.90* >39.00 mg/dL Final  . VLDL 05/06/2015 60.8* 0.0 - 40.0 mg/dL Final  . Total CHOL/HDL Ratio 05/06/2015 8   Final                  Men          Women1/2 Average Risk     3.4          3.3Average Risk          5.0          4.42X Average Risk          9.6          7.13X Average Risk          15.0          11.0                      . NonHDL 05/06/2015 228.98   Final   NOTE:  Non-HDL goal should be 30 mg/dL higher than patient's LDL goal (i.e. LDL goal of < 70 mg/dL, would have non-HDL goal of < 100 mg/dL)  . Direct LDL 05/06/2015 196.0   Final   Optimal:  <100 mg/dLNear or Above Optimal:  100-129  mg/dLBorderline High:  130-159 mg/dLHigh:  160-189 mg/dLVery High:  >190 mg/dL    Physical Examination:  Ht 5\' 3"  (1.6 m)  Wt 174 lb 9.6 oz (79.198 kg)  BMI 30.94 kg/m2         ASSESSMENT/PLAN:   Diabetes type 2, with fairly good control  See history of present illness for details of her current blood sugar patterns and management Although her A1c is below 7 she is tending to have high readings after evening meal and fasting according to her recall.  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 she probably also has high postprandial readings after supper despite increasing her Tanzeum to 50 mg  Currently she is not exercising and also can do better on diet She thinks her sugars maybe higher because of stress and recent illness also Recommended trying Prandin before dinner time which she prefers to wait till the next visit  She will try to start exercising and join a health club Advised her to bring her monitor for download on the next visit She will need to continue monitoring some readings after meals also and call if consistently high  Familial hypercholesterolemia with LDL over 200  Patient reports intolerance to various drugs including Zetia As discussed above the patient finally agrees to starting treatment with Repatha 140 mg every 2 weeks, patient information booklet given and she was shown how to use the pen injector  History of hypothyroidism, 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 Any transcriptional errors that result from this process are unintentional.

## 2015-05-11 NOTE — Telephone Encounter (Signed)
error 

## 2015-05-20 ENCOUNTER — Telehealth: Payer: Self-pay | Admitting: Endocrinology

## 2015-05-20 NOTE — Telephone Encounter (Signed)
Patient stated that her prescription for cholesterol have not been called in to her pharmacy yet, and she need a new meter, she put in a new battery and still will not work. please avise

## 2015-05-21 ENCOUNTER — Other Ambulatory Visit: Payer: Self-pay | Admitting: *Deleted

## 2015-05-21 NOTE — Telephone Encounter (Signed)
I left a message for Joanna [name] to let her know that her Repatha has been approved, I need to know which pharmacy she wants it sent to. Message left for her to call back and let me know.

## 2015-05-21 NOTE — Telephone Encounter (Signed)
rx sent to a [facility]

## 2015-05-21 NOTE — Telephone Encounter (Signed)
She needs to go to a specialty pharmacy like [facility]

## 2015-05-22 ENCOUNTER — Other Ambulatory Visit: Payer: Self-pay | Admitting: Endocrinology

## 2015-06-17 ENCOUNTER — Other Ambulatory Visit: Payer: Self-pay | Admitting: Endocrinology

## 2015-07-15 ENCOUNTER — Telehealth: Payer: Self-pay | Admitting: Endocrinology

## 2015-07-15 NOTE — Telephone Encounter (Signed)
I reviews chart and do not see that meter needed to sent in. Ok to send in meter, and if so, what meter would you like me to send in for her?

## 2015-07-15 NOTE — Telephone Encounter (Signed)
Pt asking about why the meter was never called in in December. Please call in the meter script [facility] local [facility]. Thank you

## 2015-07-15 NOTE — Telephone Encounter (Signed)
She was using the One Touch ultra mini, would prefer to use the One Touch ultra 2 if okay with her  Also ask her if she started taking the Repatha injections for cholesterol that we prescribed on the last visit

## 2015-07-15 NOTE — Telephone Encounter (Signed)
Left message for patient to return phone call.  

## 2015-08-05 ENCOUNTER — Other Ambulatory Visit: Payer: Commercial Managed Care - HMO

## 2015-08-09 ENCOUNTER — Ambulatory Visit: Payer: Commercial Managed Care - HMO | Admitting: Endocrinology

## 2015-08-21 ENCOUNTER — Other Ambulatory Visit: Payer: Self-pay | Admitting: Family Medicine

## 2015-09-13 ENCOUNTER — Telehealth: Payer: Self-pay | Admitting: Endocrinology

## 2015-09-13 NOTE — Telephone Encounter (Signed)
Fine to move appointments and prescribed medication until then

## 2015-09-13 NOTE — Telephone Encounter (Signed)
Pt has a broken leg and due to driving restrictions and transportation issues can we move her appts to after she gets her cast off at the middle of May and if so can we just call in a bridge rx until that appt

## 2015-09-13 NOTE — Telephone Encounter (Signed)
Please see below and advise, last appointment was 05/11/2015

## 2015-09-14 ENCOUNTER — Other Ambulatory Visit: Payer: Self-pay | Admitting: *Deleted

## 2015-09-14 MED ORDER — ALBIGLUTIDE 50 MG ~~LOC~~ PEN: PEN_INJECTOR | SUBCUTANEOUS | Status: DC

## 2015-09-14 MED ORDER — RAMIPRIL 2.5 MG PO CAPS: ORAL_CAPSULE | ORAL | Status: DC

## 2015-09-14 MED ORDER — METFORMIN HCL 1000 MG PO TABS: ORAL_TABLET | ORAL | Status: DC

## 2015-09-14 MED ORDER — EVOLOCUMAB 140 MG/ML ~~LOC~~ SOAJ: 140.0000 mg | SUBCUTANEOUS | Status: DC

## 2015-09-14 NOTE — Telephone Encounter (Signed)
Noted, rx's have been sent. 

## 2015-09-20 ENCOUNTER — Other Ambulatory Visit: Payer: Commercial Managed Care - HMO

## 2015-09-23 ENCOUNTER — Ambulatory Visit: Payer: Commercial Managed Care - HMO | Admitting: Endocrinology

## 2015-10-26 ENCOUNTER — Other Ambulatory Visit (INDEPENDENT_AMBULATORY_CARE_PROVIDER_SITE_OTHER): Payer: Commercial Managed Care - HMO

## 2015-10-26 DIAGNOSIS — E1165 Type 2 diabetes mellitus with hyperglycemia: Secondary | ICD-10-CM | POA: Diagnosis not present

## 2015-10-26 DIAGNOSIS — E063 Autoimmune thyroiditis: Secondary | ICD-10-CM

## 2015-10-26 DIAGNOSIS — E038 Other specified hypothyroidism: Secondary | ICD-10-CM

## 2015-10-26 DIAGNOSIS — E7801 Familial hypercholesterolemia: Secondary | ICD-10-CM

## 2015-10-26 LAB — COMPREHENSIVE METABOLIC PANEL
ALBUMIN: 4.6 g/dL (ref 3.5–5.2)
ALK PHOS: 72 U/L (ref 39–117)
ALT: 52 U/L — AB (ref 0–35)
AST: 29 U/L (ref 0–37)
BILIRUBIN TOTAL: 0.6 mg/dL (ref 0.2–1.2)
BUN: 15 mg/dL (ref 6–23)
CO2: 28 mEq/L (ref 19–32)
CREATININE: 0.63 mg/dL (ref 0.40–1.20)
Calcium: 9.9 mg/dL (ref 8.4–10.5)
Chloride: 100 mEq/L (ref 96–112)
GFR: 101.41 mL/min (ref >60.00)
GLUCOSE: 140 mg/dL — AB (ref 70–99)
Potassium: 4.5 mEq/L (ref 3.5–5.1)
SODIUM: 135 meq/L (ref 135–145)
TOTAL PROTEIN: 7.4 g/dL (ref 6.0–8.3)

## 2015-10-26 LAB — LDL CHOLESTEROL, DIRECT: LDL DIRECT: 216 mg/dL

## 2015-10-26 LAB — TSH: TSH: 2.75 u[IU]/mL (ref 0.35–4.50)

## 2015-10-26 LAB — LIPID PANEL
Cholesterol: 272 mg/dL — ABNORMAL HIGH (ref 0–200)
HDL: 37.6 mg/dL — AB (ref >39.00)
NONHDL: 234.26
Total CHOL/HDL Ratio: 7
Triglycerides: 221 mg/dL — ABNORMAL HIGH (ref 0.0–149.0)
VLDL: 44.2 mg/dL — ABNORMAL HIGH (ref 0.0–40.0)

## 2015-10-26 LAB — HEMOGLOBIN A1C: HEMOGLOBIN A1C: 7.1 % — AB (ref 4.6–6.5)

## 2015-10-28 ENCOUNTER — Ambulatory Visit (INDEPENDENT_AMBULATORY_CARE_PROVIDER_SITE_OTHER): Payer: Commercial Managed Care - HMO | Admitting: Endocrinology

## 2015-10-28 VITALS — BP 149/86 | HR 69 | Temp 97.9°F | Resp 26 | Ht 63.0 in | Wt 173.4 lb

## 2015-10-28 DIAGNOSIS — E1165 Type 2 diabetes mellitus with hyperglycemia: Secondary | ICD-10-CM

## 2015-10-28 DIAGNOSIS — E7801 Familial hypercholesterolemia: Secondary | ICD-10-CM

## 2015-10-28 DIAGNOSIS — E78019 Familial hypercholesterolemia, unspecified: Secondary | ICD-10-CM

## 2015-10-28 MED ORDER — EVOLOCUMAB 140 MG/ML ~~LOC~~ SOAJ: 140.0000 mg | SUBCUTANEOUS | Status: DC

## 2015-10-28 NOTE — Patient Instructions (Signed)
Take both Metformin in pm  Check blood sugars on waking up   times a week Also check blood sugars about 2 hours after a meal and do this after different meals by rotation  Recommended blood sugar levels on waking up is 90-130 and about 2 hours after meal is 130-160  Please bring your blood sugar monitor to each visit, thank you

## 2015-10-28 NOTE — Progress Notes (Signed)
Pre visit review using our clinic review tool, if applicable. No additional management support is needed unless otherwise documented below in the visit note. 

## 2015-10-28 NOTE — Progress Notes (Signed)
Patient ID: Joanna [name], female   DOB: 11-02-1950, 65 y.o.   MRN: [id number]           Reason for Appointment: F/u for Type 2 Diabetes  Referring physician: Deborra Medina  History of Present Illness:          Diagnosis: Type 2 diabetes mellitus, date of diagnosis: 2010?       Recent history:  On her initial consultation she was advised to start Tanzeum for multiple benefits including long-term control; was started in 12/15.  She is also on metformin  She has not been seen in follow-up since 12/16 She again did not bring her blood sugar readings for review today on her monitor A1c is slightly higher at 7.1, previously 6.9        Non-insulin hypoglycemic drugs the patient is taking are: Metformin 1 g once a day.  Tanzeum 50 mg weekly  Glucose patterns and problems identified:  She thinks her sugars are higher than before and the morning on waking up  She believes blood sugars are better later in the day and not significantly high after evening meal also  Prior to her ankle fracture she had been trying to walk regularly for exercise and has not gained any weight since her last visit   generally higher when she wakes up but later in the day they are better   Lab glucose was 140 late morning   Although she thinks she is trying to eat a healthy diet she has not been following any particular meal plan and has not had any consultation from dietitian  She is compliant with taking her Tanzeum weekly    Side effects from medications have been: none Compliance with the medical regimen: fair    Glucose monitoring:  done once or twice a day         Glucometer: One Touch ultra mini,    Blood Glucose readings  By recall:    PRE-MEAL Fasting Lunch Dinner Bedtime Overall  Glucose range: 180-240  130 <160   Mean/median:        Self-care: The diet that the patient has been following is: tries to limit fat intake.     Meals: 3 meals per day. Breakfast is egg, toast, sometimes cereal /oatmeal.   Occasionally will have fried food  Lunch is a sandwich or soup.  Snacks: Cottage cheese and fruit, no sweet drinks juices          Exercise: Minimal recently except upper body exercises    Dietician visit, most recent: never.               Weight history: Previously range was 165 -190   Wt Readings from Last 3 Encounters:  10/28/15 173 lb 6 oz (78.642 kg)  05/11/15 174 lb 9.6 oz (79.198 kg)  05/03/15 171 lb 8 oz (77.792 kg)    Glycemic control:   Lab Results  Component Value Date   HGBA1C 7.1* 10/26/2015   HGBA1C 6.9* 05/06/2015   HGBA1C 7.1* 12/24/2014   Lab Results  Component Value Date   MICROALBUR 2.0* 07/16/2014   LDLCALC 209* 02/12/2014   CREATININE 0.63 10/26/2015    Past diabetes history:  For several years prior to her diagnosis she had been complaining of her feet burning Her blood sugars were mildly increased initially at diagnosis and she did try to control it with diet alone and exercise without getting them back to normal.  Her A1c had continue to be around 7%  with the lowest one 6.7  About 2012 she was started on metformin probably when her A1c was 7.3.   In 2015 her blood sugars had been higher with A1c 8.1%     Medication List       This list is accurate as of: 10/28/15  8:49 PM.  Always use your most recent med list.               Albiglutide 50 MG Pen  Commonly known as:  TANZEUM  INJECT THE CONTENTS OF ONE PEN ONCE PER WEEK     albuterol 108 (90 Base) MCG/ACT inhaler  Commonly known as:  PROVENTIL HFA;VENTOLIN HFA  Inhale 2 puffs into the lungs every 6 (six) hours as needed.     chlorpheniramine-HYDROcodone 10-8 MG/5ML Suer  Commonly known as:  TUSSIONEX PENNKINETIC ER  Take 5 mLs by mouth every 12 (twelve) hours as needed.     Evolocumab 140 MG/ML Soaj  Commonly known as:  REPATHA SURECLICK  Inject [id number] mg into the skin every 14 (fourteen) days.     glucose blood test strip  Commonly known as:  ONE TOUCH ULTRA TEST  Use to check blood  sugar twice daily Dx E11.9     levothyroxine 112 MCG tablet  Commonly known as:  SYNTHROID, LEVOTHROID  TAKE 1 TABLET BY MOUTH ONCE A DAY     metFORMIN 1000 MG tablet  Commonly known as:  GLUCOPHAGE  TAKE 1 TABLET (1,000 MG TOTAL) BY MOUTH 2 (TWO) TIMES DAILY.     ramipril 2.5 MG capsule  Commonly known as:  ALTACE  TAKE 1 CAPSULE (2.5 MG TOTAL) BY MOUTH DAILY.        Allergies:  Allergies  Allergen Reactions  . Crestor [Rosuvastatin]     REACTION: N/V and heartburn  . Lipitor [Atorvastatin]     REACTION: Hot flashes and flu-like symptoms  . Niacin     REACTION: n/v  . Pravachol [Pravastatin Sodium]   . Pravastatin Sodium     REACTION: Neck swelling and pain in her shoulders and arms.  . Sulfonamide Derivatives     Rxn Unknown  . Zocor [Simvastatin]     REACTION: GI    Past Medical History  Diagnosis Date  . Hyperlipidemia   . Foot injury 08    Left foot/leg with ?? torn muscle -PROBLEM HAS RESOLVED  . Hand pain 1/09    steroid injections--RESOLVED  . Adenomatous colon polyp   . Internal hemorrhoids   . IBS (irritable bowel syndrome)   . Hypertension   . Hypothyroidism     pt states hx of thyroid nodules  . Sleep apnea     USES CPAP - SETTING IS 14  . Diabetes mellitus     type II--ORAL MEDICATION - NO INSULIN  . Hx of renal calculi     LEFT  . Diverticulitis of colon     Past Surgical History  Procedure Laterality Date  . Colonoscopy w/ biopsies and polypectomy  05/19/2005    adenomatous polyps  . Tubal ligation    . Wisdom teeth extracted    . Nephrolithotomy Left 01/27/2013    Procedure: NEPHROLITHOTOMY PERCUTANEOUS;  Surgeon: Dutch Gray, MD;  Location: [facility];  Service: Urology;  Laterality: Left;    Family History  Problem Relation Age of Onset  . Hyperlipidemia Mother   . Thyroid disease Daughter   . Diabetes Neg Hx     Social History:  reports that she has never smoked. She  has never used smokeless tobacco. She reports that she does not  drink alcohol or use illicit drugs.     Review of Systems        Lipids: she has long-standing severe hypercholesterolemia and has been intolerant to all statin drugs with various reactions Crestor caused vomiting.  Lipitor caused flulike symptoms, abdominal bloating and hot flashes, pravastatin caused neck swelling and upper body pain, Zocor caused GI upset.   Currently not taking any medication She had been explained the need for management of her lipids with medications as she has familial hypercholesterolemia She is not taking Zetia as she thinks it upset his stomach   Repatha was approved through her insurance  Prescription has been sent a couple of times but she has not picked them up She is still again reluctant to try medications for fear of side effects  The treatment and risks for cardiovascular events with hyperlipidemia and diabetes were discussed again in detail and reassured her about the differences between [place] and statin drugs that she had not tolerated before Discussed lipid levels and fact that she does need over 50% reduction in her LDL  Also previously her LDL particle number was nearly 3000  She finally agrees to try the medication and will [facility]      Lab Results  Component Value Date   CHOL 272* 10/26/2015   HDL 37.60* 10/26/2015   LDLCALC 209* 02/12/2014   LDLDIRECT 216.0 10/26/2015   TRIG 221.0* 10/26/2015   CHOLHDL 7 10/26/2015               Thyroid:    She has had hypothyroidism for about 10 years and initially had a goiter also; highest TSH probably about 9.2 Her last thyroid ultrasound showed only a 6 mm nodule which was stable Thyroid levels have been fairly normal and consistent  Last TSH normal in August  Lab Results  Component Value Date   TSH 2.75 10/26/2015   TSH 0.77 12/24/2014   TSH 1.25 09/17/2014   FREET4 1.03 02/12/2014   FREET4 1.04 12/09/2013   FREET4 0.90 09/26/2012        The blood pressure  appears high today, has not followed up with PCP   BP Readings from Last 3 Encounters:  10/28/15 149/86  05/11/15 128/80  05/03/15 148/86       Only on ramipril 2.5 mg        LABS:  Lab on 10/26/2015  Component Date Value Ref Range Status  . Sodium 10/26/2015 135  135 - 145 mEq/L Final  . Potassium 10/26/2015 4.5  3.5 - 5.1 mEq/L Final  . Chloride 10/26/2015 100  96 - 112 mEq/L Final  . CO2 10/26/2015 28  19 - 32 mEq/L Final  . Glucose, Bld 10/26/2015 140* 70 - 99 mg/dL Final  . BUN 10/26/2015 15  6 - 23 mg/dL Final  . Creatinine, Ser 10/26/2015 0.63  0.40 - 1.20 mg/dL Final  . Total Bilirubin 10/26/2015 0.6  0.2 - 1.2 mg/dL Final  . Alkaline Phosphatase 10/26/2015 72  39 - 117 U/L Final  . AST 10/26/2015 29  0 - 37 U/L Final  . ALT 10/26/2015 52* 0 - 35 U/L Final  . Total Protein 10/26/2015 7.4  6.0 - 8.3 g/dL Final  . Albumin 10/26/2015 4.6  3.5 - 5.2 g/dL Final  . Calcium 10/26/2015 9.9  8.4 - 10.5 mg/dL Final  . GFR 10/26/2015 101.41  >60.00 mL/min Final  .  Hgb A1c MFr Bld 10/26/2015 7.1* 4.6 - 6.5 % Final   Glycemic Control Guidelines for People with Diabetes:Non Diabetic:  <6%Goal of Therapy: <7%Additional Action Suggested:  >8%   . Cholesterol 10/26/2015 272* 0 - 200 mg/dL Final   ATP III Classification       Desirable:  < 200 mg/dL               Borderline High:  200 - 239 mg/dL          High:  > = 240 mg/dL  . Triglycerides 10/26/2015 221.0* 0.0 - 149.0 mg/dL Final   Normal:  <150 mg/dLBorderline High:  150 - 199 mg/dL  . HDL 10/26/2015 37.60* >39.00 mg/dL Final  . VLDL 10/26/2015 44.2* 0.0 - 40.0 mg/dL Final  . Total CHOL/HDL Ratio 10/26/2015 7   Final                  Men          Women1/2 Average Risk     3.4          3.3Average Risk          5.0          4.42X Average Risk          9.6          7.13X Average Risk          15.0          11.0                      . NonHDL 10/26/2015 234.26   Final   NOTE:  Non-HDL goal should be 30 mg/dL higher than patient's LDL  goal (i.e. LDL goal of < 70 mg/dL, would have non-HDL goal of < 100 mg/dL)  . TSH 10/26/2015 2.75  0.35 - 4.50 uIU/mL Final  . Direct LDL 10/26/2015 216.0   Final   Optimal:  <100 mg/dLNear or Above Optimal:  100-129 mg/dLBorderline High:  130-159 mg/dLHigh:  160-189 mg/dLVery High:  >190 mg/dL    Physical Examination:  BP 149/86 mmHg  Pulse 69  Temp(Src) 97.9 F (36.6 C) (Oral)  Resp 26  Ht 5\' 3"  (1.6 m)  Wt 173 lb 6 oz (78.642 kg)  BMI 30.72 kg/m2         ASSESSMENT/PLAN:   Diabetes type 2, with fairly good control  See history of present illness for details of her current blood sugar patterns and management Although her A1c is fairly stable at 7.1 she appears to be having significantly high fasting readings by recall  Again she did not bring her monitor and not clear what her exact blood sugar patterns are  Currently she is not exercising and may be able to do so once she recovers from her fracture completely May also benefit from consultation with dietitian  For now she will try to take 1 tablet metformin at dinner and another at bedtime instead of breakfast and suppertime  Familial hypercholesterolemia with LDL over 200  Patient reports intolerance to various drugs including Zetia She is going to start Repatha 140 mg every 2 weeks Recommended follow-up in 2 months but she wants to wait 3 months  History of hypothyroidism, 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 Any transcriptional errors that result from this process are unintentional.

## 2016-01-02 ENCOUNTER — Other Ambulatory Visit: Payer: Self-pay | Admitting: Family Medicine

## 2016-01-02 ENCOUNTER — Other Ambulatory Visit: Payer: Self-pay | Admitting: Endocrinology

## 2016-01-05 ENCOUNTER — Other Ambulatory Visit: Payer: Self-pay | Admitting: Family Medicine

## 2016-01-20 ENCOUNTER — Other Ambulatory Visit: Payer: Commercial Managed Care - HMO

## 2016-01-27 ENCOUNTER — Ambulatory Visit: Payer: Commercial Managed Care - HMO | Admitting: Endocrinology

## 2016-01-27 ENCOUNTER — Other Ambulatory Visit: Payer: Self-pay | Admitting: Endocrinology

## 2016-02-02 ENCOUNTER — Other Ambulatory Visit: Payer: Self-pay | Admitting: Endocrinology

## 2016-03-07 ENCOUNTER — Other Ambulatory Visit: Payer: Self-pay | Admitting: Endocrinology

## 2016-04-03 ENCOUNTER — Other Ambulatory Visit: Payer: Self-pay | Admitting: Endocrinology

## 2016-04-07 ENCOUNTER — Other Ambulatory Visit: Payer: Self-pay | Admitting: Endocrinology

## 2016-04-10 ENCOUNTER — Telehealth: Payer: Self-pay | Admitting: Endocrinology

## 2016-04-10 ENCOUNTER — Other Ambulatory Visit: Payer: Self-pay

## 2016-04-10 MED ORDER — RAMIPRIL 2.5 MG PO CAPS: ORAL_CAPSULE | ORAL | 0 refills | Status: DC

## 2016-04-10 NOTE — Telephone Encounter (Signed)
Done

## 2016-04-10 NOTE — Telephone Encounter (Signed)
Pt needs her Ramipril refill sent to the [facility].  She will be out before her visit with Dr. Dwyane Dee.

## 2016-04-10 NOTE — Telephone Encounter (Signed)
Okay to refill, she has scheduled appointment

## 2016-04-10 NOTE — Telephone Encounter (Signed)
OK to refill? She cancelled the last 2 appts.

## 2016-04-20 ENCOUNTER — Other Ambulatory Visit (INDEPENDENT_AMBULATORY_CARE_PROVIDER_SITE_OTHER): Payer: Commercial Managed Care - HMO

## 2016-04-20 ENCOUNTER — Other Ambulatory Visit: Payer: Self-pay | Admitting: Endocrinology

## 2016-04-20 DIAGNOSIS — E038 Other specified hypothyroidism: Secondary | ICD-10-CM

## 2016-04-20 DIAGNOSIS — E1165 Type 2 diabetes mellitus with hyperglycemia: Secondary | ICD-10-CM | POA: Diagnosis not present

## 2016-04-20 DIAGNOSIS — E7801 Familial hypercholesterolemia: Secondary | ICD-10-CM

## 2016-04-20 DIAGNOSIS — E063 Autoimmune thyroiditis: Secondary | ICD-10-CM

## 2016-04-20 LAB — LIPID PANEL
CHOL/HDL RATIO: 7
Cholesterol: 278 mg/dL — ABNORMAL HIGH (ref 0–200)
HDL: 40.3 mg/dL (ref >39.00)
NONHDL: 237.76
TRIGLYCERIDES: 244 mg/dL — AB (ref 0.0–149.0)
VLDL: 48.8 mg/dL — AB (ref 0.0–40.0)

## 2016-04-20 LAB — COMPREHENSIVE METABOLIC PANEL
ALBUMIN: 4.5 g/dL (ref 3.5–5.2)
ALK PHOS: 67 U/L (ref 39–117)
ALT: 68 U/L — ABNORMAL HIGH (ref 0–35)
AST: 38 U/L — AB (ref 0–37)
BUN: 17 mg/dL (ref 6–23)
CHLORIDE: 101 meq/L (ref 96–112)
CO2: 28 mEq/L (ref 19–32)
CREATININE: 0.66 mg/dL (ref 0.40–1.20)
Calcium: 9.8 mg/dL (ref 8.4–10.5)
GFR: 95.96 mL/min (ref >60.00)
GLUCOSE: 146 mg/dL — AB (ref 70–99)
Potassium: 4.5 mEq/L (ref 3.5–5.1)
SODIUM: 137 meq/L (ref 135–145)
TOTAL PROTEIN: 7.3 g/dL (ref 6.0–8.3)
Total Bilirubin: 0.4 mg/dL (ref 0.2–1.2)

## 2016-04-20 LAB — MICROALBUMIN / CREATININE URINE RATIO
Creatinine,U: 84.2 mg/dL
MICROALB UR: 3.1 mg/dL — AB (ref 0.0–1.9)
Microalb Creat Ratio: 3.7 mg/g (ref 0.0–30.0)

## 2016-04-20 LAB — HEMOGLOBIN A1C: HEMOGLOBIN A1C: 7.5 % — AB (ref 4.6–6.5)

## 2016-04-20 LAB — TSH: TSH: 2.59 u[IU]/mL (ref 0.35–4.50)

## 2016-04-20 LAB — LDL CHOLESTEROL, DIRECT: Direct LDL: 203 mg/dL

## 2016-04-21 LAB — LIPOPROTEIN ANALYSIS BY NMR
HDL Particle Number: 28.5 umol/L — ABNORMAL LOW (ref >=30.5)
LDL Particle Number: 2716 nmol/L — ABNORMAL HIGH (ref <1000)
LDL Size: 20.3 nm (ref >20.5)
LP-IR Score: 77 — ABNORMAL HIGH (ref <=45)
Small LDL Particle Number: 1608 nmol/L — ABNORMAL HIGH (ref <=527)

## 2016-04-25 ENCOUNTER — Other Ambulatory Visit: Payer: Self-pay

## 2016-04-25 ENCOUNTER — Ambulatory Visit (INDEPENDENT_AMBULATORY_CARE_PROVIDER_SITE_OTHER): Payer: Commercial Managed Care - HMO | Admitting: Endocrinology

## 2016-04-25 ENCOUNTER — Encounter: Payer: Self-pay | Admitting: Endocrinology

## 2016-04-25 VITALS — BP 120/70 | HR 68 | Ht 63.0 in | Wt 174.0 lb

## 2016-04-25 DIAGNOSIS — E782 Mixed hyperlipidemia: Secondary | ICD-10-CM

## 2016-04-25 DIAGNOSIS — E1165 Type 2 diabetes mellitus with hyperglycemia: Secondary | ICD-10-CM

## 2016-04-25 MED ORDER — RAMIPRIL 2.5 MG PO CAPS: ORAL_CAPSULE | ORAL | 0 refills | Status: DC

## 2016-04-25 MED ORDER — METFORMIN HCL 1000 MG PO TABS: ORAL_TABLET | ORAL | 0 refills | Status: DC

## 2016-04-25 MED ORDER — EVOLOCUMAB 140 MG/ML ~~LOC~~ SOAJ: 140.0000 mg | SUBCUTANEOUS | 1 refills | Status: DC

## 2016-04-25 MED ORDER — DULAGLUTIDE 0.75 MG/0.5ML ~~LOC~~ SOAJ: SUBCUTANEOUS | 0 refills | Status: DC

## 2016-04-25 NOTE — Patient Instructions (Signed)
Check blood sugars on waking up  3-4 x per week  Also check blood sugars about 2 hours after a meal and do this after different meals by rotation  Recommended blood sugar levels on waking up is 90-130 and about 2 hours after meal is 130-160  Please bring your blood sugar monitor to each visit, thank you  Start TRULICITYwith the pen as shown once weekly on the same day of the week.  You may inject in the stomach, thigh or arm as indicated in the brochure given.  You will feel fullness of the stomach with starting the medication and should try to keep the portions at meals small.  You may experience nausea in the first few days which usually gets better over time   If any questions or concerns are present call the office or the  [place] at [phone]. Also visit [email or web address] website for more useful information

## 2016-04-25 NOTE — Progress Notes (Signed)
Patient ID: Joanna [name], female   DOB: 1950-07-15, 65 y.o.   MRN: [id number]           Reason for Appointment: Follow-up for Type 2 Diabetes  Referring physician: Deborra Medina  History of Present Illness:          Diagnosis: Type 2 diabetes mellitus, date of diagnosis: 2010?       Recent history:  On her initial consultation she was advised to start Tanzeum for multiple benefits including long-term control; was started in 12/15.   She is also on metformin  She has not been seen in follow-up since 6/17, generally is regular with follow-up A1c is again slightly higher at 7.5, previously 7.1        Non-insulin hypoglycemic drugs the patient is taking are: Metformin 1 g once a day.  Tanzeum 50 mg weekly  Glucose patterns and problems identified:  She ran out of Tanzeum about 3 weeks ago but she does not know if her sugars are much better before diet  She has persistently high blood sugars now with most readings around 200 given fasting and at times higher in the afternoon  She says because of her foot problem she is not able to exercise or walk  Has been able to maintain her weight even without exercise, he she tries to follow-up fairly good diet overall without any formal meal plan  Her blood sugars appear to be higher early morning FASTING and relatively better late morning including on the lab when it was 146  Usually not checking many readings after supper especially recently    Side effects from medications have been: none Compliance with the medical regimen: fair    Glucose monitoring:  done once or twice a day         Glucometer: One Touch ultra mini,    Blood Glucose readings  By   Mean values apply above for all meters except median for One Touch  PRE-MEAL Fasting Lunch Dinner Bedtime Overall  Glucose range: 124-251   144-306  142-245    Mean/median: 206   246  221  213, +/-45    Self-care: The diet that the patient has been following is: tries to limit fat intake.       Meals: 3 meals per day. Breakfast is egg, toast, sometimes cereal /oatmeal.  Occasionally will have fried food  Lunch is a sandwich or soup.  Snacks: Cottage cheese and fruit, no sweet drinks juices          Exercise: Minimal recently except upper body exercises because of foot problems    Dietician visit, most recent: never.               Weight history:    Wt Readings from Last 3 Encounters:  04/25/16 174 lb (78.9 kg)  10/28/15 173 lb 6 oz (78.6 kg)  05/11/15 174 lb 9.6 oz (79.2 kg)    Glycemic control:   Lab Results  Component Value Date   HGBA1C 7.5 (H) 04/20/2016   HGBA1C 7.1 (H) 10/26/2015   HGBA1C 6.9 (H) 05/06/2015   Lab Results  Component Value Date   MICROALBUR 3.1 (H) 04/20/2016   LDLCALC 209 (H) 02/12/2014   CREATININE 0.66 04/20/2016    Past diabetes history:  For several years prior to her diagnosis she had been complaining of her feet burning Her blood sugars were mildly increased initially at diagnosis and she did try to control it with diet alone and exercise without getting  them back to normal.  Her A1c had continue to be around 7% with the lowest one 6.7  About 2012 she was started on metformin probably when her A1c was 7.3.   In 2015 her blood sugars had been higher with A1c 8.1%  HYPERLIPIDEMIA: Discussed in review of systems    Medication List       Accurate as of 04/25/16  4:18 PM. Always use your most recent med list.          Albiglutide 50 MG Pen Commonly known as:  TANZEUM INJECT THE CONTENTS OF ONE PEN ONCE PER WEEK   albuterol 108 (90 Base) MCG/ACT inhaler Commonly known as:  PROVENTIL HFA;VENTOLIN HFA Inhale 2 puffs into the lungs every 6 (six) hours as needed.   chlorpheniramine-HYDROcodone 10-8 MG/5ML Suer Commonly known as:  TUSSIONEX PENNKINETIC ER Take 5 mLs by mouth every 12 (twelve) hours as needed.   Evolocumab 140 MG/ML Soaj Commonly known as:  REPATHA SURECLICK Inject [id number] mg into the skin every 14 (fourteen) days.    levothyroxine 112 MCG tablet Commonly known as:  SYNTHROID, LEVOTHROID TAKE 1 TABLET BY MOUTH ONCE A DAY   metFORMIN 1000 MG tablet Commonly known as:  GLUCOPHAGE TAKE 1 TABLET (1,000 MG TOTAL) BY MOUTH 2 (TWO) TIMES DAILY.   ONE TOUCH ULTRA TEST test strip Generic drug:  glucose blood USE TO CHECK BLOOD SUGAR TWICE DAILY   ramipril 2.5 MG capsule Commonly known as:  ALTACE TAKE 1 CAPSULE (2.5 MG TOTAL) BY MOUTH DAILY.       Allergies:  Allergies  Allergen Reactions  . Crestor [Rosuvastatin]     REACTION: N/V and heartburn  . Lipitor [Atorvastatin]     REACTION: Hot flashes and flu-like symptoms  . Niacin     REACTION: n/v  . Pravachol [Pravastatin Sodium]   . Pravastatin Sodium     REACTION: Neck swelling and pain in her shoulders and arms.  . Sulfonamide Derivatives     Rxn Unknown  . Zocor [Simvastatin]     REACTION: GI    Past Medical History:  Diagnosis Date  . Adenomatous colon polyp   . Diabetes mellitus    type II--ORAL MEDICATION - NO INSULIN  . Diverticulitis of colon   . Foot injury 08   Left foot/leg with ?? torn muscle -PROBLEM HAS RESOLVED  . Hand pain 1/09   steroid injections--RESOLVED  . Hx of renal calculi    LEFT  . Hyperlipidemia   . Hypertension   . Hypothyroidism    pt states hx of thyroid nodules  . IBS (irritable bowel syndrome)   . Internal hemorrhoids   . Sleep apnea    USES CPAP - SETTING IS 14    Past Surgical History:  Procedure Laterality Date  . COLONOSCOPY W/ BIOPSIES AND POLYPECTOMY  05/19/2005   adenomatous polyps  . NEPHROLITHOTOMY Left 01/27/2013   Procedure: NEPHROLITHOTOMY PERCUTANEOUS;  Surgeon: Dutch Gray, MD;  Location: [facility];  Service: Urology;  Laterality: Left;  . TUBAL LIGATION    . WISDOM TEETH EXTRACTED      Family History  Problem Relation Age of Onset  . Hyperlipidemia Mother   . Thyroid disease Daughter   . Diabetes Neg Hx     Social History:  reports that she has never smoked. She has never  used smokeless tobacco. She reports that she does not drink alcohol or use drugs.     Review of Systems        LIPIDS:  she has long-standing severe hypercholesterolemia and has been intolerant to all statin drugs with various reactions Crestor caused vomiting.  Lipitor caused flulike symptoms, abdominal bloating and hot flashes, pravastatin caused neck swelling and upper body pain, Zocor caused GI upset.  She is not able to tolerate Zetia as she thinks it upset his stomach   She had been explained the need for management of her lipids with medications as she has familial hypercholesterolemia  Repatha was approved through her insurance  Prescription has been sent a couple of times but she has not been interested in trying this and once the drugstore was not able to get the prescription.  She is also concerned about possible side effects even though safety was discussed in detail She thinks that even though her Hyperlipidemia's familial that her mother has not had any heart problems  Discussed lipid levels and today gave her information on the LDL particle number and the fact that it is again well over 2500 with ideal levels of below 1000 Again LDL is over 200  She finally agrees to try the medication and will [facility]      Lab Results  Component Value Date   CHOL 278 (H) 04/20/2016   HDL 40.30 04/20/2016   LDLCALC 209 (H) 02/12/2014   LDLDIRECT 203.0 04/20/2016   TRIG 244.0 (H) 04/20/2016   CHOLHDL 7 04/20/2016               Thyroid:    She has had hypothyroidism for about 10 years and initially had a goiter also; highest TSH probably about 9.2 Her last thyroid ultrasound showed only a 6 mm nodule which was stable Thyroid levels have been fairly normal and consistent  Last TSH normal in 11/17  Lab Results  Component Value Date   TSH 2.59 04/20/2016   TSH 2.75 10/26/2015   TSH 0.77 12/24/2014   FREET4 1.03 02/12/2014   FREET4 1.04  12/09/2013   FREET4 0.90 09/26/2012        The blood pressure Is well-controlled with only 2.5 mg ramipril   BP Readings from Last 3 Encounters:  04/25/16 120/70  10/28/15 (!) 149/86  05/11/15 128/80            LABS:  Lab on 04/20/2016  Component Date Value Ref Range Status  . Hgb A1c MFr Bld 04/20/2016 7.5* 4.6 - 6.5 % Final  . Sodium 04/20/2016 137  135 - 145 mEq/L Final  . Potassium 04/20/2016 4.5  3.5 - 5.1 mEq/L Final  . Chloride 04/20/2016 101  96 - 112 mEq/L Final  . CO2 04/20/2016 28  19 - 32 mEq/L Final  . Glucose, Bld 04/20/2016 146* 70 - 99 mg/dL Final  . BUN 04/20/2016 17  6 - 23 mg/dL Final  . Creatinine, Ser 04/20/2016 0.66  0.40 - 1.20 mg/dL Final  . Total Bilirubin 04/20/2016 0.4  0.2 - 1.2 mg/dL Final  . Alkaline Phosphatase 04/20/2016 67  39 - 117 U/L Final  . AST 04/20/2016 38* 0 - 37 U/L Final  . ALT 04/20/2016 68* 0 - 35 U/L Final  . Total Protein 04/20/2016 7.3  6.0 - 8.3 g/dL Final  . Albumin 04/20/2016 4.5  3.5 - 5.2 g/dL Final  . Calcium 04/20/2016 9.8  8.4 - 10.5 mg/dL Final  . GFR 04/20/2016 95.96  >60.00 mL/min Final  . Microalb, Ur 04/20/2016 3.1* 0.0 - 1.9 mg/dL Final  . Creatinine,U 04/20/2016 84.2  mg/dL Final  .  Microalb Creat Ratio 04/20/2016 3.7  0.0 - 30.0 mg/g Final  . LDL Particle Number 04/21/2016 [id number]* <1,000 nmol/L Final   Comment:                           Low                   < 1000                           Moderate         1000 - 1299                           Borderline-High  1300 - 1599                           High             1600 - 2000                           Very High             > 2000   . HDL Particle Number 04/21/2016 28.5* >=30.5 umol/L Final  . Small LDL Particle Number 04/21/2016 [id number]* <=527 nmol/L Final  . LDL Size 04/21/2016 20.3  >20.5 nm Final   Comment:  ----------------------------------------------------------                  ** INTERPRETATIVE INFORMATION**                  PARTICLE CONCENTRATION  AND SIZE                     <--Lower CVD Risk   Higher CVD Risk-->   LDL AND HDL PARTICLES   Percentile in Reference Population   HDL-P (total)        High     75th    50th    25th   Low                        >34.9    34.9    30.5    26.7   <26.7   Small LDL-P          Low      25th    50th    75th   High                        <117     117     527     839    >839   LDL Size   <-Large (Pattern A)->    <-Small (Pattern B)->                     23.0    20.6           20.5      19.0  ---------------------------------------------------------- Small LDL-P and LDL Size are associated with CVD risk, but not after LDL-P is taken into account. These assays were developed and their performance characteristics determined by [employer]. These assays have not been cleared by the [place] Food and Drug Administration. The clinical utility o  f these laboratory values have not been fully established.   Marland Kitchen LP-IR Score 04/21/2016 77* <=45 Final   Comment: INSULIN RESISTANCE MARKER     <--Insulin Sensitive    Insulin Resistant-->            Percentile in Reference Population Insulin Resistance Score LP-IR Score   Low   25th   50th   75th   High               <27   27     45     63     >63 LP-IR Score is inaccurate if patient is non-fasting. The LP-IR score is a laboratory developed index that has been associated with insulin resistance and diabetes risk and should be used as one component of a physician's clinical assessment. The LP-IR score listed above has not been cleared by the [place] Food and Drug Administration.   . Cholesterol 04/20/2016 278* 0 - 200 mg/dL Final  . Triglycerides 04/20/2016 244.0* 0.0 - 149.0 mg/dL Final  . HDL 04/20/2016 40.30  >39.00 mg/dL Final  . VLDL 04/20/2016 48.8* 0.0 - 40.0 mg/dL Final  . Total CHOL/HDL Ratio 04/20/2016 7   Final  . NonHDL 04/20/2016 237.76   Final  . TSH 04/20/2016 2.59  0.35 - 4.50 uIU/mL Final  . Direct LDL 04/20/2016 203.0   mg/dL Final    Physical Examination:  BP 120/70   Pulse 68   Ht 5\' 3"  (1.6 m)   Wt 174 lb (78.9 kg)   SpO2 96%   BMI 30.82 kg/m          ASSESSMENT/PLAN:   Diabetes type 2, with BMI 31  See history of present illness for details of her current blood sugar patterns and management Although her A1c is 7.5 with his progressively increasing Also most recently her blood sugars are fairly consistently over 200 at most times especially with her going off tangent She is not motivated to take care of her diabetes and is regular with her follow-up  Also currently not exercising Has not seen the dietitian previously  Discussed with the patient that tends he may not be available after a few months and will need to change to a more effective drug for her high A1c Also discussed possibility of maybe using basal insulin if fasting readings are consistently high Discussed Trulicity and how this would be used as well as how to use the pen. 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 Any transcriptional errors that result from this process are unintentional.

## 2016-04-26 ENCOUNTER — Other Ambulatory Visit: Payer: Self-pay

## 2016-04-26 MED ORDER — RAMIPRIL 2.5 MG PO CAPS: ORAL_CAPSULE | ORAL | 1 refills | Status: DC

## 2016-04-26 MED ORDER — DULAGLUTIDE 0.75 MG/0.5ML ~~LOC~~ SOAJ: SUBCUTANEOUS | 1 refills | Status: DC

## 2016-04-26 MED ORDER — METFORMIN HCL 1000 MG PO TABS: ORAL_TABLET | ORAL | 1 refills | Status: DC

## 2016-05-01 ENCOUNTER — Telehealth: Payer: Self-pay | Admitting: Endocrinology

## 2016-05-01 NOTE — Telephone Encounter (Signed)
[facility] [address] [facility] [place]

## 2016-05-02 NOTE — Telephone Encounter (Signed)
Called but did not get an answer I left a voice mail requesting a call back- do not know what prescription we a re referring to

## 2016-05-05 ENCOUNTER — Other Ambulatory Visit: Payer: Self-pay | Admitting: Endocrinology

## 2016-05-05 ENCOUNTER — Telehealth: Payer: Self-pay | Admitting: Endocrinology

## 2016-05-05 NOTE — Telephone Encounter (Signed)
[facility] [place]  I have removed all of the others per her request

## 2016-05-09 ENCOUNTER — Other Ambulatory Visit: Payer: Self-pay

## 2016-05-09 MED ORDER — DULAGLUTIDE 0.75 MG/0.5ML ~~LOC~~ SOAJ: SUBCUTANEOUS | 2 refills | Status: DC

## 2016-05-10 ENCOUNTER — Emergency Department (HOSPITAL_COMMUNITY): Payer: Commercial Managed Care - HMO

## 2016-05-10 ENCOUNTER — Encounter (HOSPITAL_COMMUNITY): Payer: Self-pay | Admitting: Vascular Surgery

## 2016-05-10 ENCOUNTER — Inpatient Hospital Stay (HOSPITAL_COMMUNITY)
Admission: EM | Admit: 2016-05-10 | Discharge: 2016-05-11 | DRG: 066 | Disposition: A | Discharge status: Home / Self Care | Payer: Commercial Managed Care - HMO | Attending: Internal Medicine | Admitting: Internal Medicine

## 2016-05-10 DIAGNOSIS — I639 Cerebral infarction, unspecified: Secondary | ICD-10-CM | POA: Diagnosis present

## 2016-05-10 DIAGNOSIS — E1165 Type 2 diabetes mellitus with hyperglycemia: Secondary | ICD-10-CM | POA: Diagnosis present

## 2016-05-10 DIAGNOSIS — E118 Type 2 diabetes mellitus with unspecified complications: Secondary | ICD-10-CM | POA: Diagnosis not present

## 2016-05-10 DIAGNOSIS — I10 Essential (primary) hypertension: Secondary | ICD-10-CM | POA: Diagnosis present

## 2016-05-10 DIAGNOSIS — I6789 Other cerebrovascular disease: Secondary | ICD-10-CM | POA: Diagnosis not present

## 2016-05-10 DIAGNOSIS — G459 Transient cerebral ischemic attack, unspecified: Secondary | ICD-10-CM

## 2016-05-10 DIAGNOSIS — Z79899 Other long term (current) drug therapy: Secondary | ICD-10-CM | POA: Diagnosis not present

## 2016-05-10 DIAGNOSIS — I6381 Other cerebral infarction due to occlusion or stenosis of small artery: Secondary | ICD-10-CM | POA: Diagnosis present

## 2016-05-10 DIAGNOSIS — Z888 Allergy status to other drugs, medicaments and biological substances status: Secondary | ICD-10-CM | POA: Diagnosis not present

## 2016-05-10 DIAGNOSIS — E1151 Type 2 diabetes mellitus with diabetic peripheral angiopathy without gangrene: Secondary | ICD-10-CM | POA: Diagnosis present

## 2016-05-10 DIAGNOSIS — F419 Anxiety disorder, unspecified: Secondary | ICD-10-CM | POA: Diagnosis present

## 2016-05-10 DIAGNOSIS — E119 Type 2 diabetes mellitus without complications: Secondary | ICD-10-CM

## 2016-05-10 DIAGNOSIS — G4733 Obstructive sleep apnea (adult) (pediatric): Secondary | ICD-10-CM | POA: Diagnosis present

## 2016-05-10 DIAGNOSIS — Z882 Allergy status to sulfonamides status: Secondary | ICD-10-CM | POA: Diagnosis not present

## 2016-05-10 DIAGNOSIS — R200 Anesthesia of skin: Secondary | ICD-10-CM

## 2016-05-10 DIAGNOSIS — E063 Autoimmune thyroiditis: Secondary | ICD-10-CM | POA: Diagnosis present

## 2016-05-10 DIAGNOSIS — E785 Hyperlipidemia, unspecified: Secondary | ICD-10-CM | POA: Diagnosis present

## 2016-05-10 DIAGNOSIS — Z7984 Long term (current) use of oral hypoglycemic drugs: Secondary | ICD-10-CM | POA: Diagnosis not present

## 2016-05-10 LAB — DIFFERENTIAL
BASOS ABS: 0.1 10*3/uL (ref 0.0–0.1)
Basophils Relative: 1 %
Eosinophils Absolute: 0.4 10*3/uL (ref 0.0–0.7)
Eosinophils Relative: 5 %
LYMPHS ABS: 2.5 10*3/uL (ref 0.7–4.0)
LYMPHS PCT: 38 %
Monocytes Absolute: 0.5 10*3/uL (ref 0.1–1.0)
Monocytes Relative: 7 %
NEUTROS ABS: 3.3 10*3/uL (ref 1.7–7.7)
NEUTROS PCT: 49 %

## 2016-05-10 LAB — CBG MONITORING, ED: GLUCOSE-CAPILLARY: 110 mg/dL — AB (ref 65–99)

## 2016-05-10 LAB — CBC
HCT: 41.2 % (ref 36.0–46.0)
HEMOGLOBIN: 14.1 g/dL (ref 12.0–15.0)
MCH: 29.6 pg (ref 26.0–34.0)
MCHC: 34.2 g/dL (ref 30.0–36.0)
MCV: 86.6 fL (ref 78.0–100.0)
PLATELETS: 258 10*3/uL (ref 150–400)
RBC: 4.76 MIL/uL (ref 3.87–5.11)
RDW: 12.9 % (ref 11.5–15.5)
WBC: 6.6 10*3/uL (ref 4.0–10.5)

## 2016-05-10 LAB — COMPREHENSIVE METABOLIC PANEL
ALBUMIN: 4.5 g/dL (ref 3.5–5.0)
ALK PHOS: 62 U/L (ref 38–126)
ALT: 70 U/L — AB (ref 14–54)
ANION GAP: 11 (ref 5–15)
AST: 43 U/L — AB (ref 15–41)
BILIRUBIN TOTAL: 0.4 mg/dL (ref 0.3–1.2)
BUN: 12 mg/dL (ref 6–20)
CO2: 25 mmol/L (ref 22–32)
CREATININE: 0.64 mg/dL (ref 0.44–1.00)
Calcium: 9.9 mg/dL (ref 8.9–10.3)
Chloride: 100 mmol/L — ABNORMAL LOW (ref 101–111)
GFR calc Af Amer: >60 mL/min (ref >60)
GFR calc non Af Amer: >60 mL/min (ref >60)
GLUCOSE: 112 mg/dL — AB (ref 65–99)
Potassium: 4.1 mmol/L (ref 3.5–5.1)
SODIUM: 136 mmol/L (ref 135–145)
TOTAL PROTEIN: 7.3 g/dL (ref 6.5–8.1)

## 2016-05-10 LAB — APTT: aPTT: 32 seconds (ref 24–36)

## 2016-05-10 LAB — I-STAT TROPONIN, ED: Troponin i, poc: 0 ng/mL (ref 0.00–0.08)

## 2016-05-10 LAB — PROTIME-INR
INR: 0.98
Prothrombin Time: 13 seconds (ref 11.4–15.2)

## 2016-05-10 MED ORDER — SODIUM CHLORIDE 0.9 % IV SOLN
INTRAVENOUS | Status: DC
  Administered 2016-05-11: 01:00:00 via INTRAVENOUS

## 2016-05-10 MED ORDER — LEVOTHYROXINE SODIUM 112 MCG PO TABS
112.0000 ug | ORAL_TABLET | Freq: Every day | ORAL | Status: DC
  Administered 2016-05-11: 112 ug via ORAL
  Filled 2016-05-10: qty 1

## 2016-05-10 MED ORDER — ACETAMINOPHEN 325 MG PO TABS: 650.0000 mg | ORAL_TABLET | ORAL | Status: DC | PRN

## 2016-05-10 MED ORDER — SENNOSIDES-DOCUSATE SODIUM 8.6-50 MG PO TABS: 1.0000 | ORAL_TABLET | Freq: Every evening | ORAL | Status: DC | PRN

## 2016-05-10 MED ORDER — INSULIN ASPART 100 UNIT/ML ~~LOC~~ SOLN: 0.0000 [IU] | Freq: Every day | SUBCUTANEOUS | Status: DC

## 2016-05-10 MED ORDER — ALBUTEROL SULFATE (2.5 MG/3ML) 0.083% IN NEBU: 3.0000 mL | INHALATION_SOLUTION | Freq: Four times a day (QID) | RESPIRATORY_TRACT | Status: DC | PRN

## 2016-05-10 MED ORDER — ASPIRIN 325 MG PO TABS
325.0000 mg | ORAL_TABLET | Freq: Every day | ORAL | Status: DC
  Administered 2016-05-11: 325 mg via ORAL
  Filled 2016-05-10: qty 1

## 2016-05-10 MED ORDER — ACETAMINOPHEN 160 MG/5ML PO SOLN: 650.0000 mg | ORAL | Status: DC | PRN

## 2016-05-10 MED ORDER — ASPIRIN 300 MG RE SUPP: 300.0000 mg | Freq: Every day | RECTAL | Status: DC

## 2016-05-10 MED ORDER — STROKE: EARLY STAGES OF RECOVERY BOOK
Freq: Once | Status: AC
  Administered 2016-05-11: 01:00:00
  Filled 2016-05-10: qty 1

## 2016-05-10 MED ORDER — GADOBENATE DIMEGLUMINE 529 MG/ML IV SOLN
17.0000 mL | Freq: Once | INTRAVENOUS | Status: AC | PRN
  Administered 2016-05-10: 17 mL via INTRAVENOUS

## 2016-05-10 MED ORDER — ACETAMINOPHEN 650 MG RE SUPP: 650.0000 mg | RECTAL | Status: DC | PRN

## 2016-05-10 MED ORDER — INSULIN ASPART 100 UNIT/ML ~~LOC~~ SOLN
0.0000 [IU] | Freq: Three times a day (TID) | SUBCUTANEOUS | Status: DC
  Administered 2016-05-11: 2 [IU] via SUBCUTANEOUS

## 2016-05-10 NOTE — Progress Notes (Signed)
Patient arrived to unit via ED staff. Vitals stable, patient orient to unit/ room. Tele applied and verified. Admission completed.  Continue to monitor patient.

## 2016-05-10 NOTE — ED Triage Notes (Addendum)
Pt reports to the ED for eval of facial numbness. Pt has uncontrolled DM and she is followed by endocrinology. She reports that yesterday she had some numbness in her stomach where she had shingles several years ago. Then at 12 am last night she noticed her jaw felt numbness. She reports that she had diarrhea at 4 am and she noticed that she had some left sided weakness. She reports she is still having some left sided numbness and weakness but she went to work because she thought it would get better. She reports she took her BP and it was high in the 170s and she couldn't feel the BP cuff taking her BP. Denies any HA, vision changes, aphasia, dysphagia, or facial asymmetry. Left hand grip notably weaker.

## 2016-05-10 NOTE — H&P (Signed)
Joanna [name] [id number] DOB: 03/06/1951 DOA: 05/10/2016     PCP: Arnette Norris, MD   Outpatient Specialists: Endocrinology Dr. Dwyane Dee Patient coming from:    home Lives alone,      Chief Complaint: Facial numbness and left-sided weakness  HPI: Joanna [name] is a 65 y.o. female with medical history significant of HTN, DM 2  hypothyroidism, shingles, history of OSA    Presented with facial numbness that started at 12 AM last night.  Patient also endorses some diarrhea today she checked her blood pressure today was high in 170s but she could not feel her blood pressure cuff in plating on her arm she haven't had any headache note dysphasia and no slurred speech no facial asymmetry. Patient went to work thinking is may improve but continued to be persistently symptomatic. She has hard time holding onto objects but denies fevers or chills no chest pain.  She reports still some weakness on the left side no chest pain  Regarding pertinent Chronic problems: She has no history of diabetes it's poorly controlled   IN ER:  Temp (24hrs), Avg:97.8 F (36.6 C), Min:97.7 F (36.5 C), Max:97.9 F (36.6 C)  RR 16 Oxygen st 99% HR 75 BP 169/92  Trop 0.00 WBC 6.6 Hg 14.1 WBC 6.0 cr 0.64 MRI 13 x 9 mm acute thalamic infarct.  Following Medications were ordered in ER: Medications  gadobenate dimeglumine (MULTIHANCE) injection 17 mL (17 mLs Intravenous Contrast Given 05/10/16 2027)     ER provider discussed case with:  Neurology who recommends admission for CVA workup  Hospitalist was called for admission for CVA  Review of Systems:    Pertinent positives include:  localizing neurological complaints, facial numbness  tingling,   weakness,  Constitutional:  No weight loss, night sweats, Fevers, chills, fatigue, weight loss  HEENT:  No headaches, Difficulty swallowing,Tooth/dental problems,Sore throat,  No sneezing, itching, ear ache, nasal congestion, post nasal drip,  Cardio-vascular:    No chest pain, Orthopnea, PND, anasarca, dizziness, palpitations.no Bilateral lower extremity swelling  GI:  No heartburn, indigestion, abdominal pain, nausea, vomiting, diarrhea, change in bowel habits, loss of appetite, melena, blood in stool, hematemesis Resp:  no shortness of breath at rest. No dyspnea on exertion, No excess mucus, no productive cough, No non-productive cough, No coughing up of blood.No change in color of mucus.No wheezing. Skin:  no rash or lesions. No jaundice GU:  no dysuria, change in color of urine, no urgency or frequency. No straining to urinate.  No flank pain.  Musculoskeletal:  No joint pain or no joint swelling. No decreased range of motion. No back pain.  Psych:  No change in mood or affect. No depression or anxiety. No memory loss.  Neuro: no double vision, no gait abnormality, no slurred speech, no confusion  As per HPI otherwise 10 point review of systems negative.   Past Medical History: Past Medical History:  Diagnosis Date  . Adenomatous colon polyp   . Diabetes mellitus    type II--ORAL MEDICATION - NO INSULIN  . Diverticulitis of colon   . Foot injury 08   Left foot/leg with ?? torn muscle -PROBLEM HAS RESOLVED  . Hand pain 1/09   steroid injections--RESOLVED  . Hx of renal calculi    LEFT  . Hyperlipidemia   . Hypertension   . Hypothyroidism    pt states hx of thyroid nodules  . IBS (irritable bowel syndrome)   . Internal hemorrhoids   . Sleep apnea  USES CPAP - SETTING IS 14   Past Surgical History:  Procedure Laterality Date  . COLONOSCOPY W/ BIOPSIES AND POLYPECTOMY  05/19/2005   adenomatous polyps  . NEPHROLITHOTOMY Left 01/27/2013   Procedure: NEPHROLITHOTOMY PERCUTANEOUS;  Surgeon: Dutch Gray, MD;  Location: [facility];  Service: Urology;  Laterality: Left;  . TUBAL LIGATION    . WISDOM TEETH EXTRACTED       Social History:  Ambulatory   independently      reports that she has never smoked. She has never used  smokeless tobacco. She reports that she does not drink alcohol or use drugs.  Allergies:   Allergies  Allergen Reactions  . Crestor [Rosuvastatin]     REACTION: N/V and heartburn  . Lipitor [Atorvastatin]     REACTION: Hot flashes and flu-like symptoms  . Niacin     REACTION: n/v  . Pravachol [Pravastatin Sodium]   . Pravastatin Sodium     REACTION: Neck swelling and pain in her shoulders and arms.  . Sulfonamide Derivatives     Rxn Unknown  . Zocor [Simvastatin]     REACTION: GI       Family History:   Family History  Problem Relation Age of Onset  . Hyperlipidemia Mother   . Thyroid disease Daughter   . Diabetes Neg Hx     Medications: Prior to Admission medications   Medication Sig Start Date End Date Taking? Authorizing Provider  Albiglutide (TANZEUM) 50 MG PEN INJECT THE CONTENTS OF ONE PEN ONCE PER WEEK 09/14/15   Elayne Snare, MD  albuterol (PROVENTIL HFA;VENTOLIN HFA) 108 (90 BASE) MCG/ACT inhaler Inhale 2 puffs into the lungs every 6 (six) hours as needed. 05/03/15   [name], MD  chlorpheniramine-HYDROcodone (TUSSIONEX PENNKINETIC ER) 10-8 MG/5ML SUER Take 5 mLs by mouth every 12 (twelve) hours as needed. Patient not taking: Reported on 04/25/2016 05/03/15   [name], MD  Dulaglutide (TRULICITY) A999333 0000000 SOPN Inject 0.75 weekly 05/09/16   Elayne Snare, MD  Evolocumab (REPATHA SURECLICK) [id number] MG/ML SOAJ Inject 140 mg into the skin every 14 (fourteen) days. 04/25/16   Elayne Snare, MD  levothyroxine (SYNTHROID, LEVOTHROID) 112 MCG tablet TAKE 1 TABLET BY MOUTH ONCE A DAY 01/06/16   [name], MD  metFORMIN (GLUCOPHAGE) 1000 MG tablet TAKE 1 TABLET (1,000 MG TOTAL) BY MOUTH 2 (TWO) TIMES DAILY. 04/26/16   Elayne Snare, MD  metFORMIN (GLUCOPHAGE) 1000 MG tablet TAKE 1 TABLET (1,000 MG TOTAL) BY MOUTH 2 (TWO) TIMES DAILY. 05/05/16   Elayne Snare, MD  ONE TOUCH ULTRA TEST test strip USE TO CHECK BLOOD SUGAR TWICE DAILY 01/27/16   Elayne Snare, MD  ramipril (ALTACE) 2.5 MG  capsule TAKE 1 CAPSULE (2.5 MG TOTAL) BY MOUTH DAILY. 04/26/16   Elayne Snare, MD  ramipril (ALTACE) 2.5 MG capsule TAKE 1 CAPSULE (2.5 MG TOTAL) BY MOUTH DAILY. 05/05/16   Elayne Snare, MD    Physical Exam: Patient Vitals for the past 24 hrs:  BP Temp Temp src Pulse Resp SpO2  05/10/16 2140 169/92 - - 75 16 99 %  05/10/16 1915 148/82 - - 61 14 98 %  05/10/16 1857 - 97.7 F (36.5 C) - - - -  05/10/16 1845 140/83 - - 66 15 98 %  05/10/16 1831 164/97 - - 67 10 98 %  05/10/16 1649 169/93 97.9 F (36.6 C) Oral 66 18 98 %    1. General:  in No Acute distress 2. Psychological: Alert and Oriented  3. Head/ENT:     Dry Mucous Membranes                          Head Non traumatic, neck supple                          Normal  Dentition 4. SKIN:   decreased Skin turgor,  Skin clean Dry and intact no rash 5. Heart: Regular rate and rhythm no  Murmur, Rub or gallop 6. Lungs:  Clear to auscultation bilaterally, no wheezes or crackles   7. Abdomen: Soft, non-tender, Non distended 8. Lower extremities: no clubbing, cyanosis, or edema 9. Neurologically slightly diminished strength on the left with pronator drift  cranial nerves II through XII intact, nystagmus present 10. MSK: Normal range of motion   body mass index is unknown because there is no height or weight on file.  Labs on Admission:   Labs on Admission: I have personally reviewed following labs and imaging studies  CBC:  Recent Labs Lab 05/10/16 1650  WBC 6.6  NEUTROABS 3.3  HGB 14.1  HCT 41.2  MCV 86.6  PLT 0000000   Basic Metabolic Panel:  Recent Labs Lab 05/10/16 1650  NA 136  K 4.1  CL 100*  CO2 25  GLUCOSE 112*  BUN 12  CREATININE 0.64  CALCIUM 9.9   GFR: CrCl cannot be calculated (Unknown ideal weight.). Liver Function Tests:  Recent Labs Lab 05/10/16 1650  AST 43*  ALT 70*  ALKPHOS 62  BILITOT 0.4  PROT 7.3  ALBUMIN 4.5   No results for input(s): LIPASE, AMYLASE in the last 168 hours. No results for  input(s): AMMONIA in the last 168 hours. Coagulation Profile:  Recent Labs Lab 05/10/16 1650  INR 0.98   Cardiac Enzymes: No results for input(s): CKTOTAL, CKMB, CKMBINDEX, TROPONINI in the last 168 hours. BNP (last 3 results) No results for input(s): PROBNP in the last 8760 hours. HbA1C: No results for input(s): HGBA1C in the last 72 hours. CBG:  Recent Labs Lab 05/10/16 1648  GLUCAP 110*   Lipid Profile: No results for input(s): CHOL, HDL, LDLCALC, TRIG, CHOLHDL, LDLDIRECT in the last 72 hours. Thyroid Function Tests: No results for input(s): TSH, T4TOTAL, FREET4, T3FREE, THYROIDAB in the last 72 hours. Anemia Panel: No results for input(s): VITAMINB12, FOLATE, FERRITIN, TIBC, IRON, RETICCTPCT in the last 72 hours.  Sepsis Labs: @LABRCNTIP (procalcitonin:4,lacticidven:4) )No results found for this or any previous visit (from the past 240 hour(s)).     UA  not ordered  Lab Results  Component Value Date   HGBA1C 7.5 (H) 04/20/2016    CrCl cannot be calculated (Unknown ideal weight.).  BNP (last 3 results) No results for input(s): PROBNP in the last 8760 hours.   ECG REPORT  Independently reviewed Rate 80  Rhythm: Normal sinus rhythm ST&T Change: Flattened T waves QTC 454  There were no vitals filed for this visit.   Cultures: No results found for: SDES, SPECREQUEST, CULT, REPTSTATUS   Radiological Exams on Admission: Mr Jeri Cos Wo Contrast  Result Date: 05/10/2016 CLINICAL DATA:  Facial numbness. LEFT jaw numbness beginning at midnight, diarrhea and LEFT-sided weakness. Hypertensive. History of diabetes, hypertension, hyperlipidemia. EXAM: MRI HEAD WITHOUT AND WITH CONTRAST TECHNIQUE: Multiplanar, multiecho pulse sequences of the brain and surrounding structures were obtained without and with intravenous contrast. CONTRAST:  58mL MULTIHANCE GADOBENATE DIMEGLUMINE 529 MG/ML IV SOLN COMPARISON:  None. FINDINGS: INTRACRANIAL  CONTENTS: 13 x 9 mm area of reduced  diffusion RIGHT thalamus with low ADC values, localized FLAIR T2 hyperintense signal, faint peripheral enhancement. Minimal additional white matter changes compatible chronic small vessel ischemic disease. 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 No midline shift, mass effect. No abnormal extra-axial fluid collections nor abnormal extra-axial enhancement. VASCULAR: Normal major intracranial vascular flow voids present at skull base. SKULL AND UPPER CERVICAL SPINE: No abnormal sellar expansion. No suspicious calvarial bone marrow signal. Craniocervical junction maintained. SINUSES/ORBITS: The mastoid air-cells and included paranasal sinuses are well-aerated.The included ocular globes and orbital contents are non-suspicious. OTHER: None. IMPRESSION: 13 x 9 mm acute thalamic infarct. Enhancement pattern is somewhat atypical and, follow-up MRI is recommended in 1 month to verify improvement. Otherwise negative MRI of the head with and without contrast. Electronically Signed   By: Elon Alas M.D.   On: 05/10/2016 20:55    Chart has been reviewed    Assessment/Plan  65 y.o. female with medical history significant of HTN, DM 2  hypothyroidism, shingles, history of OSA admitted for acute CVA  Present on Admission: . Essential hypertension allow permissive hypertension for tonight . CVA (cerebral vascular accident) ([place]) -  - will admit based on  CVA protocol, await results of  Carotid Doppler and Echo, obtain cardiac enzymes,  ECG,   Lipid panel, TSH. Order PT/OT evaluation. Will make sure patient is on antiplatelet agent.   Neurology consulted.     Hypothyroidism we'll continue home medication check  TSH DM 2 poorly controlled will order sliding scale, hold by mouth medications  Other plan as per orders.  DVT prophylaxis:  SCD     Code Status:  FULL CODE  as per patient    Family Communication:    Family  at  Bedside  plan of care was discussed with   Husband,    Disposition Plan:     To home once workup is complete and patient is stable                         Would benefit from PT/OT eval prior to DC   ordered                                              Consults called: neurology     Admission status:    inpatient     Level of care     tele           I have spent a total of 56 min on this admission   Caria Transue 05/10/2016, 10:39 PM    [facility]  Pager [phone]   after 2 AM please page floor coverage PA If 7AM-7PM, please contact the day team taking care of the patient  [email or web address]  Password [email or web address]

## 2016-05-10 NOTE — ED Provider Notes (Signed)
[facility] DEPT Provider Note   CSN: [id number] Arrival date & time: 05/10/16  1638     History   Chief Complaint Chief Complaint  Patient presents with  . Numbness    HPI Joanna [name] is a 65 y.o. female.  Patient is a 65 year old female with past medical history diabetes. She presents for evaluation of left arm numbness. This began yesterday evening and has persisted throughout the day. She reports having a hard time holding onto objects and decreased sensation. She stopped today at the pharmacy and checked her blood pressure, but reports being unable to feel the cuff inflate on her arm. She does report some facial numbness and slight numbness to her leg as well. She denies any injury or trauma. She denies any fevers or chills.   The history is provided by the patient.    Past Medical History:  Diagnosis Date  . Adenomatous colon polyp   . Diabetes mellitus    type II--ORAL MEDICATION - NO INSULIN  . Diverticulitis of colon   . Foot injury 08   Left foot/leg with ?? torn muscle -PROBLEM HAS RESOLVED  . Hand pain 1/09   steroid injections--RESOLVED  . Hx of renal calculi    LEFT  . Hyperlipidemia   . Hypertension   . Hypothyroidism    pt states hx of thyroid nodules  . IBS (irritable bowel syndrome)   . Internal hemorrhoids   . Sleep apnea    USES CPAP - SETTING IS 14    Patient Active Problem List   Diagnosis Date Noted  . Uncontrolled type 2 diabetes mellitus with hyperglycemia, without long-term current use of insulin ([place]) 04/25/2016  . Acute upper respiratory infection 05/03/2015  . Familial hypercholesterolemia 03/24/2014  . Acquired autoimmune hypothyroidism 03/24/2014  . Hair loss 02/16/2014  . Elevated LFTs 02/16/2014  . Perianal dermatitis 04/30/2013  . Hemorrhoids, internal, with bleeding and grade 2 prolapse 04/30/2013  . Diverticulitis of colon (without mention of hemorrhage)(562.11) 04/30/2013  . Chronic constipation 04/30/2013  . Personal  history of failed moderate sedation 04/30/2013  . Renal calculi 02/18/2013  . Anxiety 02/23/2011  . HYPERTENSION 06/28/2007  . DERMATITIS, CONTACT, NOS 10/03/2006  . Poorly controlled type 2 diabetes mellitus ([place]) 09/18/2006  . HYPERLIPIDEMIA 09/18/2006  . Obstructive sleep apnea 09/18/2006  . Personal history of colonic adenomas 05/19/2005    Past Surgical History:  Procedure Laterality Date  . COLONOSCOPY W/ BIOPSIES AND POLYPECTOMY  05/19/2005   adenomatous polyps  . NEPHROLITHOTOMY Left 01/27/2013   Procedure: NEPHROLITHOTOMY PERCUTANEOUS;  Surgeon: Dutch Gray, MD;  Location: [facility];  Service: Urology;  Laterality: Left;  . TUBAL LIGATION    . WISDOM TEETH EXTRACTED      OB History    No data available       Home Medications    Prior to Admission medications   Medication Sig Start Date End Date Taking? Authorizing Provider  Albiglutide (TANZEUM) 50 MG PEN INJECT THE CONTENTS OF ONE PEN ONCE PER WEEK 09/14/15   Elayne Snare, MD  albuterol (PROVENTIL HFA;VENTOLIN HFA) 108 (90 BASE) MCG/ACT inhaler Inhale 2 puffs into the lungs every 6 (six) hours as needed. 05/03/15   [name], MD  chlorpheniramine-HYDROcodone (TUSSIONEX PENNKINETIC ER) 10-8 MG/5ML SUER Take 5 mLs by mouth every 12 (twelve) hours as needed. Patient not taking: Reported on 04/25/2016 05/03/15   [name], MD  Dulaglutide (TRULICITY) [id number] 0000000 SOPN Inject 0.75 weekly 05/09/16   Elayne Snare, MD  Evolocumab (  REPATHA SURECLICK) [id number] MG/ML SOAJ Inject 140 mg into the skin every 14 (fourteen) days. 04/25/16   Elayne Snare, MD  levothyroxine (SYNTHROID, LEVOTHROID) 112 MCG tablet TAKE 1 TABLET BY MOUTH ONCE A DAY 01/06/16   [name], MD  metFORMIN (GLUCOPHAGE) 1000 MG tablet TAKE 1 TABLET (1,000 MG TOTAL) BY MOUTH 2 (TWO) TIMES DAILY. 04/26/16   Elayne Snare, MD  metFORMIN (GLUCOPHAGE) 1000 MG tablet TAKE 1 TABLET (1,000 MG TOTAL) BY MOUTH 2 (TWO) TIMES DAILY. 05/05/16   Elayne Snare, MD  ONE TOUCH ULTRA TEST test  strip USE TO CHECK BLOOD SUGAR TWICE DAILY 01/27/16   Elayne Snare, MD  ramipril (ALTACE) 2.5 MG capsule TAKE 1 CAPSULE (2.5 MG TOTAL) BY MOUTH DAILY. 04/26/16   Elayne Snare, MD  ramipril (ALTACE) 2.5 MG capsule TAKE 1 CAPSULE (2.5 MG TOTAL) BY MOUTH DAILY. 05/05/16   Elayne Snare, MD    Family History Family History  Problem Relation Age of Onset  . Hyperlipidemia Mother   . Thyroid disease Daughter   . Diabetes Neg Hx     Social History Social History  Substance Use Topics  . Smoking status: Never Smoker  . Smokeless tobacco: Never Used  . Alcohol use No     Allergies   Crestor [rosuvastatin]; Lipitor [atorvastatin]; Niacin; Pravachol [pravastatin sodium]; Pravastatin sodium; Sulfonamide derivatives; and Zocor [simvastatin]   Review of Systems Review of Systems  All other systems reviewed and are negative.    Physical Exam Updated Vital Signs BP 164/97   Pulse 67   Temp 97.9 F (36.6 C) (Oral)   Resp 10   SpO2 98%   Physical Exam  Constitutional: She is oriented to person, place, and time. She appears well-developed and well-nourished. No distress.  HENT:  Head: Normocephalic and atraumatic.  Eyes: EOM are normal. Pupils are equal, round, and reactive to light.  Neck: Normal range of motion. Neck supple.  Cardiovascular: Normal rate and regular rhythm.  Exam reveals no gallop and no friction rub.   No murmur heard. Pulmonary/Chest: Effort normal and breath sounds normal. No respiratory distress. She has no wheezes.  Abdominal: Soft. Bowel sounds are normal. She exhibits no distension. There is no tenderness.  Musculoskeletal: Normal range of motion.  Neurological: She is alert and oriented to person, place, and time. No cranial nerve deficit. She exhibits normal muscle tone. Coordination normal.  Skin: Skin is warm and dry. She is not diaphoretic.  Nursing note and vitals reviewed.    ED Treatments / Results  Labs (all labs ordered are listed, but only abnormal  results are displayed) Labs Reviewed  COMPREHENSIVE METABOLIC PANEL - Abnormal; Notable for the following:       Result Value   Chloride 100 (*)    Glucose, Bld 112 (*)    AST 43 (*)    ALT 70 (*)    All other components within normal limits  CBG MONITORING, ED - Abnormal; Notable for the following:    Glucose-Capillary 110 (*)    All other components within normal limits  PROTIME-INR  APTT  CBC  DIFFERENTIAL  URINALYSIS, ROUTINE W REFLEX MICROSCOPIC  I-STAT TROPOININ, ED    EKG  EKG Interpretation  Date/Time:  Wednesday May 10 2016 16:48:30 EST Ventricular Rate:  80 PR Interval:  186 QRS Duration: 84 QT Interval:  394 QTC Calculation: 454 R Axis:   81 Text Interpretation:  Sinus rhythm with Premature atrial complexes Cannot rule out Anterior infarct , age undetermined Abnormal ECG  Confirmed by Stark Jock  MD, Macauley Mossberg ([id number]) on 05/10/2016 6:42:20 PM       Radiology No results found.  Procedures Procedures (including critical care time)  Medications Ordered in ED Medications - No data to display   Initial Impression / Assessment and Plan / ED Course  I have reviewed the triage vital signs and the nursing notes.  Pertinent labs & imaging results that were available during my care of the patient were reviewed by me and considered in my medical decision making (see chart for details).  Clinical Course     Workup reveals a thalamic stroke that appears acute. I discussed this finding with Dr. Nicole Kindred from neurology is recommending admission. Dr. Roel Cluck to admit.  Final Clinical Impressions(s) / ED Diagnoses   Final diagnoses:  None    New Prescriptions New Prescriptions   No medications on file     Veryl Speak, MD 05/10/16 2208

## 2016-05-10 NOTE — ED Notes (Signed)
Pt off to MRI

## 2016-05-11 ENCOUNTER — Inpatient Hospital Stay (HOSPITAL_COMMUNITY): Payer: Commercial Managed Care - HMO

## 2016-05-11 ENCOUNTER — Other Ambulatory Visit: Payer: Self-pay | Admitting: Neurology

## 2016-05-11 ENCOUNTER — Encounter (HOSPITAL_COMMUNITY): Payer: Self-pay | Admitting: Radiology

## 2016-05-11 DIAGNOSIS — I639 Cerebral infarction, unspecified: Secondary | ICD-10-CM | POA: Diagnosis present

## 2016-05-11 DIAGNOSIS — I6789 Other cerebrovascular disease: Secondary | ICD-10-CM

## 2016-05-11 DIAGNOSIS — I6381 Other cerebral infarction due to occlusion or stenosis of small artery: Secondary | ICD-10-CM | POA: Diagnosis present

## 2016-05-11 DIAGNOSIS — I63331 Cerebral infarction due to thrombosis of right posterior cerebral artery: Secondary | ICD-10-CM

## 2016-05-11 LAB — ECHOCARDIOGRAM COMPLETE
EERAT: 18.89
EWDT: 285 ms
FS: 42 % (ref 28–44)
Height: 64 in
IVS/LV PW RATIO, ED: 0.56
LA ID, A-P, ES: 39 mm
LA diam end sys: 39 mm
LA diam index: 2.12 cm/m2
LA vol A4C: 39.9 ml
LA vol index: 25.6 mL/m2
LA vol: 47.1 mL
LV E/e'average: 18.89
LV TDI E'LATERAL: 5.77
LV TDI E'MEDIAL: 6.09
LV e' LATERAL: 5.77 cm/s
LVEEMED: 18.89
LVOT area: 3.46 cm2
LVOT diameter: 21 mm
Lateral S' vel: 9.79 cm/s
MV Dec: 285
MV pk E vel: 109 m/s
MVPG: 5 mmHg
MVPKAVEL: 126 m/s
PW: 16 mm — AB (ref 0.6–1.1)
Weight: 2754.87 oz

## 2016-05-11 LAB — LIPID PANEL
CHOL/HDL RATIO: 7.7 ratio
CHOLESTEROL: 255 mg/dL — AB (ref 0–200)
HDL: 33 mg/dL — ABNORMAL LOW (ref >40)
LDL CALC: 180 mg/dL — AB (ref 0–99)
Triglycerides: 210 mg/dL — ABNORMAL HIGH (ref <150)
VLDL: 42 mg/dL — AB (ref 0–40)

## 2016-05-11 LAB — TROPONIN I: Troponin I: <0.03 ng/mL (ref <0.03)

## 2016-05-11 LAB — GLUCOSE, CAPILLARY
Glucose-Capillary: 127 mg/dL — ABNORMAL HIGH (ref 65–99)
Glucose-Capillary: 135 mg/dL — ABNORMAL HIGH (ref 65–99)
Glucose-Capillary: 145 mg/dL — ABNORMAL HIGH (ref 65–99)

## 2016-05-11 MED ORDER — CYCLOBENZAPRINE HCL 10 MG PO TABS
10.0000 mg | ORAL_TABLET | Freq: Once | ORAL | Status: AC
  Administered 2016-05-11: 10 mg via ORAL
  Filled 2016-05-11: qty 1

## 2016-05-11 MED ORDER — HEPARIN SODIUM (PORCINE) 5000 UNIT/ML IJ SOLN: 5000.0000 [IU] | Freq: Three times a day (TID) | INTRAMUSCULAR | Status: DC

## 2016-05-11 MED ORDER — ASPIRIN EC 81 MG PO TBEC: 81.0000 mg | DELAYED_RELEASE_TABLET | Freq: Every day | ORAL | 0 refills | Status: AC

## 2016-05-11 MED ORDER — PERFLUTREN LIPID MICROSPHERE
1.0000 mL | INTRAVENOUS | Status: AC | PRN
  Administered 2016-05-11: 2 mL via INTRAVENOUS
  Filled 2016-05-11: qty 10

## 2016-05-11 MED ORDER — IOPAMIDOL (ISOVUE-370) INJECTION 76%
INTRAVENOUS | Status: AC
  Administered 2016-05-11: 50 mL
  Filled 2016-05-11: qty 50

## 2016-05-11 NOTE — Discharge Instructions (Signed)
Recommendations for Outpatient Follow-up:  1. Follow up with PCP in 1-2 weeks. Would recommend starting prior authorization for PCSK9 due to patient's high LDL level and intolerance to many statin.  2. Follow up with Neurology in 6-8 weeks. Ambulatory referral placed.

## 2016-05-11 NOTE — Evaluation (Signed)
Physical Therapy Evaluation and Discharge  Patient Details Name: Joanna [name] MRN: [id number] DOB: 1951-01-31 Today's Date: 05/11/2016   History of Present Illness  65 y.o.femalewith medical history significant of HTN, DM 2, shingles, history of OSA, Rt ankle fx 08/2015 presented with Lt sided numbness. MRI Rt 13 x 9 mm acute thalamic infarct  Clinical Impression  Patient evaluated by Physical Therapy with no further acute PT needs identified. All education has been completed and the patient has no further questions. PT is signing off. Thank you for this referral.     Follow Up Recommendations No PT follow up    Equipment Recommendations  None recommended by PT    Recommendations for Other Services       Precautions / Restrictions Precautions Precautions: None Restrictions Weight Bearing Restrictions: No      Mobility  Bed Mobility Overal bed mobility: Independent                Transfers Overall transfer level: Independent                  Ambulation/Gait Ambulation/Gait assistance: Independent Ambulation Distance (Feet): 75 Feet Assistive device: None Gait Pattern/deviations: Step-through pattern;Decreased stance time - right Gait velocity: decr   General Gait Details: limp from recent reinjury of ankle fx  Stairs            Wheelchair Mobility    Modified Rankin (Stroke Patients Only) Modified Rankin (Stroke Patients Only) Pre-Morbid Rankin Score: No symptoms Modified Rankin: No symptoms     Balance Overall balance assessment: Independent               Single Leg Stance - Right Leg: 25 Single Leg Stance - Left Leg: 30     Rhomberg - Eyes Opened: 30 Rhomberg - Eyes Closed: 30 (incr lt sway x 1 with independent recovery eyes closed)                 Pertinent Vitals/Pain Pain Assessment: No/denies pain    Home Living Family/patient expects to be discharged to:: Private residence Living Arrangements:  Spouse/significant other   Type of Home: House Home Access: Stairs to enter Entrance Stairs-Rails: None Technical brewer of Steps: 2 Home Layout: One level Home Equipment: Crutches;Walker - 2 wheels (knee scooter (recent ankle fx))      Prior Function Level of Independence: Independent         Comments: Actuary        Extremity/Trunk Assessment   Upper Extremity Assessment Upper Extremity Assessment: Defer to OT evaluation    Lower Extremity Assessment Lower Extremity Assessment: Overall WFL for tasks assessed    Cervical / Trunk Assessment Cervical / Trunk Assessment: Normal  Communication   Communication: No difficulties  Cognition Arousal/Alertness: Awake/alert Behavior During Therapy: WFL for tasks assessed/performed Overall Cognitive Status: Within Functional Limits for tasks assessed                      General Comments General comments (skin integrity, edema, etc.): Patient had no PT after ankle fx and with questions re: strengthening ankle. Reviewed heelraises (single leg), using theraband for DF, walking or exercise equipment (stepper, eliptical, cycle). Educated on s/s of CVA, calling EMS (she drove herself to hospital), and "time is brain"    Exercises     Assessment/Plan    PT Assessment Patent does not need any further PT services  PT Problem List  PT Treatment Interventions      PT Goals (Current goals can be found in the Care Plan section)       Frequency     Barriers to discharge        Co-evaluation               End of Session   Activity Tolerance: Patient tolerated treatment well Patient left: in bed;with call bell/phone within reach;with bed alarm set;with family/visitor present Nurse Communication: Mobility status (does not manage IV pole well, but otherwise stable)         Time: [id number] PT Time Calculation (min) (ACUTE ONLY): 26 min   Charges:      PT Treatments $Therapeutic Exercise: 8-22 mins   PT G CodesJeanie Cooks Valine Drozdowski May 14, 2016, 9:15 AM Pager [phone]

## 2016-05-11 NOTE — Evaluation (Signed)
Speech Language Pathology Evaluation Patient Details Name: Joanna [name] MRN: [id number] DOB: 05-31-1950 Today's Date: 05/11/2016 Time: [id number] SLP Time Calculation (min) (ACUTE ONLY): 18 min  Problem List:  Patient Active Problem List   Diagnosis Date Noted  . Acute thalamic infarction ([place]) 05/11/2016  . CVA (cerebral vascular accident) ([place]) 05/10/2016  . DM (diabetes mellitus), type 2 ([place]) 05/10/2016  . Uncontrolled type 2 diabetes mellitus with hyperglycemia, without long-term current use of insulin ([place]) 04/25/2016  . Familial hypercholesterolemia 03/24/2014  . Acquired autoimmune hypothyroidism 03/24/2014  . Hair loss 02/16/2014  . Elevated LFTs 02/16/2014  . Perianal dermatitis 04/30/2013  . Hemorrhoids, internal, with bleeding and grade 2 prolapse 04/30/2013  . Diverticulitis of colon (without mention of hemorrhage)(562.11) 04/30/2013  . Chronic constipation 04/30/2013  . Personal history of failed moderate sedation 04/30/2013  . Renal calculi 02/18/2013  . Anxiety 02/23/2011  . Essential hypertension 06/28/2007  . DERMATITIS, CONTACT, NOS 10/03/2006  . Poorly controlled type 2 diabetes mellitus ([place]) 09/18/2006  . HYPERLIPIDEMIA 09/18/2006  . Obstructive sleep apnea 09/18/2006  . Personal history of colonic adenomas 05/19/2005   Past Medical History:  Past Medical History:  Diagnosis Date  . Adenomatous colon polyp   . Diabetes mellitus    type II--ORAL MEDICATION - NO INSULIN  . Diverticulitis of colon   . Foot injury 08   Left foot/leg with ?? torn muscle -PROBLEM HAS RESOLVED  . Hand pain 1/09   steroid injections--RESOLVED  . Hx of renal calculi    LEFT  . Hyperlipidemia   . Hypertension   . Hypothyroidism    pt states hx of thyroid nodules  . IBS (irritable bowel syndrome)   . Internal hemorrhoids   . Sleep apnea    USES CPAP - SETTING IS 14   Past Surgical History:  Past Surgical History:  Procedure Laterality Date  . COLONOSCOPY W/ BIOPSIES  AND POLYPECTOMY  05/19/2005   adenomatous polyps  . NEPHROLITHOTOMY Left 01/27/2013   Procedure: NEPHROLITHOTOMY PERCUTANEOUS;  Surgeon: Dutch Gray, MD;  Location: [facility];  Service: Urology;  Laterality: Left;  . TUBAL LIGATION    . WISDOM TEETH EXTRACTED     HPI:  65 year old female admitted 05/10/16 due to facial numbness and left weakness. PMH significant for HTN, DM2, hypothyroid, shingles, OSA. SLE ordered to evaluate cog/com status due to MRI revealing 13x25mm acute thalamic infarct.   Assessment / Plan / Recommendation Clinical Impression  The Montreal Cognitive Assessment (MoCA) was administered. Pt scored 30/30 (n=26+/30), indicating cognition WFL for this assessment and pt level of education. No points were lost on any subtest. No further ST intervention recommended at this time, however, pt was encouraged to notify PCP if difficulty arises after return to normal routine. ST signing off. Please reconsult if needs arise.     SLP Assessment  Patient does not need any further Speech Lanaguage Pathology Services    Follow Up Recommendations    n/a   Frequency and Duration   n/a        SLP Evaluation Cognition  Overall Cognitive Status: Within Functional Limits for tasks assessed Arousal/Alertness: Awake/alert Orientation Level: Oriented X4 Attention: Focused;Sustained;Selective Focused Attention: Appears intact Sustained Attention: Appears intact Selective Attention: Appears intact Memory: Appears intact Awareness: Appears intact Problem Solving: Appears intact Safety/Judgment: Appears intact       Comprehension  Auditory Comprehension Overall Auditory Comprehension: Appears within functional limits for tasks assessed    Expression Expression Primary Mode of Expression: Verbal Verbal  Expression Overall Verbal Expression: Appears within functional limits for tasks assessed Written Expression Dominant Hand: Right   Oral / Motor  Oral Motor/Sensory Function Overall  Oral Motor/Sensory Function: Within functional limits Motor Speech Overall Motor Speech: Appears within functional limits for tasks assessed   GO                   Celia B. [place], [facility], CCC-SLP [id number]  Shonna Chock 05/11/2016, 10:59 AM

## 2016-05-11 NOTE — Progress Notes (Signed)
OT Cancellation Note  Patient Details Name: Joanna [name] MRN: [id number] DOB: 04/07/1951   Cancelled Treatment:    Reason Eval/Treat Not Completed: Patient at procedure or test/ unavailable. Pt is currently in echo lab--will re-attempt eval at a later time.  [name] [id number] 05/11/2016, 2:07 PM

## 2016-05-11 NOTE — Progress Notes (Signed)
PROGRESS NOTE    Joanna [name]  [id number] DOB: 26-May-1950 DOA: 05/10/2016 PCP: Arnette Norris, MD     Brief Narrative:  Joanna [name] is a 65 y.o. female with medical history significant of HTN, DM 2  hypothyroidism, shingles, history of OSA, who presented with left arm numbness. She was at the pharmacy to pick up some medications and checked her blood pressure and was in the systolic of [id number]. Patient was evaluated in the ED, was admitted for CVA workup  Assessment & Plan:   Principal Problem:   Acute thalamic infarction [facility]) Active Problems:   Essential hypertension   Obstructive sleep apnea   Acquired autoimmune hypothyroidism   CVA (cerebral vascular accident) ([place])   DM (diabetes mellitus), type 2 ([place])  Acute thalamic infarct -Stroke team following -PT/OT/SLP -CTA head/neck pending  -Echo pending   -LDL 180. Has been intolerant to various statins in the past  -Ha1c 7.5  -Aspirin daily   Essential hypertension -Well controlled currently   Diabetes type II -Ha1c 7.5  -SSI   Hypothyroidism -Continue synthroid  OSA -CPAP qhs     DVT prophylaxis: subq hep Code Status: Full Family Communication: spouse at bedside  Disposition Plan: pending further work up, likely discharge back home with spouse    Consultants:   Neurology   Procedures:   None  Antimicrobials:   None    Subjective: Patient admits to left arm numbness. This has mostly resolved on my exam. She has no other complaints including visual complaints, slurred speech, confusion, focal weakness. She denies any chest pains, shortness of breath, nausea or vomiting.  Objective: Vitals:   05/10/16 2345 05/11/16 0130 05/11/16 0330 05/11/16 0530  BP:  (!) 157/81 (!) 120/95 128/83  Pulse:  68 87 (!) 59  Resp:  16 18 20   Temp:  97.7 F (36.5 C) 98 F (36.7 C) 97.9 F (36.6 C)  TempSrc:  Axillary Axillary Axillary  SpO2: 97% 98% 98% 98%  Weight:      Height:        Intake/Output Summary  (Last 24 hours) at 05/11/16 1028 Last data filed at 05/11/16 0200  Gross per 24 hour  Intake            102.5 ml  Output                0 ml  Net            102.5 ml   Filed Weights   05/10/16 2330  Weight: 78.1 kg (172 lb 2.9 oz)    Examination:  General exam: Appears calm and comfortable  Respiratory system: Clear to auscultation. Respiratory effort normal. Cardiovascular system: S1 & S2 heard, RRR. No JVD, murmurs, rubs, gallops or clicks. No pedal edema. Gastrointestinal system: Abdomen is nondistended, soft and nontender. No organomegaly or masses felt. Normal bowel sounds heard. Central nervous system: Alert and oriented. No focal neurological deficits. Extremities: Symmetric 5 x 5 power. Skin: No rashes, lesions or ulcers Psychiatry: Judgement and insight appear normal. Mood & affect appropriate.   Data Reviewed: I have personally reviewed following labs and imaging studies  CBC:  Recent Labs Lab 05/10/16 1650  WBC 6.6  NEUTROABS 3.3  HGB 14.1  HCT 41.2  MCV 86.6  PLT 0000000   Basic Metabolic Panel:  Recent Labs Lab 05/10/16 1650  NA 136  K 4.1  CL 100*  CO2 25  GLUCOSE 112*  BUN 12  CREATININE 0.64  CALCIUM 9.9  GFR: Estimated Creatinine Clearance: 72.8 mL/min (by C-G formula based on SCr of 0.64 mg/dL). Liver Function Tests:  Recent Labs Lab 05/10/16 1650  AST 43*  ALT 70*  ALKPHOS 62  BILITOT 0.4  PROT 7.3  ALBUMIN 4.5   No results for input(s): LIPASE, AMYLASE in the last 168 hours. No results for input(s): AMMONIA in the last 168 hours. Coagulation Profile:  Recent Labs Lab 05/10/16 1650  INR 0.98   Cardiac Enzymes:  Recent Labs Lab 05/11/16 0146  TROPONINI <0.03   BNP (last 3 results) No results for input(s): PROBNP in the last 8760 hours. HbA1C: No results for input(s): HGBA1C in the last 72 hours. CBG:  Recent Labs Lab 05/10/16 1648 05/11/16 0007 05/11/16 0724  GLUCAP 110* 127* 145*   Lipid Profile:  Recent  Labs  05/11/16 0146  CHOL 255*  HDL 33*  LDLCALC 180*  TRIG 210*  CHOLHDL 7.7   Thyroid Function Tests: No results for input(s): TSH, T4TOTAL, FREET4, T3FREE, THYROIDAB in the last 72 hours. Anemia Panel: No results for input(s): VITAMINB12, FOLATE, FERRITIN, TIBC, IRON, RETICCTPCT in the last 72 hours. Sepsis Labs: No results for input(s): PROCALCITON, LATICACIDVEN in the last 168 hours.  No results found for this or any previous visit (from the past 240 hour(s)).     Radiology Studies: Dg Chest 2 View  Result Date: 05/11/2016 CLINICAL DATA:  Initial evaluation for acute TIA EXAM: CHEST  2 VIEW COMPARISON:  Prior radiograph from 01/14/2013. FINDINGS: The cardiac and mediastinal silhouettes are stable in size and contour, and remain within normal limits. Aortic atherosclerosis noted. The lungs are normally inflated. No airspace consolidation, pleural effusion, or pulmonary edema is identified. There is no pneumothorax. No acute osseous abnormality identified. IMPRESSION: 1. No active cardiopulmonary disease. 2. Aortic atherosclerosis. Electronically Signed   By: Jeannine Boga M.D.   On: 05/11/2016 02:28   Mr Jeri Cos [id number] Contrast  Result Date: 05/10/2016 CLINICAL DATA:  Facial numbness. LEFT jaw numbness beginning at midnight, diarrhea and LEFT-sided weakness. Hypertensive. History of diabetes, hypertension, hyperlipidemia. EXAM: MRI HEAD WITHOUT AND WITH CONTRAST TECHNIQUE: Multiplanar, multiecho pulse sequences of the brain and surrounding structures were obtained without and with intravenous contrast. CONTRAST:  57mL MULTIHANCE GADOBENATE DIMEGLUMINE 529 MG/ML IV SOLN COMPARISON:  None. FINDINGS: INTRACRANIAL CONTENTS: 13 x 9 mm area of reduced diffusion RIGHT thalamus with low ADC values, localized FLAIR T2 hyperintense signal, faint peripheral enhancement. Minimal additional white matter changes compatible chronic small vessel ischemic disease. 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 No midline shift, mass effect. No abnormal extra-axial fluid collections nor abnormal extra-axial enhancement. VASCULAR: Normal major intracranial vascular flow voids present at skull base. SKULL AND UPPER CERVICAL SPINE: No abnormal sellar expansion. No suspicious calvarial bone marrow signal. Craniocervical junction maintained. SINUSES/ORBITS: The mastoid air-cells and included paranasal sinuses are well-aerated.The included ocular globes and orbital contents are non-suspicious. OTHER: None. IMPRESSION: 13 x 9 mm acute thalamic infarct. Enhancement pattern is somewhat atypical and, follow-up MRI is recommended in 1 month to verify improvement. Otherwise negative MRI of the head with and without contrast. Electronically Signed   By: Elon Alas M.D.   On: 05/10/2016 20:55      Scheduled Meds: . aspirin  300 mg Rectal Daily   Or  . aspirin  325 mg Oral Daily  . heparin  5,000 Units Subcutaneous Q8H  . insulin aspart  0-15 Units Subcutaneous TID WC  .  insulin aspart  0-5 Units Subcutaneous QHS  . levothyroxine  112 mcg Oral QAC breakfast   Continuous Infusions:    LOS: 1 day    Time spent: 40 minutes   Dessa Phi, DO [facility] [email or web address] Password [facility] 05/11/2016, 10:28 AM

## 2016-05-11 NOTE — Progress Notes (Signed)
  Echocardiogram 2D Echocardiogram with Definity has been performed.  Diamond Nickel 05/11/2016, 1:37 PM

## 2016-05-11 NOTE — Consult Note (Signed)
STROKE TEAM CONSULT NOTE   HPI: Joanna [name] is a 65 y.o. female with medical history significant of HTN, DM, HLD and OSA was admitted for left facial numbness yesterday. Yesterday she stated to feel whole left side numbness, not improve over the day and she decided to come to ED for evaluation. She also felt some weakness at left hand. Daughter stated that her DM and HTN not in good control at home. Pt denies visual changes, HA, LOC, fall or seizure. MRI brain showed right thalamic infarct. Overnight, her numbness started to getting better. Neurology called for consultation due to stroke.   Past Medical History:  Diagnosis Date  . Adenomatous colon polyp   . Diabetes mellitus    type II--ORAL MEDICATION - NO INSULIN  . Diverticulitis of colon   . Foot injury 08   Left foot/leg with ?? torn muscle -PROBLEM HAS RESOLVED  . Hand pain 1/09   steroid injections--RESOLVED  . Hx of renal calculi    LEFT  . Hyperlipidemia   . Hypertension   . Hypothyroidism    pt states hx of thyroid nodules  . IBS (irritable bowel syndrome)   . Internal hemorrhoids   . Sleep apnea    USES CPAP - SETTING IS 14    Past Surgical History:  Procedure Laterality Date  . COLONOSCOPY W/ BIOPSIES AND POLYPECTOMY  05/19/2005   adenomatous polyps  . NEPHROLITHOTOMY Left 01/27/2013   Procedure: NEPHROLITHOTOMY PERCUTANEOUS;  Surgeon: Dutch Gray, MD;  Location: [facility];  Service: Urology;  Laterality: Left;  . TUBAL LIGATION    . WISDOM TEETH EXTRACTED      Family History  Problem Relation Age of Onset  . Hyperlipidemia Mother   . Thyroid disease Daughter   . Diabetes Neg Hx   . Stroke Neg Hx    Social History:  reports that she has never smoked. She has never used smokeless tobacco. She reports that she does not drink alcohol or use drugs.  Allergies:  Allergies  Allergen Reactions  . Crestor [Rosuvastatin]     REACTION: N/V and heartburn  . Lipitor [Atorvastatin]     REACTION: Hot flashes and  flu-like symptoms  . Niacin     REACTION: n/v  . Pravastatin Sodium     REACTION: Neck swelling and pain in her shoulders and arms.  . Sulfonamide Derivatives     Rxn Unknown  . Zocor [Simvastatin]     REACTION: GI    Medications:   Current Facility-Administered Medications:  .  acetaminophen (TYLENOL) tablet 650 mg, 650 mg, Oral, Q4H PRN **OR** acetaminophen (TYLENOL) solution 650 mg, 650 mg, Per Tube, Q4H PRN **OR** acetaminophen (TYLENOL) suppository 650 mg, 650 mg, Rectal, Q4H PRN, Toy Baker, MD .  albuterol (PROVENTIL) (2.5 MG/3ML) 0.083% nebulizer solution 3 mL, 3 mL, Inhalation, Q6H PRN, Toy Baker, MD .  aspirin suppository 300 mg, 300 mg, Rectal, Daily **OR** aspirin tablet 325 mg, 325 mg, Oral, Daily, Toy Baker, MD, 325 mg at 05/11/16 0917 .  heparin injection 5,000 Units, 5,000 Units, Subcutaneous, Q8H, Jennifer Chahn-Yang Choi, DO .  insulin aspart (novoLOG) injection 0-15 Units, 0-15 Units, Subcutaneous, TID WC, Toy Baker, MD, 2 Units at 05/11/16 0724 .  insulin aspart (novoLOG) injection 0-5 Units, 0-5 Units, Subcutaneous, QHS, Anastassia Doutova, MD .  levothyroxine (SYNTHROID, LEVOTHROID) tablet 112 mcg, 112 mcg, Oral, QAC breakfast, Toy Baker, MD, 112 mcg at 05/11/16 0800 .  senna-docusate (Senokot-S) tablet 1 tablet, 1 tablet, Oral, QHS PRN, Nyoka Lint  Doutova, MD  Current Outpatient Prescriptions:  .  albuterol (PROVENTIL HFA;VENTOLIN HFA) 108 (90 BASE) MCG/ACT inhaler, Inhale 2 puffs into the lungs every 6 (six) hours as needed. (Patient taking differently: Inhale 2 puffs into the lungs every 6 (six) hours as needed for wheezing or shortness of breath. ), Disp: 1 Inhaler, Rfl: 0 .  levothyroxine (SYNTHROID, LEVOTHROID) 112 MCG tablet, TAKE 1 TABLET BY MOUTH ONCE A DAY, Disp: 90 tablet, Rfl: 2 .  metFORMIN (GLUCOPHAGE) 1000 MG tablet, TAKE 1 TABLET (1,000 MG TOTAL) BY MOUTH 2 (TWO) TIMES DAILY., Disp: 180 tablet, Rfl: 1 .  ONE  TOUCH ULTRA TEST test strip, USE TO CHECK BLOOD SUGAR TWICE DAILY, Disp: 100 each, Rfl: 3 .  ramipril (ALTACE) 2.5 MG capsule, TAKE 1 CAPSULE (2.5 MG TOTAL) BY MOUTH DAILY., Disp: 90 capsule, Rfl: 1 .  Albiglutide (TANZEUM) 50 MG PEN, INJECT THE CONTENTS OF ONE PEN ONCE PER WEEK, Disp: 4 each, Rfl: 2 .  aspirin EC 81 MG tablet, Take 1 tablet (81 mg total) by mouth daily., Disp: 30 tablet, Rfl: 0 .  Dulaglutide (TRULICITY) [id number] SOPN, Inject 0.75 weekly, Disp: 12 pen, Rfl: 2  ROS: Review of Systems: ROS was attempted today and was able to be performed.  Systems assessed include - Constitutional, Eyes, HENT, Respiratory, Cardiovascular, Gastrointestinal, Genitourinary, Integument/breast, Hematologic/lymphatic, Musculoskeletal, Neurological, Behavioral/Psych, Endocrine,  Allergic/Immunologic - the patient complains of only the following symptoms, and all other reviewed systems are negative.   CBC:  Recent Labs Lab 05/10/16 1650  WBC 6.6  NEUTROABS 3.3  HGB 14.1  HCT 41.2  MCV 86.6  PLT 0000000    Basic Metabolic Panel:  Recent Labs Lab 05/10/16 1650  NA 136  K 4.1  CL 100*  CO2 25  GLUCOSE 112*  BUN 12  CREATININE 0.64  CALCIUM 9.9    Lipid Panel:    Component Value Date/Time   CHOL 255 (H) 05/11/2016 0146   TRIG 210 (H) 05/11/2016 0146   HDL 33 (L) 05/11/2016 0146   CHOLHDL 7.7 05/11/2016 0146   VLDL 42 (H) 05/11/2016 0146   LDLCALC 180 (H) 05/11/2016 0146   HgbA1c:  Lab Results  Component Value Date   HGBA1C 7.5 (H) 04/20/2016   Urine Drug Screen: No results found for: LABOPIA, COCAINSCRNUR, LABBENZ, AMPHETMU, THCU, LABBARB    IMAGING I have personally reviewed the radiological images below and agree with the radiology interpretations.  Dg Chest 2 View 05/11/2016 1. No active cardiopulmonary disease. 2. Aortic atherosclerosis.   Mr Jeri Cos Wo Contrast 05/10/2016 13 x 9 mm acute thalamic infarct. Enhancement pattern is somewhat atypical and, follow-up  MRI is recommended in 1 month  Ct Angio Head and neck W Or Wo Contrast 05/11/2016 IMPRESSION: Acute RIGHT thalamic infarct, without hemorrhage or significant postcontrast enhancement. No extracranial or intracranial carotid artery stenosis. Dominant RIGHT vertebral is free of disease, as is the basilar. Small LEFT vertebral originates directly from the arch, with a high-grade ostial stenosis. Significance is doubtful with regard to the acute infarction, given that this vessel has only a rudimentary or absent connection to the basilar. No significant intracranial atherosclerotic large vessel stenosis, dissection, or visible embolus.   TTE  Left ventricle: The cavity size was normal. Wall thickness was   increased in a pattern of mild LVH. Systolic function was normal.   Wall motion was normal; there were no regional wall motion   abnormalities. Doppler parameters are consistent with abnormal   left ventricular relaxation (grade  1 diastolic dysfunction).   Doppler parameters are consistent with high ventricular filling   pressure. - Aortic valve: Transvalvular velocity was within the normal range.   There was no stenosis. There was no regurgitation. - Mitral valve: Calcified annulus. Transvalvular velocity was   within the normal range. There was no evidence for stenosis.   There was no regurgitation. - Right ventricle: The cavity size was normal. Wall thickness was   normal. Systolic function was normal. - Tricuspid valve: There was trivial regurgitation. - Pulmonary arteries: Systolic pressure was within the normal   range.   PHYSICAL EXAM  Temp:  [97.7 F (36.5 C)-98 F (36.7 C)] 97.9 F (36.6 C) (12/21 0530) Pulse Rate:  [59-87] 59 (12/21 0530) Resp:  [14-20] 20 (12/21 0530) BP: (120-169)/(79-95) 128/83 (12/21 0530) SpO2:  [97 %-99 %] 98 % (12/21 0530) FiO2 (%):  [21 %] 21 % (12/21 0530) Weight:  [172 lb 2.9 oz (78.1 kg)] 172 lb 2.9 oz (78.1 kg) (12/20 2330)  General - Well  nourished, well developed, in no apparent distress.  Ophthalmologic - Sharp disc margins OU.   Cardiovascular - Regular rate and rhythm.  Mental Status -  Level of arousal and orientation to time, place, and person were intact. Language including expression, naming, repetition, comprehension was assessed and found intact. Attention span and concentration were normal. Recent and remote memory were intact. Fund of Knowledge was assessed and was intact.  Cranial Nerves II - XII - II - Visual field intact OU. III, IV, VI - Extraocular movements intact. V - Facial sensation intact bilaterally. VII - Facial movement intact bilaterally. VIII - Hearing & vestibular intact bilaterally. X - Palate elevates symmetrically. XI - Chin turning & shoulder shrug intact bilaterally. XII - Tongue protrusion intact.  Motor Strength - The patient's strength was normal in all extremities and pronator drift was absent.  Bulk was normal and fasciculations were absent.   Motor Tone - Muscle tone was assessed at the neck and appendages and was normal.  Reflexes - The patient's reflexes were 1+ in all extremities and she had no pathological reflexes.  Sensory - Light touch, temperature/pinprick, vibration and proprioception, and Romberg testing were assessed and were symmetrical.    Coordination - The patient had normal movements in the hands and feet with no ataxia or dysmetria.  Tremor was absent.  Gait and Station - The patient's transfers, posture, gait, station, and turns were observed as normal.   ASSESSMENT/PLAN Ms. Joanna [name] is a 65 y.o. female with history of hypertension, type 2 diabetes, hypothyroidism, shingles and obstructive sleep apnea presenting with left face numbness and left body weakness. She did not receive IV t-PA due to delay in arrival.   Stroke:  Right thalamic infarct due to small vessel disease  Resultant  Deficit resolved  MRI  right thalamic infarct, enhancement pattern  somewhat atypical. Follow-up MRI in one month  CT angiogram head and neck  unremarkable  2D Echo  unremarkable  LDL 180  HgbA1c 7.5  Heparin 5000 units sq tid for VTE prophylaxis  Diet Carb Modified Fluid consistency: Thin; Room service appropriate? Yes  No antithrombotic prior to admission, now on aspirin 325 mg daily. Continue aspirin on discharge.  Patient counseled to be compliant with her antithrombotic medications  Ongoing aggressive stroke risk factor management  Therapy recommendations:  No PT  Disposition:  Return home  Hypertension  Stable  Permissive hypertension (OK if <220/120) for 24-48 hours post stroke and then gradually  normalized within 5-7 days. Long-term BP goal normotensive  Hyperlipidemia  Home meds:  No statin, Intolerance to Crestor, Lipitor, Pravachol, Zocor and niacin  LDL 180, goal < 70  Recommended outpatient follow-up with PCP to consider PCSK9 inhibitors.  Diabetes type II  HgbA1c 7.5, goal < 7.0  Uncontrolled  SSI  Has close endocrinology follow-up as outpt  Other Stroke Risk Factors  Overweight, Body mass index is 29.55 kg/m., recommend weight loss, diet and exercise as appropriate   Obstructive sleep apnea, on CPAP at home  Other Active Problems  Hypothyroidism  Hospital day # 1  Neurology will sign off. Please call with questions. Pt will follow up with Cecille Rubin NP at [facility] in about 6 weeks. Thanks for the consult.  Rosalin Hawking, MD PhD Stroke Neurology 05/11/2016 7:14 PM   To contact Stroke Continuity provider, please refer to [email or web address] After hours, contact General Neurology

## 2016-05-11 NOTE — Discharge Summary (Signed)
Physician Discharge Summary  Joanna [name] [id number] DOB: 1951-05-10 DOA: 05/10/2016  PCP: Arnette Norris, MD  Admit date: 05/10/2016 Discharge date: 05/11/2016  Admitted From: home Disposition:  home  Recommendations for Outpatient Follow-up:  1. Follow up with PCP in 1-2 weeks. Would recommend starting prior authorization for PCSK9 due to patient's high LDL level and intolerance to many statin.  2. Follow up with Neurology in 6-8 weeks. Ambulatory referral placed.   Home Health: No  Equipment/Devices: none   Discharge Condition: stable CODE STATUS: full  Diet recommendation: heart healthy   Brief/Interim Summary: Joanna Gombert Payneis a 65 y.o.femalewith medical history significant of HTN, DM 2 hypothyroidism, shingles, history of OSA, who presented with left arm numbness. She was at the pharmacy to pick up some medications and checked her blood pressure and was in the systolic of [id number]. Patient was evaluated in the ED, was admitted for CVA workup, which revealed acute right thalamic infarct. She was evaluated by stroke team and underwent further workup. Results as below.  Subjective on day of discharge: Patient admits to left arm numbness. This has mostly resolved on my exam. She has no other complaints including visual complaints, slurred speech, confusion, focal weakness. She denies any chest pains, shortness of breath, nausea or vomiting.  Discharge Diagnoses:  Principal Problem:   Acute thalamic infarction [facility]) Active Problems:   Essential hypertension   Obstructive sleep apnea   Acquired autoimmune hypothyroidism   CVA (cerebral vascular accident) ([place])   DM (diabetes mellitus), type 2 ([place])   Acute right thalamic infarct -Stroke team following -PT/OT/SLP - no follow up recommended  -CTA head/neck showed small left vertebral originated directly from arch, with high grade ostial stenosis but likely no significance with regard to right thalamic infarct  -Echo with normal  systolic function, grade 1 diastolic dysfunction  -LDL 180. Has been intolerant to various statins in the past. Will need close follow up with PCP to start PCSK9 prior authorization  -Ha1c 7.5  -Aspirin daily   Essential hypertension -Well controlled currently   Diabetes type II -Ha1c 7.5  -SSI   Hypothyroidism -Continue synthroid  OSA -CPAP qhs    Discharge Instructions  Discharge Instructions    Ambulatory referral to Neurology    Complete by:  As directed    An appointment is requested in approximately: 6 weeks     Allergies as of 05/11/2016      Reactions   Crestor [rosuvastatin]    REACTION: N/V and heartburn   Lipitor [atorvastatin]    REACTION: Hot flashes and flu-like symptoms   Niacin    REACTION: n/v   Pravastatin Sodium    REACTION: Neck swelling and pain in her shoulders and arms.   Sulfonamide Derivatives    Rxn Unknown   Zocor [simvastatin]    REACTION: GI      Medication List    TAKE these medications   Albiglutide 50 MG Pen Commonly known as:  TANZEUM INJECT THE CONTENTS OF ONE PEN ONCE PER WEEK   albuterol 108 (90 Base) MCG/ACT inhaler Commonly known as:  PROVENTIL HFA;VENTOLIN HFA Inhale 2 puffs into the lungs every 6 (six) hours as needed. What changed:  reasons to take this   aspirin EC 81 MG tablet Take 1 tablet (81 mg total) by mouth daily.   Dulaglutide 0.75 MG/0.5ML Sopn Commonly known as:  TRULICITY Inject [id number] weekly   levothyroxine 112 MCG tablet Commonly known as:  SYNTHROID, LEVOTHROID TAKE 1 TABLET BY MOUTH ONCE A  DAY   metFORMIN 1000 MG tablet Commonly known as:  GLUCOPHAGE TAKE 1 TABLET (1,000 MG TOTAL) BY MOUTH 2 (TWO) TIMES DAILY.   ONE TOUCH ULTRA TEST test strip Generic drug:  glucose blood USE TO CHECK BLOOD SUGAR TWICE DAILY   ramipril 2.5 MG capsule Commonly known as:  ALTACE TAKE 1 CAPSULE (2.5 MG TOTAL) BY MOUTH DAILY.      Follow-up Information    Arnette Norris, MD. Schedule an appointment as  soon as possible for a visit in 1 week(s).   Specialty:  Family Medicine Contact information: [place] [address] [phone]        [facility]. Schedule an appointment as soon as possible for a visit in 6 week(s).   Why:  Ambulatory referral placed  Contact information: [address]     [address] [place] [phone]         Allergies  Allergen Reactions  . Crestor [Rosuvastatin]     REACTION: N/V and heartburn  . Lipitor [Atorvastatin]     REACTION: Hot flashes and flu-like symptoms  . Niacin     REACTION: n/v  . Pravastatin Sodium     REACTION: Neck swelling and pain in her shoulders and arms.  . Sulfonamide Derivatives     Rxn Unknown  . Zocor [Simvastatin]     REACTION: GI    Consultations:  Neurology    Procedures/Studies: Ct Angio Head W Or Wo Contrast  Result Date: 05/11/2016 CLINICAL DATA:  LEFT facial numbness and weakness beginning 05/10/2016. Stroke risk factors include hypertension, diabetes, and hyperlipidemia. EXAM: CT ANGIOGRAPHY HEAD AND NECK TECHNIQUE: Multidetector CT imaging of the head and neck was performed using the standard protocol during bolus administration of intravenous contrast. Multiplanar CT image reconstructions and MIPs were obtained to evaluate the vascular anatomy. Carotid stenosis measurements (when applicable) are obtained utilizing NASCET criteria, using the distal internal carotid diameter as the denominator. CONTRAST:  50 mL Isovue 370. COMPARISON:  MR brain 05/10/2016. FINDINGS: CT HEAD Calvarium and skull base: No fracture or destructive lesion. Mastoids and middle ears are grossly clear. Paranasal sinuses: Imaged portions are clear. Orbits: Negative. Brain: Ovoid hypodensity in the RIGHT thalamus, 11 x 14 mm cross-section, consistent with acute infarct. Paucity of postcontrast enhancement is demonstrated on CT. Normal cerebral volume. No white matter  disease, hemorrhage, mass lesion, hydrocephalus, or extra-axial fluid. CTA NECK Aortic arch: Variant branching, with LEFT vertebral directly arising from the arch. There is a calcific plaque at the origin LEFT vertebral contributing to severe stenosis. Imaged portion shows no evidence of aneurysm or dissection. No significant stenosis of the innominate, carotid, or subclavian origins. Right carotid system: No significant atheromatous change at the bifurcation. No evidence of dissection, stenosis (50% or greater) or occlusion. Left carotid system: Mild calcific posterior wall plaque. No evidence of dissection, stenosis (50% or greater) or occlusion. Vertebral arteries: RIGHT vertebral dominant. LEFT vertebral diminutive but patent through the neck. Dominant contribution is to the inferior LEFT cerebellum. High-grade stenosis at the LEFT vertebral origin, variant directly from the arch. Nonvascular soft tissues: Cervical spondylosis. No neck masses. No lung apex lesion or pneumothorax. Unremarkable thyroid. Moderate pannus without cervicomedullary compression. CTA HEAD Anterior circulation: Unremarkable. No significant stenosis, proximal occlusion, aneurysm, or vascular malformation. Posterior circulation: Dominant RIGHT vertebral appears to be the sole contributor to the basilar. LEFT vertebral predominantly supplies the PICA, and has a rudimentary/insignificant connection to the basilar. No significant stenosis, proximal occlusion,  aneurysm, or vascular malformation. Venous sinuses: As permitted by contrast timing, patent. Anatomic variants: Fetal RIGHT PCA. Delayed phase:   No abnormal intracranial enhancement. Review of the MIP images confirms the above findings IMPRESSION: Acute RIGHT thalamic infarct, without hemorrhage or significant postcontrast enhancement. No extracranial or intracranial carotid artery stenosis. Dominant RIGHT vertebral is free of disease, as is the basilar. Small LEFT vertebral originates  directly from the arch, with a high-grade ostial stenosis. Significance is doubtful with regard to the acute infarction, given that this vessel has only a rudimentary or absent connection to the basilar. No significant intracranial atherosclerotic large vessel stenosis, dissection, or visible embolus. Electronically Signed   By: Staci Righter M.D.   On: 05/11/2016 10:44   Dg Chest 2 View  Result Date: 05/11/2016 CLINICAL DATA:  Initial evaluation for acute TIA EXAM: CHEST  2 VIEW COMPARISON:  Prior radiograph from 01/14/2013. FINDINGS: The cardiac and mediastinal silhouettes are stable in size and contour, and remain within normal limits. Aortic atherosclerosis noted. The lungs are normally inflated. No airspace consolidation, pleural effusion, or pulmonary edema is identified. There is no pneumothorax. No acute osseous abnormality identified. IMPRESSION: 1. No active cardiopulmonary disease. 2. Aortic atherosclerosis. Electronically Signed   By: Jeannine Boga M.D.   On: 05/11/2016 02:28   Ct Angio Neck W Or Wo Contrast  Result Date: 05/11/2016 CLINICAL DATA:  LEFT facial numbness and weakness beginning 05/10/2016. Stroke risk factors include hypertension, diabetes, and hyperlipidemia. EXAM: CT ANGIOGRAPHY HEAD AND NECK TECHNIQUE: Multidetector CT imaging of the head and neck was performed using the standard protocol during bolus administration of intravenous contrast. Multiplanar CT image reconstructions and MIPs were obtained to evaluate the vascular anatomy. Carotid stenosis measurements (when applicable) are obtained utilizing NASCET criteria, using the distal internal carotid diameter as the denominator. CONTRAST:  50 mL Isovue 370. COMPARISON:  MR brain 05/10/2016. FINDINGS: CT HEAD Calvarium and skull base: No fracture or destructive lesion. Mastoids and middle ears are grossly clear. Paranasal sinuses: Imaged portions are clear. Orbits: Negative. Brain: Ovoid hypodensity in the RIGHT  thalamus, 11 x 14 mm cross-section, consistent with acute infarct. Paucity of postcontrast enhancement is demonstrated on CT. Normal cerebral volume. No white matter disease, hemorrhage, mass lesion, hydrocephalus, or extra-axial fluid. CTA NECK Aortic arch: Variant branching, with LEFT vertebral directly arising from the arch. There is a calcific plaque at the origin LEFT vertebral contributing to severe stenosis. Imaged portion shows no evidence of aneurysm or dissection. No significant stenosis of the innominate, carotid, or subclavian origins. Right carotid system: No significant atheromatous change at the bifurcation. No evidence of dissection, stenosis (50% or greater) or occlusion. Left carotid system: Mild calcific posterior wall plaque. No evidence of dissection, stenosis (50% or greater) or occlusion. Vertebral arteries: RIGHT vertebral dominant. LEFT vertebral diminutive but patent through the neck. Dominant contribution is to the inferior LEFT cerebellum. High-grade stenosis at the LEFT vertebral origin, variant directly from the arch. Nonvascular soft tissues: Cervical spondylosis. No neck masses. No lung apex lesion or pneumothorax. Unremarkable thyroid. Moderate pannus without cervicomedullary compression. CTA HEAD Anterior circulation: Unremarkable. No significant stenosis, proximal occlusion, aneurysm, or vascular malformation. Posterior circulation: Dominant RIGHT vertebral appears to be the sole contributor to the basilar. LEFT vertebral predominantly supplies the PICA, and has a rudimentary/insignificant connection to the basilar. No significant stenosis, proximal occlusion, aneurysm, or vascular malformation. Venous sinuses: As permitted by contrast timing, patent. Anatomic variants: Fetal RIGHT PCA. Delayed phase:   No abnormal intracranial enhancement. Review  of the MIP images confirms the above findings IMPRESSION: Acute RIGHT thalamic infarct, without hemorrhage or significant postcontrast  enhancement. No extracranial or intracranial carotid artery stenosis. Dominant RIGHT vertebral is free of disease, as is the basilar. Small LEFT vertebral originates directly from the arch, with a high-grade ostial stenosis. Significance is doubtful with regard to the acute infarction, given that this vessel has only a rudimentary or absent connection to the basilar. No significant intracranial atherosclerotic large vessel stenosis, dissection, or visible embolus. Electronically Signed   By: Staci Righter M.D.   On: 05/11/2016 10:44   Mr Jeri Cos [id number] Contrast  Result Date: 05/10/2016 CLINICAL DATA:  Facial numbness. LEFT jaw numbness beginning at midnight, diarrhea and LEFT-sided weakness. Hypertensive. History of diabetes, hypertension, hyperlipidemia. EXAM: MRI HEAD WITHOUT AND WITH CONTRAST TECHNIQUE: Multiplanar, multiecho pulse sequences of the brain and surrounding structures were obtained without and with intravenous contrast. CONTRAST:  23mL MULTIHANCE GADOBENATE DIMEGLUMINE 529 MG/ML IV SOLN COMPARISON:  None. FINDINGS: INTRACRANIAL CONTENTS: 13 x 9 mm area of reduced diffusion RIGHT thalamus with low ADC values, localized FLAIR T2 hyperintense signal, faint peripheral enhancement. Minimal additional white matter changes compatible chronic small vessel ischemic disease. 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 No midline shift, mass effect. No abnormal extra-axial fluid collections nor abnormal extra-axial enhancement. VASCULAR: Normal major intracranial vascular flow voids present at skull base. SKULL AND UPPER CERVICAL SPINE: No abnormal sellar expansion. No suspicious calvarial bone marrow signal. Craniocervical junction maintained. SINUSES/ORBITS: The mastoid air-cells and included paranasal sinuses are well-aerated.The included ocular globes and orbital contents are non-suspicious. OTHER: None.  IMPRESSION: 13 x 9 mm acute thalamic infarct. Enhancement pattern is somewhat atypical and, follow-up MRI is recommended in 1 month to verify improvement. Otherwise negative MRI of the head with and without contrast. Electronically Signed   By: Elon Alas M.D.   On: 05/10/2016 20:55    Echo Study Conclusions - Left ventricle: The cavity size was normal. Wall thickness was   increased in a pattern of mild LVH. Systolic function was normal.   Wall motion was normal; there were no regional wall motion   abnormalities. Doppler parameters are consistent with abnormal   left ventricular relaxation (grade 1 diastolic dysfunction).   Doppler parameters are consistent with high ventricular filling   pressure. - Aortic valve: Transvalvular velocity was within the normal range.   There was no stenosis. There was no regurgitation. - Mitral valve: Calcified annulus. Transvalvular velocity was   within the normal range. There was no evidence for stenosis.   There was no regurgitation. - Right ventricle: The cavity size was normal. Wall thickness was   normal. Systolic function was normal. - Tricuspid valve: There was trivial regurgitation. - Pulmonary arteries: Systolic pressure was within the normal   range.   Discharge Exam: Vitals:   05/11/16 0330 05/11/16 0530  BP: (!) 120/95 128/83  Pulse: 87 (!) 59  Resp: 18 20  Temp: 98 F (36.7 C) 97.9 F (36.6 C)   Vitals:   05/10/16 2345 05/11/16 0130 05/11/16 0330 05/11/16 0530  BP:  (!) 157/81 (!) 120/95 128/83  Pulse:  68 87 (!) 59  Resp:  16 18 20   Temp:  97.7 F (36.5 C) 98 F (36.7 C) 97.9 F (36.6 C)  TempSrc:  Axillary Axillary Axillary  SpO2: 97% 98% 98% 98%  Weight:      Height:  General exam: Appears calm and comfortable  Respiratory system: Clear to auscultation. Respiratory effort normal. Cardiovascular system: S1 & S2 heard, RRR. No JVD, murmurs, rubs, gallops or clicks. No pedal edema. Gastrointestinal system:  Abdomen is nondistended, soft and nontender. No organomegaly or masses felt. Normal bowel sounds heard. Central nervous system: Alert and oriented. No focal neurological deficits. Extremities: Symmetric 5 x 5 power. Skin: No rashes, lesions or ulcers Psychiatry: Judgement and insight appear normal. Mood & affect appropriate.     The results of significant diagnostics from this hospitalization (including imaging, microbiology, ancillary and laboratory) are listed below for reference.     Microbiology: No results found for this or any previous visit (from the past 240 hour(s)).   Labs: BNP (last 3 results) No results for input(s): BNP in the last 8760 hours. Basic Metabolic Panel:  Recent Labs Lab 05/10/16 1650  NA 136  K 4.1  CL 100*  CO2 25  GLUCOSE 112*  BUN 12  CREATININE 0.64  CALCIUM 9.9   Liver Function Tests:  Recent Labs Lab 05/10/16 1650  AST 43*  ALT 70*  ALKPHOS 62  BILITOT 0.4  PROT 7.3  ALBUMIN 4.5   No results for input(s): LIPASE, AMYLASE in the last 168 hours. No results for input(s): AMMONIA in the last 168 hours. CBC:  Recent Labs Lab 05/10/16 1650  WBC 6.6  NEUTROABS 3.3  HGB 14.1  HCT 41.2  MCV 86.6  PLT 258   Cardiac Enzymes:  Recent Labs Lab 05/11/16 0146  TROPONINI <0.03   BNP: Invalid input(s): POCBNP CBG:  Recent Labs Lab 05/10/16 1648 05/11/16 0007 05/11/16 0724 05/11/16 1119  GLUCAP 110* 127* 145* 135*   D-Dimer No results for input(s): DDIMER in the last 72 hours. Hgb A1c No results for input(s): HGBA1C in the last 72 hours. Lipid Profile  Recent Labs  05/11/16 0146  CHOL 255*  HDL 33*  LDLCALC 180*  TRIG 210*  CHOLHDL 7.7   Thyroid function studies No results for input(s): TSH, T4TOTAL, T3FREE, THYROIDAB in the last 72 hours.  Invalid input(s): FREET3 Anemia work up No results for input(s): VITAMINB12, FOLATE, FERRITIN, TIBC, IRON, RETICCTPCT in the last 72 hours. Urinalysis    Component  Value Date/Time   COLORURINE YELLOW 04/22/2013 [place] 04/22/2013 1353   LABSPEC 1.003 (L) 04/22/2013 1353   PHURINE 7.0 04/22/2013 1353   GLUCOSEU NEGATIVE 04/22/2013 1353   HGBUR NEGATIVE 04/22/2013 1353   BILIRUBINUR NEGATIVE 04/22/2013 1353   KETONESUR NEGATIVE 04/22/2013 1353   PROTEINUR NEGATIVE 04/22/2013 1353   UROBILINOGEN 0.2 04/22/2013 1353   NITRITE NEGATIVE 04/22/2013 1353   LEUKOCYTESUR NEGATIVE 04/22/2013 1353   Sepsis Labs Invalid input(s): PROCALCITONIN,  WBC,  LACTICIDVEN Microbiology No results found for this or any previous visit (from the past 240 hour(s)).   Time coordinating discharge: Over 30 minutes  SIGNED:  Dessa Phi, DO [facility] Pager [phone]  If 7PM-7AM, please contact night-coverage [email or web address] Password [facility] 05/11/2016, 3:18 PM

## 2016-05-11 NOTE — Progress Notes (Signed)
Patient discharged home with family. IV and telemetry discontinued. Patient questions asked and answered. Discharge information given. Pt to transport from unit via wheelchair. Wendee Copp

## 2016-05-12 ENCOUNTER — Telehealth: Payer: Self-pay

## 2016-05-12 LAB — HEMOGLOBIN A1C
Hgb A1c MFr Bld: 7.3 % — ABNORMAL HIGH (ref 4.8–5.6)
MEAN PLASMA GLUCOSE: 163 mg/dL

## 2016-05-12 NOTE — Telephone Encounter (Signed)
Pt needs refills on tanzeum [facility] call [facility] the [facility] in [place]

## 2016-05-12 NOTE — Telephone Encounter (Signed)
Transition Care Management Follow-up Telephone Call    Date discharged? 05/11/2016  How have you been since you were released from the hospital? [place].   Any patient concerns? None   Items Reviewed:  Medications reviewed: Yes  Allergies reviewed: Yes  Dietary changes reviewed: heart healthy  Referrals reviewed: [facility] - appt has been scheduled   Functional Questionnaire:  Independent - I Dependent - D    Activities of Daily Living (ADLs):    Personal hygiene - I Dressing - I Eating - I Maintaining continence - I Transferring - I   Independent Activities of Daily Living (iADLs): Basic communication skills - I Transportation - I Meal preparation  - I Shopping - I Housework - I Managing medications - I  Managing personal finances - I   Confirmed importance and date/time of follow-up visits scheduled YES  Provider Appointment booked with Clarene Reamer, NP 05/17/16 @ 1600  Confirmed with patient if condition begins to worsen call PCP or go to the ER.  Patient was given the office number and encouraged to call back with question or concerns: YES

## 2016-05-12 NOTE — Telephone Encounter (Signed)
Pt called back again to get her refill on Tanzium.

## 2016-05-12 NOTE — Telephone Encounter (Signed)
Please disregard the tanzeum request she was mistaken, it is not tanzeum she needs at this time.

## 2016-05-16 ENCOUNTER — Other Ambulatory Visit: Payer: Self-pay

## 2016-05-16 MED ORDER — DULAGLUTIDE 0.75 MG/0.5ML ~~LOC~~ SOAJ: SUBCUTANEOUS | 2 refills | Status: DC

## 2016-05-17 ENCOUNTER — Telehealth: Payer: Self-pay | Admitting: Family Medicine

## 2016-05-17 ENCOUNTER — Ambulatory Visit: Payer: Commercial Managed Care - HMO | Admitting: Family Medicine

## 2016-05-17 NOTE — Telephone Encounter (Signed)
Patient did not come in for their appointment today for [facility] follow up with [facility]   Please let me know if patient needs to be contacted immediately for follow up or no follow up needed.

## 2016-05-18 NOTE — Telephone Encounter (Signed)
Yes please call pt to schedule follow up.

## 2016-05-25 ENCOUNTER — Ambulatory Visit (INDEPENDENT_AMBULATORY_CARE_PROVIDER_SITE_OTHER): Payer: Commercial Managed Care - HMO | Admitting: Family Medicine

## 2016-05-25 ENCOUNTER — Telehealth: Payer: Self-pay | Admitting: *Deleted

## 2016-05-25 ENCOUNTER — Telehealth: Payer: Self-pay | Admitting: Endocrinology

## 2016-05-25 ENCOUNTER — Encounter: Payer: Self-pay | Admitting: Family Medicine

## 2016-05-25 VITALS — BP 122/72 | HR 77 | Temp 98.0°F | Wt 172.2 lb

## 2016-05-25 DIAGNOSIS — E7801 Familial hypercholesterolemia: Secondary | ICD-10-CM

## 2016-05-25 DIAGNOSIS — I63331 Cerebral infarction due to thrombosis of right posterior cerebral artery: Secondary | ICD-10-CM | POA: Diagnosis not present

## 2016-05-25 DIAGNOSIS — E1165 Type 2 diabetes mellitus with hyperglycemia: Secondary | ICD-10-CM

## 2016-05-25 MED ORDER — RAMIPRIL 2.5 MG PO CAPS: ORAL_CAPSULE | ORAL | 1 refills | Status: DC

## 2016-05-25 MED ORDER — GLUCOSE BLOOD VI STRP: ORAL_STRIP | 3 refills | Status: DC

## 2016-05-25 MED ORDER — DULAGLUTIDE 0.75 MG/0.5ML ~~LOC~~ SOAJ: SUBCUTANEOUS | 2 refills | Status: DC

## 2016-05-25 NOTE — Telephone Encounter (Signed)
Pt was seen today at her PCP's office for a hospital f/u, pt advise me that she is suppose to be on Repatha 140 mg prescribed by Dr. Dwyane Dee, but med not on med list and pt said she never received med, pt ? If Rx was sent to [place]., pt request call back from your office regarding receiving this medication,

## 2016-05-25 NOTE — Progress Notes (Signed)
Pre visit review using our clinic review tool, if applicable. No additional management support is needed unless otherwise documented below in the visit note. 

## 2016-05-25 NOTE — Progress Notes (Signed)
Subjective:    Patient ID: Joanna [name], female    DOB: September 10, 1950, 66 y.o.   MRN: [id number]  HPI This is a 66 yo female who presents today for hospital follow up. She was admitted at [facility] from 05/10/16-05/11/16 with right thalamic infarct. Presenting symptom was left arm numbness which had improved prior to discharge. She did not have any additional sypmtoms.  Today she reports feeling fatigued and having continued left hand numbness and some side numbness. This is improving slowly, no impact on ability to perform normal activities.   She broke her left foot last year (4/17) and it has really impaired her ability to exercise. She has been doing home yoga and stretching. Blood sugars running 100-185.   She was supposed to start new cholesterol medication. She is intolerant to statins and Dr. Dwyane Dee to manage PSK9 treatment. She has had side effects with multiple statins and is hesitant to try something else for her hyperlipidemia.    Past Medical History:  Diagnosis Date  . Adenomatous colon polyp   . Diabetes mellitus    type II--ORAL MEDICATION - NO INSULIN  . Diverticulitis of colon   . Foot injury 08   Left foot/leg with ?? torn muscle -PROBLEM HAS RESOLVED  . Hand pain 1/09   steroid injections--RESOLVED  . Hx of renal calculi    LEFT  . Hyperlipidemia   . Hypertension   . Hypothyroidism    pt states hx of thyroid nodules  . IBS (irritable bowel syndrome)   . Internal hemorrhoids   . Sleep apnea    USES CPAP - SETTING IS 14   Past Surgical History:  Procedure Laterality Date  . COLONOSCOPY W/ BIOPSIES AND POLYPECTOMY  05/19/2005   adenomatous polyps  . NEPHROLITHOTOMY Left 01/27/2013   Procedure: NEPHROLITHOTOMY PERCUTANEOUS;  Surgeon: Dutch Gray, MD;  Location: [facility];  Service: Urology;  Laterality: Left;  . TUBAL LIGATION    . WISDOM TEETH EXTRACTED     Family History  Problem Relation Age of Onset  . Hyperlipidemia Mother   . Thyroid disease  Daughter   . Diabetes Neg Hx   . Stroke Neg Hx    Social History  Substance Use Topics  . Smoking status: Never Smoker  . Smokeless tobacco: Never Used  . Alcohol use No      Review of Systems Per HPI    Objective:   Physical Exam  Constitutional: She is oriented to person, place, and time. She appears well-developed and well-nourished. No distress.  HENT:  Head: Normocephalic and atraumatic.  Eyes: Conjunctivae are normal. Pupils are equal, round, and reactive to light.  Cardiovascular: Normal rate, regular rhythm and normal heart sounds.   Pulmonary/Chest: Effort normal and breath sounds normal.  Musculoskeletal: Normal range of motion. She exhibits no edema.  Neurological: She is alert and oriented to person, place, and time.  Sensation intact left hand, strength 5/5  Skin: Skin is warm and dry. She is not diaphoretic.  Psychiatric: She has a normal mood and affect. Her behavior is normal. Judgment and thought content normal.  Vitals reviewed.        BP 122/72 (BP Location: Left Arm, Patient Position: Sitting, Cuff Size: Normal)   Pulse 77   Temp 98 F (36.7 C) (Oral)   Wt 172 lb 4 oz (78.1 kg)   SpO2 96%   BMI 29.57 kg/m  Wt Readings from Last 3 Encounters:  05/25/16 172 lb 4 oz (78.1 kg)  05/10/16 172 lb 2.9 oz (78.1 kg)  04/25/16 174 lb (78.9 kg)   BP Readings from Last 3 Encounters:  05/25/16 122/72  05/11/16 128/83  04/25/16 120/70     Assessment & Plan:  1. Cerebrovascular accident (CVA) due to thrombosis of right posterior cerebral artery ([place]) - follow up with neurology as scheduled 3/17 - RTC/ER precautions reviewed  2. Uncontrolled type 2 diabetes mellitus with hyperglycemia, without long-term current use of insulin (HCC) - discussed barrier to healthy lifestyle and ways to improve dietary compliance and increase physical activity while foot healing - follow up with Dr. Dwyane Dee as scheduled  3. Familial hypercholesterolemia - contacted Dr.  Ronnie Derby office to help with Repatha prior authorization - patient has been reluctant to take in past, but is willing to try now   Clarene Reamer, FNP-BC  [facility], [place]  05/27/2016 9:14 AM

## 2016-05-25 NOTE — Telephone Encounter (Signed)
Pt is asking for the ramipril, levothyroxine, and the test strips to [facility] [place]  Pt is asking for a call back once this is done.

## 2016-05-25 NOTE — Patient Instructions (Signed)
Please schedule your annual physical with Dr. Deborra Medina in 4-6 months.

## 2016-05-25 NOTE — Telephone Encounter (Signed)
Refills submitted and patient notified.  

## 2016-05-25 NOTE — Telephone Encounter (Signed)
We are trying to get this reauthorized from her insurance, previously she had refused to take it

## 2016-05-29 ENCOUNTER — Encounter: Payer: Self-pay | Admitting: Family Medicine

## 2016-05-29 ENCOUNTER — Other Ambulatory Visit: Payer: Self-pay

## 2016-05-30 ENCOUNTER — Other Ambulatory Visit: Payer: Self-pay

## 2016-05-30 MED ORDER — EVOLOCUMAB 140 MG/ML ~~LOC~~ SOAJ: 140.0000 mg | SUBCUTANEOUS | 1 refills | Status: DC

## 2016-05-31 ENCOUNTER — Other Ambulatory Visit: Payer: Self-pay

## 2016-06-02 ENCOUNTER — Other Ambulatory Visit (INDEPENDENT_AMBULATORY_CARE_PROVIDER_SITE_OTHER): Payer: Commercial Managed Care - HMO

## 2016-06-02 ENCOUNTER — Other Ambulatory Visit: Payer: Self-pay | Admitting: Endocrinology

## 2016-06-02 DIAGNOSIS — E1165 Type 2 diabetes mellitus with hyperglycemia: Secondary | ICD-10-CM

## 2016-06-02 DIAGNOSIS — E782 Mixed hyperlipidemia: Secondary | ICD-10-CM | POA: Diagnosis not present

## 2016-06-02 LAB — LDL CHOLESTEROL, DIRECT: Direct LDL: 195 mg/dL

## 2016-06-02 LAB — BASIC METABOLIC PANEL
BUN: 13 mg/dL (ref 6–23)
CALCIUM: 9.9 mg/dL (ref 8.4–10.5)
CO2: 30 meq/L (ref 19–32)
CREATININE: 0.6 mg/dL (ref 0.40–1.20)
Chloride: 101 mEq/L (ref 96–112)
GFR: 107.08 mL/min (ref >60.00)
GLUCOSE: 89 mg/dL (ref 70–99)
Potassium: 4.2 mEq/L (ref 3.5–5.1)
Sodium: 139 mEq/L (ref 135–145)

## 2016-06-03 LAB — FRUCTOSAMINE: Fructosamine: 247 umol/L (ref 0–285)

## 2016-06-05 ENCOUNTER — Encounter: Payer: Self-pay | Admitting: Endocrinology

## 2016-06-05 ENCOUNTER — Ambulatory Visit (INDEPENDENT_AMBULATORY_CARE_PROVIDER_SITE_OTHER): Payer: Commercial Managed Care - HMO | Admitting: Endocrinology

## 2016-06-05 VITALS — BP 140/74 | HR 78 | Ht 64.0 in | Wt 172.0 lb

## 2016-06-05 DIAGNOSIS — E782 Mixed hyperlipidemia: Secondary | ICD-10-CM

## 2016-06-05 DIAGNOSIS — E063 Autoimmune thyroiditis: Secondary | ICD-10-CM

## 2016-06-05 DIAGNOSIS — E038 Other specified hypothyroidism: Secondary | ICD-10-CM | POA: Diagnosis not present

## 2016-06-05 DIAGNOSIS — E1165 Type 2 diabetes mellitus with hyperglycemia: Secondary | ICD-10-CM | POA: Diagnosis not present

## 2016-06-05 NOTE — Progress Notes (Addendum)
Patient ID: Joanna [name], female   DOB: January 13, 1951, 66 y.o.   MRN: [id number]           Reason for Appointment: Follow-up for Type 2 Diabetes  Referring physician: Deborra Medina  History of Present Illness:          Diagnosis: Type 2 diabetes mellitus, date of diagnosis: 2010?       Recent history:  On her initial consultation she was advised to start Tanzeum for multiple benefits including long-term control; was started in 12/15.   She is also on metformin  A1c in December was 7.3 Fructosamine is now improved at 247        Non-insulin hypoglycemic drugs the patient is taking are: Metformin 1 g once a day, Trulicity A999333 mg weekly .     Glucose patterns and problems identified:  She ran out of Tanzeum prior to her visit in December and her blood sugars were averaging over 200  She was switched from 10 PM to Trulicity A999333 mg weekly and she has no nausea with this  Her blood sugars are still relatively high with periodic readings over 200 in December but they are improving now  However her fasting readings are still running at least 150 and mostly around 180  Has only a few readings later in the day which are relatively better and only occasionally has a high reading of 180 after supper  Lab glucose was 89 late morning  She has not done much exercise but is starting some form of yoga which she thinks is giving her some aerobic activity  She is also trying to eat low fat meals and more salads and vegetables lately; however her weight is about the same    Side effects from medications have been: none Compliance with the medical regimen: fair    Glucose monitoring:  done once or twice a day         Glucometer: One Touch ultra mini,    Blood Glucose readings  from download as above  MEDIAN glucose in the last 4 weeks is 147 Fasting Median 152  Self-care: The diet that the patient has been following is: tries to limit fat intake.     Meals: 3 meals per day. Breakfast is egg, toast,  sometimes cereal /oatmeal.  Occasionally will have fried food  Lunch is a sandwich or soup.  Snacks: Cottage cheese and fruit, no sweet drinks juices          Exercise: Yoga exercise recently, planning to start treadmill.      Dietician visit, most recent: never.     CDE visit: 1/16            Weight history:    Wt Readings from Last 3 Encounters:  06/05/16 172 lb (78 kg)  05/25/16 172 lb 4 oz (78.1 kg)  05/10/16 172 lb 2.9 oz (78.1 kg)    Glycemic control:   Lab Results  Component Value Date   HGBA1C 7.3 (H) 05/11/2016   HGBA1C 7.5 (H) 04/20/2016   HGBA1C 7.1 (H) 10/26/2015   Lab Results  Component Value Date   MICROALBUR 3.1 (H) 04/20/2016   LDLCALC 180 (H) 05/11/2016   CREATININE 0.60 06/02/2016   Lab Results  Component Value Date   FRUCTOSAMINE 247 06/02/2016   FRUCTOSAMINE 274 (H) 07/16/2014    Past diabetes history:  For several years prior to her diagnosis she had been complaining of her feet burning Her blood sugars were mildly increased initially at  diagnosis and she did try to control it with diet alone and exercise without getting them back to normal.  Her A1c had continue to be around 7% with the lowest one 6.7  About 2012 she was started on metformin probably when her A1c was 7.3.   In 2015 her blood sugars had been higher with A1c 8.1%  HYPERLIPIDEMIA: Discussed in review of systems  Allergies as of 06/05/2016      Reactions   Crestor [rosuvastatin]    REACTION: N/V and heartburn   Lipitor [atorvastatin]    REACTION: Hot flashes and flu-like symptoms   Niacin    REACTION: n/v   Pravastatin Sodium    REACTION: Neck swelling and pain in her shoulders and arms.   Sulfonamide Derivatives    Rxn Unknown   Zocor [simvastatin]    REACTION: GI      Medication List       Accurate as of 06/05/16  3:36 PM. Always use your most recent med list.          albuterol 108 (90 Base) MCG/ACT inhaler Commonly known as:  PROVENTIL HFA;VENTOLIN HFA Inhale  2 puffs into the lungs every 6 (six) hours as needed.   aspirin EC 81 MG tablet Take 1 tablet (81 mg total) by mouth daily.   Dulaglutide 0.75 MG/0.5ML Sopn Commonly known as:  TRULICITY Inject A999333 weekly   Evolocumab 140 MG/ML Soaj Commonly known as:  REPATHA SURECLICK Inject [id number] mg into the skin every 14 (fourteen) days.   glucose blood test strip Commonly known as:  ONE TOUCH ULTRA TEST USE TO CHECK BLOOD SUGAR TWICE DAILY   levothyroxine 112 MCG tablet Commonly known as:  SYNTHROID, LEVOTHROID TAKE 1 TABLET BY MOUTH ONCE A DAY   metFORMIN 1000 MG tablet Commonly known as:  GLUCOPHAGE TAKE 1 TABLET (1,000 MG TOTAL) BY MOUTH 2 (TWO) TIMES DAILY.   ramipril 2.5 MG capsule Commonly known as:  ALTACE TAKE 1 CAPSULE (2.5 MG TOTAL) BY MOUTH DAILY.       Allergies:  Allergies  Allergen Reactions  . Crestor [Rosuvastatin]     REACTION: N/V and heartburn  . Lipitor [Atorvastatin]     REACTION: Hot flashes and flu-like symptoms  . Niacin     REACTION: n/v  . Pravastatin Sodium     REACTION: Neck swelling and pain in her shoulders and arms.  . Sulfonamide Derivatives     Rxn Unknown  . Zocor [Simvastatin]     REACTION: GI    Past Medical History:  Diagnosis Date  . Adenomatous colon polyp   . Diabetes mellitus    type II--ORAL MEDICATION - NO INSULIN  . Diverticulitis of colon   . Foot injury 08   Left foot/leg with ?? torn muscle -PROBLEM HAS RESOLVED  . Hand pain 1/09   steroid injections--RESOLVED  . Hx of renal calculi    LEFT  . Hyperlipidemia   . Hypertension   . Hypothyroidism    pt states hx of thyroid nodules  . IBS (irritable bowel syndrome)   . Internal hemorrhoids   . Sleep apnea    USES CPAP - SETTING IS 14    Past Surgical History:  Procedure Laterality Date  . COLONOSCOPY W/ BIOPSIES AND POLYPECTOMY  05/19/2005   adenomatous polyps  . NEPHROLITHOTOMY Left 01/27/2013   Procedure: NEPHROLITHOTOMY PERCUTANEOUS;  Surgeon: Dutch Gray, MD;   Location: [facility];  Service: Urology;  Laterality: Left;  . TUBAL LIGATION    . WISDOM  TEETH EXTRACTED      Family History  Problem Relation Age of Onset  . Hyperlipidemia Mother   . Thyroid disease Daughter   . Diabetes Neg Hx   . Stroke Neg Hx     Social History:  reports that she has never smoked. She has never used smokeless tobacco. She reports that she does not drink alcohol or use drugs.     Review of Systems        LIPIDS: she has long-standing severe hypercholesterolemia and has been intolerant to all statin drugs with various reactions Crestor caused vomiting.  Lipitor caused flulike symptoms, abdominal bloating and hot flashes, pravastatin caused neck swelling and upper body pain, Zocor caused GI upset.  She is not able to tolerate Zetia as she thinks it upset his stomach   She had been explained the need for management of her lipids with medications as she has familial hypercholesterolemia  Repatha was approved through her insurance Last year but this apparently expired and she cannot get it now  She is still reluctant to take it because of perceived side effects seen on TV and potential cost  Again discussed benefits of lipid lowering initially with her risk factors including recent thalamic infarct Discussed that the medication has much higher benefit than risk and side effects are minimal Also explained to her that the medication is indicated in reducing cardiac events  Prescription has been sent a couple of times but she has not been interested in trying this and once the drugstore was not able to get the prescription.  She is also concerned about possible side effects even though safety was discussed in detail She thinks that even though her Hyperlipidemia's familial that her mother has not had any heart problems  LDL particle number previously over 2500 with ideal levels of below 1000 Again LDL is around 200  She finally agrees to try the medication and will get  the prescription when approved      Lab Results  Component Value Date   CHOL 255 (H) 05/11/2016   HDL 33 (L) 05/11/2016   LDLCALC 180 (H) 05/11/2016   LDLDIRECT 195.0 06/02/2016   TRIG 210 (H) 05/11/2016   CHOLHDL 7.7 05/11/2016               Thyroid:    She has had hypothyroidism for about 10 years and initially had a goiter also; highest TSH probably about 9.2 Her last thyroid ultrasound showed only a 6 mm nodule which was stable Thyroid levels have been fairly normal and consistent  Last TSH normal in 11/17  Lab Results  Component Value Date   TSH 2.59 04/20/2016   TSH 2.75 10/26/2015   TSH 0.77 12/24/2014   FREET4 1.03 02/12/2014   FREET4 1.04 12/09/2013   FREET4 0.90 09/26/2012        The blood pressure Is well-controlled with  2.5 mg ramipril  BP Readings from Last 3 Encounters:  06/05/16 140/74  05/25/16 122/72  05/11/16 128/83            LABS:  Lab on 06/02/2016  Component Date Value Ref Range Status  . Sodium 06/02/2016 139  135 - 145 mEq/L Final  . Potassium 06/02/2016 4.2  3.5 - 5.1 mEq/L Final  . Chloride 06/02/2016 101  96 - 112 mEq/L Final  . CO2 06/02/2016 30  19 - 32 mEq/L Final  . Glucose, Bld 06/02/2016 89  70 - 99 mg/dL Final  . BUN 06/02/2016 13  6 - 23 mg/dL Final  . Creatinine, Ser 06/02/2016 0.60  0.40 - 1.20 mg/dL Final  . Calcium 06/02/2016 9.9  8.4 - 10.5 mg/dL Final  . GFR 06/02/2016 107.08  >60.00 mL/min Final  . Fructosamine 06/03/2016 247  0 - 285 umol/L Final   Comment: Published reference interval for apparently healthy subjects between age 49 and 27 is 31 - 285 umol/L and in a poorly controlled diabetic population is 228 - 563 umol/L with a mean of 396 umol/L.   Marland Kitchen Direct LDL 06/02/2016 195.0  mg/dL Final    Physical Examination:  BP 140/74   Pulse 78   Ht 5\' 4"  (1.626 m)   Wt 172 lb (78 kg)   SpO2 96%   BMI 29.52 kg/m          ASSESSMENT/PLAN:   Diabetes type 2, with BMI 31  See history of present illness  for details of her current blood sugar patterns and management  Her blood sugars are recently improving with starting Trulicity Fructosamine is 247 Previously had gone off Tanzeum She tends to have high fasting readings but usually blood sugars are down later in the morning or by lunchtime Discussed blood sugar targets at various times and need to check more readings after meals  She is now more motivated to take care of her diabetes and is regular with her follow-up Although she thinks she can do better with diet and exercise may consider increasing her Trulicity of [id number] is not below 7 on the next visit   Familial hypercholesterolemia with LDL over 200  Patient reports intolerance to various drugs including Zetia  She is going to start Repatha 140 mg every 2 weeks as discussed above after getting prior authorization As above have assured her on its safety and the necessity of treating her significant Hyperlipidemia especially with her recent CVA Patient education brochure given  Recommended follow-up in  3 months, may need to see her sooner if needed  History of hypothyroidism, 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 Any transcriptional errors that result from this process are unintentional.

## 2016-06-05 NOTE — Telephone Encounter (Signed)
Pre authorization submitted to the patient's insurance on 06/05/2016. Waiting on response.

## 2016-06-06 ENCOUNTER — Encounter: Payer: Self-pay | Admitting: Family Medicine

## 2016-06-06 LAB — HM DIABETES EYE EXAM

## 2016-06-29 ENCOUNTER — Other Ambulatory Visit: Payer: Self-pay | Admitting: Endocrinology

## 2016-07-21 ENCOUNTER — Ambulatory Visit (INDEPENDENT_AMBULATORY_CARE_PROVIDER_SITE_OTHER): Payer: Commercial Managed Care - HMO | Admitting: Nurse Practitioner

## 2016-07-21 ENCOUNTER — Encounter: Payer: Self-pay | Admitting: Nurse Practitioner

## 2016-07-21 VITALS — BP 132/88 | HR 70 | Ht 64.0 in | Wt 167.4 lb

## 2016-07-21 DIAGNOSIS — G4733 Obstructive sleep apnea (adult) (pediatric): Secondary | ICD-10-CM

## 2016-07-21 DIAGNOSIS — E785 Hyperlipidemia, unspecified: Secondary | ICD-10-CM | POA: Diagnosis not present

## 2016-07-21 DIAGNOSIS — I639 Cerebral infarction, unspecified: Secondary | ICD-10-CM | POA: Diagnosis not present

## 2016-07-21 DIAGNOSIS — I10 Essential (primary) hypertension: Secondary | ICD-10-CM | POA: Diagnosis not present

## 2016-07-21 DIAGNOSIS — I6381 Other cerebral infarction due to occlusion or stenosis of small artery: Secondary | ICD-10-CM

## 2016-07-21 DIAGNOSIS — E1165 Type 2 diabetes mellitus with hyperglycemia: Secondary | ICD-10-CM | POA: Diagnosis not present

## 2016-07-21 NOTE — Progress Notes (Signed)
[facility]  PATIENT: Joanna [name] DOB: 1950/06/12   REASON FOR VISIT: Hospital follow-up for stroke HISTORY FROM: patient     HISTORY OF PRESENT ILLNESS: Joanna [name], 66 year old female returns to the clinic for hospital follow-up after hospital admission 05/09/2016 for left facial numbness. She says that her left arm and her left leg were numb and it did not improve during the day since she decided to be evaluated she felt some weakness in the left hand. She has a significant history of hypertension hyperlipidemia and obstructive sleep apnea and diabetes which had not been in good control. MRI of the brain showed a right thalmic stroke CT angiogram of the head and neck were unremarkable LDL was 180 hemoglobin A1c 7.5. 2-D echo unremarkable She was not on antiplatelet antithrombotic medications prior to admission. She is now on aspirin 0.81 daily without bruising or bleeding without further stroke or TIA symptoms. She claims her diabetes is under better control with Dulaglutide. She has not started the Repatha for elevated lipids. She has failed statins  in the past . She has obstructive sleep apnea and claims she is compliant with her CPAP. She is also started a Mediterranean diet. Blood pressure in the office today 132/88 . She claims she is exercising by walking and doing yoga. She returns for reevaluation    REVIEW OF SYSTEMS: Full 14 system review of systems performed and notable only for those listed, all others are neg:  Constitutional: neg  Cardiovascular: neg Ear/Nose/Throat: neg  Skin: neg Eyes: neg Respiratory: neg Gastroitestinal: neg  Hematology/Lymphatic: neg  Endocrine: neg Musculoskeletal:neg Allergy/Immunology: neg NeurologyContinued numbness in the left hand Psychiatric: neg Sleep :Obstructive sleep apnea with CPAP   ALLERGIES: Allergies  Allergen Reactions  . Crestor [Rosuvastatin]     REACTION: N/V and heartburn  . Lipitor [Atorvastatin]    REACTION: Hot flashes and flu-like symptoms  . Niacin     REACTION: n/v  . Pravastatin Sodium     REACTION: Neck swelling and pain in her shoulders and arms.  . Sulfonamide Derivatives     Rxn Unknown  . Zocor [Simvastatin]     REACTION: GI    HOME MEDICATIONS: Outpatient Medications Prior to Visit  Medication Sig Dispense Refill  . albuterol (PROVENTIL HFA;VENTOLIN HFA) 108 (90 BASE) MCG/ACT inhaler Inhale 2 puffs into the lungs every 6 (six) hours as needed. (Patient taking differently: Inhale 2 puffs into the lungs every 6 (six) hours as needed for wheezing or shortness of breath. ) 1 Inhaler 0  . aspirin EC 81 MG tablet Take 1 tablet (81 mg total) by mouth daily. 30 tablet 0  . Dulaglutide (TRULICITY) [id number] 0000000 SOPN Inject 0.75 weekly 12 pen 2  . glucose blood (ONE TOUCH ULTRA TEST) test strip USE TO CHECK BLOOD SUGAR TWICE DAILY 100 each 3  . levothyroxine (SYNTHROID, LEVOTHROID) 112 MCG tablet TAKE 1 TABLET BY MOUTH ONCE A DAY 90 tablet 2  . metFORMIN (GLUCOPHAGE) 1000 MG tablet TAKE 1 TABLET (1,000 MG TOTAL) BY MOUTH 2 (TWO) TIMES DAILY. 180 tablet 1  . ramipril (ALTACE) 2.5 MG capsule TAKE 1 CAPSULE (2.5 MG TOTAL) BY MOUTH DAILY. 90 capsule 1  . Evolocumab (REPATHA SURECLICK) [id number] MG/ML SOAJ Inject 140 mg into the skin every 14 (fourteen) days. (Patient not taking: Reported on 07/21/2016) 6 pen 1  . metFORMIN (GLUCOPHAGE) 1000 MG tablet TAKE 1 TABLET (1,000 MG TOTAL) BY MOUTH 2 (TWO) TIMES DAILY. (Patient not taking: Reported on 07/21/2016) 60 tablet  0   No facility-administered medications prior to visit.     PAST MEDICAL HISTORY: Past Medical History:  Diagnosis Date  . Adenomatous colon polyp   . Diabetes mellitus    type II--ORAL MEDICATION - NO INSULIN  . Diverticulitis of colon   . Foot injury 08   Left foot/leg with ?? torn muscle -PROBLEM HAS RESOLVED  . Hand pain 1/09   steroid injections--RESOLVED  . Hx of renal calculi    LEFT  . Hyperlipidemia   .  Hypertension   . Hypothyroidism    pt states hx of thyroid nodules  . IBS (irritable bowel syndrome)   . Internal hemorrhoids   . Sleep apnea    USES CPAP - SETTING IS 14  . Stroke [facility])     PAST SURGICAL HISTORY: Past Surgical History:  Procedure Laterality Date  . COLONOSCOPY W/ BIOPSIES AND POLYPECTOMY  05/19/2005   adenomatous polyps  . NEPHROLITHOTOMY Left 01/27/2013   Procedure: NEPHROLITHOTOMY PERCUTANEOUS;  Surgeon: Dutch Gray, MD;  Location: [facility];  Service: Urology;  Laterality: Left;  . TUBAL LIGATION    . WISDOM TEETH EXTRACTED      FAMILY HISTORY: Family History  Problem Relation Age of Onset  . Hyperlipidemia Mother   . Neurofibromatosis Father   . Thyroid disease Daughter   . Diabetes Neg Hx   . Stroke Neg Hx     SOCIAL HISTORY: Social History   Social History  . Marital status: Married    Spouse name: N/A  . Number of children: 3  . Years of education: N/A   Occupational History  . 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 Smoking status: Never Smoker  . Smokeless tobacco: Never Used  . Alcohol use No  . Drug use: No  . Sexual activity: Not on file   Other Topics Concern  . Not on file   Social History Narrative   Married, one son to daughters. She does office work. One caffeinated drink daily.   Updated as of 04/30/2013     PHYSICAL EXAM  Vitals:   07/21/16 1044  BP: (!) 132/88   Pulse: 70  Weight: 167 lb 6.4 oz (75.9 kg)  Height: 5\' 4"  (1.626 m)   Body mass index is 28.73 kg/m.  Generalized: Well developed, Obese female in no acute distress  Head: normocephalic and atraumatic,. Oropharynx benign  Neck: Supple, no carotid bruits  Cardiac: Regular rate rhythm, no murmur  Musculoskeletal: No deformity   Neurological examination   Mentation: Alert oriented to time, place, history taking. Attention span and concentration appropriate. Recent and remote memory intact.  Follows all commands  speech and language fluent.   Cranial nerve II-XII: Fundoscopic exam reveals sharp disc margins.Pupils were equal round reactive to light extraocular movements were full, visual field were full on confrontational test. Facial sensation and strength were normal. hearing was intact to finger rubbing bilaterally. Uvula tongue midline. head turning and shoulder shrug were normal and symmetric.Tongue protrusion into cheek strength was normal. Motor: normal bulk and tone, full strength in the BUE, BLE, fine finger movements normal, no pronator drift. No focal weakness Sensory: normal and symmetric to light touch, pinprick, and  Vibration,the upper and lower extremities extremities, no evidence of extinction Coordination: finger-nose-finger, heel-to-shin bilaterally, no dysmetria, no tremor  Reflexes:  1+ upper lower and symmetricresponses were flexor bilaterally. Gait and Station: Rising up from seated position without assistance, normal  stance,  moderate stride, good arm swing, smooth turning, able to perform tiptoe, and heel walking without difficulty. Tandem gait is steady  DIAGNOSTIC DATA (LABS, IMAGING, TESTING) - I reviewed patient records, labs, notes, testing and imaging myself where available.  Lab Results  Component Value Date   WBC 6.6 05/10/2016   HGB 14.1 05/10/2016   HCT 41.2 05/10/2016   MCV 86.6 05/10/2016   PLT 258 05/10/2016      Component Value Date/Time   NA 139 06/02/2016 1045   K 4.2 06/02/2016 1045   CL 101 06/02/2016 1045   CO2 30 06/02/2016 1045   GLUCOSE 89 06/02/2016 1045   BUN 13 06/02/2016 1045   CREATININE 0.60 06/02/2016 1045   CALCIUM 9.9 06/02/2016 1045   PROT 7.3 05/10/2016 1650   ALBUMIN 4.5 05/10/2016 1650   AST 43 (H) 05/10/2016 1650   ALT 70 (H) 05/10/2016 1650   ALKPHOS 62 05/10/2016 1650   BILITOT 0.4 05/10/2016 1650   GFRNONAA >60 05/10/2016 1650   GFRAA >60 05/10/2016 1650   Lab Results  Component Value Date   CHOL 255 (H) 05/11/2016   HDL  33 (L) 05/11/2016   LDLCALC 180 (H) 05/11/2016   LDLDIRECT 195.0 06/02/2016   TRIG 210 (H) 05/11/2016   CHOLHDL 7.7 05/11/2016   Lab Results  Component Value Date   HGBA1C 7.3 (H) 05/11/2016    Lab Results  Component Value Date   TSH 2.59 04/20/2016      ASSESSMENT AND PLAN  66 y.o. year old female  has a past medical history of  Diabetes mellitus;  Hyperlipidemia; Hypertension;  Sleep apnea; and Stroke right thalmic stroke   for hospital follow-up for stroke. The patient is a current patient of Dr. Erlinda Hong  who is out of the office today . This note is sent to the work in doctor.      PLAN; Stressed the importance of management of risk factors to prevent further stroke Continue aspirin for secondary stroke prevention Maintain strict control of hypertension with blood pressure goal below 130/90, today's reading 132/88 continue antihypertensive medications Control of diabetes with hemoglobin A1c below 6.5  continue diabetic medications Cholesterol with LDL cholesterol less than 70,  Start Repatha Use CPAP every night for obstructive sleep apnea Exercise by walking, Yoga, etc  eat healthy diet with whole grains,  fresh fruits and vegetables F/U in 3 months Discussed risk for recurrent stroke/ TIA and answered additional questions This was a  visit requiring 25 minutes and medical decision making of high complexity with extensive review of history, hospital chart, counseling and answering questions Dennie Bible, [facility], [facility], APRN  [facility] [address], [place], [place] [address] [phone]

## 2016-07-21 NOTE — Patient Instructions (Signed)
Stressed the importance of management of risk factors to prevent further stroke Continue aspirin for secondary stroke prevention Maintain strict control of hypertension with blood pressure goal below 130/90, today's reading 148/83 continue antihypertensive medications Control of diabetes with hemoglobin A1c below 6.5  continue diabetic medications Cholesterol with LDL cholesterol less than 70, and Start Repatha Use CPAP every night for obstructive sleep apnea Exercise by walking, Yoga, etc  eat healthy diet with whole grains,  fresh fruits and vegetables F/U in 3 months

## 2016-07-24 ENCOUNTER — Other Ambulatory Visit: Payer: Self-pay | Admitting: Endocrinology

## 2016-07-24 NOTE — Progress Notes (Signed)
I have reviewed and agreed above plan. 

## 2016-08-20 HISTORY — PX: CATARACT EXTRACTION W/ INTRAOCULAR LENS  IMPLANT, BILATERAL: SHX1307

## 2016-08-21 ENCOUNTER — Ambulatory Visit: Payer: Self-pay | Admitting: Neurology

## 2016-08-22 ENCOUNTER — Other Ambulatory Visit (INDEPENDENT_AMBULATORY_CARE_PROVIDER_SITE_OTHER): Payer: Commercial Managed Care - HMO

## 2016-08-22 DIAGNOSIS — E038 Other specified hypothyroidism: Secondary | ICD-10-CM | POA: Diagnosis not present

## 2016-08-22 DIAGNOSIS — E1165 Type 2 diabetes mellitus with hyperglycemia: Secondary | ICD-10-CM

## 2016-08-22 DIAGNOSIS — E063 Autoimmune thyroiditis: Secondary | ICD-10-CM

## 2016-08-22 DIAGNOSIS — E782 Mixed hyperlipidemia: Secondary | ICD-10-CM

## 2016-08-22 LAB — HEMOGLOBIN A1C: Hgb A1c MFr Bld: 6.5 % (ref 4.6–6.5)

## 2016-08-22 LAB — COMPREHENSIVE METABOLIC PANEL
ALBUMIN: 4.4 g/dL (ref 3.5–5.2)
ALK PHOS: 68 U/L (ref 39–117)
ALT: 46 U/L — ABNORMAL HIGH (ref 0–35)
AST: 24 U/L (ref 0–37)
BUN: 16 mg/dL (ref 6–23)
CALCIUM: 9.5 mg/dL (ref 8.4–10.5)
CHLORIDE: 100 meq/L (ref 96–112)
CO2: 27 mEq/L (ref 19–32)
CREATININE: 0.7 mg/dL (ref 0.40–1.20)
GFR: 89.56 mL/min (ref >60.00)
Glucose, Bld: 176 mg/dL — ABNORMAL HIGH (ref 70–99)
POTASSIUM: 4.7 meq/L (ref 3.5–5.1)
Sodium: 136 mEq/L (ref 135–145)
TOTAL PROTEIN: 7.1 g/dL (ref 6.0–8.3)
Total Bilirubin: 0.4 mg/dL (ref 0.2–1.2)

## 2016-08-22 LAB — LIPID PANEL
CHOLESTEROL: 245 mg/dL — AB (ref 0–200)
HDL: 38 mg/dL — ABNORMAL LOW (ref >39.00)
NonHDL: 207.49
TRIGLYCERIDES: 232 mg/dL — AB (ref 0.0–149.0)
Total CHOL/HDL Ratio: 6
VLDL: 46.4 mg/dL — ABNORMAL HIGH (ref 0.0–40.0)

## 2016-08-22 LAB — TSH: TSH: 1.38 u[IU]/mL (ref 0.35–4.50)

## 2016-08-22 LAB — LDL CHOLESTEROL, DIRECT: Direct LDL: 178 mg/dL

## 2016-08-23 ENCOUNTER — Other Ambulatory Visit: Payer: Self-pay | Admitting: Endocrinology

## 2016-08-25 ENCOUNTER — Ambulatory Visit (INDEPENDENT_AMBULATORY_CARE_PROVIDER_SITE_OTHER): Payer: Commercial Managed Care - HMO | Admitting: Endocrinology

## 2016-08-25 ENCOUNTER — Encounter: Payer: Self-pay | Admitting: Endocrinology

## 2016-08-25 VITALS — BP 126/76 | HR 70 | Ht 64.0 in | Wt 168.0 lb

## 2016-08-25 DIAGNOSIS — E782 Mixed hyperlipidemia: Secondary | ICD-10-CM

## 2016-08-25 DIAGNOSIS — E1165 Type 2 diabetes mellitus with hyperglycemia: Secondary | ICD-10-CM

## 2016-08-25 NOTE — Progress Notes (Signed)
Patient ID: Joanna [name], female   DOB: [birth year], 66 y.o.   MRN: [id number]           Reason for Appointment: Follow-up   Referring physician: Deborra Medina  History of Present Illness:          Diagnosis: Type 2 diabetes mellitus, date of diagnosis: 2010?       Recent history:  On her initial consultation she was advised to start Tanzeum for multiple benefits including long-term control; was started in 12/15.   She was also on metformin  A1c in December was 7.3 and is now 6.5 which is better than usual        Non-insulin hypoglycemic drugs the patient is taking are: Metformin 1 g once a day, Trulicity 5.36 mg weekly .     Glucose patterns and problems identified:  She continues to do well with Trulicity even though she is only on 0.75 mg  Also she is trying to be more active with walking, previously had limitations because of foot pain.  FASTING blood sugars are overall mildly increased but sometimes as low as 110, overall similar to previous visit  She tends to have relatively better readings in the afternoons and somewhat variable readings and evenings based on her carbohydrate intake, still occasionally may be getting some sweets  Her weight is down 4 pounds  She is also trying to eat low fat meals and more salads and vegetables and she thinks she is following the Mediterranean diet    Side effects from medications have been: none Compliance with the medical regimen: fair    Glucose monitoring:  done once or twice a day         Glucometer: One Touch ultra mini,    Blood Glucose readings  from download   Mean values apply above for all meters except median for One Touch  PRE-MEAL Fasting Lunch Dinner Bedtime Overall  Glucose range: 110-167   10 1-252  126-206    Mean/median: 150  136  156  149  149    Self-care: The diet that the patient has been following is: tries to limit fat intake.     Meals: 3 meals per day. Breakfast is egg, toast, sometimes cereal /oatmeal.   Occasionally will have Sweets  Lunch is a sandwich or soup.  Snacks: Cottage cheese and fruit, no sweet drinks juices          Exercise: walking 15 minutes up to bid.      Dietician visit, most recent: never     CDE visit: 1/16            Weight history:    Wt Readings from Last 3 Encounters:  08/25/16 168 lb (76.2 kg)  07/21/16 167 lb 6.4 oz (75.9 kg)  06/05/16 172 lb (78 kg)    Glycemic control:   Lab Results  Component Value Date   HGBA1C 6.5 08/22/2016   HGBA1C 7.3 (H) 05/11/2016   HGBA1C 7.5 (H) 04/20/2016   Lab Results  Component Value Date   MICROALBUR 3.1 (H) 04/20/2016   LDLCALC 180 (H) 05/11/2016   CREATININE 0.70 08/22/2016   Lab Results  Component Value Date   FRUCTOSAMINE 247 06/02/2016   FRUCTOSAMINE 274 (H) 07/16/2014    Past diabetes history:  For several years prior to her diagnosis she had been complaining of her feet burning Her blood sugars were mildly increased initially at diagnosis and she did try to control it with diet alone and exercise  without getting them back to normal.  Her A1c had continue to be around 7% with the lowest one 6.7  About 2012 she was started on metformin probably when her A1c was 7.3.   In 2015 her blood sugars had been higher with A1c 8.1%    HYPERLIPIDEMIA: Discussed in review of systems    Allergies as of 08/25/2016      Reactions   Crestor [rosuvastatin]    REACTION: N/V and heartburn   Lipitor [atorvastatin]    REACTION: Hot flashes and flu-like symptoms   Niacin    REACTION: n/v   Pravastatin Sodium    REACTION: Neck swelling and pain in her shoulders and arms.   Sulfonamide Derivatives    Rxn Unknown   Zocor [simvastatin]    REACTION: GI      Medication List       Accurate as of 08/25/16 11:59 PM. Always use your most recent med list.          aspirin EC 81 MG tablet Take 1 tablet (81 mg total) by mouth daily.   Dulaglutide 0.75 MG/0.5ML Sopn Commonly known as:  TRULICITY Inject 5.27  weekly   DUREZOL 0.05 % Emul Generic drug:  Difluprednate INSTILL ONE DROP INTO RIGHT EYE 4 TIMES A DAY OR AFTER SURGERY   glucose blood test strip Commonly known as:  ONE TOUCH ULTRA TEST USE TO CHECK BLOOD SUGAR TWICE DAILY   levothyroxine 112 MCG tablet Commonly known as:  SYNTHROID, LEVOTHROID TAKE 1 TABLET BY MOUTH ONCE A DAY   metFORMIN 1000 MG tablet Commonly known as:  GLUCOPHAGE TAKE 1 TABLET (1,000 MG TOTAL) BY MOUTH 2 (TWO) TIMES DAILY.   metFORMIN 1000 MG tablet Commonly known as:  GLUCOPHAGE TAKE 1 TABLET (1,000 MG TOTAL) BY MOUTH 2 (TWO) TIMES DAILY.   moxifloxacin 0.5 % ophthalmic solution Commonly known as:  VIGAMOX INSTILL ONE DROP 4 TIMES A DAILY STARTING 2 DAYS PRIOR TO SURGERY AND 2 DROPS MORNING OF SURGERY   PROLENSA 0.07 % Soln Generic drug:  Bromfenac Sodium PLACE 1 DROP INTO RIGHT EYE ONCE DAILY. BEGIN 2 DAYS BEFORE SURGERY AND CONTINUE AFTER SURGERY   ramipril 2.5 MG capsule Commonly known as:  ALTACE TAKE 1 CAPSULE (2.5 MG TOTAL) BY MOUTH DAILY.       Allergies:  Allergies  Allergen Reactions  . Crestor [Rosuvastatin]     REACTION: N/V and heartburn  . Lipitor [Atorvastatin]     REACTION: Hot flashes and flu-like symptoms  . Niacin     REACTION: n/v  . Pravastatin Sodium     REACTION: Neck swelling and pain in her shoulders and arms.  . Sulfonamide Derivatives     Rxn Unknown  . Zocor [Simvastatin]     REACTION: GI    Past Medical History:  Diagnosis Date  . Adenomatous colon polyp   . Diabetes mellitus    type II--ORAL MEDICATION - NO INSULIN  . Diverticulitis of colon   . Foot injury 08   Left foot/leg with ?? torn muscle -PROBLEM HAS RESOLVED  . Hand pain 1/09   steroid injections--RESOLVED  . Hx of renal calculi    LEFT  . Hyperlipidemia   . Hypertension   . Hypothyroidism    pt states hx of thyroid nodules  . IBS (irritable bowel syndrome)   . Internal hemorrhoids   . Sleep apnea    USES CPAP - SETTING IS 14  .  Stroke [facility])     Past Surgical History:  Procedure Laterality Date  . COLONOSCOPY W/ BIOPSIES AND POLYPECTOMY  05/19/2005   adenomatous polyps  . NEPHROLITHOTOMY Left 01/27/2013   Procedure: NEPHROLITHOTOMY PERCUTANEOUS;  Surgeon: Dutch Gray, MD;  Location: [facility];  Service: Urology;  Laterality: Left;  . TUBAL LIGATION    . WISDOM TEETH EXTRACTED      Family History  Problem Relation Age of Onset  . Hyperlipidemia Mother   . Neurofibromatosis Father   . Thyroid disease Daughter   . Diabetes Neg Hx   . Stroke Neg Hx     Social History:  reports that she has never smoked. She has never used smokeless tobacco. She reports that she does not drink alcohol or use drugs.     Review of Systems        LIPIDS: she has long-standing severe hypercholesterolemia and has been intolerant to all statin drugs with various reactions Crestor caused vomiting.  Lipitor caused flulike symptoms, abdominal bloating and hot flashes, pravastatin caused neck swelling and upper body pain, Zocor caused GI upset.  She Was not able to tolerate Zetia as she thinks it upset his stomach   The patient is wanting to go over the details of all the side effects she had with statin drugs today and how disabled she was with them  She had been explained the need for management of her lipids with medications as she has familial hypercholesterolemia  Repatha was approved through her insuranceBoth last year and this year  She is still reluctant to take it because of fear of side effects  Again discussed benefits of lipid lowering initially with her risk factors including her thalamic infarct Discussed that the medication has much higher benefit than risk and side effects are minimal  Prescription has been sent a couple of times but she at this time is again not been interested in trying this even though she had agreed to do this on the last visit She wants to try Zetia but discussed that this would have only minimal  benefit and is not indicated in monotherapy for event reduction  She again states that even though her Hyperlipidemia is familial that her mother has not had any heart problems  LDL particle number previously over 2500 with ideal levels of below 1000 Again LDL is significantly higher although relatively better at home and 80       Lab Results  Component Value Date   CHOL 245 (H) 08/22/2016   HDL 38.00 (L) 08/22/2016   LDLCALC 180 (H) 05/11/2016   LDLDIRECT 178.0 08/22/2016   TRIG 232.0 (H) 08/22/2016   CHOLHDL 6 08/22/2016               Thyroid:    She has had hypothyroidism for about 10 years and initially had a goiter also; highest TSH probably about 9.2 Currently on 112 g levothyroxine  Her last thyroid ultrasound showed only a 6 mm nodule which was stable Thyroid levels have been fairly normal and consistent Last TSH:    Lab Results  Component Value Date   TSH 1.38 08/22/2016   TSH 2.59 04/20/2016   TSH 2.75 10/26/2015   FREET4 1.03 02/12/2014   FREET4 1.04 12/09/2013   FREET4 0.90 09/26/2012        The blood pressure Is well-controlled with  2.5 mg ramipril  BP Readings from Last 3 Encounters:  08/25/16 126/76  07/21/16 132/88  06/05/16 140/74            LABS:  Lab on  08/22/2016  Component Date Value Ref Range Status  . Hgb A1c MFr Bld 08/22/2016 6.5  4.6 - 6.5 % Final  . Sodium 08/22/2016 136  135 - 145 mEq/L Final  . Potassium 08/22/2016 4.7  3.5 - 5.1 mEq/L Final  . Chloride 08/22/2016 100  96 - 112 mEq/L Final  . CO2 08/22/2016 27  19 - 32 mEq/L Final  . Glucose, Bld 08/22/2016 176* 70 - 99 mg/dL Final  . BUN 08/22/2016 16  6 - 23 mg/dL Final  . Creatinine, Ser 08/22/2016 0.70  0.40 - 1.20 mg/dL Final  . Total Bilirubin 08/22/2016 0.4  0.2 - 1.2 mg/dL Final  . Alkaline Phosphatase 08/22/2016 68  39 - 117 U/L Final  . AST 08/22/2016 24  0 - 37 U/L Final  . ALT 08/22/2016 46* 0 - 35 U/L Final  . Total Protein 08/22/2016 7.1  6.0 - 8.3 g/dL Final   . Albumin 08/22/2016 4.4  3.5 - 5.2 g/dL Final  . Calcium 08/22/2016 9.5  8.4 - 10.5 mg/dL Final  . GFR 08/22/2016 89.56  >60.00 mL/min Final  . Cholesterol 08/22/2016 245* 0 - 200 mg/dL Final  . Triglycerides 08/22/2016 232.0* 0.0 - 149.0 mg/dL Final  . HDL 08/22/2016 38.00* >39.00 mg/dL Final  . VLDL 08/22/2016 46.4* 0.0 - 40.0 mg/dL Final  . Total CHOL/HDL Ratio 08/22/2016 6   Final  . NonHDL 08/22/2016 207.49   Final  . TSH 08/22/2016 1.38  0.35 - 4.50 uIU/mL Final  . Direct LDL 08/22/2016 178.0  mg/dL Final    Physical Examination:  BP 126/76   Pulse 70   Ht 5\' 4"  (1.626 m)   Wt 168 lb (76.2 kg)   BMI 28.84 kg/m          ASSESSMENT/PLAN:   Diabetes type 2, with BMI 31  See history of present illness for details of her current blood sugar patterns and management  Her blood sugars are improving with Trulicity and V2Z is down to 6.5 which is the best in several years She is also losing a little weight with the help of starting to walk and also overall improving diet However she still has mostly high fasting readings and periodically has readings around 190-200 + with going off her diet  Recommendation: She will increase her Trulicity to 1.5 mg   Familial hypercholesterolemia with LDL usually over 200  Details of her discussion today and prior history are as above  She is now refusing to start Repatha even though she had agreed on the last visit and has had prior authorization done twice for her She is afraid of side effects and she is not convinced that she needs treatment for her hyperlipidemia despite very high LDL and LDL particle numbers; explained to her that even though she has had family members with hypercholesterolemia she has diabetic dyslipidemia which is different and puts her at high risk  We will now refer her to the lipid clinic for further management  History of hypothyroidism, 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   Counseling time on subjects discussed above is over 50% of today's 25 minute visit  Cayman Brogden 08/26/2016, 9:17 PM   Note: This office note was prepared with Estate agent. Any transcriptional errors that result from this process are unintentional.

## 2016-08-26 MED ORDER — DULAGLUTIDE 1.5 MG/0.5ML ~~LOC~~ SOAJ: SUBCUTANEOUS | 2 refills | Status: DC

## 2016-08-31 ENCOUNTER — Other Ambulatory Visit: Payer: Commercial Managed Care - HMO

## 2016-09-04 ENCOUNTER — Ambulatory Visit: Payer: Commercial Managed Care - HMO | Admitting: Endocrinology

## 2016-09-08 ENCOUNTER — Ambulatory Visit: Payer: Commercial Managed Care - HMO | Admitting: Endocrinology

## 2016-09-21 ENCOUNTER — Other Ambulatory Visit: Payer: Self-pay | Admitting: Endocrinology

## 2016-09-21 ENCOUNTER — Other Ambulatory Visit: Payer: Self-pay | Admitting: Family Medicine

## 2016-10-24 ENCOUNTER — Ambulatory Visit: Payer: Commercial Managed Care - HMO | Admitting: Nurse Practitioner

## 2016-10-25 ENCOUNTER — Encounter: Payer: Self-pay | Admitting: Nurse Practitioner

## 2016-10-31 ENCOUNTER — Other Ambulatory Visit: Payer: Self-pay | Admitting: Endocrinology

## 2016-11-20 ENCOUNTER — Other Ambulatory Visit: Payer: Self-pay | Admitting: Endocrinology

## 2016-11-25 ENCOUNTER — Other Ambulatory Visit: Payer: Self-pay | Admitting: Endocrinology

## 2016-11-28 ENCOUNTER — Other Ambulatory Visit: Payer: Self-pay | Admitting: Endocrinology

## 2016-12-11 ENCOUNTER — Other Ambulatory Visit: Payer: Self-pay | Admitting: Endocrinology

## 2016-12-11 ENCOUNTER — Other Ambulatory Visit (INDEPENDENT_AMBULATORY_CARE_PROVIDER_SITE_OTHER): Payer: 59

## 2016-12-11 DIAGNOSIS — E782 Mixed hyperlipidemia: Secondary | ICD-10-CM

## 2016-12-11 DIAGNOSIS — E1165 Type 2 diabetes mellitus with hyperglycemia: Secondary | ICD-10-CM

## 2016-12-11 LAB — LDL CHOLESTEROL, DIRECT: LDL DIRECT: 170 mg/dL

## 2016-12-11 LAB — LIPID PANEL
CHOL/HDL RATIO: 6
Cholesterol: 231 mg/dL — ABNORMAL HIGH (ref 0–200)
HDL: 37.3 mg/dL — AB (ref >39.00)
NONHDL: 193.88
TRIGLYCERIDES: 269 mg/dL — AB (ref 0.0–149.0)
VLDL: 53.8 mg/dL — AB (ref 0.0–40.0)

## 2016-12-11 LAB — GLUCOSE, RANDOM: Glucose, Bld: 137 mg/dL — ABNORMAL HIGH (ref 70–99)

## 2016-12-11 LAB — HEMOGLOBIN A1C: HEMOGLOBIN A1C: 6.3 % (ref 4.6–6.5)

## 2016-12-14 ENCOUNTER — Ambulatory Visit (INDEPENDENT_AMBULATORY_CARE_PROVIDER_SITE_OTHER): Payer: 59 | Admitting: Endocrinology

## 2016-12-14 ENCOUNTER — Encounter: Payer: Self-pay | Admitting: Endocrinology

## 2016-12-14 VITALS — BP 134/78 | HR 68 | Ht 64.0 in | Wt 163.4 lb

## 2016-12-14 DIAGNOSIS — E782 Mixed hyperlipidemia: Secondary | ICD-10-CM

## 2016-12-14 DIAGNOSIS — E1165 Type 2 diabetes mellitus with hyperglycemia: Secondary | ICD-10-CM

## 2016-12-14 DIAGNOSIS — E063 Autoimmune thyroiditis: Secondary | ICD-10-CM

## 2016-12-14 MED ORDER — DULAGLUTIDE 1.5 MG/0.5ML ~~LOC~~ SOAJ: SUBCUTANEOUS | 5 refills | Status: DC

## 2016-12-14 MED ORDER — ICOSAPENT ETHYL 1 G PO CAPS: ORAL_CAPSULE | ORAL | 5 refills | Status: DC

## 2016-12-14 NOTE — Progress Notes (Signed)
Patient ID: Joanna [name], female   DOB: 1950-11-03, 66 y.o.   MRN: [id number]           Reason for Appointment: Follow-up   Referring physician: Deborra Medina  History of Present Illness:          Diagnosis: Type 2 diabetes mellitus, date of diagnosis: 2010?       Recent history:  On her initial consultation she was advised to start Tanzeum for multiple benefits including long-term control; was started in 12/15.   She was also on metformin  A1c in December was 7.3 and is continuing to improve, now 6.3        Non-insulin hypoglycemic drugs the patient is taking are: Metformin 1 g 2x  a day, Trulicity 1.5 mg weekly .     Glucose patterns and problems identified:  She continues to do well and A1c has improved with increasing Trulicity up to 1.5 mg weekly  She is not having any nausea with this  She has lost 5 pounds  She is also trying to be walking regularly as before  She is trying some new diet which has relatively more protein for the last 3 weeks or so, usually cutting back on carbohydrates and sweets  FASTING blood sugars are overall mildly increased and overall about the same as last time  Blood sugars are better around suppertime and mildly increased after evening meal but not consistently    Side effects from medications have been: none Compliance with the medical regimen: fair    Glucose monitoring:  done once or twice a day         Glucometer: One Touch ultra mini,    Blood Glucose readings  from download   Mean values apply above for all meters except median for One Touch  PRE-MEAL Fasting Lunch Dinner Bedtime Overall  Glucose range: 120-178    151-170    Mean/median: 148  138  128  159  150    Self-care: The diet that the patient has been following is: tries to limit fat intake.     Meals: 3 meals per day. Breakfast is egg, toast, sometimes cereal /oatmeal.  Occasionally will have Sweets  Lunch is a sandwich or soup.  Snacks: Cottage cheese and fruit, no sweet  drinks juices          Exercise: walking 15 minutes +      Dietician visit, most recent: never     CDE visit: 1/16            Weight history:    Wt Readings from Last 3 Encounters:  12/14/16 163 lb 6.4 oz (74.1 kg)  08/25/16 168 lb (76.2 kg)  07/21/16 167 lb 6.4 oz (75.9 kg)    Glycemic control:   Lab Results  Component Value Date   HGBA1C 6.3 12/11/2016   HGBA1C 6.5 08/22/2016   HGBA1C 7.3 (H) 05/11/2016   Lab Results  Component Value Date   MICROALBUR 3.1 (H) 04/20/2016   LDLCALC 180 (H) 05/11/2016   CREATININE 0.70 08/22/2016   Lab Results  Component Value Date   FRUCTOSAMINE 247 06/02/2016   FRUCTOSAMINE 274 (H) 07/16/2014    Past diabetes history:  For several years prior to her diagnosis she had been complaining of her feet burning Her blood sugars were mildly increased initially at diagnosis and she did try to control it with diet alone and exercise without getting them back to normal.  Her A1c had continue to be around 7%  with the lowest one 6.7  About 2012 she was started on metformin probably when her A1c was 7.3.   In 2015 her blood sugars had been higher with A1c 8.1%    HYPERLIPIDEMIA: Discussed in review of systems    Allergies as of 12/14/2016      Reactions   Crestor [rosuvastatin]    REACTION: N/V and heartburn   Lipitor [atorvastatin]    REACTION: Hot flashes and flu-like symptoms   Niacin    REACTION: n/v   Pravastatin Sodium    REACTION: Neck swelling and pain in her shoulders and arms.   Sulfonamide Derivatives    Rxn Unknown   Zocor [simvastatin]    REACTION: GI      Medication List       Accurate as of 12/14/16  1:33 PM. Always use your most recent med list.          aspirin EC 81 MG tablet Take 1 tablet (81 mg total) by mouth daily.   cholecalciferol 1000 units tablet Commonly known as:  VITAMIN D Take 2,000 Units by mouth daily.   glucose blood test strip Commonly known as:  ONE TOUCH ULTRA TEST USE TO CHECK BLOOD  SUGAR TWICE DAILY   levothyroxine 112 MCG tablet Commonly known as:  SYNTHROID, LEVOTHROID TAKE 1 TABLET BY MOUTH ONCE A DAY   Magnesium 250 MG Tabs Take 1 tablet by mouth 2 (two) times daily.   metFORMIN 1000 MG tablet Commonly known as:  GLUCOPHAGE TAKE 1 TABLET (1,000 MG TOTAL) BY MOUTH 2 (TWO) TIMES DAILY.   ramipril 2.5 MG capsule Commonly known as:  ALTACE TAKE 1 CAPSULE (2.5 MG TOTAL) BY MOUTH DAILY.   TRULICITY 1.5 EM/7.5QG Sopn Generic drug:  Dulaglutide INJECT CONTENTS OF PEN WEEKLY   vitamin B-12 100 MCG tablet Commonly known as:  CYANOCOBALAMIN Take 100 mcg by mouth daily.       Allergies:  Allergies  Allergen Reactions  . Crestor [Rosuvastatin]     REACTION: N/V and heartburn  . Lipitor [Atorvastatin]     REACTION: Hot flashes and flu-like symptoms  . Niacin     REACTION: n/v  . Pravastatin Sodium     REACTION: Neck swelling and pain in her shoulders and arms.  . Sulfonamide Derivatives     Rxn Unknown  . Zocor [Simvastatin]     REACTION: GI    Past Medical History:  Diagnosis Date  . Adenomatous colon polyp   . Diabetes mellitus    type II--ORAL MEDICATION - NO INSULIN  . Diverticulitis of colon   . Foot injury 08   Left foot/leg with ?? torn muscle -PROBLEM HAS RESOLVED  . Hand pain 1/09   steroid injections--RESOLVED  . Hx of renal calculi    LEFT  . Hyperlipidemia   . Hypertension   . Hypothyroidism    pt states hx of thyroid nodules  . IBS (irritable bowel syndrome)   . Internal hemorrhoids   . Sleep apnea    USES CPAP - SETTING IS 14  . Stroke [facility])     Past Surgical History:  Procedure Laterality Date  . COLONOSCOPY W/ BIOPSIES AND POLYPECTOMY  05/19/2005   adenomatous polyps  . NEPHROLITHOTOMY Left 01/27/2013   Procedure: NEPHROLITHOTOMY PERCUTANEOUS;  Surgeon: Dutch Gray, MD;  Location: [facility];  Service: Urology;  Laterality: Left;  . TUBAL LIGATION    . WISDOM TEETH EXTRACTED      Family History  Problem Relation Age  of Onset  .  Hyperlipidemia Mother   . Neurofibromatosis Father   . Thyroid disease Daughter   . Diabetes Neg Hx   . Stroke Neg Hx     Social History:  reports that she has never smoked. She has never used smokeless tobacco. She reports that she does not drink alcohol or use drugs.     Review of Systems        LIPIDS: she has long-standing severe hypercholesterolemia and has been intolerant to all statin drugs with various reactions Crestor caused vomiting.  Lipitor caused flulike symptoms, abdominal bloating and hot flashes, pravastatin caused neck swelling and upper body pain, Zocor caused GI upset.  She Was not able to tolerate Zetia as she thinks it upset his stomach   The patient is wanting to go over the details of all the side effects she had with statin drugs today and how disabled she was with them  She had been explained the need for management of her lipids with medications as she has familial hypercholesterolemia  Repatha was approved through her insurance Both last year and this year  Again discussed benefits of lipid lowering Pharmacologically with her risk factors including her thalamic infarct Discussed that the medication has much higher benefit than risk and side effects are minimal  Prescription has been sent a couple of times but she Has not been interested in trying this even though she had agreed to do this on a previous visit, she is again for afraid of side effects Today discussed with her that [place] and similar drugs have evidence that did reduce cardiovascular events even in addition to statin drugs  She again states that even though her Hyperlipidemia is familial that her mother has not had any heart problems  LDL particle number previously over 2500 with ideal levels of below 1000 Again LDL is significantly high although slightly better  She wants to try a new diet that she is doing Also taking small doses of OTC fish oil      Lab Results  Component  Value Date   CHOL 231 (H) 12/11/2016   HDL 37.30 (L) 12/11/2016   LDLCALC 180 (H) 05/11/2016   LDLDIRECT 170.0 12/11/2016   TRIG 269.0 (H) 12/11/2016   CHOLHDL 6 12/11/2016               Thyroid:    She has had hypothyroidism for about 10 years and initially had a goiter also; highest TSH probably about 9.2 Currently on 112 g levothyroxine  Her last thyroid ultrasound showed only a 6 mm nodule which was stable Thyroid levels have been fairly normal and consistent Last TSH:    Lab Results  Component Value Date   TSH 1.38 08/22/2016   TSH 2.59 04/20/2016   TSH 2.75 10/26/2015   FREET4 1.03 02/12/2014   FREET4 1.04 12/09/2013   FREET4 0.90 09/26/2012        The blood pressure Is well-controlled with  2.5 mg ramipril  BP Readings from Last 3 Encounters:  12/14/16 134/78  08/25/16 126/76  07/21/16 132/88            LABS:  Lab on 12/11/2016  Component Date Value Ref Range Status  . Hgb A1c MFr Bld 12/11/2016 6.3  4.6 - 6.5 % Final   Glycemic Control Guidelines for People with Diabetes:Non Diabetic:  <6%Goal of Therapy: <7%Additional Action Suggested:  >8%   . Glucose, Bld 12/11/2016 137* 70 - 99 mg/dL Final  . Cholesterol 12/11/2016 231* 0 - 200 mg/dL  Final   ATP III Classification       Desirable:  < 200 mg/dL               Borderline High:  200 - 239 mg/dL          High:  > = 240 mg/dL  . Triglycerides 12/11/2016 269.0* 0.0 - 149.0 mg/dL Final   Normal:  <150 mg/dLBorderline High:  150 - 199 mg/dL  . HDL 12/11/2016 37.30* >39.00 mg/dL Final  . VLDL 12/11/2016 53.8* 0.0 - 40.0 mg/dL Final  . Total CHOL/HDL Ratio 12/11/2016 6   Final                  Men          Women1/2 Average Risk     3.4          3.3Average Risk          5.0          4.42X Average Risk          9.6          7.13X Average Risk          15.0          11.0                      . NonHDL 12/11/2016 193.88   Final   NOTE:  Non-HDL goal should be 30 mg/dL higher than patient's LDL goal (i.e. LDL goal  of < 70 mg/dL, would have non-HDL goal of < 100 mg/dL)  . Direct LDL 12/11/2016 170.0  mg/dL Final   Optimal:  <100 mg/dLNear or Above Optimal:  100-129 mg/dLBorderline High:  130-159 mg/dLHigh:  160-189 mg/dLVery High:  >190 mg/dL    Physical Examination:  BP 134/78   Pulse 68   Ht 5\' 4"  (1.626 m)   Wt 163 lb 6.4 oz (74.1 kg)   SpO2 97%   BMI 28.05 kg/m          ASSESSMENT/PLAN:   Diabetes type 2, with BMI 31  See history of present illness for details of her current blood sugar patterns and management  Her blood sugars are improving with Trulicity, Now taking 1.5 mg daily Also compliant with metformin  A1c 6.3 She is also losing a little weight with fairly consistent walking for exercise and cutting back on portions and carbohydrates    Familial hypercholesterolemia with LDL usually over 200  Her LDL is still about 170 and discussed that the should be at least under 100 and preferably under 70 and given her history of the stroke  She again absolutely refuses to consider medications which she has consistently in the past  Since she does have high triglycerides also she is open to the idea of taking Vascepa instead of small doses of OTC fish oil that she is taking Not clear why her referred to the lipid clinic was not processed in April  She will go back to her PCP in a couple of months and have fasting lipids done, she wants to wait until then to get referral to the lipid clinic [place]   History of hypothyroidism, likely autoimmune, to have TSH on next visit  There are no Patient Instructions on file for this visit.    Malaina Mortellaro 12/14/2016, 1:33 PM   Note: This office note was prepared with Dragon voice recognition system technology. Any transcriptional errors that result from this process are unintentional.

## 2016-12-14 NOTE — Patient Instructions (Addendum)
Check blood sugars on waking up    Also check blood sugars about 2 hours after a meal and do this after different meals by rotation  Recommended blood sugar levels on waking up is 90-130 and about 2 hours after meal is 130-160  Please bring your blood sugar monitor to each visit, thank you  

## 2016-12-19 ENCOUNTER — Telehealth: Payer: Self-pay

## 2016-12-19 NOTE — Telephone Encounter (Signed)
Left vm requesting patient call insurance company and find out what the alternative is for [employer]

## 2016-12-20 ENCOUNTER — Other Ambulatory Visit: Payer: Self-pay | Admitting: Endocrinology

## 2016-12-21 ENCOUNTER — Telehealth: Payer: Self-pay | Admitting: Endocrinology

## 2016-12-21 NOTE — Telephone Encounter (Signed)
Icosapent Ethyl (VASCEPA) 1 g CAPS is needing a PA. Call pharmacy at [phone] with any further questions.

## 2016-12-21 NOTE — Telephone Encounter (Signed)
Please advise 

## 2016-12-25 ENCOUNTER — Telehealth: Payer: Self-pay

## 2016-12-25 NOTE — Telephone Encounter (Signed)
I faxed the [employer] Prior Authorization filled out form today (12/25/2016) from Dr. Dwyane Dee.

## 2016-12-26 NOTE — Telephone Encounter (Signed)
Please be on the look out for forms.

## 2016-12-26 NOTE — Telephone Encounter (Signed)
Okay to complete PA request? Thanks!

## 2016-12-26 NOTE — Telephone Encounter (Signed)
I have completed the forms and sent them up front

## 2017-01-11 NOTE — Telephone Encounter (Signed)
Please see note below. 

## 2017-01-11 NOTE — Telephone Encounter (Signed)
Have you seen this PA or form?

## 2017-01-11 NOTE — Telephone Encounter (Signed)
Can you follow up to make sure this has been approved? Thank you!

## 2017-01-11 NOTE — Telephone Encounter (Signed)
I have not- it looks like you faxed this form on 8/6 though

## 2017-01-17 ENCOUNTER — Telehealth: Payer: Self-pay

## 2017-01-17 NOTE — Telephone Encounter (Signed)
Have made patient aware that vascepa is not covered and the PA was not approved

## 2017-01-17 NOTE — Telephone Encounter (Signed)
Left patient a vm letting her know that she was denied for the Vascepa by the insurance company and Dr. Dwyane Dee stated there is no further action that we can take to get it approved- I requested the patient call me back if she has any questions

## 2017-01-18 ENCOUNTER — Other Ambulatory Visit: Payer: Self-pay | Admitting: Endocrinology

## 2017-02-21 ENCOUNTER — Other Ambulatory Visit: Payer: Self-pay | Admitting: Endocrinology

## 2017-03-15 ENCOUNTER — Other Ambulatory Visit: Payer: Self-pay | Admitting: Endocrinology

## 2017-03-15 ENCOUNTER — Other Ambulatory Visit (INDEPENDENT_AMBULATORY_CARE_PROVIDER_SITE_OTHER): Payer: Self-pay

## 2017-03-15 DIAGNOSIS — E063 Autoimmune thyroiditis: Secondary | ICD-10-CM

## 2017-03-15 DIAGNOSIS — E1165 Type 2 diabetes mellitus with hyperglycemia: Secondary | ICD-10-CM

## 2017-03-15 DIAGNOSIS — E782 Mixed hyperlipidemia: Secondary | ICD-10-CM

## 2017-03-15 LAB — COMPREHENSIVE METABOLIC PANEL
ALBUMIN: 4.7 g/dL (ref 3.5–5.2)
ALT: 76 U/L — ABNORMAL HIGH (ref 0–35)
AST: 41 U/L — ABNORMAL HIGH (ref 0–37)
Alkaline Phosphatase: 61 U/L (ref 39–117)
BUN: 16 mg/dL (ref 6–23)
CALCIUM: 10.5 mg/dL (ref 8.4–10.5)
CHLORIDE: 102 meq/L (ref 96–112)
CO2: 33 mEq/L — ABNORMAL HIGH (ref 19–32)
CREATININE: 0.65 mg/dL (ref 0.40–1.20)
GFR: 97.39 mL/min (ref >60.00)
Glucose, Bld: 123 mg/dL — ABNORMAL HIGH (ref 70–99)
Potassium: 5 mEq/L (ref 3.5–5.1)
SODIUM: 144 meq/L (ref 135–145)
Total Bilirubin: 0.5 mg/dL (ref 0.2–1.2)
Total Protein: 7.5 g/dL (ref 6.0–8.3)

## 2017-03-15 LAB — LIPID PANEL
CHOLESTEROL: 269 mg/dL — AB (ref 0–200)
HDL: 42.4 mg/dL (ref >39.00)
LDL CALC: 192 mg/dL — AB (ref 0–99)
NonHDL: 226.66
TRIGLYCERIDES: 174 mg/dL — AB (ref 0.0–149.0)
Total CHOL/HDL Ratio: 6
VLDL: 34.8 mg/dL (ref 0.0–40.0)

## 2017-03-15 LAB — TSH: TSH: 0.17 u[IU]/mL — ABNORMAL LOW (ref 0.35–4.50)

## 2017-03-15 LAB — MICROALBUMIN / CREATININE URINE RATIO
CREATININE, U: 67.9 mg/dL
MICROALB UR: 1.9 mg/dL (ref 0.0–1.9)
Microalb Creat Ratio: 2.8 mg/g (ref 0.0–30.0)

## 2017-03-15 LAB — HEMOGLOBIN A1C: HEMOGLOBIN A1C: 6.6 % — AB (ref 4.6–6.5)

## 2017-03-19 ENCOUNTER — Ambulatory Visit (INDEPENDENT_AMBULATORY_CARE_PROVIDER_SITE_OTHER): Payer: Self-pay | Admitting: Family Medicine

## 2017-03-19 ENCOUNTER — Telehealth: Payer: Self-pay | Admitting: Endocrinology

## 2017-03-19 ENCOUNTER — Encounter: Payer: Self-pay | Admitting: Family Medicine

## 2017-03-19 VITALS — BP 134/96 | HR 69 | Temp 97.6°F | Ht 63.5 in | Wt 167.1 lb

## 2017-03-19 DIAGNOSIS — E1165 Type 2 diabetes mellitus with hyperglycemia: Secondary | ICD-10-CM

## 2017-03-19 DIAGNOSIS — E7801 Familial hypercholesterolemia: Secondary | ICD-10-CM

## 2017-03-19 DIAGNOSIS — I10 Essential (primary) hypertension: Secondary | ICD-10-CM

## 2017-03-19 DIAGNOSIS — E1159 Type 2 diabetes mellitus with other circulatory complications: Secondary | ICD-10-CM

## 2017-03-19 DIAGNOSIS — E78019 Familial hypercholesterolemia, unspecified: Secondary | ICD-10-CM

## 2017-03-19 DIAGNOSIS — Z01419 Encounter for gynecological examination (general) (routine) without abnormal findings: Secondary | ICD-10-CM

## 2017-03-19 DIAGNOSIS — Z23 Encounter for immunization: Secondary | ICD-10-CM

## 2017-03-19 DIAGNOSIS — E063 Autoimmune thyroiditis: Secondary | ICD-10-CM

## 2017-03-19 DIAGNOSIS — Z1211 Encounter for screening for malignant neoplasm of colon: Secondary | ICD-10-CM

## 2017-03-19 MED ORDER — LEVOTHYROXINE SODIUM 112 MCG PO TABS: 112.0000 ug | ORAL_TABLET | Freq: Every day | ORAL | 2 refills | Status: DC

## 2017-03-19 MED ORDER — RAMIPRIL 2.5 MG PO CAPS: ORAL_CAPSULE | ORAL | 2 refills | Status: DC

## 2017-03-19 NOTE — Patient Instructions (Signed)
Great to see you.  I have sent your thyroid labs to Dr. Dwyane Dee.  Either he will contact you with his recommendations or he will contact me and I will let you know.  I have referred you to the lipid clinic and to a gastroenterologist for a colonoscopy.

## 2017-03-19 NOTE — Telephone Encounter (Signed)
TSH done by her PCP is low indicating she needs to reduce her levothyroxine from 112 down to 100 g. However she will need to be seen back here in follow-up in 6 weeks for follow-up thyroid testing and office visit.  Please send prescription for 100 g levothyroxine, 30 tablets with only one refill and make follow-up appointments

## 2017-03-19 NOTE — Assessment & Plan Note (Signed)
TSH very low- she is taking synthroid but this month was given a generic rx. Will refill DAW and send results to Dr. Dwyane Dee.

## 2017-03-19 NOTE — Assessment & Plan Note (Signed)
Refer to lipid clinic 

## 2017-03-19 NOTE — Assessment & Plan Note (Signed)
a1c improved. Followed by endo.

## 2017-03-19 NOTE — Progress Notes (Signed)
Subjective:   Patient ID: Joanna [name], female    DOB: 1950-07-04, 66 y.o.   MRN: [id number]  Joanna [name] is a pleasant 66 y.o. year old female who presents to clinic today with Annual Exam (Patient is here today for a CPE.  She had fasting labs done on 10.25.2018.  She would like to get a Colonoscopy before the years end.) and follow up of chronic medical conditions on 03/19/2017  HPI:  Overdue for colonoscopy, pap smear, mammogram. She is declining all except colonoscopy.  Health Maintenance  Topic Date Due  . Hepatitis C Screening  11/07/1950  . HIV Screening  09/22/1967  . FOOT EXAM  09/22/2015  . INFLUENZA VACCINE  12/20/2016  . MAMMOGRAM  03/19/2018 (Originally 09/30/2009)  . PAP SMEAR  03/19/2018 (Originally 10/21/2002)  . OPHTHALMOLOGY EXAM  06/06/2017  . HEMOGLOBIN A1C  09/13/2017  . COLONOSCOPY  05/05/2018  . TETANUS/TDAP  08/20/2025    DM- much better controlled!! Followed by Dr. Dwyane Dee.  Was last seen on 12/14/16. Note reviewed. She is on ACEI  Lab Results  Component Value Date   HGBA1C 6.6 (H) 03/15/2017   HLD- remains poorly controlled.  She has a h/o CVA and DM- very high risk.  Intolerant to statins and Dr. Dwyane Dee was planning on managing her with PSK9 treatment. She has been refusing these rxs.  And unfortunately Vascepa was denied by her insurance company.  Lab Results  Component Value Date   CHOL 269 (H) 03/15/2017   HDL 42.40 03/15/2017   LDLCALC 192 (H) 03/15/2017   LDLDIRECT 170.0 12/11/2016   TRIG 174.0 (H) 03/15/2017   CHOLHDL 6 03/15/2017   Hypothyroidism- managed by Dr. Dwyane Dee.  Currently taking synthroid 112 mcg daily. Denies symptoms of hypo or hyperthyroidism.  Lab Results  Component Value Date   TSH 0.17 (L) 03/15/2017    Current Outpatient Prescriptions on File Prior to Visit  Medication Sig Dispense Refill  . aspirin EC 81 MG tablet Take 1 tablet (81 mg total) by mouth daily. 30 tablet 0  . cholecalciferol (VITAMIN D) 1000 units tablet  Take 2,000 Units by mouth daily.    . Dulaglutide (TRULICITY) 1.5 XN/2.3FT SOPN INJECT CONTENTS OF PEN WEEKLY 4 pen 5  . glucose blood (ONE TOUCH ULTRA TEST) test strip USE TO CHECK BLOOD SUGAR TWICE DAILY 100 each 3  . levothyroxine (SYNTHROID, LEVOTHROID) 112 MCG tablet TAKE 1 TABLET BY MOUTH ONCE A DAY 90 tablet 2  . Magnesium 250 MG TABS Take 1 tablet by mouth 2 (two) times daily.    . metFORMIN (GLUCOPHAGE) 1000 MG tablet TAKE 1 TABLET (1,000 MG TOTAL) BY MOUTH 2 (TWO) TIMES DAILY. 60 tablet 0  . ramipril (ALTACE) 2.5 MG capsule TAKE 1 CAPSULE (2.5 MG TOTAL) BY MOUTH DAILY. 90 capsule 1  . vitamin B-12 (CYANOCOBALAMIN) 100 MCG tablet Take 100 mcg by mouth daily.     No current facility-administered medications on file prior to visit.     Allergies  Allergen Reactions  . Crestor [Rosuvastatin]     REACTION: N/V and heartburn  . Lipitor [Atorvastatin]     REACTION: Hot flashes and flu-like symptoms  . Niacin     REACTION: n/v  . Pravastatin Sodium     REACTION: Neck swelling and pain in her shoulders and arms.  . Sulfonamide Derivatives     Rxn Unknown  . Zocor [Simvastatin]     REACTION: GI    Past Medical History:  Diagnosis Date  .  Adenomatous colon polyp   . Diabetes mellitus    type II--ORAL MEDICATION - NO INSULIN  . Diverticulitis of colon   . Foot injury 08   Left foot/leg with ?? torn muscle -PROBLEM HAS RESOLVED  . Hand pain 1/09   steroid injections--RESOLVED  . Hx of renal calculi    LEFT  . Hyperlipidemia   . Hypertension   . Hypothyroidism    pt states hx of thyroid nodules  . IBS (irritable bowel syndrome)   . Internal hemorrhoids   . Sleep apnea    USES CPAP - SETTING IS 14  . Stroke [facility])     Past Surgical History:  Procedure Laterality Date  . COLONOSCOPY W/ BIOPSIES AND POLYPECTOMY  05/19/2005   adenomatous polyps  . NEPHROLITHOTOMY Left 01/27/2013   Procedure: NEPHROLITHOTOMY PERCUTANEOUS;  Surgeon: Dutch Gray, MD;  Location: [facility];   Service: Urology;  Laterality: Left;  . TUBAL LIGATION    . WISDOM TEETH EXTRACTED      Family History  Problem Relation Age of Onset  . Hyperlipidemia Mother   . Neurofibromatosis Father   . Thyroid disease Daughter   . Diabetes Neg Hx   . Stroke Neg Hx     Social History   Social History  . Marital status: Married    Spouse name: N/A  . Number of children: 3  . Years of education: N/A   Occupational History  . 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 Smoking status: Never Smoker  . Smokeless tobacco: Never Used  . Alcohol use No  . Drug use: No  . Sexual activity: Not on file   Other Topics Concern  . Not on file   Social History Narrative   Married, one son to daughters. She does office work. One caffeinated drink daily.   Updated as of 04/30/2013   The PMH, PSH, Social History, Family History, Medications, and allergies have been reviewed in [facility], and have been updated if relevant.   Review of Systems  Constitutional: Negative.   HENT: Negative.   Respiratory: Negative.   Cardiovascular: Negative.   Gastrointestinal: Negative.   Endocrine: Negative.   Genitourinary: Negative.   Musculoskeletal: Negative.   Allergic/Immunologic: Negative.   Neurological: Negative.   Hematological: Negative.   Psychiatric/Behavioral: Negative.   All other systems reviewed and are negative.      Objective:    BP (!) 134/96 (BP Location: Right Arm, Patient Position: Sitting, Cuff Size: Normal)   Pulse 69   Temp 97.6 F (36.4 C) (Oral)   Ht 5' 3.5" (1.613 m)   Wt 167 lb 1.9 oz (75.8 kg)   SpO2 95%   BMI 29.14 kg/m    Physical Exam    General:  Well-developed,well-nourished,in no acute distress; alert,appropriate and cooperative throughout examination Head:  normocephalic and atraumatic.   Eyes:  vision grossly intact, PERRL Ears:  R ear normal and L ear normal externally, TMs clear bilaterally Nose:  no external  deformity.   Mouth:  good dentition.   Neck:  No deformities, masses, or tenderness noted. Breasts:  No mass, nodules, thickening, tenderness, bulging, retraction, inflamation, nipple discharge or skin changes noted.   Lungs:  Normal respiratory effort, chest expands symmetrically. Lungs are clear to auscultation, no crackles or wheezes. Heart:  Normal rate and regular rhythm. S1 and S2 normal without gallop, murmur, click, rub or other extra sounds. Abdomen:  Bowel sounds positive,abdomen  soft and non-tender without masses, organomegaly or hernias noted. Msk:  No deformity or scoliosis noted of thoracic or lumbar spine.   Extremities:  No clubbing, cyanosis, edema, or deformity noted with normal full range of motion of all joints.   Neurologic:  alert & oriented X3 and gait normal.   Skin:  Intact without suspicious lesions or rashes Cervical Nodes:  No lymphadenopathy noted Axillary Nodes:  No palpable lymphadenopathy Psych:  Cognition and judgment appear intact. Alert and cooperative with normal attention span and concentration. No apparent delusions, illusions, hallucinations      Assessment & Plan:   Well woman exam  Acquired autoimmune hypothyroidism  Type 2 diabetes mellitus with other circulatory complication, without long-term current use of insulin ([place])  Poorly controlled type 2 diabetes mellitus ([place])  Uncontrolled type 2 diabetes mellitus with hyperglycemia, without long-term current use of insulin ([place])  Familial hypercholesterolemia - Plan: Ambulatory referral to Lipid Clinic  Essential hypertension  Screening for malignant neoplasm of colon - Plan: Ambulatory referral to Gastroenterology  Need for influenza vaccination - Plan: Flu Vaccine QUAD 6+ mos PF IM (Fluarix Quad PF) No Follow-up on file.

## 2017-03-19 NOTE — Assessment & Plan Note (Signed)
Reviewed preventive care protocols, scheduled due services, and updated immunizations Discussed nutrition, exercise, diet, and healthy lifestyle.  Orders Placed This Encounter  Procedures  . Flu Vaccine QUAD 6+ mos PF IM (Fluarix Quad PF)  . Ambulatory referral to Lipid Clinic  . Ambulatory referral to Gastroenterology   Declining pap smear and mammogram.

## 2017-03-19 NOTE — Assessment & Plan Note (Signed)
Well controlled. No changes made to rxs. 

## 2017-03-20 NOTE — Telephone Encounter (Signed)
Noted. Thank you, Dr. Dwyane Dee. Please call pt to let her know Dr. Dwyane Dee feels she should try name brand synthroid 100 mcg rather than 112 mcg and follow up with him in 6 weeks.  If she is okay with this, I will send eRx to her pharmacy.

## 2017-03-20 NOTE — Telephone Encounter (Signed)
Review of her prescription history indicates none of the prescriptions are written as d.a.w. Synthroid.  She has been getting generics  Recently the [employer] Generic has been backordered and she probably got a different manufacturer.  I still think we should try 100 g brand name Synthroid d.a.w. daily.

## 2017-03-20 NOTE — Telephone Encounter (Signed)
Patient was concerned that possibly her TSH was off because she was given a generic synthroid rather than trade name.  I sent in trade name rx of synthroid 112 mcg.  Would it be okay if she tried this for a few weeks and had labs repeated or should she go ahead and decrease her synthroid to 100 mcg?

## 2017-03-20 NOTE — Telephone Encounter (Signed)
LMOVM to RTC to advise if ok with trying DAW thyroid med per TA and Dr. Gweneth Fritter RTC will build in Synthroid 165mcg 1qd DAW and I also said she is to F/U with Dr. Raliegh Ip in 6 weeks per his request after this/thx dmf

## 2017-03-20 NOTE — Telephone Encounter (Signed)
So are you saying that you want me to call the patient to make sure she has not picked up the 112 mcg Levothyroxine and if not send the Synthroid 100 mcg?  Please advise.

## 2017-03-22 ENCOUNTER — Telehealth: Payer: Self-pay | Admitting: Endocrinology

## 2017-03-22 ENCOUNTER — Ambulatory Visit: Payer: Self-pay | Admitting: Nurse Practitioner

## 2017-03-22 ENCOUNTER — Other Ambulatory Visit: Payer: Self-pay | Admitting: Endocrinology

## 2017-03-22 ENCOUNTER — Other Ambulatory Visit: Payer: Self-pay

## 2017-03-22 ENCOUNTER — Ambulatory Visit: Payer: Self-pay

## 2017-03-22 ENCOUNTER — Telehealth: Payer: Self-pay | Admitting: Family Medicine

## 2017-03-22 MED ORDER — LEVOTHYROXINE SODIUM 100 MCG PO TABS: 100.0000 ug | ORAL_TABLET | Freq: Every day | ORAL | 0 refills | Status: DC

## 2017-03-22 MED ORDER — SYNTHROID 100 MCG PO TABS: 100.0000 ug | ORAL_TABLET | Freq: Every day | ORAL | 1 refills | Status: DC

## 2017-03-22 NOTE — Telephone Encounter (Signed)
Copied from [place] #[id number]. Topic: Quick Communication - See Telephone Encounter >> Mar 22, 2017 12:38 PM Vernona Rieger wrote: CRM for notification. See Telephone encounter for:  03/22/17. Pt it wanting to know if she can be bumped up to 1.25 for the generic for LEVOTHYROXINE. She says that the original is to expensive.

## 2017-03-22 NOTE — Telephone Encounter (Signed)
6 weeks appointment with labs is needed with changing her thyroid medication today

## 2017-03-22 NOTE — Telephone Encounter (Signed)
Dr. Dwyane Dee, her endocrinologist, feels she should be taking synthroid 100 mcg daily.

## 2017-03-22 NOTE — Telephone Encounter (Signed)
TA-I called pharmacist and she states that the cost is $30.00 for 30 pills and cannot afford it/she states that the pharmacist said that with her numbers being the way they are it would be safe to try the 135mcg to see if that helps/after conversing with her she is basically not happy with Dr. Dwyane Dee and feels that he never addresses her issue with her thyroid and how she is feeling and all he wants to talk about is the diabetes/plz advise/thx dmf

## 2017-03-22 NOTE — Telephone Encounter (Signed)
TA-Pt called stating that she is not able to afford the branded Synthroid and would like to take Levothyroxine 170mcg/plz advise/thx dmf

## 2017-03-22 NOTE — Telephone Encounter (Signed)
See attached.  Thanks!

## 2017-03-22 NOTE — Telephone Encounter (Signed)
LM for pt to call back to get her appt moved up

## 2017-03-22 NOTE — Telephone Encounter (Signed)
Based on her labs, 125 mcg would be too high of a dose.  I would still recommend 100 mcg, we could try generic synthroid at that dose.

## 2017-03-22 NOTE — Telephone Encounter (Signed)
-----   Message from Elayne Snare, MD sent at 03/16/2017  8:18 AM EDT ----- Regarding: Change appointment She just had her labs and does not have an appointment until January.  Needs to be seen as soon as possible because of abnormal labs

## 2017-03-22 NOTE — Telephone Encounter (Signed)
Called patient and left a voice message to let her know that I am sending the new prescription to her pharmacy and to please call our office back and schedule a lab appointment and a 6 week follow up per Dr. Dwyane Dee.

## 2017-03-22 NOTE — Telephone Encounter (Signed)
Joanna [name]/C'ed Brand and faxed in Levothyroxine 174mcg/pt is not happy but will try the 167mcg/faxed to pharmacy/pt aware/thx dmf

## 2017-03-22 NOTE — Telephone Encounter (Signed)
Please advise. It looks like Arnette Norris sent in the 112 mcg?

## 2017-03-22 NOTE — Telephone Encounter (Signed)
She should be taking 100 g brand name Synthroid d.a.w. daily.  Also needs to be set up for 6 weeks follow-up with labs

## 2017-03-30 NOTE — Telephone Encounter (Signed)
Attempted to call pt to get her appt moved up no answer and no vm pick up will try again shortly

## 2017-04-05 ENCOUNTER — Other Ambulatory Visit: Payer: Self-pay | Admitting: Endocrinology

## 2017-04-05 NOTE — Telephone Encounter (Signed)
LM for pt to call back to set up an earlier appt

## 2017-04-09 ENCOUNTER — Encounter: Payer: Self-pay | Admitting: Physician Assistant

## 2017-04-09 NOTE — Progress Notes (Signed)
Cardiology Office Note    Date:  04/10/2017  ID:  Joanna [name], DOB 1950/07/01, MRN [id number] PCP:  [name], MD  Cardiologist:  New, reviewed with Dr. Marlou Porch   Chief Complaint: high cholesterol  History of Present Illness:  Joanna [name] is a 66 y.o. 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 Deborra Medina.  She has no prior cardiac history but did have acute right thalamic infarct in 04/2016 with CTA head/neck showing small left vertebral originated directly from arch, with high grade ostial stenosis but likely no significance with regard to right thalamic infarct. She was followed by neurology who felt this was due to small vessel disease. They recommended daily aspirin and follow-up MRI in 1 month given somewhat atypical enhancement pattern. She did not receive IV t-PA due to delay in arrival.  2D echo at that time showed mild LVH, grade 1 DD, normal LVEF, trivial tricuspid regurgitation. She has had intolerances to Crestor, Lipitor, Niacin, Zocor, Pravastatin as below. Last labs 02/2017 showed TSH 0.17 (being adjusted/followed by endocrinology), triglycerides 174, HDL 52, LDL 192, total cholesterol 269, K 5.0, BUN 16, Cr 0.65, AST 41, ALT 76 (prior abnormalities noted in 2017 as well), A1c 6.6.   She reports a longstanding history of issues with statin medications. She believes that Lipitor contributed to her development of diabetes which was diagnosed about 15 years ago. She also recalls history of bladder bleeding in the past with this, hot flashes and flu-like symptoms. She had nausea/vomiting with Crestor, nausea and vomiting with niacin, myalgias with pravastatin, and GI upset with Zocor. She also recalls GI upset with Zetia as well. She says her endocrinologist had discussed PCSK9 with her over the summer but it was felt at  that time that her insurance may not cover it. In review of records, it appears this was discussed but she was hesitant to proceed. Vascepa was prescribed but insurance denied this. In general she has felt well. She remains active and walks regularly. She knows she needs to do more of this, but in general has not been functionally limited. She denies any chest pain or dyspnea on exertion. She has residual left arm heaviness/left leg heaviness from her stroke, not particularly worse during physical activity. This seems to be worse with emotional stress per patient. She walked 3.5 miles just a few days ago without any angina. She states her mother had significant hyperlipidemia in her 66s and actually was seen in [place]. Her mother made it to 42 without any CV abnormalities, except did have a nervous breakdown caring for father who had Parkinson's. She is one of 5 siblings, several of whom have hyperlipidemia but no CV disease.  Past Medical History:  Diagnosis Date  . Abnormal liver function tests   . Adenomatous colon polyp   . Diabetes mellitus    type II--ORAL MEDICATION - NO INSULIN  . Diverticulitis of colon   . Fatty liver    a. mild fatty liver infiltration by CT 2014.  Marland Kitchen Foot injury 08   Left foot/leg with ?? torn muscle -PROBLEM HAS RESOLVED  . Hand pain 1/09   steroid injections--RESOLVED  . Hx of renal calculi    LEFT  . Hyperlipidemia   . Hypertension   . Hypothyroidism    pt states hx of thyroid nodules  . IBS (irritable  bowel syndrome)   . Internal hemorrhoids   . Sleep apnea    USES CPAP - SETTING IS 14  . Statin intolerance   . Stroke [facility])    a. right thalamic infarct in 04/2016 felt by neuro to be due to small vessel disease.    Past Surgical History:  Procedure Laterality Date  . COLONOSCOPY W/ BIOPSIES AND POLYPECTOMY  05/19/2005   adenomatous polyps  . NEPHROLITHOTOMY Left 01/27/2013   Procedure: NEPHROLITHOTOMY PERCUTANEOUS;  Surgeon: Dutch Gray, MD;  Location: [facility];  Service: Urology;  Laterality: Left;  . TUBAL LIGATION    . WISDOM TEETH EXTRACTED      Current Medications: Current Meds  Medication Sig  . aspirin EC 81 MG tablet Take 1 tablet (81 mg total) by mouth daily.  . cholecalciferol (VITAMIN D) 1000 units tablet Take 2,000 Units by mouth daily.  . Dulaglutide (TRULICITY) 1.5 OZ/3.6UY SOPN INJECT CONTENTS OF PEN WEEKLY  . glucose blood (ONE TOUCH ULTRA TEST) test strip USE TO CHECK BLOOD SUGAR TWICE DAILY  . levothyroxine (SYNTHROID, LEVOTHROID) 100 MCG tablet Take 1 tablet (100 mcg total) by mouth daily.  . Magnesium 250 MG TABS Take 1 tablet by mouth 2 (two) times daily.  . metFORMIN (GLUCOPHAGE) 1000 MG tablet TAKE 1 TABLET (1,000 MG TOTAL) BY MOUTH 2 (TWO) TIMES DAILY.  . ramipril (ALTACE) 2.5 MG capsule TAKE 1 CAPSULE (2.5 MG TOTAL) BY MOUTH DAILY.  . vitamin B-12 (CYANOCOBALAMIN) 100 MCG tablet Take 100 mcg by mouth daily.     Allergies:   Crestor [rosuvastatin]; Lipitor [atorvastatin]; Niacin; Pravastatin sodium; Sulfonamide derivatives; and Zocor [simvastatin]   Social History   Socioeconomic History  . Marital status: Married    Spouse name: None  . Number of children: 3  . Years of education: None  . Highest education level: None  Social Needs  . Financial resource strain: None  . Food insecurity - worry: None  . Food insecurity - inability: None  . Transportation needs - medical: None  . Transportation needs - non-medical: None  Occupational History  . Occupation: SECRETARY    Employer: [employer]    Comment: sits most of day   Tobacco Use  . Smoking status: Never Smoker  . Smokeless tobacco: Never Used  Substance and Sexual Activity  . Alcohol use: No  . Drug use: No  . Sexual activity: None  Other Topics Concern  . None  Social History Narrative   Married, one son to daughters. She does office work. One caffeinated drink daily.   Updated as of 04/30/2013     Family History:  Family History    Problem Relation Age of Onset  . Hyperlipidemia Mother   . Neurofibromatosis Father   . Thyroid disease Daughter   . Diabetes Neg Hx   . Stroke Neg Hx    ROS:   Please see the history of present illness.  All other systems are reviewed and otherwise negative.    PHYSICAL EXAM:   VS:  BP 136/80   Pulse 82   Ht 5' 3.5" (1.613 m)   Wt 169 lb 12.8 oz (77 kg)   SpO2 98%   BMI 29.61 kg/m   BMI: Body mass index is 29.61 kg/m. GEN: Well nourished, well developed WF in no acute distress  HEENT: normocephalic, atraumatic Neck: no JVD, carotid bruits, or masses Cardiac: RRR; no murmurs, rubs, or gallops, no edema  Respiratory:  clear to auscultation bilaterally, normal work of breathing  GI: soft, nontender, nondistended, + BS MS: no deformity or atrophy  Skin: warm and dry, no rash Neuro:  Alert and Oriented x 3, Strength and sensation are intact, follows commands Psych: euthymic mood, full affect  Wt Readings from Last 3 Encounters:  04/10/17 169 lb 12.8 oz (77 kg)  03/19/17 167 lb 1.9 oz (75.8 kg)  12/14/16 163 lb 6.4 oz (74.1 kg)      Studies/Labs Reviewed:   EKG:  EKG was ordered today and personally reviewed by me and demonstrates NSR 1st degree AVB 88bpm, right atrial enlargement, nonspecific ST-T Changes including TW changes inferiorly somewhat accentuated from 04/2016  Recent Labs: 05/10/2016: Hemoglobin 14.1; Platelets 258 03/15/2017: ALT 76; BUN 16; Creatinine, Ser 0.65; Potassium 5.0; Sodium 144; TSH 0.17   Lipid Panel    Component Value Date/Time   CHOL 269 (H) 03/15/2017 1039   TRIG 174.0 (H) 03/15/2017 1039   HDL 42.40 03/15/2017 1039   CHOLHDL 6 03/15/2017 1039   VLDL 34.8 03/15/2017 1039   LDLCALC 192 (H) 03/15/2017 1039   LDLDIRECT 170.0 12/11/2016 0943    Additional studies/ records that were reviewed today include: Summarized above    ASSESSMENT & PLAN:   This patient's case was discussed in depth with Dr. Marlou Porch. The plan below was  formulated per our discussion.  1. Hyperlipidemia with documented statin intolerance - agree with Dr. Deborra Medina that patient should be evaluated in lipid clinic. Pt believes she is allergic to statins, with significant reactions to all agents above. We are surprised to hear patient states she was previously informed her insurance would not cover PCSK-9 inhibitors. However, it appears from Dr. Ronnie Derby notes in 11/2016 he recommended these and actually sent in a prescription several times but she was not interested due to risk of side effects. He did attempt to prescribe Vascepa but this was denied, so perhaps she is confusing the two. With her known hyperlipidemia, cerebrovascular disease, and history of stroke, PCSK-9 medications would be medically indicated. Will refer to lipid clinic as our pharmDs are well versed in the nuances of coverage. If there is hesitance for insurance coverage at time of eval, could consider formal genetic testing to solidify diagnosis of familial hyperlipidemia, but the diagnosis is at least suspected based on her family history and LDL level. 2. Abnormal LFTs - h/o fatty liver per CT 2014. Further monitoring/eval per primary care. 3. History of stroke - neuro note from 2017 recommended f/u MRI 1 month after discharge. I do not see this was ever performed. I have asked her to touch base with PCP versus neurology for this. 4. Abnormal EKG - reviewed with Dr. Marlou Porch. At this time, she does not have any anginal symptoms. Continue risk factor modification as above. Warning symptoms reviewed with patient and asked [place] to keep [place] informed if she should develop any concerning symptoms going forward. Of note, blood pressure is followed by PCP, would recommend tight control given history of stroke. TSH was recently suppressed and patient was hesitant to lowering dose of levothyroxine to 113mcg per phone notes (had recently received different manufacturer).  5. Very soft systolic murmur - recent  echo within last 12 months showed trivial TR, otherwise no significant valvular disease. Per d/w MD, follow clinically for now.   Disposition: F/u with Dr. Marlou Porch in 1 year.   Medication Adjustments/Labs and Tests Ordered: Current medicines are reviewed at length with the patient today.  Concerns regarding medicines are outlined above. Medication changes, Labs and Tests ordered  today are summarized above and listed in the Patient Instructions accessible in Encounters.   Signed, Charlie Pitter, PA-C  04/10/2017 2:52 PM    [facility] [address], [place], [place]  [address] Phone: [phone]; Fax: [phone]

## 2017-04-10 ENCOUNTER — Encounter: Payer: Self-pay | Admitting: Endocrinology

## 2017-04-10 ENCOUNTER — Encounter: Payer: Self-pay | Admitting: Physician Assistant

## 2017-04-10 ENCOUNTER — Ambulatory Visit: Payer: 59 | Admitting: Physician Assistant

## 2017-04-10 ENCOUNTER — Telehealth: Payer: Self-pay | Admitting: Physician Assistant

## 2017-04-10 VITALS — BP 136/80 | HR 82 | Ht 63.5 in | Wt 169.8 lb

## 2017-04-10 DIAGNOSIS — R011 Cardiac murmur, unspecified: Secondary | ICD-10-CM

## 2017-04-10 DIAGNOSIS — R945 Abnormal results of liver function studies: Secondary | ICD-10-CM

## 2017-04-10 DIAGNOSIS — Z8673 Personal history of transient ischemic attack (TIA), and cerebral infarction without residual deficits: Secondary | ICD-10-CM

## 2017-04-10 DIAGNOSIS — Z136 Encounter for screening for cardiovascular disorders: Secondary | ICD-10-CM

## 2017-04-10 DIAGNOSIS — R7989 Other specified abnormal findings of blood chemistry: Secondary | ICD-10-CM

## 2017-04-10 DIAGNOSIS — Z789 Other specified health status: Secondary | ICD-10-CM

## 2017-04-10 DIAGNOSIS — E782 Mixed hyperlipidemia: Secondary | ICD-10-CM | POA: Diagnosis not present

## 2017-04-10 NOTE — Telephone Encounter (Signed)
Please call patient. Endocrinology records reviewed and it appears it was a medication called Vascepa that was denied, not particularly noted that PCSK9 was not covered so she may have a good chance of success in the lipid clinic.  Also, since BP was mildly elevated in clinic, would recommend she monitor her BP periodically and call her PCP if she tends to notice readings of greater than 130 on the top number or 80 on the bottom number.  Dayna Dunn PA-C

## 2017-04-10 NOTE — Telephone Encounter (Signed)
Mailed letter °

## 2017-04-10 NOTE — Telephone Encounter (Signed)
Tried to reach pt, per Melina Copa, PA-C, left a message for pt to call back.

## 2017-04-10 NOTE — Patient Instructions (Addendum)
.  Medication Instructions:  Your physician recommends that you continue on your current medications as directed. Please refer to the Current Medication list given to you today.   Labwork: None ordered   Testing/Procedures: None ordered  Follow-Up: Your physician recommends that you schedule a follow-up appointment in: [place] PCSK9 INHIBITOR   Your physician wants you to follow-up in: [place] will receive a reminder letter in the mail two months in advance. If you don't receive a letter, please call our office to schedule the follow-up appointment.    Any Other Special Instructions Will Be Listed Below (If Applicable).     If you need a refill on your cardiac medications before your next appointment, please call your pharmacy

## 2017-04-18 NOTE — Telephone Encounter (Signed)
2nd attempt to reach pt, per Melina Copa, PA-C.  Left a message for pt to call back.

## 2017-04-23 NOTE — Telephone Encounter (Signed)
3rd attempt to reach pt re: referral to the Lipid clinic. Left another message. Will close encounter now since 3rd attempt.

## 2017-04-26 ENCOUNTER — Ambulatory Visit: Payer: 59

## 2017-05-10 ENCOUNTER — Other Ambulatory Visit: Payer: Self-pay | Admitting: Endocrinology

## 2017-05-10 NOTE — Progress Notes (Deleted)
Patient ID: Joanna [name]                 DOB: June 03, 1950                    MRN: [id number]     HPI: Joanna [name] is a 66 y.o. female patient referred to lipid clinic by Melina Copa, PA. PMH is significant for HTN, HLD, DM, cerebrovascular disease, right thalamic infarct 04/2016, abnormal LFTs due to fatty liver per CT in 2014, and OSA.  Repatha ordered starting in 2016-2018 but pt never took No doses for statins available repatha FH diagnosis?  fam hx?  She reports a longstanding history of issues with statin medications. She believes that Lipitor contributed to her development of diabetes which was diagnosed about 15 years ago. She also recalls history of bladder bleeding in the past with this, hot flashes and flu-like symptoms. She had nausea/vomiting with Crestor and niacin, myalgias with pravastatin, and GI upset with Zocor. She also recalls GI upset with Zetia as well.   She says her endocrinologist had discussed PCSK9 with her over the summer but it was felt at that time that her insurance may not cover it. In review of records, it appears this was discussed but she was hesitant to proceed. Vascepa was prescribed but insurance denied this.   In general she has felt well. She remains active and walks regularly. She knows she needs to do more of this, but in general has not been functionally limited. She denies any chest pain or dyspnea on exertion. She has residual left arm heaviness/left leg heaviness from her stroke, not particularly worse during physical activity. This seems to be worse with emotional stress per patient. She walked 3.5 miles just a few days ago without any angina. She is one of 5 siblings, several of whom have hyperlipidemia but no CV disease.  Current Medications: none Intolerances: Crestor, Lipitor, Zocor, Pravachol, niacin Risk Factors: stroke, baseline LDL > 190 LDL goal: 70mg /dL  Diet:   Exercise:   Family History: Mother, sister, and brother with  HLD.  Social History: Denies tobacco, alcohol, and illicit drug use  Labs: 02/2017: TC 269, TG 174, HDL 52, LDL 192 (no therapy)  Past Medical History:  Diagnosis Date  . Abnormal liver function tests   . Adenomatous colon polyp   . Cerebrovascular disease    a. CT 04/2016 -calcific plaque at the origin LEFT vertebral contributing to severe stenosis.  . Diabetes mellitus    type II--ORAL MEDICATION - NO INSULIN  . Diverticulitis of colon   . Fatty liver    a. mild fatty liver infiltration by CT 2014.  Marland Kitchen Foot injury 08   Left foot/leg with ?? torn muscle -PROBLEM HAS RESOLVED  . Hand pain 1/09   steroid injections--RESOLVED  . Hx of renal calculi    LEFT  . Hyperlipidemia   . Hypertension   . Hypothyroidism    pt states hx of thyroid nodules  . IBS (irritable bowel syndrome)   . Internal hemorrhoids   . Sleep apnea    USES CPAP - SETTING IS 14  . Statin intolerance   . Stroke [facility])    a. right thalamic infarct in 04/2016 felt by neuro to be due to small vessel disease.    Current Outpatient Medications on File Prior to Visit  Medication Sig Dispense Refill  . aspirin EC 81 MG tablet Take 1 tablet (81 mg total) by mouth daily. [place]  tablet 0  . cholecalciferol (VITAMIN D) 1000 units tablet Take 2,000 Units by mouth daily.    . Dulaglutide (TRULICITY) 1.5 UM/3.5TI SOPN INJECT CONTENTS OF PEN WEEKLY 4 pen 5  . glucose blood (ONE TOUCH ULTRA TEST) test strip USE TO CHECK BLOOD SUGAR TWICE DAILY 100 each 3  . levothyroxine (SYNTHROID, LEVOTHROID) 100 MCG tablet Take 1 tablet (100 mcg total) by mouth daily. 90 tablet 0  . Magnesium 250 MG TABS Take 1 tablet by mouth 2 (two) times daily.    . metFORMIN (GLUCOPHAGE) 1000 MG tablet TAKE 1 TABLET (1,000 MG TOTAL) BY MOUTH 2 (TWO) TIMES DAILY. 60 tablet 0  . ramipril (ALTACE) 2.5 MG capsule TAKE 1 CAPSULE (2.5 MG TOTAL) BY MOUTH DAILY. 90 capsule 2  . vitamin B-12 (CYANOCOBALAMIN) 100 MCG tablet Take 100 mcg by mouth daily.     No  current facility-administered medications on file prior to visit.     Allergies  Allergen Reactions  . Crestor [Rosuvastatin]     REACTION: N/V and heartburn  . Lipitor [Atorvastatin]     REACTION: Hot flashes and flu-like symptoms  . Niacin     REACTION: n/v  . Pravastatin Sodium     REACTION: Neck swelling and pain in her shoulders and arms.  . Sulfonamide Derivatives     Rxn Unknown  . Zetia [Ezetimibe]     GI upset  . Zocor [Simvastatin]     REACTION: GI    Assessment/Plan:  1. Hyperlipidemia -

## 2017-05-11 ENCOUNTER — Ambulatory Visit: Payer: 59 | Admitting: Pharmacist

## 2017-05-16 ENCOUNTER — Ambulatory Visit: Payer: Self-pay | Admitting: Internal Medicine

## 2017-05-17 ENCOUNTER — Other Ambulatory Visit: Payer: Self-pay | Admitting: Endocrinology

## 2017-06-05 ENCOUNTER — Ambulatory Visit (INDEPENDENT_AMBULATORY_CARE_PROVIDER_SITE_OTHER): Payer: 59 | Admitting: Pharmacist

## 2017-06-05 ENCOUNTER — Encounter (INDEPENDENT_AMBULATORY_CARE_PROVIDER_SITE_OTHER): Payer: Self-pay

## 2017-06-05 DIAGNOSIS — E782 Mixed hyperlipidemia: Secondary | ICD-10-CM

## 2017-06-05 MED ORDER — ROSUVASTATIN CALCIUM 10 MG PO TABS: 10.0000 mg | ORAL_TABLET | ORAL | 11 refills | Status: DC

## 2017-06-05 NOTE — Patient Instructions (Addendum)
It was nice to meet you today  Start taking rosuvastatin(Crestor) 10mg  every other day  We will recheck your cholesterol and liver enzymes in 2 months. Come in any time after 7:30am for fasting labs on Thursday, March 14th.  Follow up lipid appointment will be on Thursday, March 21st at [facility].  Call [facility], Pharmacist in the lipid clinic with any questions [phone]

## 2017-06-05 NOTE — Progress Notes (Signed)
Patient ID: Joanna [name]                 DOB: 10-06-1950                    MRN: [id number]     HPI: Joanna [name] is a 67 y.o. female patient referred to lipid clinic by Melina Copa, PA. 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  She reports a longstanding history of issues with statin medications. She believes that Lipitor contributed to her development of diabetes which was diagnosed about 15 years ago. She also recalls history of bladder bleeding in the past with this, hot flashes and flu-like symptoms. She had nausea/vomiting with Crestor, nausea and vomiting with niacin, myalgias with pravastatin, and GI upset with Zocor. She also recalls GI upset with Zetia as well.   Insurance has previously denied [employer]. Dr Dwyane Dee has gotten Repatha approved both in 2017 and 2018. Pt has not been interested in trying this due to fear of adverse effects. Looks as though she had agreed to try medication during a previous visit, but then becomes fearful of side effects. At most recent visit with him in July 2018, pt again refused to consider any other lipid-lowering medication.  Her LFTs have remained mildly elevated without lipid-lowering therapy due to her fatty liver disease per CT in 2014. This would not be a contraindication to statin rechallenge. She remains active and walks regularly. She knows she needs to do more of this, but in general has not been functionally limited. She has residual left arm heaviness/left leg heaviness from her stroke. She walked 3.5 miles just a few days ago without any angina. She states her mother had significant hyperlipidemia in her 26s, although she made it to 19 without any CV abnormalities. She is one of 5 siblings, several of whom have hyperlipidemia but no CV disease.  Current Medications: none Intolerances: Crestor (N/V and heartburn), Lipitor  (hot flashes and flu-like symptoms), Niacin, Zocor (GI issues), Pravachol (myalgias in her shoulders and arms), Zetia (GI upset) Risk Factors: stroke, CVD, DM, previous LDL particle # > 2500, LDL > 190 LDL goal: 70mg /dL  Diet: [employer] diet - protein shake for breakfast. Salad with protein and fruit for lunch. Vegetables and meat for dinner. Does not eat red meat or fried foods.   Exercise: Used to walk recently but broke her ankle a year ago and still has pain from this. She has started usiing the treadmill.  Family History: Mother with history of hyperlipidemia.  Social History: Denies tobacco, alcohol, and illicit drug use.  Labs: 02/2017: TC 269, TG 174, HDL 52, LDL 192, ALT 76 (no therapy) Previous particle number > 2500  Past Medical History:  Diagnosis Date  . Abnormal liver function tests   . Adenomatous colon polyp   . Cerebrovascular disease    a. CT 04/2016 -calcific plaque at the origin LEFT vertebral contributing to severe stenosis.  . Diabetes mellitus    type II--ORAL MEDICATION - NO INSULIN  . Diverticulitis of colon   . Fatty liver    a. mild fatty liver infiltration by CT 2014.  Marland Kitchen Foot injury 08   Left foot/leg with ?? torn muscle -PROBLEM HAS RESOLVED  . Hand pain 1/09   steroid injections--RESOLVED  . Hx of renal calculi    LEFT  . Hyperlipidemia   . Hypertension   .  Hypothyroidism    pt states hx of thyroid nodules  . IBS (irritable bowel syndrome)   . Internal hemorrhoids   . Sleep apnea    USES CPAP - SETTING IS 14  . Statin intolerance   . Stroke [facility])    a. right thalamic infarct in 04/2016 felt by neuro to be due to small vessel disease.    Current Outpatient Medications on File Prior to Visit  Medication Sig Dispense Refill  . aspirin EC 81 MG tablet Take 1 tablet (81 mg total) by mouth daily. 30 tablet 0  . cholecalciferol (VITAMIN D) 1000 units tablet Take 2,000 Units by mouth daily.    . Dulaglutide (TRULICITY) 1.5 XY/5.8PF SOPN INJECT  CONTENTS OF PEN WEEKLY 4 pen 5  . glucose blood (ONE TOUCH ULTRA TEST) test strip USE TO CHECK BLOOD SUGAR TWICE DAILY 100 each 3  . levothyroxine (SYNTHROID, LEVOTHROID) 100 MCG tablet Take 1 tablet (100 mcg total) by mouth daily. 90 tablet 0  . Magnesium 250 MG TABS Take 1 tablet by mouth 2 (two) times daily.    . metFORMIN (GLUCOPHAGE) 1000 MG tablet TAKE 1 TABLET (1,000 MG TOTAL) BY MOUTH 2 (TWO) TIMES DAILY. 60 tablet 0  . ramipril (ALTACE) 2.5 MG capsule TAKE 1 CAPSULE (2.5 MG TOTAL) BY MOUTH DAILY. 90 capsule 2  . vitamin B-12 (CYANOCOBALAMIN) 100 MCG tablet Take 100 mcg by mouth daily.     No current facility-administered medications on file prior to visit.     Allergies  Allergen Reactions  . Crestor [Rosuvastatin]     REACTION: N/V and heartburn  . Lipitor [Atorvastatin]     REACTION: Hot flashes and flu-like symptoms  . Niacin     REACTION: n/v  . Pravastatin Sodium     REACTION: Neck swelling and pain in her shoulders and arms.  . Sulfonamide Derivatives     Rxn Unknown  . Zetia [Ezetimibe]     GI upset  . Zocor [Simvastatin]     REACTION: GI    Assessment/Plan:  1. Hyperlipidemia - Baseline LDL 192 far above goal <70 due to hx of cerebrovascular disease and stroke. Pt is intolerant to 4 statins and Zetia. She has previously been approved for Repatha therapy but never started due to fear of side effects. She expresses again today that she does not want to take an injectable medication for her cholesterol. Discussed the need for lowering cholesterol to prevent further CVD. Pt is overall resistant to taking medications and prefers to work on improving her diet and exercise. We discussed that this would not lower her cholesterol as much as needed. She does become agreeable to rechallenging with statin therapy. Will start Crestor 10mg  every other day. ALT is chronically elevated ~75 at baseline due to fatty liver. This is not a contraindication to statin use. Will monitor  lipids and LFTs in 2 months and f/u in lipid clinic after.   Fawzi Melman E. Supple, PharmD, CPP, [place] [address], [place], [place] [address] Phone: [phone]; Fax: [phone] 06/05/2017 3:27 PM

## 2017-06-11 LAB — HM DIABETES EYE EXAM

## 2017-06-12 ENCOUNTER — Other Ambulatory Visit: Payer: Self-pay | Admitting: Endocrinology

## 2017-06-15 ENCOUNTER — Other Ambulatory Visit: Payer: Self-pay | Admitting: Endocrinology

## 2017-06-15 DIAGNOSIS — E1165 Type 2 diabetes mellitus with hyperglycemia: Secondary | ICD-10-CM

## 2017-06-15 DIAGNOSIS — E063 Autoimmune thyroiditis: Secondary | ICD-10-CM

## 2017-06-18 ENCOUNTER — Other Ambulatory Visit (INDEPENDENT_AMBULATORY_CARE_PROVIDER_SITE_OTHER): Payer: 59

## 2017-06-18 ENCOUNTER — Other Ambulatory Visit: Payer: 59

## 2017-06-18 DIAGNOSIS — E1165 Type 2 diabetes mellitus with hyperglycemia: Secondary | ICD-10-CM | POA: Diagnosis not present

## 2017-06-18 DIAGNOSIS — E063 Autoimmune thyroiditis: Secondary | ICD-10-CM

## 2017-06-18 LAB — COMPREHENSIVE METABOLIC PANEL
ALBUMIN: 4.5 g/dL (ref 3.5–5.2)
ALT: 49 U/L — AB (ref 0–35)
AST: 28 U/L (ref 0–37)
Alkaline Phosphatase: 68 U/L (ref 39–117)
BILIRUBIN TOTAL: 0.5 mg/dL (ref 0.2–1.2)
BUN: 17 mg/dL (ref 6–23)
CALCIUM: 9.6 mg/dL (ref 8.4–10.5)
CO2: 27 meq/L (ref 19–32)
CREATININE: 0.71 mg/dL (ref 0.40–1.20)
Chloride: 102 mEq/L (ref 96–112)
GFR: 87.88 mL/min (ref >60.00)
Glucose, Bld: 149 mg/dL — ABNORMAL HIGH (ref 70–99)
Potassium: 4.8 mEq/L (ref 3.5–5.1)
SODIUM: 139 meq/L (ref 135–145)
Total Protein: 7.2 g/dL (ref 6.0–8.3)

## 2017-06-18 LAB — HEMOGLOBIN A1C: HEMOGLOBIN A1C: 6.6 % — AB (ref 4.6–6.5)

## 2017-06-18 LAB — TSH: TSH: 3.41 u[IU]/mL (ref 0.35–4.50)

## 2017-06-21 ENCOUNTER — Encounter: Payer: Self-pay | Admitting: Endocrinology

## 2017-06-21 ENCOUNTER — Other Ambulatory Visit: Payer: Self-pay

## 2017-06-21 ENCOUNTER — Ambulatory Visit: Payer: 59 | Admitting: Endocrinology

## 2017-06-21 VITALS — BP 135/82 | HR 69 | Ht 63.5 in | Wt 168.6 lb

## 2017-06-21 DIAGNOSIS — E063 Autoimmune thyroiditis: Secondary | ICD-10-CM | POA: Diagnosis not present

## 2017-06-21 DIAGNOSIS — E1165 Type 2 diabetes mellitus with hyperglycemia: Secondary | ICD-10-CM | POA: Diagnosis not present

## 2017-06-21 MED ORDER — GLUCOSE BLOOD VI STRP: ORAL_STRIP | 3 refills | Status: DC

## 2017-06-21 MED ORDER — RAMIPRIL 2.5 MG PO CAPS: ORAL_CAPSULE | ORAL | 3 refills | Status: DC

## 2017-06-21 MED ORDER — METFORMIN HCL 1000 MG PO TABS: ORAL_TABLET | ORAL | 3 refills | Status: DC

## 2017-06-21 MED ORDER — DULAGLUTIDE 1.5 MG/0.5ML ~~LOC~~ SOAJ: SUBCUTANEOUS | 5 refills | Status: DC

## 2017-06-21 MED ORDER — LEVOTHYROXINE SODIUM 100 MCG PO TABS: 100.0000 ug | ORAL_TABLET | Freq: Every day | ORAL | 4 refills | Status: DC

## 2017-06-21 NOTE — Progress Notes (Signed)
Patient ID: Joanna [name], female   DOB: [birth year], 67 y.o.   MRN: [id number]           Reason for Appointment: Follow-up   Referring physician: Deborra Medina  History of Present Illness:          Diagnosis: Type 2 diabetes mellitus, date of diagnosis: 2010?       Prior history:  On her initial consultation she was advised to start Tanzeum in addition to metformin for multiple benefits including long-term control; was started in 12/15.  Recent history:     She has not been seen in follow-up since 11/2016  A1c is again 6.6, her best reading was 6.3        Non-insulin hypoglycemic drugs the patient is taking are: Metformin 1 g 2x  a day, Trulicity 1.5 mg weekly .     Glucose patterns and problems identified:  She appears to have significantly higher fasting blood sugars more recently  This is despite her A1c being about the same as before  She is able to continue taking her Trulicity as before without any nausea  However has not lost any more weight  Her exercise regimen has been inconsistent lately  She does have relatively better readings before suppertime although inconsistent, usually blood sugars are better at bedtime  Although recently has tried to do better on her diet she has not been consistent over the last 2-3 months    Side effects from medications have been: none Compliance with the medical regimen: fair    Glucose monitoring:  done once or twice a day         Glucometer: One Touch ultra mini,    Blood Glucose readings  from download   Mean values apply above for all meters except median for One Touch  PRE-MEAL Fasting Lunch Dinner Bedtime Overall  Glucose range: 150-334   92-155  101-213    Mean/median: 170  152  136  162    Previous readings:  Mean values apply above for all meters except median for One Touch  PRE-MEAL Fasting Lunch Dinner Bedtime Overall  Glucose range: 120-178    151-170    Mean/median: 148  138  128  159  150    Self-care: The diet  that the patient has been following is: tries to limit fat intake.     Meals: 3 meals per day. Breakfast is egg, toast, sometimes cereal /oatmeal.  Occasionally will have Sweets  Lunch is a sandwich or soup.  Snacks: Cottage cheese and fruit, no sweet drinks juices          Exercise: walking 1/7      Dietician visit, most recent: never     CDE visit: 1/16            Weight history:    Wt Readings from Last 3 Encounters:  06/21/17 168 lb 9.6 oz (76.5 kg)  04/10/17 169 lb 12.8 oz (77 kg)  03/19/17 167 lb 1.9 oz (75.8 kg)    Glycemic control:   Lab Results  Component Value Date   HGBA1C 6.6 (H) 06/18/2017   HGBA1C 6.6 (H) 03/15/2017   HGBA1C 6.3 12/11/2016   Lab Results  Component Value Date   MICROALBUR 1.9 03/15/2017   LDLCALC 192 (H) 03/15/2017   CREATININE 0.71 06/18/2017   Lab Results  Component Value Date   FRUCTOSAMINE 247 06/02/2016   FRUCTOSAMINE 274 (H) 07/16/2014    Past diabetes history:  For several years prior to  her diagnosis she had been complaining of her feet burning Her blood sugars were mildly increased initially at diagnosis and she did try to control it with diet alone and exercise without getting them back to normal.  Her A1c had continue to be around 7% with the lowest one 6.7  About 2012 she was started on metformin probably when her A1c was 7.3.   In 2015 her blood sugars had been higher with A1c 8.1%    HYPERLIPIDEMIA: Discussed in review of systems    Allergies as of 06/21/2017      Reactions   Crestor [rosuvastatin]    REACTION: N/V and heartburn   Lipitor [atorvastatin]    REACTION: Hot flashes and flu-like symptoms   Niacin    REACTION: n/v   Pravastatin Sodium    REACTION: Neck swelling and pain in her shoulders and arms.   Sulfonamide Derivatives    Rxn Unknown   Zetia [ezetimibe]    GI upset   Zocor [simvastatin]    REACTION: GI      Medication List        Accurate as of 06/21/17 11:59 PM. Always use your most recent  med list.          aspirin EC 81 MG tablet Take 1 tablet (81 mg total) by mouth daily.   cholecalciferol 1000 units tablet Commonly known as:  VITAMIN D Take 2,000 Units by mouth daily.   Dulaglutide 1.5 MG/0.5ML Sopn Commonly known as:  TRULICITY INJECT CONTENTS OF PEN WEEKLY   glucose blood test strip Commonly known as:  ONE TOUCH ULTRA TEST USE TO CHECK BLOOD SUGAR TWICE DAILY   levothyroxine 100 MCG tablet Commonly known as:  SYNTHROID, LEVOTHROID Take 1 tablet (100 mcg total) by mouth daily.   Magnesium 250 MG Tabs Take 1 tablet by mouth 2 (two) times daily.   metFORMIN 1000 MG tablet Commonly known as:  GLUCOPHAGE TAKE 1 TABLET (1,000 MG TOTAL) BY MOUTH 2 (TWO) TIMES DAILY.   ramipril 2.5 MG capsule Commonly known as:  ALTACE TAKE 1 CAPSULE (2.5 MG TOTAL) BY MOUTH DAILY.   rosuvastatin 10 MG tablet Commonly known as:  CRESTOR Take 1 tablet (10 mg total) by mouth every other day.   vitamin B-12 100 MCG tablet Commonly known as:  CYANOCOBALAMIN Take 100 mcg by mouth daily.       Allergies:  Allergies  Allergen Reactions  . Crestor [Rosuvastatin]     REACTION: N/V and heartburn  . Lipitor [Atorvastatin]     REACTION: Hot flashes and flu-like symptoms  . Niacin     REACTION: n/v  . Pravastatin Sodium     REACTION: Neck swelling and pain in her shoulders and arms.  . Sulfonamide Derivatives     Rxn Unknown  . Zetia [Ezetimibe]     GI upset  . Zocor [Simvastatin]     REACTION: GI    Past Medical History:  Diagnosis Date  . Abnormal liver function tests   . Adenomatous colon polyp   . Cerebrovascular disease    a. CT 04/2016 -calcific plaque at the origin LEFT vertebral contributing to severe stenosis.  . Diabetes mellitus    type II--ORAL MEDICATION - NO INSULIN  . Diverticulitis of colon   . Fatty liver    a. mild fatty liver infiltration by CT 2014.  Marland Kitchen Foot injury 08   Left foot/leg with ?? torn muscle -PROBLEM HAS RESOLVED  . Hand pain  1/09   steroid injections--RESOLVED  .  Hx of renal calculi    LEFT  . Hyperlipidemia   . Hypertension   . Hypothyroidism    pt states hx of thyroid nodules  . IBS (irritable bowel syndrome)   . Internal hemorrhoids   . Sleep apnea    USES CPAP - SETTING IS 14  . Statin intolerance   . Stroke [facility])    a. right thalamic infarct in 04/2016 felt by neuro to be due to small vessel disease.    Past Surgical History:  Procedure Laterality Date  . COLONOSCOPY W/ BIOPSIES AND POLYPECTOMY  05/19/2005   adenomatous polyps  . NEPHROLITHOTOMY Left 01/27/2013   Procedure: NEPHROLITHOTOMY PERCUTANEOUS;  Surgeon: Dutch Gray, MD;  Location: [facility];  Service: Urology;  Laterality: Left;  . TUBAL LIGATION    . WISDOM TEETH EXTRACTED      Family History  Problem Relation Age of Onset  . Hyperlipidemia Mother   . Neurofibromatosis Father   . Thyroid disease Daughter   . Hyperlipidemia Sister   . Hyperlipidemia Brother   . Diabetes Neg Hx   . Stroke Neg Hx     Social History:  reports that  has never smoked. she has never used smokeless tobacco. She reports that she does not drink alcohol or use drugs.     Review of Systems        LIPIDS: she has long-standing severe hypercholesterolemia and has been intolerant to all statin drugs with various reactions Crestor caused vomiting.  Lipitor caused flulike symptoms, abdominal bloating and hot flashes, pravastatin caused neck swelling and upper body pain, Zocor caused GI upset.   She Was not able to tolerate Zetia as she thinks it upset his stomach   The patient is wanting to go over the details of all the side effects she had with statin drugs today and how disabled she was with them  She had been explained the need for management of her lipids with medications as she has familial hypercholesterolemia  Repatha was approved through her insurance Both last year and this year  Again discussed benefits of lipid lowering Pharmacologically with her  risk factors including her thalamic infarct Discussed that the medication has much higher benefit than risk and side effects are minimal  Prescription has been sent a couple of times but she Has not been interested in trying this even though she had agreed to do this on a previous visit, she is again for afraid of side effects Today discussed with her that [place] and similar drugs have evidence that did reduce cardiovascular events even in addition to statin drugs  She again states that even though her Hyperlipidemia is familial that her mother has not had any heart problems  LDL particle number previously over 2500 with ideal levels of below 1000 Again LDL is significantly high although slightly better  She wants to try a new diet that she is doing Also taking small doses of OTC fish oil      Lab Results  Component Value Date   CHOL 269 (H) 03/15/2017   HDL 42.40 03/15/2017   LDLCALC 192 (H) 03/15/2017   LDLDIRECT 170.0 12/11/2016   TRIG 174.0 (H) 03/15/2017   CHOLHDL 6 03/15/2017               Thyroid:    She has had hypothyroidism for about 10 years and initially had a goiter also; highest TSH probably about 9.2 Currently on 100 g levothyroxine  Thyroid levels were checked by her PCP  and the dose is reduced in 02/2017 However she still complains of feeling tired and sleepy  Last TSH:    Lab Results  Component Value Date   TSH 3.41 06/18/2017   TSH 0.17 (L) 03/15/2017   TSH 1.38 08/22/2016   FREET4 1.03 02/12/2014   FREET4 1.04 12/09/2013   FREET4 0.90 09/26/2012        The blood pressure Is well-controlled with  2.5 mg ramipril  BP Readings from Last 3 Encounters:  06/21/17 135/82  04/10/17 136/80  03/19/17 (!) 134/96            LABS:  Lab on 06/18/2017  Component Date Value Ref Range Status  . TSH 06/18/2017 3.41  0.35 - 4.50 uIU/mL Final  . Sodium 06/18/2017 139  135 - 145 mEq/L Final  . Potassium 06/18/2017 4.8  3.5 - 5.1 mEq/L Final  . Chloride  06/18/2017 102  96 - 112 mEq/L Final  . CO2 06/18/2017 27  19 - 32 mEq/L Final  . Glucose, Bld 06/18/2017 149* 70 - 99 mg/dL Final  . BUN 06/18/2017 17  6 - 23 mg/dL Final  . Creatinine, Ser 06/18/2017 0.71  0.40 - 1.20 mg/dL Final  . Total Bilirubin 06/18/2017 0.5  0.2 - 1.2 mg/dL Final  . Alkaline Phosphatase 06/18/2017 68  39 - 117 U/L Final  . AST 06/18/2017 28  0 - 37 U/L Final  . ALT 06/18/2017 49* 0 - 35 U/L Final  . Total Protein 06/18/2017 7.2  6.0 - 8.3 g/dL Final  . Albumin 06/18/2017 4.5  3.5 - 5.2 g/dL Final  . Calcium 06/18/2017 9.6  8.4 - 10.5 mg/dL Final  . GFR 06/18/2017 87.88  >60.00 mL/min Final  . Hgb A1c MFr Bld 06/18/2017 6.6* 4.6 - 6.5 % Final   Glycemic Control Guidelines for People with Diabetes:Non Diabetic:  <6%Goal of Therapy: <7%Additional Action Suggested:  >8%     Physical Examination:  BP 135/82 (BP Location: Left Arm, Patient Position: Sitting, Cuff Size: Normal)   Pulse 69   Ht 5' 3.5" (1.613 m)   Wt 168 lb 9.6 oz (76.5 kg)   BMI 29.40 kg/m          ASSESSMENT/PLAN:   Diabetes type 2, with BMI 31  See history of present illness for details of her current blood sugar patterns and management  Her blood sugars are stable with Trulicity,  taking 1.5 mg daily with her metformin  She has not done as well with her diet and exercise regimen more recently Also her fasting readings are significantly high, averaging about 170  Recommendations:  Consistent diet and exercise regimen, needs to increase frequency of her exercise regimen  She will try to take both her metformin tablets with her evening meal to see if fasting readings are better  However if blood sugars are not improved especially fasting she will need to start at least 0.5 mg Amaryl at dinnertime  Needs follow-up in 3 months   Familial hypercholesterolemia with LDL usually over 200  She is now taking Crestor every other day without side effects and will continue to follow-up with  lipid clinic   HYPOTHYROIDISM: TSH is back to normal with 100 g of levothyroxine but she is still complaining of fatigue which is likely nonspecific Since TSH is trending higher will have her add the next to half tablet weekly and recheck in 3 months  Patient Instructions  Take extra 1/2 pill weeky of Synthroid  Aerobics daily  Metformin 2 at dinner     Elayne Snare 06/22/2017, 10:14 AM   Note: This office note was prepared with Dragon voice recognition system technology. Any transcriptional errors that result from this process are unintentional.

## 2017-06-21 NOTE — Patient Instructions (Addendum)
Take extra 1/2 pill weeky of Synthroid  Aerobics daily   Metformin 2 at dinner

## 2017-06-22 ENCOUNTER — Encounter: Payer: Self-pay | Admitting: Endocrinology

## 2017-06-26 ENCOUNTER — Telehealth: Payer: Self-pay | Admitting: Endocrinology

## 2017-06-26 NOTE — Telephone Encounter (Signed)
Joanna [name] from [place] called ph# [phone]. Please call to discuss synthroid generic vs brand name. Last script from Dr Dwyane Dee was for generic - patient thinks she is having side effects. Patient's PCP told patient to stay with brand synthroid. [facility] would like to discuss.

## 2017-06-26 NOTE — Telephone Encounter (Signed)
They can change it back to the brand name Synthroid instead of generic

## 2017-06-26 NOTE — Telephone Encounter (Signed)
Called [facility] and spoke to Vicente Males and let her know that per Dr. Dwyane Dee that Ms. Fass can use brand specific Synthroid and Vicente Males is going to change to make sure she stays on brand name.

## 2017-06-26 NOTE — Telephone Encounter (Signed)
Please advise. Thank you

## 2017-06-28 ENCOUNTER — Encounter: Payer: Self-pay | Admitting: Family Medicine

## 2017-08-02 ENCOUNTER — Other Ambulatory Visit: Payer: 59

## 2017-08-09 ENCOUNTER — Ambulatory Visit: Payer: 59 | Admitting: Pharmacist

## 2017-08-09 NOTE — Progress Notes (Deleted)
Patient ID: Joanna [name]                 DOB: 03-02-1951                    MRN: [id number]     HPI: Joanna [name] is a 67 y.o. female patient referred to lipid clinic by Joanna Copa, PA. 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  3/14 - was supposed to come for fasting labs. Taking crestor 10 every other day?  She reports a longstanding history of issues with statin medications. She believes that Lipitor contributed to her development of diabetes which was diagnosed about 15 years ago. She also recalls history of bladder bleeding in the past with this, hot flashes and flu-like symptoms. She had nausea/vomiting with Crestor, nausea and vomiting with niacin, myalgias with pravastatin, and GI upset with Zocor. She also recalls GI upset with Zetia as well.   Insurance has previously denied [employer]. Dr Dwyane Dee has gotten Repatha approved both in 2017 and 2018. Pt has not been interested in trying this due to fear of adverse effects. Looks as though she had agreed to try medication during a previous visit, but then becomes fearful of side effects. At most recent visit with him in July 2018, pt again refused to consider any other lipid-lowering medication.  Her LFTs have remained mildly elevated without lipid-lowering therapy due to her fatty liver disease per CT in 2014. This would not be a contraindication to statin rechallenge. She remains active and walks regularly. She knows she needs to do more of this, but in general has not been functionally limited. She has residual left arm heaviness/left leg heaviness from her stroke. She walked 3.5 miles just a few days ago without any angina. She states her mother had significant hyperlipidemia in her 58s, although she made it to 34 without any CV abnormalities. She is one of 5 siblings, several of whom have hyperlipidemia but no CV  disease.  Current Medications: none Intolerances: Crestor (N/V and heartburn), Lipitor (hot flashes and flu-like symptoms), Niacin, Zocor (GI issues), Pravachol (myalgias in her shoulders and arms), Zetia (GI upset) Risk Factors: stroke, CVD, DM, previous LDL particle # > 2500, LDL > 190 LDL goal: 70mg /dL  Diet: [employer] diet - protein shake for breakfast. Salad with protein and fruit for lunch. Vegetables and meat for dinner. Does not eat red meat or fried foods.   Exercise: Used to walk recently but broke her ankle a year ago and still has pain from this. She has started usiing the treadmill.  Family History: Mother with history of hyperlipidemia.  Social History: Denies tobacco, alcohol, and illicit drug use.  Labs:  02/2017: TC 269, TG 174, HDL 52, LDL 192, ALT 76 (no therapy) Previous particle number > 2500  Past Medical History:  Diagnosis Date  . Abnormal liver function tests   . Adenomatous colon polyp   . Cerebrovascular disease    a. CT 04/2016 -calcific plaque at the origin LEFT vertebral contributing to severe stenosis.  . Diabetes mellitus    type II--ORAL MEDICATION - NO INSULIN  . Diverticulitis of colon   . Fatty liver    a. mild fatty liver infiltration by CT 2014.  Marland Kitchen Foot injury 08   Left foot/leg with ?? torn muscle -PROBLEM HAS RESOLVED  . Hand pain 1/09   steroid injections--RESOLVED  .  Hx of renal calculi    LEFT  . Hyperlipidemia   . Hypertension   . Hypothyroidism    pt states hx of thyroid nodules  . IBS (irritable bowel syndrome)   . Internal hemorrhoids   . Sleep apnea    USES CPAP - SETTING IS 14  . Statin intolerance   . Stroke [facility])    a. right thalamic infarct in 04/2016 felt by neuro to be due to small vessel disease.    Current Outpatient Medications on File Prior to Visit  Medication Sig Dispense Refill  . aspirin EC 81 MG tablet Take 1 tablet (81 mg total) by mouth daily. 30 tablet 0  . cholecalciferol (VITAMIN D) 1000 units  tablet Take 2,000 Units by mouth daily.    . Dulaglutide (TRULICITY) 1.5 YS/0.6TK SOPN INJECT CONTENTS OF PEN WEEKLY 12 pen 5  . glucose blood (ONE TOUCH ULTRA TEST) test strip USE TO CHECK BLOOD SUGAR TWICE DAILY 300 each 3  . levothyroxine (SYNTHROID, LEVOTHROID) 100 MCG tablet Take 1 tablet (100 mcg total) by mouth daily. 90 tablet 4  . Magnesium 250 MG TABS Take 1 tablet by mouth 2 (two) times daily.    . metFORMIN (GLUCOPHAGE) 1000 MG tablet TAKE 1 TABLET (1,000 MG TOTAL) BY MOUTH 2 (TWO) TIMES DAILY. 180 tablet 3  . ramipril (ALTACE) 2.5 MG capsule TAKE 1 CAPSULE (2.5 MG TOTAL) BY MOUTH DAILY. 90 capsule 3  . rosuvastatin (CRESTOR) 10 MG tablet Take 1 tablet (10 mg total) by mouth every other day. 15 tablet 11  . vitamin B-12 (CYANOCOBALAMIN) 100 MCG tablet Take 100 mcg by mouth daily.     No current facility-administered medications on file prior to visit.     Allergies  Allergen Reactions  . Crestor [Rosuvastatin]     REACTION: N/V and heartburn  . Lipitor [Atorvastatin]     REACTION: Hot flashes and flu-like symptoms  . Niacin     REACTION: n/v  . Pravastatin Sodium     REACTION: Neck swelling and pain in her shoulders and arms.  . Sulfonamide Derivatives     Rxn Unknown  . Zetia [Ezetimibe]     GI upset  . Zocor [Simvastatin]     REACTION: GI    Assessment/Plan:  1. Hyperlipidemia - Baseline LDL 192 far above goal <70 due to hx of cerebrovascular disease and stroke. Pt is intolerant to 4 statins and Zetia. She has previously been approved for Repatha therapy but never started due to fear of side effects. She expresses again today that she does not want to take an injectable medication for her cholesterol. Discussed the need for lowering cholesterol to prevent further CVD. Pt is overall resistant to taking medications and prefers to work on improving her diet and exercise. We discussed that this would not lower her cholesterol as much as needed. She does become agreeable to  rechallenging with statin therapy. Will start Crestor 10mg  every other day. ALT is chronically elevated ~75 at baseline due to fatty liver. This is not a contraindication to statin use. Will monitor lipids and LFTs in 2 months and f/u in lipid clinic after.   Melinda Gwinner E. Supple, PharmD, CPP, [facility] [address] [place], [place] [address] Phone: [phone]; Fax: [phone] 08/09/2017 7:36 AM

## 2017-09-13 ENCOUNTER — Other Ambulatory Visit (INDEPENDENT_AMBULATORY_CARE_PROVIDER_SITE_OTHER): Payer: 59

## 2017-09-13 ENCOUNTER — Other Ambulatory Visit: Payer: Self-pay | Admitting: Endocrinology

## 2017-09-13 DIAGNOSIS — E063 Autoimmune thyroiditis: Secondary | ICD-10-CM

## 2017-09-13 DIAGNOSIS — E78019 Familial hypercholesterolemia, unspecified: Secondary | ICD-10-CM

## 2017-09-13 DIAGNOSIS — E1165 Type 2 diabetes mellitus with hyperglycemia: Secondary | ICD-10-CM

## 2017-09-13 DIAGNOSIS — E7801 Familial hypercholesterolemia: Secondary | ICD-10-CM | POA: Diagnosis not present

## 2017-09-13 LAB — LIPID PANEL
CHOL/HDL RATIO: 3
Cholesterol: 136 mg/dL (ref 0–200)
HDL: 42.8 mg/dL (ref >39.00)
LDL CALC: 70 mg/dL (ref 0–99)
NONHDL: 93.27
Triglycerides: 114 mg/dL (ref 0.0–149.0)
VLDL: 22.8 mg/dL (ref 0.0–40.0)

## 2017-09-13 LAB — HEMOGLOBIN A1C: Hgb A1c MFr Bld: 6.6 % — ABNORMAL HIGH (ref 4.6–6.5)

## 2017-09-13 LAB — TSH: TSH: 4.19 u[IU]/mL (ref 0.35–4.50)

## 2017-09-13 LAB — GLUCOSE, RANDOM: Glucose, Bld: 120 mg/dL — ABNORMAL HIGH (ref 70–99)

## 2017-09-18 ENCOUNTER — Ambulatory Visit: Payer: 59 | Admitting: Endocrinology

## 2017-09-18 ENCOUNTER — Encounter: Payer: Self-pay | Admitting: Endocrinology

## 2017-09-18 VITALS — BP 144/84 | HR 84 | Ht 63.5 in | Wt 167.2 lb

## 2017-09-18 DIAGNOSIS — E1165 Type 2 diabetes mellitus with hyperglycemia: Secondary | ICD-10-CM

## 2017-09-18 DIAGNOSIS — E7801 Familial hypercholesterolemia: Secondary | ICD-10-CM | POA: Diagnosis not present

## 2017-09-18 MED ORDER — GLIMEPIRIDE 1 MG PO TABS: 1.0000 mg | ORAL_TABLET | Freq: Every day | ORAL | 1 refills | Status: DC

## 2017-09-18 NOTE — Patient Instructions (Signed)
Take 1/2 Glimepride at bedtime

## 2017-09-18 NOTE — Progress Notes (Signed)
Patient ID: Joanna [name], female   DOB: 01-04-1951, 67 y.o.   MRN: [id number]           Reason for Appointment: Follow-up   Referring physician: Deborra Medina  History of Present Illness:          Diagnosis: Type 2 diabetes mellitus, date of diagnosis: 2010?       Prior history:  On her initial consultation she was advised to start Tanzeum in addition to metformin for multiple benefits including long-term control; was started in 12/15.  Recent history:   A1c is again 6.6, her lowest level was 6.3        Non-insulin hypoglycemic drugs the patient is taking are: Metformin 1 g 2x  a day, Trulicity 1.5 mg weekly .     Glucose patterns and problems identified:  She continues to have consistently high fasting blood sugars and overall about the same as on the last visit  She does not check readings after her evening meal very regularly but they are usually not significantly high  Blood sugars are the lowest in the late afternoon but lowest blood sugar is only 125  She has been irregular with exercise although she thinks she can start an exercise bike soon  Again she is trying to be very careful with her diet with cutting back on carbohydrate intake and increasing fiber and vegetables  Her weight has not changed recently    Side effects from medications have been: none Compliance with the medical regimen: fair    Glucose monitoring:  done once or twice a day         Glucometer: One Touch ultra mini,    Blood Glucose readings  from download   Mean values apply above for all meters except median for One Touch  PRE-MEAL Fasting Lunch Dinner Bedtime Overall  Glucose range:  135-204      Mean/median:  172    171   POST-MEAL PC Breakfast PC Lunch PC Dinner  Glucose range:    149-302  Mean/median:   144  169     Self-care: The diet that the patient has been following is: tries to limit fat intake.     Meals: 3 meals per day. Breakfast is egg, toast, sometimes cereal /oatmeal.   Occasionally will have Sweets  Lunch is a sandwich or soup.  Snacks: Cottage cheese and fruit, no sweet drinks juices          Exercise: none      Dietician visit, most recent: never     CDE visit: 1/16            Weight history:    Wt Readings from Last 3 Encounters:  09/18/17 167 lb 3.2 oz (75.8 kg)  06/21/17 168 lb 9.6 oz (76.5 kg)  04/10/17 169 lb 12.8 oz (77 kg)    Glycemic control:   Lab Results  Component Value Date   HGBA1C 6.6 (H) 09/13/2017   HGBA1C 6.6 (H) 06/18/2017   HGBA1C 6.6 (H) 03/15/2017   Lab Results  Component Value Date   MICROALBUR 1.9 03/15/2017   LDLCALC 70 09/13/2017   CREATININE 0.71 06/18/2017   Lab Results  Component Value Date   FRUCTOSAMINE 247 06/02/2016   FRUCTOSAMINE 274 (H) 07/16/2014    Past diabetes history:  For several years prior to her diagnosis she had been complaining of her feet burning Her blood sugars were mildly increased initially at diagnosis and she did try to control it with diet  alone and exercise without getting them back to normal.  Her A1c had continue to be around 7% with the lowest one 6.7  About 2012 she was started on metformin probably when her A1c was 7.3.   In 2015 her blood sugars had been higher with A1c 8.1%  HYPERLIPIDEMIA: Discussed in review of systems    Allergies as of 09/18/2017      Reactions   Crestor [rosuvastatin]    REACTION: N/V and heartburn   Lipitor [atorvastatin]    REACTION: Hot flashes and flu-like symptoms   Niacin    REACTION: n/v   Pravastatin Sodium    REACTION: Neck swelling and pain in her shoulders and arms.   Sulfonamide Derivatives    Rxn Unknown   Zetia [ezetimibe]    GI upset   Zocor [simvastatin]    REACTION: GI      Medication List        Accurate as of 09/18/17  3:31 PM. Always use your most recent med list.          aspirin EC 81 MG tablet Take 1 tablet (81 mg total) by mouth daily.   cholecalciferol 1000 units tablet Commonly known as:  VITAMIN  D Take 2,000 Units by mouth daily.   Dulaglutide 1.5 MG/0.5ML Sopn Commonly known as:  TRULICITY INJECT CONTENTS OF PEN WEEKLY   glucose blood test strip Commonly known as:  ONE TOUCH ULTRA TEST USE TO CHECK BLOOD SUGAR TWICE DAILY   levothyroxine 100 MCG tablet Commonly known as:  SYNTHROID, LEVOTHROID Take 1 tablet (100 mcg total) by mouth daily.   Magnesium 250 MG Tabs Take 1 tablet by mouth 2 (two) times daily.   metFORMIN 1000 MG tablet Commonly known as:  GLUCOPHAGE TAKE 1 TABLET (1,000 MG TOTAL) BY MOUTH 2 (TWO) TIMES DAILY.   ramipril 2.5 MG capsule Commonly known as:  ALTACE TAKE 1 CAPSULE (2.5 MG TOTAL) BY MOUTH DAILY.   rosuvastatin 10 MG tablet Commonly known as:  CRESTOR Take 1 tablet (10 mg total) by mouth every other day.   vitamin B-12 100 MCG tablet Commonly known as:  CYANOCOBALAMIN Take 100 mcg by mouth daily.       Allergies:  Allergies  Allergen Reactions  . Crestor [Rosuvastatin]     REACTION: N/V and heartburn  . Lipitor [Atorvastatin]     REACTION: Hot flashes and flu-like symptoms  . Niacin     REACTION: n/v  . Pravastatin Sodium     REACTION: Neck swelling and pain in her shoulders and arms.  . Sulfonamide Derivatives     Rxn Unknown  . Zetia [Ezetimibe]     GI upset  . Zocor [Simvastatin]     REACTION: GI    Past Medical History:  Diagnosis Date  . Abnormal liver function tests   . Adenomatous colon polyp   . Cerebrovascular disease    a. CT 04/2016 -calcific plaque at the origin LEFT vertebral contributing to severe stenosis.  . Diabetes mellitus    type II--ORAL MEDICATION - NO INSULIN  . Diverticulitis of colon   . Fatty liver    a. mild fatty liver infiltration by CT 2014.  Marland Kitchen Foot injury 08   Left foot/leg with ?? torn muscle -PROBLEM HAS RESOLVED  . Hand pain 1/09   steroid injections--RESOLVED  . Hx of renal calculi    LEFT  . Hyperlipidemia   . Hypertension   . Hypothyroidism    pt states hx of thyroid  nodules  .  IBS (irritable bowel syndrome)   . Internal hemorrhoids   . Sleep apnea    USES CPAP - SETTING IS 14  . Statin intolerance   . Stroke [facility])    a. right thalamic infarct in 04/2016 felt by neuro to be due to small vessel disease.    Past Surgical History:  Procedure Laterality Date  . COLONOSCOPY W/ BIOPSIES AND POLYPECTOMY  05/19/2005   adenomatous polyps  . NEPHROLITHOTOMY Left 01/27/2013   Procedure: NEPHROLITHOTOMY PERCUTANEOUS;  Surgeon: Dutch Gray, MD;  Location: [facility];  Service: Urology;  Laterality: Left;  . TUBAL LIGATION    . WISDOM TEETH EXTRACTED      Family History  Problem Relation Age of Onset  . Hyperlipidemia Mother   . Neurofibromatosis Father   . Thyroid disease Daughter   . Hyperlipidemia Sister   . Hyperlipidemia Brother   . Diabetes Neg Hx   . Stroke Neg Hx     Social History:  reports that she has never smoked. She has never used smokeless tobacco. She reports that she does not drink alcohol or use drugs.     Review of Systems        LIPIDS: she has long-standing severe hypercholesterolemia and has been intolerant to all statin drugs with various reactions Lipitor caused flulike symptoms, abdominal bloating and hot flashes, pravastatin caused neck swelling and upper body pain, Zocor caused GI upset.    She Was not able to tolerate Zetia as she thinks it upset his stomach    LDL particle number previously over 2500 with ideal levels of below 1000  Currently taking 5 mg Crestor every other day since the beginning of the year Although she had stopped this previously because of complaints of nausea and heartburn she only has minor heartburn with this now  Her lipids are remarkably better  Also taking small doses of OTC fish oil      Lab Results  Component Value Date   CHOL 136 09/13/2017   HDL 42.80 09/13/2017   LDLCALC 70 09/13/2017   LDLDIRECT 170.0 12/11/2016   TRIG 114.0 09/13/2017   CHOLHDL 3 09/13/2017                Thyroid:    She has had hypothyroidism for about 10 years and initially had a goiter also; highest TSH probably about 9.2 Currently on 100 g levothyroxine since 10/18 when the dose was reduced  This is followed by PCP  Last TSH:    Lab Results  Component Value Date   TSH 4.19 09/13/2017   TSH 3.41 06/18/2017   TSH 0.17 (L) 03/15/2017   FREET4 1.03 02/12/2014   FREET4 1.04 12/09/2013   FREET4 0.90 09/26/2012        The blood pressure is being treated with  2.5 mg ramipril She thinks her blood pressure is high today from being stressed and she thinks the blood pressure is better at the drugstore  BP Readings from Last 3 Encounters:  09/18/17 (!) 144/84  06/21/17 135/82  04/10/17 136/80            LABS:  Lab on 09/13/2017  Component Date Value Ref Range Status  . TSH 09/13/2017 4.19  0.35 - 4.50 uIU/mL Final  . Glucose, Bld 09/13/2017 120* 70 - 99 mg/dL Final  . Hgb A1c MFr Bld 09/13/2017 6.6* 4.6 - 6.5 % Final   Glycemic Control Guidelines for People with Diabetes:Non Diabetic:  <6%Goal of Therapy: <7%Additional Action Suggested:  >8%   .  Cholesterol 09/13/2017 136  0 - 200 mg/dL Final   ATP III Classification       Desirable:  < 200 mg/dL               Borderline High:  200 - 239 mg/dL          High:  > = 240 mg/dL  . Triglycerides 09/13/2017 114.0  0.0 - 149.0 mg/dL Final   Normal:  <150 mg/dLBorderline High:  150 - 199 mg/dL  . HDL 09/13/2017 42.80  >39.00 mg/dL Final  . VLDL 09/13/2017 22.8  0.0 - 40.0 mg/dL Final  . LDL Cholesterol 09/13/2017 70  0 - 99 mg/dL Final  . Total CHOL/HDL Ratio 09/13/2017 3   Final                  Men          Women1/2 Average Risk     3.4          3.3Average Risk          5.0          4.42X Average Risk          9.6          7.13X Average Risk          15.0          11.0                      . NonHDL 09/13/2017 93.27   Final   NOTE:  Non-HDL goal should be 30 mg/dL higher than patient's LDL goal (i.e. LDL goal of < 70 mg/dL, would  have non-HDL goal of < 100 mg/dL)    Physical Examination:  BP (!) 144/84 (BP Location: Left Arm, Patient Position: Sitting, Cuff Size: Normal)   Pulse 84   Ht 5' 3.5" (1.613 m)   Wt 167 lb 3.2 oz (75.8 kg)   SpO2 98%   BMI 29.15 kg/m          Diabetic Foot Exam - Simple   Simple Foot Form Diabetic Foot exam was performed with the following findings:  Yes   Visual Inspection No deformities, no ulcerations, no other skin breakdown bilaterally:  Yes Sensation Testing Intact to touch and monofilament testing bilaterally:  Yes Pulse Check Posterior Tibialis and Dorsalis pulse intact bilaterally:  Yes Comments     ASSESSMENT/PLAN:   Diabetes type 2 with recent BMI 29  See history of present illness for details of her current blood sugar patterns and management  Although her A1c is only 6.6 her blood sugars are averaging about 170 at home and fairly consistently high fasting She has difficulty losing weight Not consistent with exercise although doing very well with diet   Recommendations:  Trial of Amaryl 1 mg, half tablet at dinnertime to help control blood sugars after supper and overnight  If she has high readings consistently in the morning after 1 week she can take the full tablet  Consistent exercise  Follow-up in 4 months   Familial hypercholesterolemia with LDL usually over 200  She is now getting much better results with taking Crestor every other day without side effects  HYPOTHYROIDISM: We will recheck levels on the next visit  There are no Patient Instructions on file for this visit.    Elayne Snare 09/18/2017, 3:31 PM   Note: This office note was prepared with Dragon voice recognition system technology. Any transcriptional errors that result  from this process are unintentional.

## 2017-09-20 ENCOUNTER — Telehealth: Payer: Self-pay | Admitting: Physician Assistant

## 2017-09-20 NOTE — Telephone Encounter (Signed)
Returned pts called and left her a detailed message that we have viewed her lab results from Dr. Ronnie Derby office and that we think it's an exceptional improvement and we will keep her medication the same. Advised pt if she had any questions, to call our office.

## 2017-09-20 NOTE — Telephone Encounter (Signed)
Please inform patient of pharmacy's thoughts.  Khup Sapia PA-C

## 2017-09-20 NOTE — Telephone Encounter (Signed)
Agree - exceptional improvement in LDL from 192 to 70 since starting rosuvastatin 10mg  every other day. Continue current therapy since LDL is at goal < 70.

## 2017-09-20 NOTE — Telephone Encounter (Signed)
New message    Pt verbalized that she want Dayna to review Dr.Kumar her endocrinologist  Lab results and findings pertaining to her cholesterol

## 2017-09-20 NOTE — Telephone Encounter (Signed)
Lipid profile appears much improved to my eye, but is now followed in our lipid clinic - appears she missed f/u on 3/14 and 3/21 in our office. Will forward to pharmD for further review/recs. Dayna Dunn PA-C

## 2017-10-06 ENCOUNTER — Other Ambulatory Visit: Payer: Self-pay | Admitting: Endocrinology

## 2017-10-12 ENCOUNTER — Telehealth: Payer: Self-pay | Admitting: Endocrinology

## 2017-10-12 NOTE — Telephone Encounter (Signed)
Patient need dx code on medications Trulicity, Metformin, Test strips for the one touch meter. Please advise

## 2017-10-16 ENCOUNTER — Other Ambulatory Visit: Payer: Self-pay

## 2017-10-16 MED ORDER — GLUCOSE BLOOD VI STRP: ORAL_STRIP | 3 refills | Status: DC

## 2017-10-16 MED ORDER — DULAGLUTIDE 1.5 MG/0.5ML ~~LOC~~ SOAJ: SUBCUTANEOUS | 5 refills | Status: DC

## 2017-10-16 MED ORDER — METFORMIN HCL 1000 MG PO TABS: ORAL_TABLET | ORAL | 3 refills | Status: DC

## 2017-10-16 NOTE — Telephone Encounter (Signed)
Medications sent to pharmacy per patient request. 

## 2017-10-22 ENCOUNTER — Other Ambulatory Visit: Payer: Self-pay

## 2017-10-22 ENCOUNTER — Telehealth: Payer: Self-pay | Admitting: Family Medicine

## 2017-10-22 MED ORDER — GLUCOSE BLOOD VI STRP: ORAL_STRIP | 3 refills | Status: DC

## 2017-10-22 NOTE — Telephone Encounter (Signed)
Copied from Todd. Topic: Quick Communication - See Telephone Encounter >> Oct 22, 2017 10:39 AM Synthia Innocent wrote: CRM for notification. See Telephone encounter for: 10/22/17. Needing DX code on diabetic testing supplies

## 2017-10-22 NOTE — Telephone Encounter (Signed)
Please see refill request.

## 2017-10-22 NOTE — Telephone Encounter (Signed)
Please enter E [phone]

## 2017-11-01 ENCOUNTER — Other Ambulatory Visit: Payer: Self-pay | Admitting: Endocrinology

## 2017-12-30 ENCOUNTER — Other Ambulatory Visit: Payer: Self-pay | Admitting: Endocrinology

## 2018-01-29 ENCOUNTER — Other Ambulatory Visit: Payer: 59

## 2018-01-31 ENCOUNTER — Ambulatory Visit: Payer: 59 | Admitting: Endocrinology

## 2018-02-13 ENCOUNTER — Telehealth: Payer: Self-pay | Admitting: Physician Assistant

## 2018-02-13 NOTE — Telephone Encounter (Signed)
Spoke to patient who called and said that she is having L arm pain and heavy, numbness in Left leg ankle area.  These symptoms are intermittent.  She has no other symptoms like CP, only mild SOB.  She had a stroke around Christmas 2018, which affected this side of the body.    She was started on Rosuvastatin in February, but only took it through April because it caused her anxiety and stomach problems.  Her cholesterol had improved in April.  She has made lifestyle changes since then, because she has statin problems.  She has an appt with Dayna 10/29 and I told her that we would discuss further treatment at that time, unless recommended earlier. I told her to contact her neurologist/ PCP and describe these symptoms.  She verbalized understanding and will go to the ED if symptoms worsen.

## 2018-02-13 NOTE — Telephone Encounter (Signed)
° °  Patient calling to request stress test.  Patient states she has been having arm pressure, leg pain on exertion " heavy feeling" No chest pain

## 2018-02-13 NOTE — Telephone Encounter (Signed)
Spoke to patient and gave her Dayna's recommendations.  She verbalized understanding and I moved her appt to 10/1 with Vin.

## 2018-02-13 NOTE — Telephone Encounter (Signed)
She previously noted left arm heaviness but it was regardless of activity and she was able to walk several miles without any complaints. If this is progressing and now occurring with exertion, recommend proceed to ER as this could represent an anginal equivalent and may need expedited workup- Her high cholesterol puts her at risk for adverse CV events. If this is similar and unchanged to prior symptoms, please see if f/u appt can be otherwise moved up in our office and I also suggest she call her PCP to be seen ASAP. Would have a low threshold to seek care given her history. Dayna Dunn PA-C

## 2018-02-19 ENCOUNTER — Ambulatory Visit: Payer: Medicare Other | Admitting: Physician Assistant

## 2018-02-19 NOTE — Progress Notes (Deleted)
Cardiology Office Note    Date:  02/19/2018   ID:  Joanna [name], DOB February 26, 1951, MRN [id number]  PCP:  [name], MD  Cardiologist: Dr. Harrington Challenger  Chief Complaint: SOB  History of Present Illness:   Joanna [name] is a 67 y.o. female with history of hypothyroidism, DM, HTN, HLD, right thalamic infarct 04/2016, cerebrovascular disease, abnormal LFTs, OSA presents for SOB evaluation.   Previously seen 03/2017 for hyperlipidemia with documented statin intolerance. EKG non specific ST/T wave changes. Seen in clinic clinic. LDL improved on Rosuvastatin 10mg  every other day.      Past Medical History:  Diagnosis Date  . Abnormal liver function tests   . Adenomatous colon polyp   . Cerebrovascular disease    a. CT 04/2016 -calcific plaque at the origin LEFT vertebral contributing to severe stenosis.  . Diabetes mellitus    type II--ORAL MEDICATION - NO INSULIN  . Diverticulitis of colon   . Fatty liver    a. mild fatty liver infiltration by CT 2014.  Marland Kitchen Foot injury 08   Left foot/leg with ?? torn muscle -PROBLEM HAS RESOLVED  . Hand pain 1/09   steroid injections--RESOLVED  . Hx of renal calculi    LEFT  . Hyperlipidemia   . Hypertension   . Hypothyroidism    pt states hx of thyroid nodules  . IBS (irritable bowel syndrome)   . Internal hemorrhoids   . Sleep apnea    USES CPAP - SETTING IS 14  . Statin intolerance   . Stroke [facility])    a. right thalamic infarct in 04/2016 felt by neuro to be due to small vessel disease.    Past Surgical History:  Procedure Laterality Date  . COLONOSCOPY W/ BIOPSIES AND POLYPECTOMY  05/19/2005   adenomatous polyps  . NEPHROLITHOTOMY Left 01/27/2013   Procedure: NEPHROLITHOTOMY PERCUTANEOUS;  Surgeon: Dutch Gray, MD;  Location: [facility];  Service: Urology;  Laterality: Left;  . TUBAL LIGATION    . WISDOM TEETH EXTRACTED      Current Medications: Prior to Admission medications   Medication Sig Start Date End Date Taking? Authorizing  Provider  aspirin EC 81 MG tablet Take 1 tablet (81 mg total) by mouth daily. 05/11/16   Dessa Phi, DO  cholecalciferol (VITAMIN D) 1000 units tablet Take 2,000 Units by mouth daily.    [provider]  Dulaglutide (TRULICITY) 1.5 BH/4.1PF SOPN INJECT CONTENTS OF PEN WEEKLY 10/16/17   Elayne Snare, MD  glimepiride (AMARYL) 1 MG tablet TAKE 1 TABLET (1 MG TOTAL) BY MOUTH AT BEDTIME. 12/31/17   Elayne Snare, MD  glucose blood (ONE TOUCH ULTRA TEST) test strip USE TO CHECK BLOOD SUGAR TWICE DAILY  Dx:E11.65 10/22/17   Elayne Snare, MD  levothyroxine (SYNTHROID, LEVOTHROID) 100 MCG tablet Take 1 tablet (100 mcg total) by mouth daily. 06/21/17   Elayne Snare, MD  Magnesium 250 MG TABS Take 1 tablet by mouth 2 (two) times daily.    [provider]  metFORMIN (GLUCOPHAGE) 1000 MG tablet TAKE 1 TABLET (1,000 MG TOTAL) BY MOUTH 2 (TWO) TIMES DAILY. 10/16/17   Elayne Snare, MD  ramipril (ALTACE) 2.5 MG capsule TAKE 1 CAPSULE (2.5 MG TOTAL) BY MOUTH DAILY. 06/21/17   Elayne Snare, MD  rosuvastatin (CRESTOR) 10 MG tablet Take 1 tablet (10 mg total) by mouth every other day. 06/05/17   Dunn, Nedra Hai, PA-C  vitamin B-12 (CYANOCOBALAMIN) 100 MCG tablet Take 100 mcg by mouth daily.    [provider]    Allergies:   Crestor [rosuvastatin]; Lipitor [atorvastatin]; Niacin; Pravastatin sodium; Sulfonamide derivatives; Zetia [ezetimibe]; and Zocor [simvastatin]   Social History   Socioeconomic History  . Marital status: Married    Spouse name: Not on file  . Number of children: 3  . Years of education: Not on file  . Highest education level: Not on file  Occupational History  . Occupation: Producer, television/film/video: [employer]    Comment: sits most of day   Social Needs  . Financial resource strain: Not on file  . Food insecurity:    Worry: Not on file    Inability: Not on file  . Transportation needs:    Medical: Not on file    Non-medical: Not on file  Tobacco Use  . Smoking  status: Never Smoker  . Smokeless tobacco: Never Used  Substance and Sexual Activity  . Alcohol use: No  . Drug use: No  . Sexual activity: Not on file  Lifestyle  . Physical activity:    Days per week: Not on file    Minutes per session: Not on file  . Stress: Not on file  Relationships  . Social connections:    Talks on phone: Not on file    Gets together: Not on file    Attends religious service: Not on file    Active member of club or organization: Not on file    Attends meetings of clubs or organizations: Not on file    Relationship status: Not on file  Other Topics Concern  . Not on file  Social History Narrative   Married, one son to daughters. She does office work. One caffeinated drink daily.   Updated as of 04/30/2013     Family History:  The patient's family history includes Hyperlipidemia in her brother, mother, and sister; Neurofibromatosis in her father; Thyroid disease in her daughter. ***  ROS:   Please see the history of present illness.    ROS All other systems reviewed and are negative.   PHYSICAL EXAM:   VS:  There were no vitals taken for this visit.   GEN: Well nourished, well developed, in no acute distress  HEENT: normal  Neck: no JVD, carotid bruits, or masses Cardiac: ***RRR; no murmurs, rubs, or gallops,no edema  Respiratory:  clear to auscultation bilaterally, normal work of breathing GI: soft, nontender, nondistended, + BS MS: no deformity or atrophy  Skin: warm and dry, no rash Neuro:  Alert and Oriented x 3, Strength and sensation are intact Psych: euthymic mood, full affect  Wt Readings from Last 3 Encounters:  09/18/17 167 lb 3.2 oz (75.8 kg)  06/21/17 168 lb 9.6 oz (76.5 kg)  04/10/17 169 lb 12.8 oz (77 kg)      Studies/Labs Reviewed:   EKG:  EKG is ordered today.  The ekg ordered today demonstrates ***  Recent Labs: 06/18/2017: ALT 49; BUN 17; Creatinine, Ser 0.71; Potassium 4.8; Sodium 139 09/13/2017: TSH 4.19   Lipid  Panel    Component Value Date/Time   CHOL 136 09/13/2017 0817   TRIG 114.0 09/13/2017 0817   HDL 42.80 09/13/2017 0817   CHOLHDL 3 09/13/2017 0817   VLDL 22.8 09/13/2017 0817   LDLCALC 70 09/13/2017 0817   LDLDIRECT 170.0 12/11/2016 0943    Additional studies/ records that were reviewed today include:   Echocardiogram: 04/2016 Study Conclusions  - Left ventricle: The cavity size was normal. Wall thickness was   increased  in a pattern of mild LVH. Systolic function was normal.   Wall motion was normal; there were no regional wall motion   abnormalities. Doppler parameters are consistent with abnormal   left ventricular relaxation (grade 1 diastolic dysfunction).   Doppler parameters are consistent with high ventricular filling   pressure. - Aortic valve: Transvalvular velocity was within the normal range.   There was no stenosis. There was no regurgitation. - Mitral valve: Calcified annulus. Transvalvular velocity was   within the normal range. There was no evidence for stenosis.   There was no regurgitation. - Right ventricle: The cavity size was normal. Wall thickness was   normal. Systolic function was normal. - Tricuspid valve: There was trivial regurgitation. - Pulmonary arteries: Systolic pressure was within the normal   range.    ASSESSMENT & PLAN:    1. ***    Medication Adjustments/Labs and Tests Ordered: Current medicines are reviewed at length with the patient today.  Concerns regarding medicines are outlined above.  Medication changes, Labs and Tests ordered today are listed in the Patient Instructions below. There are no Patient Instructions on file for this visit.   Jarrett Soho, [place]  02/19/2018 8:27 AM    [facility] [place], [place], [place]  [address] Phone: [phone]; Fax: [phone]

## 2018-02-25 ENCOUNTER — Encounter: Payer: Self-pay | Admitting: Internal Medicine

## 2018-02-26 ENCOUNTER — Other Ambulatory Visit: Payer: Self-pay | Admitting: Endocrinology

## 2018-03-05 ENCOUNTER — Ambulatory Visit (INDEPENDENT_AMBULATORY_CARE_PROVIDER_SITE_OTHER): Payer: PRIVATE HEALTH INSURANCE | Admitting: Gastroenterology

## 2018-03-05 ENCOUNTER — Telehealth: Payer: Self-pay | Admitting: *Deleted

## 2018-03-05 ENCOUNTER — Encounter: Payer: Self-pay | Admitting: Gastroenterology

## 2018-03-05 VITALS — BP 120/84 | HR 76 | Ht 63.5 in | Wt 172.8 lb

## 2018-03-05 DIAGNOSIS — L290 Pruritus ani: Secondary | ICD-10-CM | POA: Diagnosis not present

## 2018-03-05 DIAGNOSIS — Z86010 Personal history of colonic polyps: Secondary | ICD-10-CM

## 2018-03-05 MED ORDER — HYDROCORTISONE 2.5 % RE CREA: TOPICAL_CREAM | RECTAL | 1 refills | Status: DC

## 2018-03-05 NOTE — Patient Instructions (Addendum)
We will sent a prescription to your pharmacy, [facility]. 1. Hydrocortisone cream 2.5 %.  Consider daily fiber such as Benefiber or Citrucel. 2 tsp in 8 oz of water or liquid.  You can use Desitin cream or ointment to perianal area as needed.   We will call you when the December schedule is out for the colonoscopy with Dr, Silvano Rusk.  Ask to speak to [place]. She can print out your directions.   Normal BMI (Body Mass Index- based on height and weight) is between 23 and 30. Your BMI today is Body mass index is 30.13 kg/m. Marland Kitchen Please consider follow up  regarding your BMI with your Primary Care Provider.

## 2018-03-05 NOTE — Progress Notes (Signed)
03/05/2018 Joanna [name] [id number] 1950-12-18   HISTORY OF PRESENT ILLNESS: This is a 67 year old female who is a patient of Dr. Celesta Aver.  Colonoscopy with Dr. Arelia Longest December 2014 showed moderate diverticulosis and 2 polyps were removed that were tubular adenomas.  Repeat recommended 5-year interval.  She is here today to discuss her recall colonoscopy.  She says that her bowel habits tend to alternate.  Some days she will have multiple bowel movements daily then other times she will have several days between bowel movements.  Denies any abdominal pain.  Admits to occasional abdominal bloating.  Complains of intermittent rectal pain that she describes as a sharp sensation or spasm.  Complains of perianal itching.  Sees very small amounts of bright red blood streaks on the toilet paper upon wiping.  She says that she has been using over-the-counter hydrocortisone cream on her perianal area, but is asking for prescription strength as she is used this in the past.   Past Medical History:  Diagnosis Date  . Abnormal liver function tests   . Adenomatous colon polyp   . Cerebrovascular disease    a. CT 04/2016 -calcific plaque at the origin LEFT vertebral contributing to severe stenosis.  . Diabetes mellitus    type II--ORAL MEDICATION - NO INSULIN  . Diverticulitis of colon   . Fatty liver    a. mild fatty liver infiltration by CT 2014.  Marland Kitchen Foot injury 08   Left foot/leg with ?? torn muscle -PROBLEM HAS RESOLVED  . Hand pain 1/09   steroid injections--RESOLVED  . Hx of renal calculi    LEFT  . Hyperlipidemia   . Hypertension   . Hypothyroidism    pt states hx of thyroid nodules  . IBS (irritable bowel syndrome)   . Internal hemorrhoids   . Sleep apnea    USES CPAP - SETTING IS 14  . Statin intolerance   . Stroke [facility])    a. right thalamic infarct in 04/2016 felt by neuro to be due to small vessel disease.   Past Surgical History:  Procedure Laterality Date  . COLONOSCOPY  W/ BIOPSIES AND POLYPECTOMY  05/19/2005   adenomatous polyps  . NEPHROLITHOTOMY Left 01/27/2013   Procedure: NEPHROLITHOTOMY PERCUTANEOUS;  Surgeon: Dutch Gray, MD;  Location: [facility];  Service: Urology;  Laterality: Left;  . TUBAL LIGATION    . WISDOM TEETH EXTRACTED      reports that she has never smoked. She has never used smokeless tobacco. She reports that she does not drink alcohol or use drugs. family history includes Hyperlipidemia in her brother, mother, and sister; Neurofibromatosis in her father; Thyroid disease in her daughter. Allergies  Allergen Reactions  . Crestor [Rosuvastatin]     REACTION: N/V and heartburn  . Lipitor [Atorvastatin]     REACTION: Hot flashes and flu-like symptoms  . Niacin     REACTION: n/v  . Pravastatin Sodium     REACTION: Neck swelling and pain in her shoulders and arms.  . Sulfonamide Derivatives     Rxn Unknown  . Zetia [Ezetimibe]     GI upset  . Zocor [Simvastatin]     REACTION: GI      Outpatient Encounter Medications as of 03/05/2018  Medication Sig  . aspirin EC 81 MG tablet Take 1 tablet (81 mg total) by mouth daily.  . cholecalciferol (VITAMIN D) 1000 units tablet Take 2,000 Units by mouth daily.  . Dulaglutide (TRULICITY) 1.5 MV/6.7MC SOPN INJECT CONTENTS OF PEN  WEEKLY  . glimepiride (AMARYL) 1 MG tablet TAKE 1 TABLET (1 MG TOTAL) BY MOUTH AT BEDTIME.  Marland Kitchen glucose blood (ONE TOUCH ULTRA TEST) test strip USE TO CHECK BLOOD SUGAR TWICE DAILY  Dx:E11.65  . levothyroxine (SYNTHROID, LEVOTHROID) 100 MCG tablet Take 1 tablet (100 mcg total) by mouth daily.  . Magnesium 250 MG TABS Take 1 tablet by mouth 2 (two) times daily.  . metFORMIN (GLUCOPHAGE) 1000 MG tablet TAKE 1 TABLET (1,000 MG TOTAL) BY MOUTH 2 (TWO) TIMES DAILY.  . ramipril (ALTACE) 2.5 MG capsule TAKE 1 CAPSULE (2.5 MG TOTAL) BY MOUTH DAILY.  . [DISCONTINUED] rosuvastatin (CRESTOR) 10 MG tablet Take 1 tablet (10 mg total) by mouth every other day.  . [DISCONTINUED] vitamin  B-12 (CYANOCOBALAMIN) 100 MCG tablet Take 100 mcg by mouth daily.   No facility-administered encounter medications on file as of 03/05/2018.      REVIEW OF SYSTEMS  : All other systems reviewed and negative except where noted in the History of Present Illness.   PHYSICAL EXAM: BP 120/84   Pulse 76   Ht 5' 3.5" (1.613 m)   Wt 172 lb 12.8 oz (78.4 kg)   BMI 30.13 kg/m  General: Well developed white female in no acute distress Head: Normocephalic and atraumatic Eyes:  Sclerae anicteric, conjunctiva pink. Ears: Normal auditory acuity  Lungs: Clear throughout to auscultation; no increased WOB. Heart: Regular rate and rhythm; no M/R/G. Abdomen: Soft, non-distended.  BS present.  Non-tender. Rectal:  Will be done at the time of colonoscopy.  Patient declined exam today. Musculoskeletal: Symmetrical with no gross deformities  Skin: No lesions on visible extremities Extremities: No edema  Neurological: Alert oriented x 4, grossly non-focal Psychological:  Alert and cooperative. Normal mood and affect  ASSESSMENT AND PLAN: *Personal history of adenomatous colon polyps in December 2014: We will schedule for surveillance colonoscopy with Dr. Carlean Purl. *Alternating bowel habits:  Will consider trying a daily powder fiber supplement such as Benefiber or Citrucel, 2 teaspoons mixed in 8 ounces of liquid daily to start to see if this will help regulate her bowel habits. *Perianal itching/irritation:  Will prescribe hydrocortisone cream 2.5% to use prn, but I also recommended that she try Desitin/zinc oxide as well.  **The risks, benefits, and alternatives to colonoscopy were discussed with the patient and she consents to proceed.   CC:  Joanna Passy, MD

## 2018-03-07 DIAGNOSIS — L290 Pruritus ani: Secondary | ICD-10-CM | POA: Insufficient documentation

## 2018-03-11 ENCOUNTER — Encounter: Payer: Self-pay | Admitting: Internal Medicine

## 2018-03-11 NOTE — Telephone Encounter (Signed)
Created in error

## 2018-03-15 ENCOUNTER — Telehealth: Payer: Self-pay | Admitting: *Deleted

## 2018-03-15 NOTE — Telephone Encounter (Signed)
The patient saw Alonza Bogus PA in the office on 03-05-2018.  The patient did not schedule her colonoscopy because she wanted a  December date.  Georgette Shell called the patient when the December [facility] schedule came out for Dr. Carlean Purl and scheduled the patient for a previsit on 04-15-2018 and the colon is scheduled in the [facility] for 04-22-2018.

## 2018-03-18 NOTE — Progress Notes (Signed)
Cardiology Office Note    Date:  03/19/2018  ID:  Joanna [name], DOB [birth year], MRN [id number] PCP:  [name], MD  Cardiologist:  Candee Furbish, MD  - reviewed with him on pt's first visit last year   Chief Complaint: f/u left arm discomfort, shortness of breath  History of Present Illness:  Joanna [name] is a 67 y.o. 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  She has no prior cardiac history but did have acute right thalamic infarct in 04/2016 with CTA head/neck showing small left vertebral originated directly from arch, with high grade ostial stenosis but likely no significance with regard to right thalamic infarct, no intracranial or extracranial stenosis. She was followed by neurology who felt this was due to small vessel disease. They recommended daily aspirin and follow-up MRI in 1 month given somewhat atypical enhancement pattern. She did not receive IV t-PA due to delay in arrival. 2D echo at that time showed mild LVH, grade 1 DD, normal LVEF, trivial tricuspid regurgitation. She has had intolerances to Crestor, Lipitor, Niacin, Zocor, Pravastatin. She believes that Lipitor contributed to her development of diabetes which was diagnosed about 15 years ago. She also recalls history of bladder bleeding in the past with this, hot flashes and flu-like symptoms. She had nausea/vomiting with Crestor, nausea and vomiting with niacin, myalgias with pravastatin, and GI upset with Zocor and Zetia. She also had an abnormal EKG but was monitored conservatively given lack of anginal type symptoms. PCSK9 has been discussed with the patient who declined. Instead she was agreeable to re-trial with Crestor 10mg  daily with marked improvement in LDL from 192 to 70 in 08/2017, however, she stopped this shortly after due to  anxiety and stomach problems. She even tried to stretch it out or have husband hide in pillbox in case of placebo effect but definitely exacerbated those symptoms. Last labs 08/2017 otherwise showed normal TSH, A1C 6.6, 05/2017 AST 28, ALT 49 (elevated in the past even in absence of statins), 04/2016 CBC wnl.  She returns for evaluation of persistent left arm discomfort which has been present ever since her prior stroke but seemingly getting more frequent/longstanding for days at a time. She states she was seen by neurology who reported this was to be expected but she is perturbed it is more noticeable. No CP, diaphoresis, nausea, syncope, palpitations. She describes this as a general heaviness/numbness in the arm. She exercises regularly and notices it tends to be more prominent with exertion but is able to "work through it." It also occurs for hours on end. She also feels some left leg heaviness with this as well. She has had SOB noticed by family but she herself feels it is stable. She does not check BP at home. She remains resistant to the idea of alternative cholesterol medications and wants to review with endocrinology next month.    Past Medical History:  Diagnosis Date  . Abnormal liver function tests   . Adenomatous colon polyp   . Cerebrovascular disease    a. CT 04/2016 -calcific plaque at the origin LEFT vertebral contributing to severe stenosis.  . Diabetes mellitus    type II--ORAL MEDICATION - NO INSULIN  . Diverticulitis of colon   . Fatty liver    a. mild fatty liver infiltration by CT 2014.  Marland Kitchen Foot  injury 08   Left foot/leg with ?? torn muscle -PROBLEM HAS RESOLVED  . Hand pain 1/09   steroid injections--RESOLVED  . Hx of renal calculi    LEFT  . Hyperlipidemia   . Hypertension   . Hypothyroidism    pt states hx of thyroid nodules  . IBS (irritable bowel syndrome)   . Internal hemorrhoids   . Sleep apnea    USES CPAP - SETTING IS 14  . Statin intolerance   . Stroke  [facility])    a. right thalamic infarct in 04/2016 felt by neuro to be due to small vessel disease.    Past Surgical History:  Procedure Laterality Date  . COLONOSCOPY W/ BIOPSIES AND POLYPECTOMY  05/19/2005   adenomatous polyps  . NEPHROLITHOTOMY Left 01/27/2013   Procedure: NEPHROLITHOTOMY PERCUTANEOUS;  Surgeon: Dutch Gray, MD;  Location: [facility];  Service: Urology;  Laterality: Left;  . TUBAL LIGATION    . WISDOM TEETH EXTRACTED      Current Medications: Current Meds  Medication Sig  . aspirin EC 81 MG tablet Take 1 tablet (81 mg total) by mouth daily.  . cholecalciferol (VITAMIN D) 1000 units tablet Take 2,000 Units by mouth daily.  . Dulaglutide (TRULICITY) 1.5 ZO/1.0RU SOPN INJECT CONTENTS OF PEN WEEKLY  . glimepiride (AMARYL) 1 MG tablet TAKE 1 TABLET (1 MG TOTAL) BY MOUTH AT BEDTIME.  Marland Kitchen glucose blood (ONE TOUCH ULTRA TEST) test strip USE TO CHECK BLOOD SUGAR TWICE DAILY  Dx:E11.65  . hydrocortisone (ANUSOL-HC) 2.5 % rectal cream Use small amount in the perianal area 3 times daily as needed.  Marland Kitchen levothyroxine (SYNTHROID, LEVOTHROID) 100 MCG tablet Take 1 tablet (100 mcg total) by mouth daily.  . Magnesium 250 MG TABS Take 1 tablet by mouth 2 (two) times daily.  . metFORMIN (GLUCOPHAGE) 1000 MG tablet TAKE 1 TABLET (1,000 MG TOTAL) BY MOUTH 2 (TWO) TIMES DAILY.  . ramipril (ALTACE) 2.5 MG capsule TAKE 1 CAPSULE (2.5 MG TOTAL) BY MOUTH DAILY.      Allergies:   Crestor [rosuvastatin]; Lipitor [atorvastatin]; Pravastatin sodium; Sulfonamide derivatives; Zetia [ezetimibe]; Zocor [simvastatin]; and Niacin   Social History   Socioeconomic History  . Marital status: Married    Spouse name: Not on file  . Number of children: 3  . Years of education: Not on file  . Highest education level: Not on file  Occupational History  . Occupation: Producer, television/film/video: [employer]    Comment: sits most of day   Social Needs  . Financial resource strain: Not on file  . Food insecurity:      Worry: Not on file    Inability: Not on file  . Transportation needs:    Medical: Not on file    Non-medical: Not on file  Tobacco Use  . Smoking status: Never Smoker  . Smokeless tobacco: Never Used  Substance and Sexual Activity  . Alcohol use: No  . Drug use: No  . Sexual activity: Not on file  Lifestyle  . Physical activity:    Days per week: Not on file    Minutes per session: Not on file  . Stress: Not on file  Relationships  . Social connections:    Talks on phone: Not on file    Gets together: Not on file    Attends religious service: Not on file    Active member of club or organization: Not on file    Attends meetings of clubs or organizations: Not  on file    Relationship status: Not on file  Other Topics Concern  . Not on file  Social History Narrative   Married, one son to daughters. She does office work. One caffeinated drink daily.   Updated as of 04/30/2013     Family History:  The patient's family history includes Hyperlipidemia in her brother, mother, and sister; Neurofibromatosis in her father; Thyroid disease in her daughter. There is no history of Diabetes, Stroke, Colon cancer, Esophageal cancer, Pancreatic cancer, or Liver cancer.  ROS:   Please see the history of present illness.  All other systems are reviewed and otherwise negative.    PHYSICAL EXAM:   VS:  BP (!) 148/88   Pulse 63   Ht 5' 3.5" (1.613 m)   Wt 173 lb 9.6 oz (78.7 kg)   SpO2 96%   BMI 30.27 kg/m   BMI: Body mass index is 30.27 kg/m. GEN: Well nourished, well developed WF in no acute distress HEENT: normocephalic, atraumatic Neck: no JVD, or masses. + left carotid bruit and L subclavian bruit Cardiac: RRR, 2/6 SEM at RUSB and LSB, higher pitch. No rubs or gallops, no edema  Respiratory:  clear to auscultation bilaterally, normal work of breathing GI: soft, nontender, nondistended, + BS MS: no deformity or atrophy Skin: warm and dry, no rash Neuro:  Alert and Oriented x  3, Strength and sensation are intact, follows commands Psych: euthymic mood, full affect  Wt Readings from Last 3 Encounters:  03/19/18 173 lb 9.6 oz (78.7 kg)  03/05/18 172 lb 12.8 oz (78.4 kg)  09/18/17 167 lb 3.2 oz (75.8 kg)      Studies/Labs Reviewed:   EKG:  EKG was ordered today and personally reviewed by me and demonstrates NSR 63bpm with nonspecific inferior ST changes as well as V5-V6 similar to prior.  Recent Labs: 06/18/2017: ALT 49; BUN 17; Creatinine, Ser 0.71; Potassium 4.8; Sodium 139 09/13/2017: TSH 4.19   Lipid Panel    Component Value Date/Time   CHOL 136 09/13/2017 0817   TRIG 114.0 09/13/2017 0817   HDL 42.80 09/13/2017 0817   CHOLHDL 3 09/13/2017 0817   VLDL 22.8 09/13/2017 0817   LDLCALC 70 09/13/2017 0817   LDLDIRECT 170.0 12/11/2016 0943    Additional studies/ records that were reviewed today include: Summarized above.   ASSESSMENT & PLAN:   1. Left arm discomfort/shortness of breath question atypical angina - symptoms are somewhat atypical although there are some features that are concerning including recent development of increase in left arm heaviness with exertion. I am concerned this could represent an atypical variant of angina, or perhaps upper extremity PAD. Will plan cardiac CTA given symptoms and abnormal EKG (appears stable compared to prior but still with nonspecific TW changes), and carotid duplex to evaluate for subclavian disease. BP in right arm was 168/100 and left arm 171/80. Will also check labs to exclude metabolic contribution to symptoms. 2. Abnormal EKG - as above. 3. HTN - increase ramipril to 5mg  daily today. I told her to check BP daily at home and increase to 10mg  daily after 2-3 days if BP still running >379 systolic. 4. Hyperlipidemia - she does not wish to consider PCSK9 or alternate statin at this time, and instead wants to defer to when she follows up with endocrinology. She would like to have her cholesterol checked today.  We discussed high risk of CV events if this remains uncontrolled. 5. Systolic murmur - rather pronounced, more than documented at last  visit. Update echocardiogram.  Disposition: F/u with myself or Dr. Wilber Oliphant team APP in 2-3 weeks.  Medication Adjustments/Labs and Tests Ordered: Current medicines are reviewed at length with the patient today.  Concerns regarding medicines are outlined above. Medication changes, Labs and Tests ordered today are summarized above and listed in the Patient Instructions accessible in Encounters.   Signed, Charlie Pitter, PA-C  03/19/2018 3:51 PM    [facility] [place], [place], [place]  [address] Phone: [phone]; Fax: [phone]

## 2018-03-19 ENCOUNTER — Ambulatory Visit (INDEPENDENT_AMBULATORY_CARE_PROVIDER_SITE_OTHER): Payer: Medicare Other | Admitting: Physician Assistant

## 2018-03-19 ENCOUNTER — Ambulatory Visit: Payer: Medicare Other | Admitting: Physician Assistant

## 2018-03-19 ENCOUNTER — Encounter: Payer: Self-pay | Admitting: Physician Assistant

## 2018-03-19 VITALS — BP 148/88 | HR 63 | Ht 63.5 in | Wt 173.6 lb

## 2018-03-19 DIAGNOSIS — M79602 Pain in left arm: Secondary | ICD-10-CM | POA: Diagnosis not present

## 2018-03-19 DIAGNOSIS — R011 Cardiac murmur, unspecified: Secondary | ICD-10-CM

## 2018-03-19 DIAGNOSIS — R200 Anesthesia of skin: Secondary | ICD-10-CM | POA: Diagnosis not present

## 2018-03-19 DIAGNOSIS — I10 Essential (primary) hypertension: Secondary | ICD-10-CM | POA: Diagnosis not present

## 2018-03-19 DIAGNOSIS — R0602 Shortness of breath: Secondary | ICD-10-CM

## 2018-03-19 DIAGNOSIS — I208 Other forms of angina pectoris: Secondary | ICD-10-CM

## 2018-03-19 DIAGNOSIS — E782 Mixed hyperlipidemia: Secondary | ICD-10-CM

## 2018-03-19 MED ORDER — RAMIPRIL 5 MG PO CAPS: 5.0000 mg | ORAL_CAPSULE | Freq: Every day | ORAL | 3 refills | Status: DC

## 2018-03-19 MED ORDER — METOPROLOL TARTRATE 100 MG PO TABS: ORAL_TABLET | ORAL | 0 refills | Status: DC

## 2018-03-19 NOTE — Patient Instructions (Addendum)
Medication Instructions:  Your physician has recommended you make the following change in your medication:  1.  INCREASE the Ramipril to 5 mg daily.  Check your blood pressures daily.  If it is still running higher than 130 on the top, increase it to 10 mg daily and call the office to let us know.  If you need a refill on your cardiac medications before your next appointment, please call your pharmacy.   Lab work: TODAY:  CMET, CBC, LIPID, DIRECT LDL, & TSH  If you have labs (blood work) drawn today and your tests are completely normal, you will receive your results only by: Marland Kitchen MyChart Message (if you have MyChart) OR . A paper copy in the mail If you have any lab test that is abnormal or we need to change your treatment, we will call you to review the results.  Testing/Procedures: .Your physician has requested that you have an echocardiogram. Echocardiography is a painless test that uses sound waves to create images of your heart. It provides your doctor with information about the size and shape of your heart and how well your heart's chambers and valves are working. This procedure takes approximately one hour. There are no restrictions for this procedure.  Your physician has requested that you have a carotid duplex. This test is an ultrasound of the carotid arteries in your neck. It looks at blood flow through these arteries that supply the brain with blood. Allow one hour for this exam. There are no restrictions or special instructions.   Your physician has requested that you have cardiac CT. Cardiac computed tomography (CT) is a painless test that uses an x-ray machine to take clear, detailed pictures of your heart. For further information please visit [facility]tn. Please follow instruction sheet below:  Please arrive at the [facility] main entrance of [facility] at xx:xx AM (30-45 minutes prior to test start time)  [facility] [place], [place] [address] [phone]  Proceed to the [facility] Radiology Department (First Floor).  Please follow these instructions carefully (unless otherwise directed):    On the Night Before the Test: . Be sure to Drink plenty of water. . Do not consume any caffeinated/decaffeinated beverages or chocolate 12 hours prior to your test. . Do not take any antihistamines 12 hours prior to your test. . If you take Metformin do not take 24 hours prior to test.  On the Day of the Test: . Drink plenty of water. Do not drink any water within one hour of the test. . Do not eat any food 4 hours prior to the test. . You may take your regular medications prior to the test.  . Take metoprolol (Lopressor) two hours prior to test (this has been sent to your local pharmacy)      After the Test: . Drink plenty of water. . After receiving IV contrast, you may experience a mild flushed feeling. This is normal. . On occasion, you may experience a mild rash up to 24 hours after the test. This is not dangerous. If this occurs, you can take Benadryl 25 mg and increase your fluid intake. . If you experience trouble breathing, this can be serious. If it is severe call 911 IMMEDIATELY. If it is mild, please call our office. . If you take any of these medications: Glipizide/Metformin, Avandament, Glucavance, please do not take 48 hours after completing test.    Follow-Up: Your physician recommends that you schedule a  follow-up appointment in: 2-3 WEEKS WITH DR. Marlou Porch OR HIS CARE TEAM APP  Any Other Special Instructions Will Be Listed Below (If Applicable). Echocardiogram An echocardiogram, or echocardiography, uses sound waves (ultrasound) to produce an image of your heart. The echocardiogram is simple, painless, obtained within a short period of time, and offers valuable information to your health care provider. The images from an echocardiogram can provide information such as:  Evidence of  coronary artery disease (CAD).  Heart size.  Heart muscle function.  Heart valve function.  Aneurysm detection.  Evidence of a past heart attack.  Fluid buildup around the heart.  Heart muscle thickening.  Assess heart valve function.  Tell a health care provider about:  Any allergies you have.  All medicines you are taking, including vitamins, herbs, eye drops, creams, and over-the-counter medicines.  Any problems you or family members have had with anesthetic medicines.  Any blood disorders you have.  Any surgeries you have had.  Any medical conditions you have.  Whether you are pregnant or may be pregnant. What happens before the procedure? No special preparation is needed. Eat and drink normally. What happens during the procedure?  In order to produce an image of your heart, gel will be applied to your chest and a wand-like tool (transducer) will be moved over your chest. The gel will help transmit the sound waves from the transducer. The sound waves will harmlessly bounce off your heart to allow the heart images to be captured in real-time motion. These images will then be recorded.  You may need an IV to receive a medicine that improves the quality of the pictures. What happens after the procedure? You may return to your normal schedule including diet, activities, and medicines, unless your health care provider tells you otherwise. This information is not intended to replace advice given to you by your health care provider. Make sure you discuss any questions you have with your health care provider. Document Released: 05/05/2000 Document Revised: 12/25/2015 Document Reviewed: 01/13/2013 Elsevier Interactive Patient Education  2017 Else[employer] Inc[employer]    Carotid Artery Disease The carotid arteries are arteries on both sides of the neck. They carry blood to the brain. Carotid artery disease is when the arteries get smaller (narrow) or get blocked. If these arteries get  smaller or get blocked, you are more likely to have a stroke or warning stroke (transient ischemic attack). Follow these instructions at home:  Take medicines as told by your doctor. Make sure you understand all your medicine instructions. Do not stop your medicines without talking to your doctor first.  Follow your doctor's diet instructions. It is important to eat a healthy diet that includes plenty of: ? Fresh fruits. ? Vegetables. ? Lean meats.  Avoid: ? High-fat foods. ? High-sodium foods. ? Foods that are fried, overly processed, or have poor nutritional value.  Stay a healthy weight.  Stay active. Get at least 30 minutes of activity every day.  Do not smoke.  Limit alcohol use to: ? No more than 2 drinks a day for men. ? No more than 1 drink a day for women who are not pregnant.  Do not use illegal drugs.  Keep all doctor visits as told. Get help right away if:  You have sudden weakness or loss of feeling (numbness) on one side of the body, such as the face, arm, or leg.  You have sudden confusion.  You have trouble speaking (aphasia) or understanding.  You have sudden trouble seeing  out of one or both eyes.  You have sudden trouble walking.  You have dizziness or feel like you might pass out (faint).  You have a loss of balance or your movements are not steady (uncoordinated).  You have a sudden, severe headache with no known cause.  You have trouble swallowing (dysphagia). Call your local emergency services (911 in [place] Do notdrive yourself to the clinic or hospital. This information is not intended to replace advice given to you by your health care provider. Make sure you discuss any questions you have with your health care provider. Document Released: 04/24/2012 Document Revised: 10/14/2015 Document Reviewed: 11/06/2012 Elsevier Interactive Patient Education  2018 [employer].    Cardiac CT Angiogram A cardiac CT angiogram is a procedure to look  at the heart and the area around the heart. It may be done to help find the cause of chest pains or other symptoms of heart disease. During this procedure, a large X-ray machine, called a CT scanner, takes detailed pictures of the heart and the surrounding area after a dye (contrast material) has been injected into blood vessels in the area. The procedure is also sometimes called a coronary CT angiogram, coronary artery scanning, or CTA. A cardiac CT angiogram allows the health care provider to see how well blood is flowing to and from the heart. The health care provider will be able to see if there are any problems, such as:  Blockage or narrowing of the coronary arteries in the heart.  Fluid around the heart.  Signs of weakness or disease in the muscles, valves, and tissues of the heart.  Tell a health care provider about:  Any allergies you have. This is especially important if you have had a previous allergic reaction to contrast dye.  All medicines you are taking, including vitamins, herbs, eye drops, creams, and over-the-counter medicines.  Any blood disorders you have.  Any surgeries you have had.  Any medical conditions you have.  Whether you are pregnant or may be pregnant.  Any anxiety disorders, chronic pain, or other conditions you have that may increase your stress or prevent you from lying still. What are the risks? Generally, this is a safe procedure. However, problems may occur, including:  Bleeding.  Infection.  Allergic reactions to medicines or dyes.  Damage to other structures or organs.  Kidney damage from the dye or contrast that is used.  Increased risk of cancer from radiation exposure. This risk is low. Talk with your health care provider about: ? The risks and benefits of testing. ? How you can receive the lowest dose of radiation.  What happens before the procedure?  Wear comfortable clothing and remove any jewelry, glasses, dentures, and hearing  aids.  Follow instructions from your health care provider about eating and drinking. This may include: ? For 12 hours before the test - avoid caffeine. This includes tea, coffee, soda, energy drinks, and diet pills. Drink plenty of water or other fluids that do not have caffeine in them. Being well-hydrated can prevent complications. ? For 4-6 hours before the test - stop eating and drinking. The contrast dye can cause nausea, but this is less likely if your stomach is empty.  Ask your health care provider about changing or stopping your regular medicines. This is especially important if you are taking diabetes medicines, blood thinners, or medicines to treat erectile dysfunction. What happens during the procedure?  Hair on your chest may need to be removed so that small  sticky patches called electrodes can be placed on your chest. These will transmit information that helps to monitor your heart during the test.  An IV tube will be inserted into one of your veins.  You might be given a medicine to control your heart rate during the test. This will help to ensure that good images are obtained.  You will be asked to lie on an exam table. This table will slide in and out of the CT machine during the procedure.  Contrast dye will be injected into the IV tube. You might feel warm, or you may get a metallic taste in your mouth.  You will be given a medicine (nitroglycerin) to relax (dilate) the arteries in your heart.  The table that you are lying on will move into the CT machine tunnel for the scan.  The person running the machine will give you instructions while the scans are being done. You may be asked to: ? Keep your arms above your head. ? Hold your breath. ? Stay very still, even if the table is moving.  When the scanning is complete, you will be moved out of the machine.  The IV tube will be removed. The procedure may vary among health care providers and hospitals. What happens  after the procedure?  You might feel warm, or you may get a metallic taste in your mouth from the contrast dye.  You may have a headache from the nitroglycerin.  After the procedure, drink water or other fluids to wash (flush) the contrast material out of your body.  Contact a health care provider if you have any symptoms of allergy to the contrast. These symptoms include: ? Shortness of breath. ? Rash or hives. ? A racing heartbeat.  Most people can return to their normal activities right after the procedure. Ask your health care provider what activities are safe for you.  It is up to you to get the results of your procedure. Ask your health care provider, or the department that is doing the procedure, when your results will be ready. Summary  A cardiac CT angiogram is a procedure to look at the heart and the area around the heart. It may be done to help find the cause of chest pains or other symptoms of heart disease.  During this procedure, a large X-ray machine, called a CT scanner, takes detailed pictures of the heart and the surrounding area after a dye (contrast material) has been injected into blood vessels in the area.  Ask your health care provider about changing or stopping your regular medicines before the procedure. This is especially important if you are taking diabetes medicines, blood thinners, or medicines to treat erectile dysfunction.  After the procedure, drink water or other fluids to wash (flush) the contrast material out of your body. This information is not intended to replace advice given to you by your health care provider. Make sure you discuss any questions you have with your health care provider. Document Released: 04/20/2008 Document Revised: 03/27/2016 Document Reviewed: 03/27/2016 Elsevier Interactive Patient Education  2017 [employer].

## 2018-03-20 ENCOUNTER — Other Ambulatory Visit: Payer: Self-pay | Admitting: Physician Assistant

## 2018-03-20 ENCOUNTER — Telehealth: Payer: Self-pay | Admitting: *Deleted

## 2018-03-20 DIAGNOSIS — Z85820 Personal history of malignant melanoma of skin: Secondary | ICD-10-CM | POA: Diagnosis not present

## 2018-03-20 DIAGNOSIS — D2271 Melanocytic nevi of right lower limb, including hip: Secondary | ICD-10-CM | POA: Diagnosis not present

## 2018-03-20 DIAGNOSIS — L720 Epidermal cyst: Secondary | ICD-10-CM | POA: Diagnosis not present

## 2018-03-20 DIAGNOSIS — L821 Other seborrheic keratosis: Secondary | ICD-10-CM | POA: Diagnosis not present

## 2018-03-20 DIAGNOSIS — D225 Melanocytic nevi of trunk: Secondary | ICD-10-CM | POA: Diagnosis not present

## 2018-03-20 DIAGNOSIS — D2372 Other benign neoplasm of skin of left lower limb, including hip: Secondary | ICD-10-CM | POA: Diagnosis not present

## 2018-03-20 DIAGNOSIS — D2362 Other benign neoplasm of skin of left upper limb, including shoulder: Secondary | ICD-10-CM | POA: Diagnosis not present

## 2018-03-20 DIAGNOSIS — D2239 Melanocytic nevi of other parts of face: Secondary | ICD-10-CM | POA: Diagnosis not present

## 2018-03-20 DIAGNOSIS — L814 Other melanin hyperpigmentation: Secondary | ICD-10-CM | POA: Diagnosis not present

## 2018-03-20 LAB — COMPREHENSIVE METABOLIC PANEL
ALK PHOS: 66 IU/L (ref 39–117)
ALT: 72 IU/L — AB (ref 0–32)
AST: 41 IU/L — AB (ref 0–40)
Albumin/Globulin Ratio: 2.2 (ref 1.2–2.2)
Albumin: 4.8 g/dL (ref 3.6–4.8)
BUN/Creatinine Ratio: 15 (ref 12–28)
BUN: 9 mg/dL (ref 8–27)
Bilirubin Total: 0.4 mg/dL (ref 0.0–1.2)
CO2: 24 mmol/L (ref 20–29)
CREATININE: 0.6 mg/dL (ref 0.57–1.00)
Calcium: 10 mg/dL (ref 8.7–10.3)
Chloride: 100 mmol/L (ref 96–106)
GFR calc Af Amer: 111 mL/min/{1.73_m2} (ref >59)
GFR, EST NON AFRICAN AMERICAN: 96 mL/min/{1.73_m2} (ref >59)
Globulin, Total: 2.2 g/dL (ref 1.5–4.5)
Glucose: 89 mg/dL (ref 65–99)
POTASSIUM: 4.4 mmol/L (ref 3.5–5.2)
Sodium: 141 mmol/L (ref 134–144)
Total Protein: 7 g/dL (ref 6.0–8.5)

## 2018-03-20 LAB — TSH: TSH: 5.39 u[IU]/mL — AB (ref 0.450–4.500)

## 2018-03-20 LAB — CBC
HEMOGLOBIN: 14.4 g/dL (ref 11.1–15.9)
Hematocrit: 41.5 % (ref 34.0–46.6)
MCH: 30.3 pg (ref 26.6–33.0)
MCHC: 34.7 g/dL (ref 31.5–35.7)
MCV: 87 fL (ref 79–97)
PLATELETS: 278 10*3/uL (ref 150–450)
RBC: 4.75 x10E6/uL (ref 3.77–5.28)
RDW: 13.3 % (ref 12.3–15.4)
WBC: 7.1 10*3/uL (ref 3.4–10.8)

## 2018-03-20 LAB — LIPID PANEL
CHOL/HDL RATIO: 6.4 ratio — AB (ref 0.0–4.4)
Cholesterol, Total: 276 mg/dL — ABNORMAL HIGH (ref 100–199)
HDL: 43 mg/dL (ref >39)
LDL Calculated: 190 mg/dL — ABNORMAL HIGH (ref 0–99)
Triglycerides: 217 mg/dL — ABNORMAL HIGH (ref 0–149)
VLDL Cholesterol Cal: 43 mg/dL — ABNORMAL HIGH (ref 5–40)

## 2018-03-20 LAB — LDL CHOLESTEROL, DIRECT: LDL DIRECT: 209 mg/dL — AB (ref 0–99)

## 2018-03-20 NOTE — Telephone Encounter (Signed)
-----   Message from Charlie Pitter, [place] sent at 03/20/2018  9:03 AM EDT ----- [name], please call patient. Labs are OK except a few abnormalities - LFTs persistently elevated similar to prior -> discuss with PCP (not related to statin since she stopped in April, was also present before statin) - TSH is elevated so she should review with her endocrinologist - will forward value to Dr. 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 As we talked about in her visit I remain very concerned about her impending risk of recurrent stroke or possibility of heart attack/vascular disease or shortening lifespan. Continue plan as we discussed in clinic today for testing, but needs to really give the PCSK9 injections some serious thought since she has continued to fail statins on a regular basis. LDL was great in 08/2017 but this was before she stopped Crestor once more because she could not tolerate it. We have several patients doing fabulous on the PCSK9 medications which are in general tolerated much better than statins in this patient population.  Dayna Dunn PA-C

## 2018-03-20 NOTE — Telephone Encounter (Signed)
Called pt re: lab results.  Left a message for her to call back.  

## 2018-03-21 NOTE — Telephone Encounter (Signed)
Returned pts call re: lab results. Left another message for her to call back.

## 2018-03-21 NOTE — Telephone Encounter (Signed)
Follow up:   Patient returning call about result.

## 2018-03-22 ENCOUNTER — Ambulatory Visit (HOSPITAL_COMMUNITY)
Admission: RE | Admit: 2018-03-22 | Discharge: 2018-03-22 | Disposition: A | Discharge status: Home / Self Care | Payer: Medicare Other | Source: Ambulatory Visit | Attending: Cardiovascular Disease | Admitting: Cardiovascular Disease

## 2018-03-22 ENCOUNTER — Other Ambulatory Visit: Payer: Self-pay | Admitting: Physician Assistant

## 2018-03-22 DIAGNOSIS — E782 Mixed hyperlipidemia: Secondary | ICD-10-CM

## 2018-03-22 DIAGNOSIS — I10 Essential (primary) hypertension: Secondary | ICD-10-CM

## 2018-03-22 DIAGNOSIS — I208 Other forms of angina pectoris: Secondary | ICD-10-CM | POA: Diagnosis not present

## 2018-03-22 DIAGNOSIS — R011 Cardiac murmur, unspecified: Secondary | ICD-10-CM

## 2018-03-22 DIAGNOSIS — R0602 Shortness of breath: Secondary | ICD-10-CM

## 2018-03-22 DIAGNOSIS — R0989 Other specified symptoms and signs involving the circulatory and respiratory systems: Secondary | ICD-10-CM | POA: Diagnosis not present

## 2018-03-22 DIAGNOSIS — R200 Anesthesia of skin: Secondary | ICD-10-CM

## 2018-03-22 DIAGNOSIS — I2089 Other forms of angina pectoris: Secondary | ICD-10-CM

## 2018-03-25 ENCOUNTER — Other Ambulatory Visit: Payer: Self-pay

## 2018-03-25 ENCOUNTER — Ambulatory Visit (HOSPITAL_COMMUNITY): Payer: Medicare Other | Attending: Physician Assistant

## 2018-03-25 DIAGNOSIS — I10 Essential (primary) hypertension: Secondary | ICD-10-CM

## 2018-03-25 DIAGNOSIS — R0602 Shortness of breath: Secondary | ICD-10-CM | POA: Diagnosis not present

## 2018-03-25 DIAGNOSIS — I208 Other forms of angina pectoris: Secondary | ICD-10-CM | POA: Diagnosis not present

## 2018-03-25 DIAGNOSIS — R200 Anesthesia of skin: Secondary | ICD-10-CM

## 2018-03-25 DIAGNOSIS — E782 Mixed hyperlipidemia: Secondary | ICD-10-CM

## 2018-03-25 DIAGNOSIS — I2089 Other forms of angina pectoris: Secondary | ICD-10-CM

## 2018-03-25 DIAGNOSIS — R011 Cardiac murmur, unspecified: Secondary | ICD-10-CM | POA: Diagnosis not present

## 2018-03-25 DIAGNOSIS — M79602 Pain in left arm: Secondary | ICD-10-CM

## 2018-03-25 NOTE — Telephone Encounter (Signed)
Left another message for pt to call back.

## 2018-03-25 NOTE — Telephone Encounter (Signed)
Follow up  ° ° °Patient is returning call in reference to lab results. Please call  °

## 2018-03-26 NOTE — Telephone Encounter (Signed)
Returned pts call.  Left a detailed message for pt to have me paged when she calls back.

## 2018-03-26 NOTE — Telephone Encounter (Signed)
Joanna [name], it looks like you are already on board looking into the CT. Thanks! At last OV we increased ramipril from 2.5->5mg . She was also given instructions that "If it is still running higher than 130 on the top, increase it to 10 mg daily and call the office to let us know." If she has not increased the medicine I would advise she go ahead and do so. If pain recurs or develops any similar symptoms associated with SOB, nausea, vomiting, or sweating, would go to ER to expedite workup and check labs/EKG during episode of pain to make sure no acute problem. But if feeling better now, continue OP workup as planned.   PA-C

## 2018-03-26 NOTE — Telephone Encounter (Signed)
Follow Up:; ° ° °Returning your call. °

## 2018-04-09 ENCOUNTER — Telehealth: Payer: Self-pay | Admitting: Internal Medicine

## 2018-04-09 MED ORDER — RAMIPRIL 10 MG PO CAPS: 10.0000 mg | ORAL_CAPSULE | Freq: Every day | ORAL | 3 refills | Status: DC

## 2018-04-09 MED ORDER — AMLODIPINE BESYLATE 5 MG PO TABS: 5.0000 mg | ORAL_TABLET | Freq: Every day | ORAL | 3 refills | Status: DC

## 2018-04-09 NOTE — Telephone Encounter (Signed)
Pt advised Joanna [name]'s recommendations.. She has been taking the ramipril 10mg  a day.Marland Kitchen She will add the amlodipine 5mg  and will keep track of her BP the next several days and will call us after recording her BP readings so we can see how she has been doing.Marland Kitchen She will call sooner if they are consistently staying above 130. Pt verbalized understanding and agrees.

## 2018-04-09 NOTE — Telephone Encounter (Signed)
Thanks for calling pt back. When she calls back , please make sure to reference Mychart message from 11/5 where she was instructed to increase ramipril to 10mg  daily if still running high. If this has proven ineffective at making a dent in BP, would suggest we then add amlodipine 5mg  daily and continue to follow BP with same parameters as before (call if still running >564 systolic). Dayna Dunn PA-C

## 2018-04-09 NOTE — Telephone Encounter (Signed)
The pt has had a cardiology appt since scheduling colon, she has HTN problems that are not controlled on 2 BP meds.  She has further testing coming up and wanted to know if she should cancel colon.  The colon is for recall and she is not having any problems at this time.  The procedure was cancelled and she will call back after her cardiac workup.

## 2018-04-10 ENCOUNTER — Telehealth: Payer: Self-pay | Admitting: *Deleted

## 2018-04-10 ENCOUNTER — Telehealth: Payer: Self-pay

## 2018-04-10 NOTE — Telephone Encounter (Signed)
Please call patient to explain TSH was elevated, indicating thyroid might be underactive. This does not typically drive up blood pressure. Please forward recent thyroid level as requested. Dayna Dunn PA-C

## 2018-04-10 NOTE — Telephone Encounter (Signed)
lpmtcb 11/20

## 2018-04-10 NOTE — Telephone Encounter (Signed)
More Detail >>  Non-Urgent Medical Question  Felipa Evener  Sent: Wed April 10, 2018 1:13 PM  To: [facility] Triage      Message   ----- Message from Charlie Pitter, [place] sent at 04/10/2018 1:13 PM EST -----      ----- Message from [name] to Charlie Pitter, PA-C sent at 04/10/2018 12:45 PM -----   I talked to [place] about my test results and she said my thyroid levels were to high. Could that make my blood pressure high. Send the test to Dr Dwyane Dee at [facility] [phone] his my Endocrinology dr.  [name]      Joanna [name]  04/10/2018  Patient Message  MRN:  [id number]  Description: 67 year old female Provider: Mychart, Generic Provider Department: [facility] Office     Call Documentation   Charlie Pitter, PA-C at 04/10/2018 1:12 PM   Status: Signed    Please call patient to explain TSH was elevated, indicating thyroid might be underactive. This does not typically drive up blood pressure. Please forward recent thyroid level as requested. Melina Copa PA-C     Encounter MyChart Messages   Read Composed From To Subject  Y 04/10/2018 12:45 PM Paulla Fore, PA-C Non-Urgent Medical Question  Routing History   Priority Sent On From To Message Type   04/10/2018 1:13 PM Dunn, Nedra Hai, PA-C P [facility] Triage Pt Advice Request   04/10/2018 12:49 PM Frederik Schmidt, RN Charlie Pitter, PA-C Pt Advice Request   04/10/2018 12:45 PM Mychart, Generic [facility] Triage Pt Advice Request  Created by   Mychart, Generic on 04/10/2018 12:45 PM  Visit Pharmacy   [id number] - [place], [place]

## 2018-04-10 NOTE — Telephone Encounter (Signed)
LM TO CALL BACK ./CY 

## 2018-04-11 ENCOUNTER — Other Ambulatory Visit (INDEPENDENT_AMBULATORY_CARE_PROVIDER_SITE_OTHER): Payer: Medicare Other

## 2018-04-11 DIAGNOSIS — E1165 Type 2 diabetes mellitus with hyperglycemia: Secondary | ICD-10-CM

## 2018-04-11 LAB — COMPREHENSIVE METABOLIC PANEL
ALK PHOS: 54 U/L (ref 39–117)
ALT: 75 U/L — ABNORMAL HIGH (ref 0–35)
AST: 46 U/L — AB (ref 0–37)
Albumin: 4.8 g/dL (ref 3.5–5.2)
BILIRUBIN TOTAL: 0.5 mg/dL (ref 0.2–1.2)
BUN: 17 mg/dL (ref 6–23)
CO2: 28 mEq/L (ref 19–32)
CREATININE: 0.77 mg/dL (ref 0.40–1.20)
Calcium: 10.1 mg/dL (ref 8.4–10.5)
Chloride: 102 mEq/L (ref 96–112)
GFR: 79.82 mL/min (ref >60.00)
GLUCOSE: 102 mg/dL — AB (ref 70–99)
Potassium: 5.1 mEq/L (ref 3.5–5.1)
Sodium: 140 mEq/L (ref 135–145)
TOTAL PROTEIN: 7.6 g/dL (ref 6.0–8.3)

## 2018-04-11 LAB — TSH: TSH: 4.87 u[IU]/mL — AB (ref 0.35–4.50)

## 2018-04-11 LAB — MICROALBUMIN / CREATININE URINE RATIO
CREATININE, U: 136.8 mg/dL
MICROALB/CREAT RATIO: 3.9 mg/g (ref 0.0–30.0)
Microalb, Ur: 5.3 mg/dL — ABNORMAL HIGH (ref 0.0–1.9)

## 2018-04-11 LAB — HEMOGLOBIN A1C: Hgb A1c MFr Bld: 6.2 % (ref 4.6–6.5)

## 2018-04-11 LAB — T4, FREE: FREE T4: 0.82 ng/dL (ref 0.60–1.60)

## 2018-04-12 NOTE — Telephone Encounter (Signed)
Left message for patient to call back. Will discuss Dayna's message below when she calls back.  "Please call patient to explain TSH was elevated, indicating thyroid might be underactive. This does not typically drive up blood pressure. Please forward recent thyroid level as requested". Dayna Dunn PA-C

## 2018-04-15 ENCOUNTER — Encounter: Payer: Self-pay | Admitting: Cardiology

## 2018-04-16 ENCOUNTER — Ambulatory Visit (INDEPENDENT_AMBULATORY_CARE_PROVIDER_SITE_OTHER): Payer: Medicare Other | Admitting: Endocrinology

## 2018-04-16 ENCOUNTER — Encounter: Payer: Self-pay | Admitting: Endocrinology

## 2018-04-16 VITALS — BP 150/90 | HR 72 | Ht 63.5 in | Wt 166.0 lb

## 2018-04-16 DIAGNOSIS — E063 Autoimmune thyroiditis: Secondary | ICD-10-CM | POA: Diagnosis not present

## 2018-04-16 DIAGNOSIS — I208 Other forms of angina pectoris: Secondary | ICD-10-CM | POA: Diagnosis not present

## 2018-04-16 DIAGNOSIS — I10 Essential (primary) hypertension: Secondary | ICD-10-CM | POA: Diagnosis not present

## 2018-04-16 DIAGNOSIS — E782 Mixed hyperlipidemia: Secondary | ICD-10-CM

## 2018-04-16 DIAGNOSIS — R252 Cramp and spasm: Secondary | ICD-10-CM | POA: Diagnosis not present

## 2018-04-16 DIAGNOSIS — E119 Type 2 diabetes mellitus without complications: Secondary | ICD-10-CM | POA: Diagnosis not present

## 2018-04-16 MED ORDER — LEVOTHYROXINE SODIUM 112 MCG PO TABS: 112.0000 ug | ORAL_TABLET | Freq: Every day | ORAL | 0 refills | Status: DC

## 2018-04-16 NOTE — Progress Notes (Addendum)
Patient ID: Joanna [name], female   DOB: 1950-11-24, 67 y.o.   MRN: [id number]           Reason for Appointment: Follow-up of diabetes  Referring physician: Deborra Medina  History of Present Illness:          Diagnosis: Type 2 diabetes mellitus, date of diagnosis: 2010?       Prior history:  On her initial consultation she was advised to start Tanzeum in addition to metformin for multiple benefits including long-term control; was started in 12/15.  Recent history:  A1c is 6.2 compared to 6.6 previously        Non-insulin hypoglycemic drugs the patient is taking are: Metformin 1 g 2x  a day, Trulicity 1.5 mg weekly, Amaryl 1 mg at bedtime.     Glucose patterns and problems identified:  She was recommended adding Amaryl to her evening regimen because of high fasting readings April but she is only now coming back for follow-up; she refuses to come for follow-up more than every 6 months  She did not bring her monitor today  Although she was told to try only half a tablet of Amaryl she thinks she went up to 1 mg recently since she thinks she was still having higher morning readings  More recently her morning sugars have been just over 100 usually including 102 in the lab  She is not feeling hypoglycemic during the day although blood sugars may be as low as 80  Also blood sugars are not apparently higher after evening meal and mostly 90-100 by recall  She has at least for the last few weeks cut back carbohydrates like bread and is starting to lose weight  However for various reasons has not exercised  She is complaining about the cost of Trulicity and now being in the donut hole    Side effects from medications have been: none Compliance with the medical regimen: fair    Glucose monitoring:  done once or twice a day         Glucometer: One Touch ultra mini,    Blood Glucose readings  from recall  Am 110-130 Before meals 80  Bedtime 100  Average blood sugar on her previous visit was  171  Self-care: The diet that the patient has been following is: tries to limit fat intake.      Meals: 3 meals per day. Breakfast is eggs, , sometimes cottage cheese   Lunch is a sandwich or soup.  Snacks: Cottage cheese and fruit, no sweet drinks juices          Exercise: none now      Dietician visit, most recent: never     CDE visit: 1/16            Weight history:    Wt Readings from Last 3 Encounters:  04/16/18 166 lb (75.3 kg)  03/19/18 173 lb 9.6 oz (78.7 kg)  03/05/18 172 lb 12.8 oz (78.4 kg)    Glycemic control:   Lab Results  Component Value Date   HGBA1C 6.2 04/11/2018   HGBA1C 6.6 (H) 09/13/2017   HGBA1C 6.6 (H) 06/18/2017   Lab Results  Component Value Date   MICROALBUR 5.3 (H) 04/11/2018   LDLCALC 190 (H) 03/19/2018   CREATININE 0.77 04/11/2018   Lab Results  Component Value Date   FRUCTOSAMINE 247 06/02/2016   FRUCTOSAMINE 274 (H) 07/16/2014    Past diabetes history:  For several years prior to her diagnosis she had been  complaining of her feet burning Her blood sugars were mildly increased initially at diagnosis and she did try to control it with diet alone and exercise without getting them back to normal.  Her A1c had continue to be around 7% with the lowest one 6.7  About 2012 she was started on metformin probably when her A1c was 7.3.   In 2015 her blood sugars had been higher with A1c 8.1%  HYPERLIPIDEMIA: Discussed in review of systems    Allergies as of 04/16/2018      Reactions   Crestor [rosuvastatin]    REACTION: N/V and heartburn   Lipitor [atorvastatin]    REACTION: Hot flashes and flu-like symptoms   Pravastatin Sodium    REACTION: Neck swelling and pain in her shoulders and arms.   Sulfonamide Derivatives    Rxn Unknown   Zetia [ezetimibe]    GI upset   Zocor [simvastatin] Other (See Comments)   REACTION: GI.Marland Kitchen Pt unknown of severity   Niacin    REACTION: n/v      Medication List        Accurate as of 04/16/18  3:51  PM. Always use your most recent med list.          amLODipine 5 MG tablet Commonly known as:  NORVASC Take 1 tablet (5 mg total) by mouth daily.   aspirin EC 81 MG tablet Take 1 tablet (81 mg total) by mouth daily.   cholecalciferol 1000 units tablet Commonly known as:  VITAMIN D Take 2,000 Units by mouth daily.   Dulaglutide 1.5 MG/0.5ML Sopn INJECT CONTENTS OF PEN WEEKLY   glimepiride 1 MG tablet Commonly known as:  AMARYL TAKE 1 TABLET (1 MG TOTAL) BY MOUTH AT BEDTIME.   glucose blood test strip USE TO CHECK BLOOD SUGAR TWICE DAILY  Dx:E11.65   hydrocortisone 2.5 % rectal cream Commonly known as:  ANUSOL-HC Use small amount in the perianal area 3 times daily as needed.   levothyroxine 100 MCG tablet Commonly known as:  SYNTHROID, LEVOTHROID Take 1 tablet (100 mcg total) by mouth daily.   Magnesium 250 MG Tabs Take 1 tablet by mouth 2 (two) times daily.   metFORMIN 1000 MG tablet Commonly known as:  GLUCOPHAGE TAKE 1 TABLET (1,000 MG TOTAL) BY MOUTH 2 (TWO) TIMES DAILY.   metoprolol tartrate 100 MG tablet Commonly known as:  LOPRESSOR Take 1 tablet 2 hours before your ct scan   ramipril 10 MG capsule Commonly known as:  ALTACE Take 1 capsule (10 mg total) by mouth daily.   rosuvastatin 5 MG tablet Commonly known as:  CRESTOR Take 2.5 mg by mouth daily.       Allergies:  Allergies  Allergen Reactions  . Crestor [Rosuvastatin]     REACTION: N/V and heartburn  . Lipitor [Atorvastatin]     REACTION: Hot flashes and flu-like symptoms  . Pravastatin Sodium     REACTION: Neck swelling and pain in her shoulders and arms.  . Sulfonamide Derivatives     Rxn Unknown  . Zetia [Ezetimibe]     GI upset  . Zocor [Simvastatin] Other (See Comments)    REACTION: GI.Marland Kitchen Pt unknown of severity  . Niacin     REACTION: n/v    Past Medical History:  Diagnosis Date  . Abnormal liver function tests   . Adenomatous colon polyp   . Cerebrovascular disease    a.  CT 04/2016 -calcific plaque at the origin LEFT vertebral contributing to severe stenosis.  Marland Kitchen  Diabetes mellitus    type II--ORAL MEDICATION - NO INSULIN  . Diverticulitis of colon   . Fatty liver    a. mild fatty liver infiltration by CT 2014.  Marland Kitchen Foot injury 08   Left foot/leg with ?? torn muscle -PROBLEM HAS RESOLVED  . Hand pain 1/09   steroid injections--RESOLVED  . Hx of renal calculi    LEFT  . Hyperlipidemia   . Hypertension   . Hypothyroidism    pt states hx of thyroid nodules  . IBS (irritable bowel syndrome)   . Internal hemorrhoids   . Sleep apnea    USES CPAP - SETTING IS 14  . Statin intolerance   . Stroke [facility])    a. right thalamic infarct in 04/2016 felt by neuro to be due to small vessel disease.    Past Surgical History:  Procedure Laterality Date  . COLONOSCOPY W/ BIOPSIES AND POLYPECTOMY  05/19/2005   adenomatous polyps  . NEPHROLITHOTOMY Left 01/27/2013   Procedure: NEPHROLITHOTOMY PERCUTANEOUS;  Surgeon: Dutch Gray, MD;  Location: [facility] ORS;  Service: Urology;  Laterality: Left;  . TUBAL LIGATION    . WISDOM TEETH EXTRACTED      Family History  Problem Relation Age of Onset  . Hyperlipidemia Mother   . Neurofibromatosis Father   . Thyroid disease Daughter   . Hyperlipidemia Sister   . Hyperlipidemia Brother   . Diabetes Neg Hx   . Stroke Neg Hx   . Colon cancer Neg Hx   . Esophageal cancer Neg Hx   . Pancreatic cancer Neg Hx   . Liver cancer Neg Hx     Social History:  reports that she has never smoked. She has never used smokeless tobacco. She reports that she does not drink alcohol or use drugs.     Review of Systems        LIPIDS: she has long-standing severe hypercholesterolemia and has been intolerant to all statin drugs with various reactions Lipitor caused flulike symptoms, abdominal bloating and hot flashes, pravastatin caused neck swelling and upper body pain, Zocor caused GI upset.  Was not able to tolerate Zetia as she thinks it upset  his stomach She did not want to continue Repatha because of cost  LDL particle number previously over 2500 with ideal levels of below 1000  Currently taking 2.5 mg Crestor every other day and followed by cardiology PA Although previously had taken a larger dose and LDL was down to 70 recently with lower doses her LDL is over 200 again  Also taking small doses of OTC fish oil      Lab Results  Component Value Date   CHOL 276 (H) 03/19/2018   HDL 43 03/19/2018   LDLCALC 190 (H) 03/19/2018   LDLDIRECT 209 (H) 03/19/2018   TRIG 217 (H) 03/19/2018   CHOLHDL 6.4 (H) 03/19/2018               Thyroid:    She has had hypothyroidism for about 10 years and initially had a goiter also; highest TSH probably about 9.2 Currently on 100 g levothyroxine since 10/18 She takes this regularly No consistent symptoms of fatigue recently  Her TSH appears to be higher than normal  Last TSH:    Lab Results  Component Value Date   TSH 4.87 (H) 04/11/2018   TSH 5.390 (H) 03/19/2018   TSH 4.19 09/13/2017   FREET4 0.82 04/11/2018   FREET4 1.03 02/12/2014   FREET4 1.04 12/09/2013  The blood pressure is being treated with 5 mg amlodipine and 10 mg ramipril along with metoprolol This is also followed by cardiologist Blood pressure appears to be higher recently although appears to be asymmetrical with high reading on the right side   BP Readings from Last 3 Encounters:  04/16/18 (!) 150/90  03/19/18 (!) 148/88  03/05/18 120/84         She is asking about other problems including periodic numbness of her left arm which she has previously discussed with neurologist Also asking about cramps in different muscles despite taking magnesium supplements   LABS:  Lab on 04/11/2018  Component Date Value Ref Range Status  . Microalb, Ur 04/11/2018 5.3* 0.0 - 1.9 mg/dL Final  . Creatinine,U 04/11/2018 136.8  mg/dL Final  . Microalb Creat Ratio 04/11/2018 3.9  0.0 - 30.0 mg/g Final  . Free T4  04/11/2018 0.82  0.60 - 1.60 ng/dL Final   Comment: Specimens from patients who are undergoing biotin therapy and /or ingesting biotin supplements may contain high levels of biotin.  The higher biotin concentration in these specimens interferes with this Free T4 assay.  Specimens that contain high levels  of biotin may cause false high results for this Free T4 assay.  Please interpret results in light of the total clinical presentation of the patient.    Marland Kitchen TSH 04/11/2018 4.87* 0.35 - 4.50 uIU/mL Final  . Sodium 04/11/2018 140  135 - 145 mEq/L Final  . Potassium 04/11/2018 5.1  3.5 - 5.1 mEq/L Final  . Chloride 04/11/2018 102  96 - 112 mEq/L Final  . CO2 04/11/2018 28  19 - 32 mEq/L Final  . Glucose, Bld 04/11/2018 102* 70 - 99 mg/dL Final  . BUN 04/11/2018 17  6 - 23 mg/dL Final  . Creatinine, Ser 04/11/2018 0.77  0.40 - 1.20 mg/dL Final  . Total Bilirubin 04/11/2018 0.5  0.2 - 1.2 mg/dL Final  . Alkaline Phosphatase 04/11/2018 54  39 - 117 U/L Final  . AST 04/11/2018 46* 0 - 37 U/L Final  . ALT 04/11/2018 75* 0 - 35 U/L Final  . Total Protein 04/11/2018 7.6  6.0 - 8.3 g/dL Final  . Albumin 04/11/2018 4.8  3.5 - 5.2 g/dL Final  . Calcium 04/11/2018 10.1  8.4 - 10.5 mg/dL Final  . GFR 04/11/2018 79.82  >60.00 mL/min Final  . Hgb A1c MFr Bld 04/11/2018 6.2  4.6 - 6.5 % Final   Glycemic Control Guidelines for People with Diabetes:Non Diabetic:  <6%Goal of Therapy: <7%Additional Action Suggested:  >8%     Physical Examination:  BP (!) 150/90 (BP Location: Right Arm, Patient Position: Sitting, Cuff Size: Normal)   Pulse 72   Ht 5' 3.5" (1.613 m)   Wt 166 lb (75.3 kg)   SpO2 96%   BMI 28.94 kg/m       No pedal edema present     ASSESSMENT/PLAN:   Diabetes type 2 with BMI 29  See history of present illness for details of her current blood sugar patterns and management  A1c is improved to 6.2 both by adding Amaryl and improving her diet with reduced carbohydrate intake With this  her blood sugars are near normal although she did not bring the readings for review from her home meter She does need to exercise which she is not doing currently Although she does not want to take Trulicity discussed that this has helped her significantly previously   Recommendations:  She will continue but take  only half of Amaryl 1 mg at bedtime  Discussed that this will reduce potential for hypoglycemia especially with her continued better control and weight loss  To check readings after meals consistently  Given coupon for free month supply of Trulicity  She will need to apply for patient assistance program next year for this  Continue metformin unchanged and discussed benefits on insulin sensitivity  Restart exercise  Follow-up in 3 months   Familial hypercholesterolemia with LDL again over 200 She will continue with cardiology lipid clinic  Left arm numbness: She has occasional symptoms and this is long-standing, she needs to discuss this with her neurologist and recommended that she call her as soon as possible  HYPERTENSION without microalbuminuria: Not well controlled and she needs to discuss with cardiologist Although blood pressure is asymmetrical apparently her subclavian arteries are patent on review of her carotid studies recently  HYPOTHYROIDISM: Her TSH is somewhat higher than usual and not clear if she is symptomatic However recommend that she go up to 112 mcg Explained the significance of high TSH Since she does not want to buy a new prescription she will continue 100 mcg dose and add extra 1 tablet weekly We will recheck levels on the next visit  Muscle cramps: Unclear etiology and she will discuss with PCP  She also needs to establish with her PCP close by to coordinate her various problems which she is asking for help with today  Patient Instructions  Take only 1/2 GLIMEPERIDE at nite   Total visit time for evaluation and management of multiple  problems and counseling =25 minutes  Elayne Snare 04/16/2018, 3:51 PM   Note: This office note was prepared with Dragon voice recognition system technology. Any transcriptional errors that result from this process are unintentional.

## 2018-04-16 NOTE — Patient Instructions (Addendum)
Take only 1/2 GLIMEPERIDE at nite  2 thyroid pills 1x per week

## 2018-04-17 NOTE — Telephone Encounter (Signed)
Called pt re: [employer] received. Left a message for her to call back.

## 2018-04-17 NOTE — Telephone Encounter (Signed)
Pt sent another [employer]. This pt has been informed on the other message See for details.

## 2018-04-17 NOTE — Telephone Encounter (Signed)
Please call patient. At her recent OV we checked BP in both arms and it was near equal. Her subclavian velocities were normal on her recent study which rules out subclavian stenosis, the most common reason for BP differences. So as long as no acute change in symptoms otherwise, OK to continue to monitor, but please bring her in for a nurse BP recheck to check in both arms by our office. Nour Scalise PA-C

## 2018-04-22 ENCOUNTER — Encounter: Payer: Medicare Other | Admitting: Internal Medicine

## 2018-04-24 ENCOUNTER — Ambulatory Visit (INDEPENDENT_AMBULATORY_CARE_PROVIDER_SITE_OTHER): Payer: Medicare Other

## 2018-04-24 VITALS — BP 140/78 | Ht 64.0 in | Wt 167.5 lb

## 2018-04-24 DIAGNOSIS — I10 Essential (primary) hypertension: Secondary | ICD-10-CM | POA: Diagnosis not present

## 2018-04-24 NOTE — Progress Notes (Signed)
1.) Reason for visit: Blood pressure check  2.) Name of MD requesting visit: Dayna Dunn PA-C  3.) H&P: HTN, HLD and CVA  4.) ROS related to problem: The patient was feeling good today.  5.) Assessment and plan per MD: Blood pressures in both arms were very similar, the blood pressure was more difficult in the left arm, but to being very quiet, potential CVA damage. Dr. Burt Knack (DOD) reviewed the blood pressures and was waiting for Dayna Dunn to address.

## 2018-04-25 ENCOUNTER — Encounter: Payer: Self-pay | Admitting: *Deleted

## 2018-04-25 MED ORDER — AMLODIPINE BESYLATE 10 MG PO TABS: 10.0000 mg | ORAL_TABLET | Freq: Every day | ORAL | 3 refills | Status: DC

## 2018-04-25 NOTE — Progress Notes (Signed)
Per one of the most recent result notes we asked her to increase amlodipine to 2 tablets (10mg ) daily. Please find out if she has done so, this was just the day before the BP check. It hasn't been enough time to really see the effect so I would encourage her to continue to follow BP and write in if >130 regularly over the next 3 days. Would check in R arm since the left arm has been traditionally challenging to hear. Kailie Polus PA-C

## 2018-04-25 NOTE — Addendum Note (Signed)
Addended by: Gaetano Net on: 04/25/2018 08:26 AM   Modules accepted: Orders

## 2018-05-01 ENCOUNTER — Ambulatory Visit (HOSPITAL_COMMUNITY)
Admission: RE | Admit: 2018-05-01 | Discharge: 2018-05-01 | Disposition: A | Discharge status: Home / Self Care | Payer: Medicare Other | Source: Ambulatory Visit | Attending: Physician Assistant | Admitting: Physician Assistant

## 2018-05-01 DIAGNOSIS — M79602 Pain in left arm: Secondary | ICD-10-CM

## 2018-05-01 DIAGNOSIS — E782 Mixed hyperlipidemia: Secondary | ICD-10-CM | POA: Diagnosis not present

## 2018-05-01 DIAGNOSIS — R011 Cardiac murmur, unspecified: Secondary | ICD-10-CM

## 2018-05-01 DIAGNOSIS — I208 Other forms of angina pectoris: Secondary | ICD-10-CM | POA: Diagnosis not present

## 2018-05-01 DIAGNOSIS — I2089 Other forms of angina pectoris: Secondary | ICD-10-CM

## 2018-05-01 DIAGNOSIS — Z006 Encounter for examination for normal comparison and control in clinical research program: Secondary | ICD-10-CM

## 2018-05-01 DIAGNOSIS — I2510 Atherosclerotic heart disease of native coronary artery without angina pectoris: Secondary | ICD-10-CM

## 2018-05-01 DIAGNOSIS — R200 Anesthesia of skin: Secondary | ICD-10-CM

## 2018-05-01 DIAGNOSIS — R0602 Shortness of breath: Secondary | ICD-10-CM | POA: Diagnosis not present

## 2018-05-01 DIAGNOSIS — I10 Essential (primary) hypertension: Secondary | ICD-10-CM | POA: Diagnosis not present

## 2018-05-01 MED ORDER — NITROGLYCERIN 0.4 MG SL SUBL
0.8000 mg | SUBLINGUAL_TABLET | SUBLINGUAL | Status: DC | PRN
  Administered 2018-05-01: 0.8 mg via SUBLINGUAL
  Filled 2018-05-01: qty 25

## 2018-05-01 MED ORDER — NITROGLYCERIN 0.4 MG SL SUBL
SUBLINGUAL_TABLET | SUBLINGUAL | Status: AC
  Filled 2018-05-01: qty 2

## 2018-05-01 NOTE — Research (Signed)
Cadfem Informed Consent    Patient Name: Joanna [name]   Subject met inclusion and exclusion criteria.  The informed consent form, study requirements and expectations were reviewed with the subject and questions and concerns were addressed prior to the signing of the consent form.  The subject verbalized understanding of the trail requirements.  The subject agreed to participate in the CADFEM trial and signed the informed consent.  The informed consent was obtained prior to performance of any protocol-specific procedures for the subject.  A copy of the signed informed consent was given to the subject and a copy was placed in the subject's medical record.   Neva Seat  05/01/2018 2:17 PM

## 2018-05-01 NOTE — Progress Notes (Signed)
Patient d/c. Ambulated to waiting room with steady gait. Currently waiting for family to pick patient up to take patient home. Patient stable at d/c.

## 2018-05-01 NOTE — Progress Notes (Signed)
Patient denies any complaints. Offered patient snack and beverage, patient declined.

## 2018-05-03 ENCOUNTER — Ambulatory Visit (HOSPITAL_COMMUNITY)
Admission: RE | Admit: 2018-05-03 | Discharge: 2018-05-03 | Disposition: A | Discharge status: Home / Self Care | Payer: Medicare Other | Source: Ambulatory Visit | Attending: Cardiology | Admitting: Cardiology

## 2018-05-03 ENCOUNTER — Other Ambulatory Visit: Payer: Self-pay | Admitting: Cardiology

## 2018-05-03 ENCOUNTER — Telehealth: Payer: Self-pay | Admitting: *Deleted

## 2018-05-03 DIAGNOSIS — I2510 Atherosclerotic heart disease of native coronary artery without angina pectoris: Secondary | ICD-10-CM | POA: Insufficient documentation

## 2018-05-03 MED ORDER — IOPAMIDOL (ISOVUE-370) INJECTION 76%
80.0000 mL | Freq: Once | INTRAVENOUS | Status: AC | PRN
  Administered 2018-05-03: 80 mL via INTRAVENOUS

## 2018-05-03 MED ORDER — IOPAMIDOL (ISOVUE-370) INJECTION 76%
100.0000 mL | Freq: Once | INTRAVENOUS | Status: AC | PRN
  Administered 2018-05-03: 100 mL via INTRAVENOUS

## 2018-05-03 NOTE — Telephone Encounter (Signed)
Busy signal will try again later ./cy

## 2018-05-03 NOTE — Telephone Encounter (Signed)
More Detail >>  Visit Follow-Up Question  Joanna [name]  Sent: Fri May 03, 2018 12:03 PM  To: [facility]      Message   I had a Cardiac CT on 05/01/2018. I was wondering if there was any problems found to cause BP to going up and   numbness to increase in my left arm. My BP is still running high even with the big increase in meds. Today at 12   it was 141/82 and took all BP pills at 8am.  Select Font Size      Joanna [name]  05/03/2018  Patient Message  MRN:  [id number]  Description: 67 year old female Provider: Mychart, Generic Provider Department: [facility] [address] [facility]     Call Documentation   No notes of this type exist for this encounter.  Encounter MyChart Messages   Read Composed From To  Subject  Y 05/03/2018 12:03 PM Paulla Fore, PA-C  Visit Follow-Up Question  Created by   Mychart, Generic on 05/03/2018 12:03 PM

## 2018-05-05 ENCOUNTER — Other Ambulatory Visit: Payer: Self-pay | Admitting: Endocrinology

## 2018-05-06 NOTE — Telephone Encounter (Signed)
[id number] 12/16

## 2018-05-08 ENCOUNTER — Encounter: Payer: Self-pay | Admitting: Physician Assistant

## 2018-05-08 ENCOUNTER — Other Ambulatory Visit: Payer: Self-pay

## 2018-05-08 ENCOUNTER — Inpatient Hospital Stay (HOSPITAL_COMMUNITY)
Admission: AD | Admit: 2018-05-08 | Discharge: 2018-05-10 | DRG: 247 | Disposition: A | Discharge status: Home / Self Care | Payer: Medicare Other | Source: Ambulatory Visit | Attending: Cardiology | Admitting: Cardiology

## 2018-05-08 ENCOUNTER — Observation Stay (HOSPITAL_COMMUNITY): Payer: Medicare Other

## 2018-05-08 ENCOUNTER — Ambulatory Visit (INDEPENDENT_AMBULATORY_CARE_PROVIDER_SITE_OTHER): Payer: Medicare Other | Admitting: Physician Assistant

## 2018-05-08 ENCOUNTER — Encounter (HOSPITAL_COMMUNITY): Payer: Self-pay | Admitting: General Practice

## 2018-05-08 VITALS — BP 180/90 | HR 70 | Ht 64.0 in | Wt 165.8 lb

## 2018-05-08 DIAGNOSIS — E785 Hyperlipidemia, unspecified: Secondary | ICD-10-CM | POA: Diagnosis present

## 2018-05-08 DIAGNOSIS — M79602 Pain in left arm: Secondary | ICD-10-CM | POA: Diagnosis present

## 2018-05-08 DIAGNOSIS — G4733 Obstructive sleep apnea (adult) (pediatric): Secondary | ICD-10-CM | POA: Diagnosis present

## 2018-05-08 DIAGNOSIS — Z79890 Hormone replacement therapy (postmenopausal): Secondary | ICD-10-CM

## 2018-05-08 DIAGNOSIS — I350 Nonrheumatic aortic (valve) stenosis: Secondary | ICD-10-CM

## 2018-05-08 DIAGNOSIS — Z79899 Other long term (current) drug therapy: Secondary | ICD-10-CM

## 2018-05-08 DIAGNOSIS — E039 Hypothyroidism, unspecified: Secondary | ICD-10-CM | POA: Diagnosis present

## 2018-05-08 DIAGNOSIS — I25119 Atherosclerotic heart disease of native coronary artery with unspecified angina pectoris: Secondary | ICD-10-CM

## 2018-05-08 DIAGNOSIS — Z8279 Family history of other congenital malformations, deformations and chromosomal abnormalities: Secondary | ICD-10-CM

## 2018-05-08 DIAGNOSIS — E119 Type 2 diabetes mellitus without complications: Secondary | ICD-10-CM | POA: Diagnosis present

## 2018-05-08 DIAGNOSIS — R29898 Other symptoms and signs involving the musculoskeletal system: Secondary | ICD-10-CM | POA: Diagnosis not present

## 2018-05-08 DIAGNOSIS — I10 Essential (primary) hypertension: Secondary | ICD-10-CM | POA: Diagnosis present

## 2018-05-08 DIAGNOSIS — I082 Rheumatic disorders of both aortic and tricuspid valves: Secondary | ICD-10-CM | POA: Diagnosis present

## 2018-05-08 DIAGNOSIS — M25572 Pain in left ankle and joints of left foot: Secondary | ICD-10-CM | POA: Diagnosis present

## 2018-05-08 DIAGNOSIS — Z8349 Family history of other endocrine, nutritional and metabolic diseases: Secondary | ICD-10-CM

## 2018-05-08 DIAGNOSIS — Z7982 Long term (current) use of aspirin: Secondary | ICD-10-CM

## 2018-05-08 DIAGNOSIS — Z86010 Personal history of colonic polyps: Secondary | ICD-10-CM

## 2018-05-08 DIAGNOSIS — Z87442 Personal history of urinary calculi: Secondary | ICD-10-CM

## 2018-05-08 DIAGNOSIS — Z95820 Peripheral vascular angioplasty status with implants and grafts: Secondary | ICD-10-CM

## 2018-05-08 DIAGNOSIS — Z8673 Personal history of transient ischemic attack (TIA), and cerebral infarction without residual deficits: Secondary | ICD-10-CM

## 2018-05-08 DIAGNOSIS — I200 Unstable angina: Secondary | ICD-10-CM

## 2018-05-08 DIAGNOSIS — I440 Atrioventricular block, first degree: Secondary | ICD-10-CM | POA: Diagnosis present

## 2018-05-08 DIAGNOSIS — Z9851 Tubal ligation status: Secondary | ICD-10-CM

## 2018-05-08 DIAGNOSIS — I25110 Atherosclerotic heart disease of native coronary artery with unstable angina pectoris: Principal | ICD-10-CM | POA: Diagnosis present

## 2018-05-08 DIAGNOSIS — Z888 Allergy status to other drugs, medicaments and biological substances status: Secondary | ICD-10-CM

## 2018-05-08 DIAGNOSIS — K760 Fatty (change of) liver, not elsewhere classified: Secondary | ICD-10-CM | POA: Diagnosis present

## 2018-05-08 DIAGNOSIS — Z882 Allergy status to sulfonamides status: Secondary | ICD-10-CM

## 2018-05-08 DIAGNOSIS — R931 Abnormal findings on diagnostic imaging of heart and coronary circulation: Secondary | ICD-10-CM

## 2018-05-08 DIAGNOSIS — Z7984 Long term (current) use of oral hypoglycemic drugs: Secondary | ICD-10-CM

## 2018-05-08 DIAGNOSIS — G8929 Other chronic pain: Secondary | ICD-10-CM | POA: Diagnosis present

## 2018-05-08 DIAGNOSIS — F419 Anxiety disorder, unspecified: Secondary | ICD-10-CM | POA: Diagnosis present

## 2018-05-08 DIAGNOSIS — I2510 Atherosclerotic heart disease of native coronary artery without angina pectoris: Secondary | ICD-10-CM

## 2018-05-08 HISTORY — DX: Dependence on other enabling machines and devices: Z99.89

## 2018-05-08 HISTORY — DX: Type 2 diabetes mellitus without complications: E11.9

## 2018-05-08 HISTORY — DX: Cardiac murmur, unspecified: R01.1

## 2018-05-08 HISTORY — DX: Malignant melanoma of skin, unspecified: C43.9

## 2018-05-08 HISTORY — DX: Personal history of urinary calculi: Z87.442

## 2018-05-08 HISTORY — DX: Obstructive sleep apnea (adult) (pediatric): G47.33

## 2018-05-08 LAB — COMPREHENSIVE METABOLIC PANEL
ALT: 66 U/L — ABNORMAL HIGH (ref 0–44)
AST: 39 U/L (ref 15–41)
Albumin: 5 g/dL (ref 3.5–5.0)
Alkaline Phosphatase: 60 U/L (ref 38–126)
Anion gap: 15 (ref 5–15)
BUN: 13 mg/dL (ref 8–23)
CO2: 26 mmol/L (ref 22–32)
Calcium: 10.4 mg/dL — ABNORMAL HIGH (ref 8.9–10.3)
Chloride: 97 mmol/L — ABNORMAL LOW (ref 98–111)
Creatinine, Ser: 0.71 mg/dL (ref 0.44–1.00)
GFR calc non Af Amer: >60 mL/min (ref >60)
Glucose, Bld: 95 mg/dL (ref 70–99)
POTASSIUM: 3.9 mmol/L (ref 3.5–5.1)
SODIUM: 138 mmol/L (ref 135–145)
Total Bilirubin: 1.2 mg/dL (ref 0.3–1.2)
Total Protein: 8.3 g/dL — ABNORMAL HIGH (ref 6.5–8.1)

## 2018-05-08 LAB — HEMOGLOBIN A1C
Hgb A1c MFr Bld: 5.5 % (ref 4.8–5.6)
MEAN PLASMA GLUCOSE: 111.15 mg/dL

## 2018-05-08 LAB — CBC
HCT: 45.6 % (ref 36.0–46.0)
Hemoglobin: 15.1 g/dL — ABNORMAL HIGH (ref 12.0–15.0)
MCH: 29.2 pg (ref 26.0–34.0)
MCHC: 33.1 g/dL (ref 30.0–36.0)
MCV: 88.2 fL (ref 80.0–100.0)
NRBC: 0 % (ref 0.0–0.2)
Platelets: 331 10*3/uL (ref 150–400)
RBC: 5.17 MIL/uL — ABNORMAL HIGH (ref 3.87–5.11)
RDW: 12.7 % (ref 11.5–15.5)
WBC: 8 10*3/uL (ref 4.0–10.5)

## 2018-05-08 LAB — TSH: TSH: 1.908 u[IU]/mL (ref 0.350–4.500)

## 2018-05-08 LAB — GLUCOSE, CAPILLARY
Glucose-Capillary: 87 mg/dL (ref 70–99)
Glucose-Capillary: 96 mg/dL (ref 70–99)

## 2018-05-08 LAB — MAGNESIUM: Magnesium: 1.9 mg/dL (ref 1.7–2.4)

## 2018-05-08 LAB — T4, FREE: FREE T4: 1.15 ng/dL (ref 0.82–1.77)

## 2018-05-08 LAB — TROPONIN I: Troponin I: <0.03 ng/mL (ref <0.03)

## 2018-05-08 MED ORDER — LEVOTHYROXINE SODIUM 112 MCG PO TABS
112.0000 ug | ORAL_TABLET | Freq: Every day | ORAL | Status: DC
  Administered 2018-05-09 – 2018-05-10 (×2): 112 ug via ORAL
  Filled 2018-05-08 (×2): qty 1

## 2018-05-08 MED ORDER — CARVEDILOL 3.125 MG PO TABS: 3.1250 mg | ORAL_TABLET | Freq: Two times a day (BID) | ORAL | 3 refills | Status: DC

## 2018-05-08 MED ORDER — ASPIRIN EC 81 MG PO TBEC
81.0000 mg | DELAYED_RELEASE_TABLET | Freq: Every day | ORAL | Status: DC
  Administered 2018-05-10: 81 mg via ORAL
  Filled 2018-05-08 (×2): qty 1

## 2018-05-08 MED ORDER — NITROGLYCERIN 0.4 MG SL SUBL: 0.4000 mg | SUBLINGUAL_TABLET | SUBLINGUAL | Status: DC | PRN

## 2018-05-08 MED ORDER — RAMIPRIL 10 MG PO CAPS
10.0000 mg | ORAL_CAPSULE | Freq: Every day | ORAL | Status: DC
  Administered 2018-05-09 – 2018-05-10 (×2): 10 mg via ORAL
  Filled 2018-05-08 (×2): qty 1

## 2018-05-08 MED ORDER — SODIUM CHLORIDE 0.9 % IV SOLN: 250.0000 mL | INTRAVENOUS | Status: DC | PRN

## 2018-05-08 MED ORDER — ONDANSETRON HCL 4 MG/2ML IJ SOLN: 4.0000 mg | Freq: Four times a day (QID) | INTRAMUSCULAR | Status: DC | PRN

## 2018-05-08 MED ORDER — VITAMIN D 25 MCG (1000 UNIT) PO TABS
2000.0000 [IU] | ORAL_TABLET | Freq: Every day | ORAL | Status: DC
  Administered 2018-05-09 – 2018-05-10 (×2): 2000 [IU] via ORAL
  Filled 2018-05-08 (×2): qty 2

## 2018-05-08 MED ORDER — SODIUM CHLORIDE 0.9% FLUSH
3.0000 mL | Freq: Two times a day (BID) | INTRAVENOUS | Status: DC
  Administered 2018-05-09: 3 mL via INTRAVENOUS

## 2018-05-08 MED ORDER — SODIUM CHLORIDE 0.9% FLUSH: 3.0000 mL | INTRAVENOUS | Status: DC | PRN

## 2018-05-08 MED ORDER — HYDROCORTISONE 2.5 % RE CREA: TOPICAL_CREAM | Freq: Three times a day (TID) | RECTAL | Status: DC | PRN

## 2018-05-08 MED ORDER — ROSUVASTATIN CALCIUM 5 MG PO TABS
5.0000 mg | ORAL_TABLET | Freq: Every day | ORAL | Status: DC
  Administered 2018-05-09 – 2018-05-10 (×2): 5 mg via ORAL
  Filled 2018-05-08 (×2): qty 1

## 2018-05-08 MED ORDER — ASPIRIN 81 MG PO CHEW
324.0000 mg | CHEWABLE_TABLET | ORAL | Status: AC
  Administered 2018-05-08: 324 mg via ORAL
  Filled 2018-05-08: qty 4

## 2018-05-08 MED ORDER — ACETAMINOPHEN 325 MG PO TABS
650.0000 mg | ORAL_TABLET | ORAL | Status: DC | PRN
  Administered 2018-05-08: 650 mg via ORAL
  Filled 2018-05-08: qty 2

## 2018-05-08 MED ORDER — MAGNESIUM 250 MG PO TABS: 1.0000 | ORAL_TABLET | Freq: Two times a day (BID) | ORAL | Status: DC

## 2018-05-08 MED ORDER — ASPIRIN 300 MG RE SUPP: 300.0000 mg | RECTAL | Status: AC

## 2018-05-08 MED ORDER — MAGNESIUM OXIDE 400 (241.3 MG) MG PO TABS
200.0000 mg | ORAL_TABLET | Freq: Every day | ORAL | Status: DC
  Administered 2018-05-09 – 2018-05-10 (×2): 200 mg via ORAL
  Filled 2018-05-08 (×2): qty 1

## 2018-05-08 MED ORDER — SODIUM CHLORIDE 0.9% FLUSH
3.0000 mL | Freq: Two times a day (BID) | INTRAVENOUS | Status: DC
  Administered 2018-05-08 – 2018-05-09 (×2): 3 mL via INTRAVENOUS

## 2018-05-08 MED ORDER — AMLODIPINE BESYLATE 10 MG PO TABS
10.0000 mg | ORAL_TABLET | Freq: Every day | ORAL | Status: DC
  Administered 2018-05-09 – 2018-05-10 (×2): 10 mg via ORAL
  Filled 2018-05-08 (×2): qty 1

## 2018-05-08 MED ORDER — INSULIN ASPART 100 UNIT/ML ~~LOC~~ SOLN
0.0000 [IU] | Freq: Three times a day (TID) | SUBCUTANEOUS | Status: DC
  Administered 2018-05-10: 1 [IU] via SUBCUTANEOUS

## 2018-05-08 MED ORDER — ASPIRIN 81 MG PO CHEW
81.0000 mg | CHEWABLE_TABLET | ORAL | Status: AC
  Administered 2018-05-09: 81 mg via ORAL
  Filled 2018-05-08: qty 1

## 2018-05-08 MED ORDER — SODIUM CHLORIDE 0.9 % WEIGHT BASED INFUSION
3.0000 mL/kg/h | INTRAVENOUS | Status: DC
  Administered 2018-05-09: 3 mL/kg/h via INTRAVENOUS

## 2018-05-08 MED ORDER — CARVEDILOL 3.125 MG PO TABS
3.1250 mg | ORAL_TABLET | Freq: Two times a day (BID) | ORAL | Status: DC
  Administered 2018-05-08 – 2018-05-10 (×4): 3.125 mg via ORAL
  Filled 2018-05-08 (×4): qty 1

## 2018-05-08 MED ORDER — SODIUM CHLORIDE 0.9 % WEIGHT BASED INFUSION
1.0000 mL/kg/h | INTRAVENOUS | Status: DC
  Administered 2018-05-09: 250 mL via INTRAVENOUS

## 2018-05-08 NOTE — Telephone Encounter (Addendum)
Please call patient.    I'm not sure why her CT scan result went to Dr. Francesca Oman box, can please you find out what happened and re-route to mine? Dr. Meda Coffee does not appear to have acted on result from 05/03/18. Cardiac CT appears limited by motion artifact but did suggest there might be a potential blockage in the artery in the front of her heart (LAD). I suspect given her symptoms and history she would benefit from a heart catheterization. She will need office visit to discuss in further detail and to re-evaluate blood pressure in person. Regarding blood pressure, I would suggest we go ahead and add carvedilol 3.125mg  BID for both blood pressure and probable coronary artery disease. Metoprolol is listed on med list but this was only pre cardiac CT.  May need to involve admin or IT if it was deemed the result didn't go to a providers inbox as above.   Dayna Dunn PA-C

## 2018-05-08 NOTE — Telephone Encounter (Signed)
Addressed today at [place] placed by J Witty

## 2018-05-08 NOTE — Patient Instructions (Addendum)
You are being direct admitted to [facility] straight over to Admissions in the [facility] will be on floor [address]

## 2018-05-08 NOTE — H&P (View-Only) (Signed)
Cardiology Office Note    Date:  05/08/2018  ID:  Joanna [name], DOB 1950-06-16, MRN [id number] PCP:  [name], MD  Cardiologist:  Candee Furbish, MD   Chief Complaint: abnormal cardiac CT scan  History of Present Illness:  Joanna [name] is a 67 y.o. 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  To recap, she had acute right thalamic infarct in 04/2016 with CTA head/neck showing small left vertebral originated directly from arch, with high grade ostial stenosis but likely no significance with regard to right thalamic infarct, no intracranial or extracranial stenosis. She was followed by neurology who felt this was due to small vessel disease. She was started on daily aspirin. She did not receive IV t-PA due to delay in arrival. 2D echo at that time showed mild LVH, grade 1 DD, normal LVEF, trivial tricuspid regurgitation. She has had intolerances to Crestor, Lipitor, Niacin, Zocor, Pravastatin. She believes that Lipitor contributed to her development of diabetes. She also recalled history of bladder bleeding, hot flashes and flu-like symptoms which she attributed to statin. She has had nausea/vomiting with Crestor, nausea/vomiting with niacin, myalgias with pravastatin, and GI upset with Zocor and Zetia. She has declined PCSK9 due to cost concerns, but has been agreeable to retrial of Crestor. She initially had marked improvement in LDL from 192 to 70 in 08/2017, however, she stopped this shortly after due to anxiety and stomach problems. She even tried to stretch it out or have husband hide in pillbox in case of placebo effect but the medicine definitely exacerbated those symptoms. She has since resumed at 1/4 tablet daily.  She was seen in clinic 03/19/18 with persistent left arm discomfort which was present ever since her prior stroke. However, it  has become more frequent and longstanding. It could occur for days at at time. She said neurology had reported this was to be expected but it has become more noticeable. It is not so much a weakness, but rather a discomfort/heaviness. She had also recently noticed some association with exertion and indicated family had pointed out she appeared more SOB with exertion. A more prominent murmur was noted. She's also had some discrepancy in BP between R/L arms. 2D echo 03/25/18 showed EF 65-70%, grade 1 DD, moderate AS, mild LAE. Carotid duplex 03/22/18 showed 4-33% stenosis in LICA otherwise normal, including subclavians. BP meds have been titrated to address symptoms and HTN. Cardiac CT was undertaken. There was considerable difficulty getting this pre-certed through her insurance. It was performed 05/03/18 but listed under Dr. Francesca Oman name so had not come to my box. It is unclear what happened with result. Patient reached out via [facility] today to inquire about result. This showed diffuse CAD with 50-69% stenosis in the ostial RCA, 50-69% stenosis in the mid LAD and in a proximal portion of a small 1. diagonal artery, study affected by motion. FFR analysis showed significant stenosis in the mid LAD. She does note that her overall discomfort seemed to improve significantly when administered nitroglycerin for the study. Last labs 03/2018 showed K 5.1, Cr 0.77, AST 46/ALT 75, A1C 6.2, 02/2018 LDL 190, trig 217.  She returns for follow-up today to discuss above result. She is tearful because she feels the left arm discomfort is getting worse. She also has other somatic complaints unrelated to above situation (chronic ankle pain, intermittent abdominal cramps)  which are generally unchanged. She is tearful at how bad she has been feeling lately. Recheck BP by me in R arm was 150/90.  Past Medical History:  Diagnosis Date  . Abnormal liver function tests   . Adenomatous colon polyp   . Cerebrovascular disease    a. CT  04/2016 -calcific plaque at the origin LEFT vertebral contributing to severe stenosis.  . Diabetes mellitus    type II--ORAL MEDICATION - NO INSULIN  . Diverticulitis of colon   . Fatty liver    a. mild fatty liver infiltration by CT 2014.  Marland Kitchen Foot injury 08   Left foot/leg with ?? torn muscle -PROBLEM HAS RESOLVED  . Hand pain 1/09   steroid injections--RESOLVED  . Hx of renal calculi    LEFT  . Hyperlipidemia   . Hypertension   . Hypothyroidism    pt states hx of thyroid nodules  . IBS (irritable bowel syndrome)   . Internal hemorrhoids   . Sleep apnea    USES CPAP - SETTING IS 14  . Statin intolerance   . Statin intolerance   . Stroke [facility])    a. right thalamic infarct in 04/2016 felt by neuro to be due to small vessel disease.    Past Surgical History:  Procedure Laterality Date  . COLONOSCOPY W/ BIOPSIES AND POLYPECTOMY  05/19/2005   adenomatous polyps  . NEPHROLITHOTOMY Left 01/27/2013   Procedure: NEPHROLITHOTOMY PERCUTANEOUS;  Surgeon: Dutch Gray, MD;  Location: [facility] ORS;  Service: Urology;  Laterality: Left;  . TUBAL LIGATION    . WISDOM TEETH EXTRACTED      Current Medications: Current Meds  Medication Sig  . amLODipine (NORVASC) 10 MG tablet Take 1 tablet (10 mg total) by mouth daily.  Marland Kitchen aspirin EC 81 MG tablet Take 1 tablet (81 mg total) by mouth daily.  . carvedilol (COREG) 3.125 MG tablet Take 1 tablet (3.125 mg total) by mouth 2 (two) times daily.  . cholecalciferol (VITAMIN D) 1000 units tablet Take 2,000 Units by mouth daily.  . Dulaglutide (TRULICITY) 1.5 ZO/1.0RU SOPN INJECT CONTENTS OF PEN WEEKLY  . glimepiride (AMARYL) 1 MG tablet TAKE 1 TABLET (1 MG TOTAL) BY MOUTH AT BEDTIME.  Marland Kitchen glucose blood (ONE TOUCH ULTRA TEST) test strip USE TO CHECK BLOOD SUGAR TWICE DAILY  Dx:E11.65  . hydrocortisone (ANUSOL-HC) 2.5 % rectal cream Use small amount in the perianal area 3 times daily as needed.  Marland Kitchen levothyroxine (SYNTHROID) 112 MCG tablet Take 1 tablet (112 mcg  total) by mouth daily before breakfast.  . Magnesium 250 MG TABS Take 1 tablet by mouth 2 (two) times daily.  . metFORMIN (GLUCOPHAGE) 1000 MG tablet TAKE 1 TABLET (1,000 MG TOTAL) BY MOUTH 2 (TWO) TIMES DAILY.  . metoprolol tartrate (LOPRESSOR) 100 MG tablet Take 1 tablet 2 hours before your ct scan  . ramipril (ALTACE) 10 MG capsule Take 1 capsule (10 mg total) by mouth daily.  . rosuvastatin (CRESTOR) 5 MG tablet Take 1.25 mg by mouth daily.  . [DISCONTINUED] rosuvastatin (CRESTOR) 5 MG tablet Take 2.5 mg by mouth daily.      Allergies:   Crestor [rosuvastatin]; Lipitor [atorvastatin]; Pravastatin sodium; Sulfonamide derivatives; Zetia [ezetimibe]; Zocor [simvastatin]; and Niacin   Social History   Socioeconomic History  . Marital status: Married    Spouse name: Not on file  . Number of children: 3  . Years of education: Not on file  . Highest education level: Not on file  Occupational History  .  Occupation: Producer, television/film/video: [employer]    Comment: sits most of day   Social Needs  . Financial resource strain: Not on file  . Food insecurity:    Worry: Not on file    Inability: Not on file  . Transportation needs:    Medical: Not on file    Non-medical: Not on file  Tobacco Use  . Smoking status: Never Smoker  . Smokeless tobacco: Never Used  Substance and Sexual Activity  . Alcohol use: No  . Drug use: No  . Sexual activity: Not on file  Lifestyle  . Physical activity:    Days per week: Not on file    Minutes per session: Not on file  . Stress: Not on file  Relationships  . Social connections:    Talks on phone: Not on file    Gets together: Not on file    Attends religious service: Not on file    Active member of club or organization: Not on file    Attends meetings of clubs or organizations: Not on file    Relationship status: Not on file  Other Topics Concern  . Not on file  Social History Narrative   Married, one son to daughters. She does office  work. One caffeinated drink daily.   Updated as of 04/30/2013     Family History:  The patient's family history includes Hyperlipidemia in her brother, mother, and sister; Neurofibromatosis in her father; Thyroid disease in her daughter. There is no history of Diabetes, Stroke, Colon cancer, Esophageal cancer, Pancreatic cancer, or Liver cancer.  ROS:   Please see the history of present illness. All other systems are reviewed and otherwise negative.    PHYSICAL EXAM:   VS:  BP (!) 180/90 (BP Location: Right Arm)   Pulse 70   Ht 5\' 4"  (1.626 m)   Wt 165 lb 12.8 oz (75.2 kg)   SpO2 98%   BMI 28.46 kg/m   BMI: Body mass index is 28.46 kg/m. GEN: Well nourished, well developed WF, in no acute distress HEENT: normocephalic, atraumatic Neck: no JVD, carotid bruits, or masses Cardiac: RRR 2/6 SEM, no rubs or gallops, no edema  Respiratory:  clear to auscultation bilaterally, normal work of breathing GI: soft, nontender, nondistended, + BS MS: no deformity or atrophy Skin: warm and dry, no rash Neuro:  Alert and Oriented x 3, Strength and sensation are intact, follows commands Psych: euthymic mood, full affect  Wt Readings from Last 3 Encounters:  05/08/18 165 lb 12.8 oz (75.2 kg)  04/24/18 167 lb 8 oz (76 kg)  04/16/18 166 lb (75.3 kg)      Studies/Labs Reviewed:   EKG:  EKG was ordered today and personally reviewed by me and demonstrates NSR 70bpm, nonspecific ST-T Changes.  Recent Labs: 03/19/2018: Hemoglobin 14.4; Platelets 278 04/11/2018: ALT 75; BUN 17; Creatinine, Ser 0.77; Potassium 5.1; Sodium 140; TSH 4.87   Lipid Panel    Component Value Date/Time   CHOL 276 (H) 03/19/2018 1643   TRIG 217 (H) 03/19/2018 1643   HDL 43 03/19/2018 1643   CHOLHDL 6.4 (H) 03/19/2018 1643   CHOLHDL 3 09/13/2017 0817   VLDL 22.8 09/13/2017 0817   LDLCALC 190 (H) 03/19/2018 1643   LDLDIRECT 209 (H) 03/19/2018 1643   LDLDIRECT 170.0 12/11/2016 0943    Additional studies/ records  that were reviewed today include: Summarized above.   ASSESSMENT & PLAN:   1. Abnormal cardiac CT scan/Left arm pain -  mixed atypical/typical features. Her symptoms have progressed, with some exertional component. Interesting she said her symptoms improved significantly when administered the NTG from her cardiac CT Scan. She is tearful and indicates she feels poorly enough to warrant admission to the hospital. The left arm heaviness has continued to wax and wane with increased intensity. I reviewed with Dr. Caryl Comes. Cardiac CT reader recommended cardiac cath based on study result. Although symptoms are somewhat atypical I do feel this is warranted. We will admit and cycle troponins. If positive, will need IV heparin per pharmacy. Continue aspirin. Begin carvedilol for anti-anginal therapy and blood pressure (prescribed earlier today by phone note). Plan cath tomorrow with Dr. Saunders Revel. Risks and benefits of cardiac catheterization have been discussed with the patient.  These include bleeding, infection, kidney damage, stroke, heart attack, death.  The patient understands these risks and is willing to proceed. Given atypical L arm symptoms Dr. Caryl Comes also suggests undertaking a CT scan of the head this evening to exclude any unusual progression from a neurologic standpoint. If her cath suggests disease to be treated medically, consider addition of long-acting nitrate. 2. HTN - poorly controlled despite recent med titration. Add carvedilol. 3. Hyperlipidemia - discussed again with patient in clinic. Her insurance previously did not cover PCSK9 years ago, but I suspect they would now with recent formulary changes. Suggest referral to lipid clinic upon discharge. 4. Moderate aortic stenosis - continue to follow clinically for now.  Disposition: F/u was tentatively made for 2 weeks; keep this appt post-discharge.  Medication Adjustments/Labs and Tests Ordered: Current medicines are reviewed at length with the  patient today.  Concerns regarding medicines are outlined above. Medication changes, Labs and Tests ordered today are summarized above and listed in the Patient Instructions accessible in Encounters.   Signed, Charlie Pitter, PA-C  05/08/2018 3:17 PM    [facility] [place], [place], [place]  [address] Phone: [phone]; Fax: [phone]

## 2018-05-08 NOTE — H&P (Addendum)
Cardiology Office Note    Date:  05/08/2018  ID:  [name], DOB November 01, 1950, MRN [id number] PCP:  Joanna Passy, MD         Cardiologist:  Joanna Furbish, MD   Chief Complaint: abnormal cardiac CT scan  History of Present Illness:  Joanna [name] is a 67 y.o. 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  To recap, she had acute right thalamic infarct in 04/2016 with CTA head/neck showing small left vertebral originated directly from arch, with high grade ostial stenosis but likely no significance with regard to right thalamic infarct, no intracranial or extracranial stenosis. She was followed by neurology who felt this was due to small vessel disease. She was started on daily aspirin. She did not receive IV t-PA due to delay in arrival. 2D echo at that time showed mild LVH, grade 1 DD, normal LVEF, trivial tricuspid regurgitation. She has had intolerances to Crestor, Lipitor, Niacin, Zocor, Pravastatin. She believes that Lipitor contributed to her development of diabetes. She also recalled history of bladder bleeding, hot flashes and flu-like symptoms which she attributed to statin. She has had nausea/vomiting with Crestor, nausea/vomiting with niacin, myalgias with pravastatin, and GI upset with Zocor and Zetia. She has declined PCSK9 due to cost concerns, but has been agreeable to retrial of Crestor. She initially had marked improvement in LDL from 192 to 70 in 08/2017, however, she stopped this shortly after due to anxiety and stomach problems. She even tried to stretch it out or have husband hide in pillbox in case of placebo effect but Joanna medicine definitely exacerbated those symptoms. She has since resumed at 1/4 tablet daily.  She was seen in clinic 03/19/18 with persistent left arm discomfort which was present ever since her prior stroke.  However, it has become more frequent and longstanding. It could occur for days at at time. She said neurology had reported this was to be expected but it has become more noticeable. It is not so much a weakness, but rather a discomfort/heaviness. She had also recently noticed some association with exertion and indicated family had pointed out she appeared more SOB with exertion. A more prominent murmur was noted. She's also had some discrepancy in BP between R/L arms. 2D echo 03/25/18 showed EF 65-70%, grade 1 DD, moderate AS, mild LAE. Carotid duplex 03/22/18 showed 8-67% stenosis in LICA otherwise normal, including subclavians. BP meds have been titrated to address symptoms and HTN. Cardiac CT was undertaken. There was considerable difficulty getting this pre-certed through her insurance. It was performed 05/03/18 but listed under Dr. Francesca Oman name so had not come to my box. It is unclear what happened with result. [name] reached out via [facility] today to inquire about result. This showed diffuse CAD with 50-69% stenosis in Joanna ostial RCA, 50-69% stenosis in Joanna mid LAD and in a proximal portion of a small 1. diagonal artery, study affected by motion. FFR analysis showed significant stenosis in Joanna mid LAD. She does note that her overall discomfort seemed to improve significantly when administered nitroglycerin for Joanna study. Last labs 03/2018 showed K 5.1, Cr 0.77, AST 46/ALT 75, A1C 6.2, 02/2018 LDL 190, trig 217.  She returns for follow-up today to discuss above result. She is tearful because she feels Joanna left arm discomfort is getting worse. She also has other somatic complaints unrelated to above  situation (chronic ankle pain, intermittent abdominal cramps, feet numbness) which are generally unchanged. She is tearful at how bad she has been feeling lately. Recheck BP by me in R arm was 150/90.      Past Medical History:  Diagnosis Date  . Abnormal liver function tests   . Adenomatous colon polyp     . Cerebrovascular disease    a. CT 04/2016 -calcific plaque at Joanna origin LEFT vertebral contributing to severe stenosis.  . Diabetes mellitus    type II--ORAL MEDICATION - NO INSULIN  . Diverticulitis of colon   . Fatty liver    a. mild fatty liver infiltration by CT 2014.  Marland Kitchen Foot injury 08   Left foot/leg with ?? torn muscle -PROBLEM HAS RESOLVED  . Hand pain 1/09   steroid injections--RESOLVED  . Hx of renal calculi    LEFT  . Hyperlipidemia   . Hypertension   . Hypothyroidism    pt states hx of thyroid nodules  . IBS (irritable bowel syndrome)   . Internal hemorrhoids   . Sleep apnea    USES CPAP - SETTING IS 14  . Statin intolerance   . Statin intolerance   . Stroke [facility])    a. right thalamic infarct in 04/2016 felt by neuro to be due to small vessel disease.         Past Surgical History:  Procedure Laterality Date  . COLONOSCOPY W/ BIOPSIES AND POLYPECTOMY  05/19/2005   adenomatous polyps  . NEPHROLITHOTOMY Left 01/27/2013   Procedure: NEPHROLITHOTOMY PERCUTANEOUS;  Surgeon: Dutch Gray, MD;  Location: [facility];  Service: Urology;  Laterality: Left;  . TUBAL LIGATION    . WISDOM TEETH EXTRACTED      Current Medications: ActiveMedications      Current Meds  Medication Sig  . amLODipine (NORVASC) 10 MG tablet Take 1 tablet (10 mg total) by mouth daily.  Marland Kitchen aspirin EC 81 MG tablet Take 1 tablet (81 mg total) by mouth daily.  . carvedilol (COREG) 3.125 MG tablet Take 1 tablet (3.125 mg total) by mouth 2 (two) times daily.  . cholecalciferol (VITAMIN D) 1000 units tablet Take 2,000 Units by mouth daily.  . Dulaglutide (TRULICITY) 1.5 YS/0.6TK SOPN INJECT CONTENTS OF PEN WEEKLY  . glimepiride (AMARYL) 1 MG tablet TAKE 1 TABLET (1 MG TOTAL) BY MOUTH AT BEDTIME.  Marland Kitchen glucose blood (ONE TOUCH ULTRA TEST) test strip USE TO CHECK BLOOD SUGAR TWICE DAILY  Dx:E11.65  . hydrocortisone (ANUSOL-HC) 2.5 % rectal cream Use small amount in Joanna  perianal area 3 times daily as needed.  Marland Kitchen levothyroxine (SYNTHROID) 112 MCG tablet Take 1 tablet (112 mcg total) by mouth daily before breakfast.  . Magnesium 250 MG TABS Take 1 tablet by mouth 2 (two) times daily.  . metFORMIN (GLUCOPHAGE) 1000 MG tablet TAKE 1 TABLET (1,000 MG TOTAL) BY MOUTH 2 (TWO) TIMES DAILY.  . metoprolol tartrate (LOPRESSOR) 100 MG tablet Take 1 tablet 2 hours before your ct scan  . ramipril (ALTACE) 10 MG capsule Take 1 capsule (10 mg total) by mouth daily.  . rosuvastatin (CRESTOR) 5 MG tablet Take 1.25 mg by mouth daily.  . [DISCONTINUED] rosuvastatin (CRESTOR) 5 MG tablet Take 2.5 mg by mouth daily.        Allergies:   Crestor [rosuvastatin]; Lipitor [atorvastatin]; Pravastatin sodium; Sulfonamide derivatives; Zetia [ezetimibe]; Zocor [simvastatin]; and Niacin   Social History        Socioeconomic History  . Marital status: Married  Spouse name: Not on file  . Number of children: 3  . Years of education: Not on file  . Highest education level: Not on file  Occupational History  . Occupation: Producer, television/film/video: [employer]    Comment: sits most of day   Social Needs  . Financial resource strain: Not on file  . Food insecurity:    Worry: Not on file    Inability: Not on file  . Transportation needs:    Medical: Not on file    Non-medical: Not on file  Tobacco Use  . Smoking status: Never Smoker  . Smokeless tobacco: Never Used  Substance and Sexual Activity  . Alcohol use: No  . Drug use: No  . Sexual activity: Not on file  Lifestyle  . Physical activity:    Days per week: Not on file    Minutes per session: Not on file  . Stress: Not on file  Relationships  . Social connections:    Talks on phone: Not on file    Gets together: Not on file    Attends religious service: Not on file    Active member of club or organization: Not on file    Attends meetings of clubs or organizations: Not on file     Relationship status: Not on file  Other Topics Concern  . Not on file  Social History Narrative   Married, one son to daughters. She does office work. One caffeinated drink daily.   Updated as of 04/30/2013     Family History:  Joanna [name]'s family history includes Hyperlipidemia in her brother, mother, and sister; Neurofibromatosis in her father; Thyroid disease in her daughter. There is no history of Diabetes, Stroke, Colon cancer, Esophageal cancer, Pancreatic cancer, or Liver cancer.  ROS:   Please see Joanna history of present illness. All other systems are reviewed and otherwise negative.    PHYSICAL EXAM:   VS:  BP (!) 180/90 (BP Location: Right Arm)   Pulse 70   Ht 5\' 4"  (1.626 m)   Wt 165 lb 12.8 oz (75.2 kg)   SpO2 98%   BMI 28.46 kg/m   BMI: Body mass index is 28.46 kg/m. GEN: Well nourished, well developed WF, in no acute distress HEENT: normocephalic, atraumatic Neck: no JVD, or masses. + L carotid bruit Cardiac: RRR 2/6 SEM loudest at RUSB but also heard LSB, no rubs or gallops, no edema  Respiratory:  clear to auscultation bilaterally, normal work of breathing GI: soft, nontender, nondistended, + BS MS: no deformity or atrophy Skin: warm and dry, no rash Neuro:  Alert and Oriented x 3, Strength and sensation are intact, follows commands, mildly decreased grip strength bilaterally, no acute neuro changes Psych: euthymic mood, full affect     Wt Readings from Last 3 Encounters:  05/08/18 165 lb 12.8 oz (75.2 kg)  04/24/18 167 lb 8 oz (76 kg)  04/16/18 166 lb (75.3 kg)      Studies/Labs Reviewed:   EKG:  EKG was ordered today and personally reviewed by me and demonstrates NSR 70bpm, nonspecific ST-T Changes.  Recent Labs: 03/19/2018: Hemoglobin 14.4; Platelets 278 04/11/2018: ALT 75; BUN 17; Creatinine, Ser 0.77; Potassium 5.1; Sodium 140; TSH 4.87   Lipid Panel Labs(Brief)          Component Value Date/Time   CHOL 276 (H) 03/19/2018  1643   TRIG 217 (H) 03/19/2018 1643   HDL 43 03/19/2018 1643   CHOLHDL 6.4 (H) 03/19/2018  1643   CHOLHDL 3 09/13/2017 0817   VLDL 22.8 09/13/2017 0817   LDLCALC 190 (H) 03/19/2018 1643   LDLDIRECT 209 (H) 03/19/2018 1643   LDLDIRECT 170.0 12/11/2016 0943      Additional studies/ records that were reviewed today include: Summarized above.   ASSESSMENT & PLAN:   1. Abnormal cardiac CT scan/Left arm pain - mixed atypical/typical features. Her symptoms have progressed, with some exertional component. Interesting she said her symptoms improved significantly when administered Joanna NTG from her cardiac CT Scan. She is tearful and indicates she feels poorly enough to warrant admission to [facility] hospital. Joanna left arm heaviness has continued to wax and wane with increased intensity. I reviewed with Dr. Caryl Comes. Cardiac CT reader recommended cardiac cath based on study result. Although symptoms are somewhat atypical I do feel this is warranted. We will admit and cycle troponins. If positive, will need IV heparin per pharmacy. Continue aspirin. Begin carvedilol for anti-anginal therapy and blood pressure (prescribed earlier today by phone note). Plan cath tomorrow with Dr. Saunders Revel. Risks and benefits of cardiac catheterization have been discussed with Joanna [name].  These include bleeding, infection, kidney damage, stroke, heart attack, death.  Joanna [name] understands these risks and is willing to proceed. Given atypical L arm symptoms Dr. Caryl Comes also suggests undertaking a CT scan of Joanna head this evening to exclude any unusual progression from a neurologic standpoint. If her cath suggests disease to be treated medically, consider addition of long-acting nitrate. 2. HTN - poorly controlled despite recent med titration. Add carvedilol. 3. Hyperlipidemia - discussed again with [name] in clinic. Her insurance previously did not cover PCSK9 years ago, but I suspect they would now with recent formulary  changes. Suggest referral to lipid clinic upon discharge. 4. Moderate aortic stenosis - continue to follow clinically for now.  Disposition: F/u was tentatively made for 2 weeks; keep this appt post-discharge.  Medication Adjustments/Labs and Tests Ordered: Current medicines are reviewed at length with Joanna [name] today.  Concerns regarding medicines are outlined above. Medication changes, Labs and Tests ordered today are summarized above and listed in Joanna [name] Instructions accessible in Encounters.   Signed, Charlie Pitter, PA-C  05/08/2018 3:17 PM    [facility] [place], [place], [place]  [address] Phone: [phone]; Fax: [phone]   Pt interviewed seen and examined Unusual panoply of symptoms, but CT A FFR is immediately concerning. Have recommended hospitalization because of rest pain and catheterization in AM

## 2018-05-08 NOTE — Progress Notes (Signed)
Cardiology Office Note    Date:  05/08/2018  ID:  [name], DOB Oct 09, 1950, MRN [id number] PCP:  [name], MD  Cardiologist:  Candee Furbish, MD   Chief Complaint: abnormal cardiac CT scan  History of Present Illness:  Joanna [name] is a 67 y.o. 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  To recap, she had acute right thalamic infarct in 04/2016 with CTA head/neck showing small left vertebral originated directly from arch, with high grade ostial stenosis but likely no significance with regard to right thalamic infarct, no intracranial or extracranial stenosis. She was followed by neurology who felt this was due to small vessel disease. She was started on daily aspirin. She did not receive IV t-PA due to delay in arrival. 2D echo at that time showed mild LVH, grade 1 DD, normal LVEF, trivial tricuspid regurgitation. She has had intolerances to Crestor, Lipitor, Niacin, Zocor, Pravastatin. She believes that Lipitor contributed to her development of diabetes. She also recalled history of bladder bleeding, hot flashes and flu-like symptoms which she attributed to statin. She has had nausea/vomiting with Crestor, nausea/vomiting with niacin, myalgias with pravastatin, and GI upset with Zocor and Zetia. She has declined PCSK9 due to cost concerns, but has been agreeable to retrial of Crestor. She initially had marked improvement in LDL from 192 to 70 in 08/2017, however, she stopped this shortly after due to anxiety and stomach problems. She even tried to stretch it out or have husband hide in pillbox in case of placebo effect but the medicine definitely exacerbated those symptoms. She has since resumed at 1/4 tablet daily.  She was seen in clinic 03/19/18 with persistent left arm discomfort which was present ever since her prior stroke. However, it  has become more frequent and longstanding. It could occur for days at at time. She said neurology had reported this was to be expected but it has become more noticeable. It is not so much a weakness, but rather a discomfort/heaviness. She had also recently noticed some association with exertion and indicated family had pointed out she appeared more SOB with exertion. A more prominent murmur was noted. She's also had some discrepancy in BP between R/L arms. 2D echo 03/25/18 showed EF 65-70%, grade 1 DD, moderate AS, mild LAE. Carotid duplex 03/22/18 showed 7-98% stenosis in LICA otherwise normal, including subclavians. BP meds have been titrated to address symptoms and HTN. Cardiac CT was undertaken. There was considerable difficulty getting this pre-certed through her insurance. It was performed 05/03/18 but listed under Dr. Francesca Oman name so had not come to my box. It is unclear what happened with result. Patient reached out via [facility] today to inquire about result. This showed diffuse CAD with 50-69% stenosis in the ostial RCA, 50-69% stenosis in the mid LAD and in a proximal portion of a small 1. diagonal artery, study affected by motion. FFR analysis showed significant stenosis in the mid LAD. She does note that her overall discomfort seemed to improve significantly when administered nitroglycerin for the study. Last labs 03/2018 showed K 5.1, Cr 0.77, AST 46/ALT 75, A1C 6.2, 02/2018 LDL 190, trig 217.  She returns for follow-up today to discuss above result. She is tearful because she feels the left arm discomfort is getting worse. She also has other somatic complaints unrelated to above situation (chronic ankle pain, intermittent abdominal cramps)  which are generally unchanged. She is tearful at how bad she has been feeling lately. Recheck BP by me in R arm was 150/90.  Past Medical History:  Diagnosis Date  . Abnormal liver function tests   . Adenomatous colon polyp   . Cerebrovascular disease    a. CT  04/2016 -calcific plaque at the origin LEFT vertebral contributing to severe stenosis.  . Diabetes mellitus    type II--ORAL MEDICATION - NO INSULIN  . Diverticulitis of colon   . Fatty liver    a. mild fatty liver infiltration by CT 2014.  Marland Kitchen Foot injury 08   Left foot/leg with ?? torn muscle -PROBLEM HAS RESOLVED  . Hand pain 1/09   steroid injections--RESOLVED  . Hx of renal calculi    LEFT  . Hyperlipidemia   . Hypertension   . Hypothyroidism    pt states hx of thyroid nodules  . IBS (irritable bowel syndrome)   . Internal hemorrhoids   . Sleep apnea    USES CPAP - SETTING IS 14  . Statin intolerance   . Statin intolerance   . Stroke [facility])    a. right thalamic infarct in 04/2016 felt by neuro to be due to small vessel disease.    Past Surgical History:  Procedure Laterality Date  . COLONOSCOPY W/ BIOPSIES AND POLYPECTOMY  05/19/2005   adenomatous polyps  . NEPHROLITHOTOMY Left 01/27/2013   Procedure: NEPHROLITHOTOMY PERCUTANEOUS;  Surgeon: Dutch Gray, MD;  Location: [facility];  Service: Urology;  Laterality: Left;  . TUBAL LIGATION    . WISDOM TEETH EXTRACTED      Current Medications: Current Meds  Medication Sig  . amLODipine (NORVASC) 10 MG tablet Take 1 tablet (10 mg total) by mouth daily.  Marland Kitchen aspirin EC 81 MG tablet Take 1 tablet (81 mg total) by mouth daily.  . carvedilol (COREG) 3.125 MG tablet Take 1 tablet (3.125 mg total) by mouth 2 (two) times daily.  . cholecalciferol (VITAMIN D) 1000 units tablet Take 2,000 Units by mouth daily.  . Dulaglutide (TRULICITY) 1.5 KD/3.2IZ SOPN INJECT CONTENTS OF PEN WEEKLY  . glimepiride (AMARYL) 1 MG tablet TAKE 1 TABLET (1 MG TOTAL) BY MOUTH AT BEDTIME.  Marland Kitchen glucose blood (ONE TOUCH ULTRA TEST) test strip USE TO CHECK BLOOD SUGAR TWICE DAILY  Dx:E11.65  . hydrocortisone (ANUSOL-HC) 2.5 % rectal cream Use small amount in the perianal area 3 times daily as needed.  Marland Kitchen levothyroxine (SYNTHROID) 112 MCG tablet Take 1 tablet (112 mcg  total) by mouth daily before breakfast.  . Magnesium 250 MG TABS Take 1 tablet by mouth 2 (two) times daily.  . metFORMIN (GLUCOPHAGE) 1000 MG tablet TAKE 1 TABLET (1,000 MG TOTAL) BY MOUTH 2 (TWO) TIMES DAILY.  . metoprolol tartrate (LOPRESSOR) 100 MG tablet Take 1 tablet 2 hours before your ct scan  . ramipril (ALTACE) 10 MG capsule Take 1 capsule (10 mg total) by mouth daily.  . rosuvastatin (CRESTOR) 5 MG tablet Take 1.25 mg by mouth daily.  . [DISCONTINUED] rosuvastatin (CRESTOR) 5 MG tablet Take 2.5 mg by mouth daily.      Allergies:   Crestor [rosuvastatin]; Lipitor [atorvastatin]; Pravastatin sodium; Sulfonamide derivatives; Zetia [ezetimibe]; Zocor [simvastatin]; and Niacin   Social History   Socioeconomic History  . Marital status: Married    Spouse name: Not on file  . Number of children: 3  . Years of education: Not on file  . Highest education level: Not on file  Occupational History  .  Occupation: Producer, television/film/video: [employer]    Comment: sits most of day   Social Needs  . Financial resource strain: Not on file  . Food insecurity:    Worry: Not on file    Inability: Not on file  . Transportation needs:    Medical: Not on file    Non-medical: Not on file  Tobacco Use  . Smoking status: Never Smoker  . Smokeless tobacco: Never Used  Substance and Sexual Activity  . Alcohol use: No  . Drug use: No  . Sexual activity: Not on file  Lifestyle  . Physical activity:    Days per week: Not on file    Minutes per session: Not on file  . Stress: Not on file  Relationships  . Social connections:    Talks on phone: Not on file    Gets together: Not on file    Attends religious service: Not on file    Active member of club or organization: Not on file    Attends meetings of clubs or organizations: Not on file    Relationship status: Not on file  Other Topics Concern  . Not on file  Social History Narrative   Married, one son to daughters. She does office  work. One caffeinated drink daily.   Updated as of 04/30/2013     Family History:  The patient's family history includes Hyperlipidemia in her brother, mother, and sister; Neurofibromatosis in her father; Thyroid disease in her daughter. There is no history of Diabetes, Stroke, Colon cancer, Esophageal cancer, Pancreatic cancer, or Liver cancer.  ROS:   Please see the history of present illness. All other systems are reviewed and otherwise negative.    PHYSICAL EXAM:   VS:  BP (!) 180/90 (BP Location: Right Arm)   Pulse 70   Ht 5\' 4"  (1.626 m)   Wt 165 lb 12.8 oz (75.2 kg)   SpO2 98%   BMI 28.46 kg/m   BMI: Body mass index is 28.46 kg/m. GEN: Well nourished, well developed WF, in no acute distress HEENT: normocephalic, atraumatic Neck: no JVD, carotid bruits, or masses Cardiac: RRR 2/6 SEM, no rubs or gallops, no edema  Respiratory:  clear to auscultation bilaterally, normal work of breathing GI: soft, nontender, nondistended, + BS MS: no deformity or atrophy Skin: warm and dry, no rash Neuro:  Alert and Oriented x 3, Strength and sensation are intact, follows commands Psych: euthymic mood, full affect  Wt Readings from Last 3 Encounters:  05/08/18 165 lb 12.8 oz (75.2 kg)  04/24/18 167 lb 8 oz (76 kg)  04/16/18 166 lb (75.3 kg)      Studies/Labs Reviewed:   EKG:  EKG was ordered today and personally reviewed by me and demonstrates NSR 70bpm, nonspecific ST-T Changes.  Recent Labs: 03/19/2018: Hemoglobin 14.4; Platelets 278 04/11/2018: ALT 75; BUN 17; Creatinine, Ser 0.77; Potassium 5.1; Sodium 140; TSH 4.87   Lipid Panel    Component Value Date/Time   CHOL 276 (H) 03/19/2018 1643   TRIG 217 (H) 03/19/2018 1643   HDL 43 03/19/2018 1643   CHOLHDL 6.4 (H) 03/19/2018 1643   CHOLHDL 3 09/13/2017 0817   VLDL 22.8 09/13/2017 0817   LDLCALC 190 (H) 03/19/2018 1643   LDLDIRECT 209 (H) 03/19/2018 1643   LDLDIRECT 170.0 12/11/2016 0943    Additional studies/ records  that were reviewed today include: Summarized above.   ASSESSMENT & PLAN:   1. Abnormal cardiac CT scan/Left arm pain -  mixed atypical/typical features. Her symptoms have progressed, with some exertional component. Interesting she said her symptoms improved significantly when administered the NTG from her cardiac CT Scan. She is tearful and indicates she feels poorly enough to warrant admission to the hospital. The left arm heaviness has continued to wax and wane with increased intensity. I reviewed with Dr. Caryl Comes. Cardiac CT reader recommended cardiac cath based on study result. Although symptoms are somewhat atypical I do feel this is warranted. We will admit and cycle troponins. If positive, will need IV heparin per pharmacy. Continue aspirin. Begin carvedilol for anti-anginal therapy and blood pressure (prescribed earlier today by phone note). Plan cath tomorrow with Dr. Saunders Revel. Risks and benefits of cardiac catheterization have been discussed with the patient.  These include bleeding, infection, kidney damage, stroke, heart attack, death.  The patient understands these risks and is willing to proceed. Given atypical L arm symptoms Dr. Caryl Comes also suggests undertaking a CT scan of the head this evening to exclude any unusual progression from a neurologic standpoint. If her cath suggests disease to be treated medically, consider addition of long-acting nitrate. 2. HTN - poorly controlled despite recent med titration. Add carvedilol. 3. Hyperlipidemia - discussed again with patient in clinic. Her insurance previously did not cover PCSK9 years ago, but I suspect they would now with recent formulary changes. Suggest referral to lipid clinic upon discharge. 4. Moderate aortic stenosis - continue to follow clinically for now.  Disposition: F/u was tentatively made for 2 weeks; keep this appt post-discharge.  Medication Adjustments/Labs and Tests Ordered: Current medicines are reviewed at length with the  patient today.  Concerns regarding medicines are outlined above. Medication changes, Labs and Tests ordered today are summarized above and listed in the Patient Instructions accessible in Encounters.   Signed, Charlie Pitter, PA-C  05/08/2018 3:17 PM    [facility] [address], [place], [place]  [address] Phone: [phone]; Fax: [phone]

## 2018-05-09 ENCOUNTER — Encounter (HOSPITAL_COMMUNITY)
Admission: AD | Disposition: A | Discharge status: Home / Self Care | Payer: Self-pay | Source: Ambulatory Visit | Attending: Internal Medicine

## 2018-05-09 DIAGNOSIS — F419 Anxiety disorder, unspecified: Secondary | ICD-10-CM | POA: Diagnosis present

## 2018-05-09 DIAGNOSIS — R931 Abnormal findings on diagnostic imaging of heart and coronary circulation: Secondary | ICD-10-CM | POA: Diagnosis not present

## 2018-05-09 DIAGNOSIS — Z882 Allergy status to sulfonamides status: Secondary | ICD-10-CM | POA: Diagnosis not present

## 2018-05-09 DIAGNOSIS — I200 Unstable angina: Secondary | ICD-10-CM

## 2018-05-09 DIAGNOSIS — I440 Atrioventricular block, first degree: Secondary | ICD-10-CM | POA: Diagnosis present

## 2018-05-09 DIAGNOSIS — Z8349 Family history of other endocrine, nutritional and metabolic diseases: Secondary | ICD-10-CM | POA: Diagnosis not present

## 2018-05-09 DIAGNOSIS — E119 Type 2 diabetes mellitus without complications: Secondary | ICD-10-CM | POA: Diagnosis present

## 2018-05-09 DIAGNOSIS — Z79899 Other long term (current) drug therapy: Secondary | ICD-10-CM | POA: Diagnosis not present

## 2018-05-09 DIAGNOSIS — Z955 Presence of coronary angioplasty implant and graft: Secondary | ICD-10-CM | POA: Diagnosis not present

## 2018-05-09 DIAGNOSIS — I10 Essential (primary) hypertension: Secondary | ICD-10-CM | POA: Diagnosis present

## 2018-05-09 DIAGNOSIS — Z87442 Personal history of urinary calculi: Secondary | ICD-10-CM | POA: Diagnosis not present

## 2018-05-09 DIAGNOSIS — G8929 Other chronic pain: Secondary | ICD-10-CM | POA: Diagnosis present

## 2018-05-09 DIAGNOSIS — Z888 Allergy status to other drugs, medicaments and biological substances status: Secondary | ICD-10-CM | POA: Diagnosis not present

## 2018-05-09 DIAGNOSIS — E039 Hypothyroidism, unspecified: Secondary | ICD-10-CM | POA: Diagnosis present

## 2018-05-09 DIAGNOSIS — I350 Nonrheumatic aortic (valve) stenosis: Secondary | ICD-10-CM

## 2018-05-09 DIAGNOSIS — Z95820 Peripheral vascular angioplasty status with implants and grafts: Secondary | ICD-10-CM | POA: Diagnosis not present

## 2018-05-09 DIAGNOSIS — Z86010 Personal history of colonic polyps: Secondary | ICD-10-CM | POA: Diagnosis not present

## 2018-05-09 DIAGNOSIS — Z9851 Tubal ligation status: Secondary | ICD-10-CM | POA: Diagnosis not present

## 2018-05-09 DIAGNOSIS — Z7984 Long term (current) use of oral hypoglycemic drugs: Secondary | ICD-10-CM | POA: Diagnosis not present

## 2018-05-09 DIAGNOSIS — M25572 Pain in left ankle and joints of left foot: Secondary | ICD-10-CM | POA: Diagnosis present

## 2018-05-09 DIAGNOSIS — I25110 Atherosclerotic heart disease of native coronary artery with unstable angina pectoris: Secondary | ICD-10-CM | POA: Diagnosis present

## 2018-05-09 DIAGNOSIS — I2510 Atherosclerotic heart disease of native coronary artery without angina pectoris: Secondary | ICD-10-CM

## 2018-05-09 DIAGNOSIS — K760 Fatty (change of) liver, not elsewhere classified: Secondary | ICD-10-CM | POA: Diagnosis present

## 2018-05-09 DIAGNOSIS — Z8673 Personal history of transient ischemic attack (TIA), and cerebral infarction without residual deficits: Secondary | ICD-10-CM | POA: Diagnosis not present

## 2018-05-09 DIAGNOSIS — Z79890 Hormone replacement therapy (postmenopausal): Secondary | ICD-10-CM | POA: Diagnosis not present

## 2018-05-09 DIAGNOSIS — Z8279 Family history of other congenital malformations, deformations and chromosomal abnormalities: Secondary | ICD-10-CM | POA: Diagnosis not present

## 2018-05-09 DIAGNOSIS — G4733 Obstructive sleep apnea (adult) (pediatric): Secondary | ICD-10-CM | POA: Diagnosis present

## 2018-05-09 DIAGNOSIS — E785 Hyperlipidemia, unspecified: Secondary | ICD-10-CM | POA: Diagnosis present

## 2018-05-09 DIAGNOSIS — Z7982 Long term (current) use of aspirin: Secondary | ICD-10-CM | POA: Diagnosis not present

## 2018-05-09 DIAGNOSIS — I082 Rheumatic disorders of both aortic and tricuspid valves: Secondary | ICD-10-CM | POA: Diagnosis present

## 2018-05-09 DIAGNOSIS — I25119 Atherosclerotic heart disease of native coronary artery with unspecified angina pectoris: Secondary | ICD-10-CM | POA: Diagnosis not present

## 2018-05-09 HISTORY — PX: LEFT HEART CATH AND CORONARY ANGIOGRAPHY: CATH118249

## 2018-05-09 HISTORY — PX: CORONARY STENT INTERVENTION: CATH118234

## 2018-05-09 HISTORY — DX: Nonrheumatic aortic (valve) stenosis: I35.0

## 2018-05-09 HISTORY — DX: Atherosclerotic heart disease of native coronary artery without angina pectoris: I25.10

## 2018-05-09 LAB — GLUCOSE, CAPILLARY
Glucose-Capillary: 109 mg/dL — ABNORMAL HIGH (ref 70–99)
Glucose-Capillary: 96 mg/dL (ref 70–99)
Glucose-Capillary: 121 mg/dL — ABNORMAL HIGH (ref 70–99)
Glucose-Capillary: 106 mg/dL — ABNORMAL HIGH (ref 70–99)

## 2018-05-09 LAB — CBC
HCT: 40.1 % (ref 36.0–46.0)
Hemoglobin: 13.2 g/dL (ref 12.0–15.0)
MCH: 29.2 pg (ref 26.0–34.0)
MCHC: 32.9 g/dL (ref 30.0–36.0)
MCV: 88.7 fL (ref 80.0–100.0)
Platelets: 260 10*3/uL (ref 150–400)
RBC: 4.52 MIL/uL (ref 3.87–5.11)
RDW: 12.7 % (ref 11.5–15.5)
WBC: 6.6 10*3/uL (ref 4.0–10.5)
nRBC: 0 % (ref 0.0–0.2)

## 2018-05-09 LAB — TROPONIN I
Troponin I: <0.03 ng/mL (ref <0.03)
Troponin I: <0.03 ng/mL (ref <0.03)

## 2018-05-09 LAB — LIPID PANEL
Cholesterol: 195 mg/dL (ref 0–200)
HDL: 36 mg/dL — ABNORMAL LOW (ref >40)
LDL Cholesterol: 128 mg/dL — ABNORMAL HIGH (ref 0–99)
Total CHOL/HDL Ratio: 5.4 RATIO
Triglycerides: 153 mg/dL — ABNORMAL HIGH (ref <150)
VLDL: 31 mg/dL (ref 0–40)

## 2018-05-09 LAB — BASIC METABOLIC PANEL
Anion gap: 12 (ref 5–15)
BUN: 12 mg/dL (ref 8–23)
CO2: 24 mmol/L (ref 22–32)
Calcium: 9.1 mg/dL (ref 8.9–10.3)
Chloride: 102 mmol/L (ref 98–111)
Creatinine, Ser: 0.73 mg/dL (ref 0.44–1.00)
GFR calc Af Amer: >60 mL/min (ref >60)
GFR calc non Af Amer: >60 mL/min (ref >60)
Glucose, Bld: 152 mg/dL — ABNORMAL HIGH (ref 70–99)
POTASSIUM: 4 mmol/L (ref 3.5–5.1)
Sodium: 138 mmol/L (ref 135–145)

## 2018-05-09 LAB — POCT ACTIVATED CLOTTING TIME
Activated Clotting Time: 241 seconds
Activated Clotting Time: 246 seconds

## 2018-05-09 LAB — HIV ANTIBODY (ROUTINE TESTING W REFLEX): HIV Screen 4th Generation wRfx: NONREACTIVE

## 2018-05-09 SURGERY — LEFT HEART CATH AND CORONARY ANGIOGRAPHY
Anesthesia: LOCAL

## 2018-05-09 MED ORDER — SODIUM CHLORIDE 0.9 % IV SOLN
INTRAVENOUS | Status: AC
  Administered 2018-05-09: 15:00:00 via INTRAVENOUS

## 2018-05-09 MED ORDER — TICAGRELOR 90 MG PO TABS
90.0000 mg | ORAL_TABLET | Freq: Two times a day (BID) | ORAL | Status: DC
  Administered 2018-05-10 (×2): 90 mg via ORAL
  Filled 2018-05-09 (×2): qty 1

## 2018-05-09 MED ORDER — HYDRALAZINE HCL 20 MG/ML IJ SOLN: 5.0000 mg | INTRAMUSCULAR | Status: AC | PRN

## 2018-05-09 MED ORDER — ANGIOPLASTY BOOK
Freq: Once | Status: AC
  Administered 2018-05-09: 21:00:00
  Filled 2018-05-09: qty 1

## 2018-05-09 MED ORDER — VERAPAMIL HCL 2.5 MG/ML IV SOLN
INTRAVENOUS | Status: AC
  Filled 2018-05-09: qty 2

## 2018-05-09 MED ORDER — SODIUM CHLORIDE 0.9% FLUSH: 3.0000 mL | Freq: Two times a day (BID) | INTRAVENOUS | Status: DC

## 2018-05-09 MED ORDER — HEPARIN SODIUM (PORCINE) 1000 UNIT/ML IJ SOLN
INTRAMUSCULAR | Status: DC | PRN
  Administered 2018-05-09 (×2): 2000 [IU] via INTRAVENOUS
  Administered 2018-05-09 (×2): 4000 [IU] via INTRAVENOUS

## 2018-05-09 MED ORDER — MIDAZOLAM HCL 2 MG/2ML IJ SOLN
INTRAMUSCULAR | Status: DC | PRN
  Administered 2018-05-09 (×2): 1 mg via INTRAVENOUS

## 2018-05-09 MED ORDER — NITROGLYCERIN 1 MG/10 ML FOR IR/CATH LAB
INTRA_ARTERIAL | Status: DC | PRN
  Administered 2018-05-09 (×2): 200 ug via INTRACORONARY

## 2018-05-09 MED ORDER — HEPARIN (PORCINE) IN NACL 1000-0.9 UT/500ML-% IV SOLN
INTRAVENOUS | Status: AC
  Filled 2018-05-09: qty 1000

## 2018-05-09 MED ORDER — TICAGRELOR 90 MG PO TABS
ORAL_TABLET | ORAL | Status: DC | PRN
  Administered 2018-05-09: 180 mg via ORAL

## 2018-05-09 MED ORDER — SODIUM CHLORIDE 0.9 % IV SOLN: 250.0000 mL | INTRAVENOUS | Status: DC | PRN

## 2018-05-09 MED ORDER — FENTANYL CITRATE (PF) 100 MCG/2ML IJ SOLN
INTRAMUSCULAR | Status: AC
  Filled 2018-05-09: qty 2

## 2018-05-09 MED ORDER — SODIUM CHLORIDE 0.9% FLUSH: 3.0000 mL | INTRAVENOUS | Status: DC | PRN

## 2018-05-09 MED ORDER — IOHEXOL 350 MG/ML SOLN
INTRAVENOUS | Status: DC | PRN
  Administered 2018-05-09: 110 mL

## 2018-05-09 MED ORDER — FENTANYL CITRATE (PF) 100 MCG/2ML IJ SOLN
INTRAMUSCULAR | Status: DC | PRN
  Administered 2018-05-09 (×2): 25 ug via INTRAVENOUS

## 2018-05-09 MED ORDER — LABETALOL HCL 5 MG/ML IV SOLN: 10.0000 mg | INTRAVENOUS | Status: AC | PRN

## 2018-05-09 MED ORDER — VERAPAMIL HCL 2.5 MG/ML IV SOLN
INTRAVENOUS | Status: DC | PRN
  Administered 2018-05-09: 10 mL via INTRA_ARTERIAL

## 2018-05-09 MED ORDER — TICAGRELOR 90 MG PO TABS
ORAL_TABLET | ORAL | Status: AC
  Filled 2018-05-09: qty 2

## 2018-05-09 MED ORDER — HEPARIN SODIUM (PORCINE) 1000 UNIT/ML IJ SOLN
INTRAMUSCULAR | Status: AC
  Filled 2018-05-09: qty 1

## 2018-05-09 MED ORDER — ENOXAPARIN SODIUM 40 MG/0.4ML ~~LOC~~ SOLN
40.0000 mg | SUBCUTANEOUS | Status: DC
  Administered 2018-05-10: 08:00:00 40 mg via SUBCUTANEOUS
  Filled 2018-05-09: qty 0.4

## 2018-05-09 MED ORDER — NITROGLYCERIN 1 MG/10 ML FOR IR/CATH LAB
INTRA_ARTERIAL | Status: AC
  Filled 2018-05-09: qty 10

## 2018-05-09 MED ORDER — HEPARIN (PORCINE) IN NACL 1000-0.9 UT/500ML-% IV SOLN
INTRAVENOUS | Status: DC | PRN
  Administered 2018-05-09 (×2): 500 mL

## 2018-05-09 MED ORDER — LIDOCAINE HCL (PF) 1 % IJ SOLN
INTRAMUSCULAR | Status: AC
  Filled 2018-05-09: qty 30

## 2018-05-09 MED ORDER — MIDAZOLAM HCL 2 MG/2ML IJ SOLN
INTRAMUSCULAR | Status: AC
  Filled 2018-05-09: qty 2

## 2018-05-09 MED ORDER — LIDOCAINE HCL (PF) 1 % IJ SOLN
INTRAMUSCULAR | Status: DC | PRN
  Administered 2018-05-09: 2 mL

## 2018-05-09 SURGICAL SUPPLY — 18 items
BALLN SAPPHIRE 2.0X10 (BALLOONS) ×2
BALLN SAPPHIRE ~~LOC~~ 2.75X8 (BALLOONS) ×1 IMPLANT
BALLOON SAPPHIRE 2.0X10 (BALLOONS) IMPLANT
CATH 5FR JL3.5 JR4 ANG PIG MP (CATHETERS) ×1 IMPLANT
CATH VISTA GUIDE 6FR XBLAD3.5 (CATHETERS) ×1 IMPLANT
DEVICE RAD COMP TR BAND LRG (VASCULAR PRODUCTS) ×1 IMPLANT
GLIDESHEATH SLEND SS 6F .021 (SHEATH) ×1 IMPLANT
GUIDEWIRE INQWIRE 1.5J.035X260 (WIRE) IMPLANT
INQWIRE 1.5J .035X260CM (WIRE) ×2
KIT ENCORE 26 ADVANTAGE (KITS) ×1 IMPLANT
KIT HEART LEFT (KITS) ×2 IMPLANT
PACK CARDIAC CATHETERIZATION (CUSTOM PROCEDURE TRAY) ×2 IMPLANT
STENT ORSIRO 2.5X13 (Permanent Stent) ×1 IMPLANT
TRANSDUCER W/STOPCOCK (MISCELLANEOUS) ×2 IMPLANT
TUBING CIL FLEX 10 FLL-RA (TUBING) ×2 IMPLANT
WIRE EMERALD ST .035X150CM (WIRE) ×1 IMPLANT
WIRE HI TORQ VERSACORE-J 145CM (WIRE) ×1 IMPLANT
WIRE MINAMO 190 (WIRE) ×1 IMPLANT

## 2018-05-09 NOTE — Brief Op Note (Signed)
BRIEF CARDIAC CATHETERIZATION NOTE  DATE: 05/09/2018 TIME: 2:17 PM  PATIENT:  Joanna [name]  67 y.o. female  PRE-OPERATIVE DIAGNOSIS:  Unstable angina and abnormal cardiac CTA  POST-OPERATIVE DIAGNOSIS:  Severe single-vessel coronary artery disease  PROCEDURE:  Procedure(s): LEFT HEART CATH AND CORONARY ANGIOGRAPHY (N/A) CORONARY STENT INTERVENTION (N/A)  SURGEON:  Surgeon(s) and Role:    * Anzlee Hinesley, Harrell Gave, MD - Primary  FINDINGS: 1. Severe single-vessel CAD with 90% mid/distal LAD stenosis.  There are additional mild to moderate stenoses involving the mid and distal LAD. 2. Mild, non-obstructive RCA disease. 3. Low left ventricular filling pressure. 4. Moderate aortic valve stenosis. 5. Successful PCI to 90% mid/distal LAD stenosis using Orsiro 2.5 x 13 mm DES with 0% residual stenosis and TIMI-3 flow.  RECOMMENDATIONS: 1. DAPT with ASA and ticagrelor for up to 12 months. 2. Aggressive secondary prevention.  Nelva Bush, MD [facility] Pager: [phone]

## 2018-05-09 NOTE — Progress Notes (Addendum)
Progress Note  Patient Name: Joanna [name] Date of Encounter: 05/09/2018  Primary Cardiologist: Candee Furbish, MD   Subjective   Ms. Boltz denies any chest pain or shortness of breath.  She continues to have left arm heaviness that seems to worsen at times of anxiety.  She and her family state that they understand the catheterization procedure and the patient says she is ready.  Inpatient Medications    Scheduled Meds: . amLODipine  10 mg Oral Daily  . aspirin EC  81 mg Oral Daily  . carvedilol  3.125 mg Oral BID  . cholecalciferol  2,000 Units Oral Daily  . insulin aspart  0-9 Units Subcutaneous TID WC  . levothyroxine  112 mcg Oral QAC breakfast  . magnesium oxide  200 mg Oral Daily  . ramipril  10 mg Oral Daily  . rosuvastatin  5 mg Oral Daily  . sodium chloride flush  3 mL Intravenous Q12H  . sodium chloride flush  3 mL Intravenous Q12H   Continuous Infusions: . sodium chloride    . sodium chloride    . sodium chloride 1 mL/kg/hr (05/09/18 0717)   PRN Meds: sodium chloride, sodium chloride, acetaminophen, hydrocortisone, nitroGLYCERIN, ondansetron (ZOFRAN) IV, sodium chloride flush, sodium chloride flush   Vital Signs    Vitals:   05/08/18 1749 05/08/18 2255 05/09/18 0637 05/09/18 0936  BP:  131/75 118/82 (!) 133/95  Pulse: 72 63 61 74  Resp:   17   Temp:  97.7 F (36.5 C) 97.8 F (36.6 C)   TempSrc:  Oral Oral   SpO2:  97% 98%   Weight:   73.6 kg   Height:        Intake/Output Summary (Last 24 hours) at 05/09/2018 0943 Last data filed at 05/09/2018 0800 Gross per 24 hour  Intake 519.49 ml  Output 2250 ml  Net -1730.51 ml   Filed Weights   05/08/18 1657 05/09/18 0637  Weight: 74.9 kg 73.6 kg    Telemetry    Sinus rhythm in the 60s- Personally Reviewed  ECG    Sinus rhythm with first-degree AV block, PRI 218, 63 bpm- Personally Reviewed  Physical Exam   GEN: No acute distress.   Neck: No JVD Cardiac: RRR, 2/6 systolic murmur  RUSB Respiratory: Clear to auscultation bilaterally. GI: Soft, nontender, non-distended  MS: No edema; No deformity. Neuro:  Nonfocal  Psych: Normal affect   Labs    Chemistry Recent Labs  Lab 05/08/18 1653 05/09/18 0512  NA 138 138  K 3.9 4.0  CL 97* 102  CO2 26 24  GLUCOSE 95 152*  BUN 13 12  CREATININE 0.71 0.73  CALCIUM 10.4* 9.1  PROT 8.3*  --   ALBUMIN 5.0  --   AST 39  --   ALT 66*  --   ALKPHOS 60  --   BILITOT 1.2  --   GFRNONAA >60 >60  GFRAA >60 >60  ANIONGAP 15 12     Hematology Recent Labs  Lab 05/08/18 1653 05/09/18 0512  WBC 8.0 6.6  RBC 5.17* 4.52  HGB 15.1* 13.2  HCT 45.6 40.1  MCV 88.2 88.7  MCH 29.2 29.2  MCHC 33.1 32.9  RDW 12.7 12.7  PLT 331 260    Cardiac Enzymes Recent Labs  Lab 05/08/18 1653 05/09/18 0013 05/09/18 0512  TROPONINI <0.03 <0.03 <0.03   No results for input(s): TROPIPOC in the last 168 hours.   BNPNo results for input(s): BNP, PROBNP in the last  168 hours.   DDimer No results for input(s): DDIMER in the last 168 hours.   Radiology    Ct Head Wo Contrast  Result Date: 05/08/2018 CLINICAL DATA:  67 y/o  F; left arm heaviness. History of stroke. EXAM: CT HEAD WITHOUT CONTRAST TECHNIQUE: Contiguous axial images were obtained from the base of the skull through the vertex without intravenous contrast. COMPARISON:  05/11/2016 CT head FINDINGS: Brain: No evidence of acute infarction, hemorrhage, hydrocephalus, extra-axial collection or mass lesion/mass effect. Few nonspecific white matter hypodensities are compatible with mild chronic microvascular ischemic changes and there is mild volume loss of the brain. Chronic lacunar infarct within the right thalamus. Vascular: Calcific atherosclerosis of carotid siphons. No hyperdense vessel identified. Skull: Normal. Negative for fracture or focal lesion. Sinuses/Orbits: No acute finding. Other: None. IMPRESSION: 1. No acute intracranial abnormality identified. 2. Mild chronic  microvascular ischemic changes and mild volume loss of the brain. Small chronic lacunar infarct in right thalamus. Electronically Signed   By: Kristine Garbe M.D.   On: 05/08/2018 20:58    Cardiac Studies   Patient is for cardiac catheterization today  Cardiac CTA 05/03/2018 IMPRESSION: 1. Coronary calcium score of 257. This was 29 percentile for age and sex matched control. 2. Normal coronary origin with right dominance. 3. The study is affected by motion. There is a diffuse CAD with 50-69% stenosis in the ostial RCA, 50-69% stenosis in the mid LAD and in a proximal portion of a small 1. diagonal artery. Additional analysis with CT FFR will be submitted. 4. Severe mitral annular calcifications.   Patient Profile     67 y.o. 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  For some dyspnea on exertion and prolonged left arm discomfort she underwent cardiac CTA that indicated moderate RCA and LAD stenosis  Assessment & Plan    1.  Abnormal cardiac CTA/left arm pain -Patient with mixed atypical and typical symptoms.  Fairly longstanding left arm heaviness also some exertional dyspnea. -Cardiac CTA was done as an outpatient which showed some stenosis in the LAD and RCA.  Cardiac catheterization was recommended and is to be performed today. -No chest pain but continues to have left arm heaviness that seems to be worse during periods of anxiety -She continues on aspirin.  Beta-blocker added -CT of the head was done to rule out new changes in the brain to account for her left arm heaviness.  This showed no acute intracranial abnormality, only mild chronic microvascular ischemic changes and mild volume loss as well as small chronic lacunar infarct in the right thalamus which was previously present.  2.  Hypertension -Blood pressure was elevated, carvedilol added with improvement in blood  pressure  3.  Hyperlipidemia -Patient reports intolerance to several statins.  Insurance previously did not cover PCSK9 inhibitor years ago but deserves a reconsideration with recent formulary changes.  Suggest referral to lipid clinic.   4.  Aortic stenosis -Moderate on echo in 03/2018.  Will follow clinically.  5. OSA -Compliant with CPAP  For questions or updates, please contact [place] Please consult [email or web address] for contact info under        Signed, Daune Perch, NP  05/09/2018, 9:43 AM

## 2018-05-09 NOTE — Plan of Care (Signed)
  Problem: Clinical Measurements: Goal: Will remain free from infection Outcome: Progressing Note:  No s/s of infection noted.   Problem: Pain Managment: Goal: General experience of comfort will improve Outcome: Progressing Note:  Denies c/o pain or discomfort.   Problem: Clinical Measurements: Goal: Respiratory complications will improve Note:  No s/s of respiratory complications noted.

## 2018-05-09 NOTE — Interval H&P Note (Signed)
History and Physical Interval Note:  05/09/2018 1:03 PM  Joanna [name]  has presented today for cardiac catheterization, with the diagnosis of unstable angina and abnormal cardiac CTA. The various methods of treatment have been discussed with the patient and family. After consideration of risks, benefits and other options for treatment, the patient has consented to  Procedure(s): LEFT HEART CATH AND CORONARY ANGIOGRAPHY (N/A) as a surgical intervention .  The patient's history has been reviewed, patient examined, no change in status, stable for surgery.  I have reviewed the patient's chart and labs.  Questions were answered to the patient's satisfaction.    Cath Lab Visit (complete for each Cath Lab visit)  Clinical Evaluation Leading to the Procedure:   ACS: Yes.   (unstable angina)  Non-ACS:    Anginal Classification: CCS IV  Anti-ischemic medical therapy: Maximal Therapy (2 or more classes of medications)  Non-Invasive Test Results: Intermediate-risk stress test findings: cardiac mortality 1-3%/year  Prior CABG: No previous CABG  Joanna [name]

## 2018-05-10 ENCOUNTER — Encounter (HOSPITAL_COMMUNITY): Payer: Self-pay | Admitting: Internal Medicine

## 2018-05-10 DIAGNOSIS — I2510 Atherosclerotic heart disease of native coronary artery without angina pectoris: Secondary | ICD-10-CM

## 2018-05-10 DIAGNOSIS — Z95820 Peripheral vascular angioplasty status with implants and grafts: Secondary | ICD-10-CM

## 2018-05-10 LAB — GLUCOSE, CAPILLARY: Glucose-Capillary: 130 mg/dL — ABNORMAL HIGH (ref 70–99)

## 2018-05-10 LAB — CBC
HCT: 40.3 % (ref 36.0–46.0)
Hemoglobin: 13.3 g/dL (ref 12.0–15.0)
MCH: 29.7 pg (ref 26.0–34.0)
MCHC: 33 g/dL (ref 30.0–36.0)
MCV: 90 fL (ref 80.0–100.0)
NRBC: 0 % (ref 0.0–0.2)
Platelets: 288 10*3/uL (ref 150–400)
RBC: 4.48 MIL/uL (ref 3.87–5.11)
RDW: 12.8 % (ref 11.5–15.5)
WBC: 9.9 10*3/uL (ref 4.0–10.5)

## 2018-05-10 LAB — BASIC METABOLIC PANEL
Anion gap: 11 (ref 5–15)
BUN: 12 mg/dL (ref 8–23)
CO2: 22 mmol/L (ref 22–32)
Calcium: 9 mg/dL (ref 8.9–10.3)
Chloride: 105 mmol/L (ref 98–111)
Creatinine, Ser: 0.68 mg/dL (ref 0.44–1.00)
GFR calc non Af Amer: >60 mL/min (ref >60)
Glucose, Bld: 135 mg/dL — ABNORMAL HIGH (ref 70–99)
Potassium: 4 mmol/L (ref 3.5–5.1)
SODIUM: 138 mmol/L (ref 135–145)

## 2018-05-10 MED ORDER — NITROGLYCERIN 0.4 MG SL SUBL: 0.4000 mg | SUBLINGUAL_TABLET | SUBLINGUAL | 12 refills | Status: AC | PRN

## 2018-05-10 MED ORDER — TICAGRELOR 90 MG PO TABS: 90.0000 mg | ORAL_TABLET | Freq: Two times a day (BID) | ORAL | 3 refills | Status: DC

## 2018-05-10 MED ORDER — CARVEDILOL 3.125 MG PO TABS: 3.1250 mg | ORAL_TABLET | Freq: Two times a day (BID) | ORAL | 3 refills | Status: DC

## 2018-05-10 MED FILL — CARVEDILOL 3.125 MG TABLET: 3.125 | 90 days supply | Qty: 180 | Fill #0 | Status: TO

## 2018-05-10 MED FILL — BRILINTA 90 MG TABLET: 90 | 30 days supply | Qty: 60 | Fill #0 | Status: TO

## 2018-05-10 MED FILL — NITROGLYCERIN 0.4 MG TAB SL: 0.4 | 8 days supply | Qty: 25 | Fill #0 | Status: TO

## 2018-05-10 NOTE — Discharge Instructions (Addendum)
Information about your medication: Brilinta (anti-platelet agent)  Generic Name (Brand): ticagrelor (Brilinta), twice daily medication  PURPOSE: You are taking this medication along with aspirin to lower your chance of having a heart attack, stroke, or blood clots in your heart stent. These can be fatal. Brilinta and aspirin help prevent platelets from sticking together and forming a clot that can block an artery or your stent.   Common SIDE EFFECTS you may experience include: bruising or bleeding more easily, shortness of breath  Do not stop taking BRILINTA without talking to the doctor who prescribes it for you. People who are treated with a stent and stop taking Brilinta too soon, have a higher risk of getting a blood clot in the stent, having a heart attack, or dying. If you stop Brilinta because of bleeding, or for other reasons, your risk of a heart attack or stroke may increase.   Tell all of your doctors and dentists that you are taking Brilinta. They should talk to the doctor who prescribed Brilinta for you before you have any surgery or invasive procedure.   Contact your health care provider if you experience: severe or uncontrollable bleeding, pink/red/brown urine, vomiting blood or vomit that looks like "coffee grounds", red or black stools (looks like tar), coughing up blood or blood clots ----------------------------------------------------------------------------------------------------------------------  Coronary Artery Disease, Female  Coronary artery disease (CAD) is a condition in which the arteries that lead to the heart (coronary arteries) become narrow or blocked. The narrowing or blockage can lead to decreased blood flow to the heart. Prolonged reduced blood flow can cause a heart attack (myocardial infarction or MI). This condition may also be called coronary heart disease. Because CAD is the leading cause of death in women, it is important to understand what causes this  condition and how it is treated. What are the causes? CAD is most often caused by atherosclerosis. This is the buildup of fat and cholesterol (plaque) on the inside of the arteries. Over time, the plaque may narrow or block the artery, reducing blood flow to the heart. Plaque can also become weak and break off within a coronary artery and cause a sudden blockage. Other less common causes of CAD include:  An embolism or blood clot in a coronary artery.  A tearing of the artery (spontaneous coronary artery dissection).  An aneurysm.  Inflammation (vasculitis) in the artery wall. What increases the risk? The following factors may make you more likely to develop this condition:  Age. Women over age 93 are at a greater risk of CAD.  Family history of CAD.  High blood pressure (hypertension).  Diabetes.  High cholesterol levels.  Tobacco use.  Lack of exercise.  Menopause. ? All postmenopausal women are at greater risk of CAD. ? Women who have experienced menopause between the ages of 62-45 (early menopause) are at a higher risk of CAD. ? Women who have experienced menopause before age 10 (premature menopause) are at a very high risk of CAD.  Excessive alcohol use  A diet high in saturated and trans fats, such as fried food and processed meat. Other possible risk factors include:  High stress levels.  Depression  Obesity.  Sleep apnea. What are the signs or symptoms? Many people do not have any symptoms during the early stages of CAD. As the condition progresses, symptoms may include:  Chest pain (angina). The pain can: ? Feel like crushing or squeezing, or a tightness, pressure, fullness, or heaviness in the chest. ? Last more  than a few minutes or can stop and recur. The pain tends to get worse with exercise or stress and to fade with rest.  Pain in the arms, neck, jaw, or back.  Unexplained heartburn or indigestion.  Shortness of breath.  Nausea.  Sudden cold  sweats.  Sudden light-headedness.  Fluttering or fast heartbeat (palpitations). Many women have chest discomfort and the other symptoms. However, women often have unusual (atypical) symptoms, such as:  Fatigue.  Vomiting.  Unexplained feelings of nervousness or anxiety.  Unexplained weakness.  Dizziness or fainting. How is this diagnosed? This condition is diagnosed based on:  Your family and medical history.  A physical exam.  Tests, including: ? A test to check the electrical signals in your heart (electrocardiogram). ? Exercise stress test. This looks for signs of blockage when the heart is stressed with exercise, such as running on a treadmill. ? Pharmacologic stress test. This test looks for signs of blockage when the heart is being stressed with a medicine. ? Blood tests. ? Coronary angiogram. This is a procedure to look at the coronary arteries to see if there is any blockage. During this test, a dye is injected into your arteries so they appear on an X-ray. ? A test that uses sound waves to take a picture of your heart (echocardiogram). ? Chest X-ray. How is this treated? This condition may be treated by:  Healthy lifestyle changes to reduce risk factors.  Medicines such as: ? Antiplatelet medicines and blood-thinning medicines, such as aspirin. These help prevent blood clots. ? Nitroglycerin. ? Blood pressure medicines. ? Cholesterol-lowering medicine.  Coronary angioplasty and stenting. During this procedure, a thin, flexible tube is inserted through a blood vessel and into a blocked artery. A balloon or similar device on the end of the tube is inflated to open up the artery. In some cases, a small, mesh tube (stent) is inserted into the artery to keep it open.  Coronary artery bypass surgery. During this surgery, veins or arteries from other parts of the body are used to create a bypass around the blockage and allow blood to reach your heart. Follow these  instructions at home: Medicines  Take over-the-counter and prescription medicines only as told by your health care provider.  Do not take the following medicines unless your health care provider approves: ? NSAIDs, such as ibuprofen, naproxen, or celecoxib. ? Vitamin supplements that contain vitamin A, vitamin E, or both. ? Hormone replacement therapy that contains estrogen with or without progestin. Lifestyle  Follow an exercise program approved by your health care provider. Aim for 150 minutes of moderate exercise or 75 minutes of vigorous exercise each week.  Maintain a healthy weight or lose weight as approved by your health care provider.  Rest when you are tired.  Learn to manage stress or try to limit your stress. Ask your health care provider for suggestions if you need help.  Get screened for depression and seek treatment, if needed.  Do not use any products that contain nicotine or tobacco, such as cigarettes and e-cigarettes. If you need help quitting, ask your health care provider.  Do not use illegal drugs. Eating and drinking  Follow a heart-healthy diet. A dietitian can help educate you about healthy food options and changes. In general, eat plenty of fruits and vegetables, lean meats, and whole grains.  Avoid foods high in: ? Sugar. ? Salt (sodium). ? Saturated fats, such as processed or fatty meat. ? Trans fats, such as fried food.  Use healthy cooking methods such as roasting, grilling, broiling, baking, poaching, steaming, or stir-frying.  If you drink alcohol, and your health care provider approves, limit your alcohol intake to no more than 1 drink per day. One drink equals 12 ounces of beer, 5 ounces of wine, or 1 ounces of hard liquor. General instructions  Manage any other health conditions, such as hypertension and diabetes. These conditions affect your heart.  Your health care provider may ask you to monitor your blood pressure. Ideally, your blood  pressure should be below 130/80.  Keep all follow-up visits as told by your health care provider. This is important. Get help right away if:  You have pain in your chest, neck, arm, jaw, stomach, or back that: ? Lasts more than a few minutes. ? Is recurring. ? Is not relieved by taking medicine under your tongue (sublingualnitroglycerin).  You have profuse sweating without cause.  You have unexplained: ? Heartburn or indigestion. ? Shortness of breath or difficulty breathing. ? Fluttering or fast heartbeat (palpitations). ? Nausea or vomiting. ? Fatigue. ? Feelings of nervousness or anxiety. ? Weakness. ? Diarrhea.  You have sudden light-headedness or dizziness.  You faint.  You feel like hurting yourself or think about taking your own life. These symptoms may represent a serious problem that is an emergency. Do not wait to see if the symptoms will go away. Get medical help right away. Call your local emergency services (911 in the [place] Do not drive yourself to the hospital. Summary  Coronary artery disease (CAD) is a process in which the arteries that lead to the heart (coronary arteries) become narrow or blocked. The narrowing or blockage can lead to a heart attack.  Many women have chest discomfort and other common symptoms of CAD. However, women often have different (atypical) symptoms, such as fatigue, vomiting, and dizziness or weakness.  CAD can be treated with lifestyle changes, medicines, surgery, or a combination of these treatments. This information is not intended to replace advice given to you by your health care provider. Make sure you discuss any questions you have with your health care provider. Document Released: 07/31/2011 Document Revised: 04/28/2016 Document Reviewed: 04/28/2016 Elsevier Interactive Patient Education  2019 [employer]  This sheet gives you information about how to care for yourself after your procedure. Your  health care provider may also give you more specific instructions. If you have problems or questions, contact your health care provider. What can I expect after the procedure? After the procedure, it is common to have:  Bruising and tenderness at the catheter insertion area. Follow these instructions at home: Medicines  Take over-the-counter and prescription medicines only as told by your health care provider. Insertion site care  Follow instructions from your health care provider about how to take care of your insertion site. Make sure you: ? Wash your hands with soap and water before you change your bandage (dressing). If soap and water are not available, use hand sanitizer. ? Change your dressing as told by your health care provider. ? Leave stitches (sutures), skin glue, or adhesive strips in place. These skin closures may need to stay in place for 2 weeks or longer. If adhesive strip edges start to loosen and curl up, you may trim the loose edges. Do not remove adhesive strips completely unless your health care provider tells you to do that.  Check your insertion site every day for signs of infection. Check for: ?  Redness, swelling, or pain. ? Fluid or blood. ? Pus or a bad smell. ? Warmth.  Do not take baths, swim, or use a hot tub until your health care provider approves.  You may shower 24-48 hours after the procedure, or as directed by your health care provider. ? Remove the dressing and gently wash the site with plain soap and water. ? Pat the area dry with a clean towel. ? Do not rub the site. That could cause bleeding.  Do not apply powder or lotion to the site. Activity   For 24 hours after the procedure, or as directed by your health care provider: ? Do not flex or bend the affected arm. ? Do not push or pull heavy objects with the affected arm. ? Do not drive yourself home from the hospital or clinic. You may drive 24 hours after the procedure unless your health  care provider tells you not to. ? Do not operate machinery or power tools.  Do not lift anything that is heavier than 10 lb (4.5 kg), or the limit that you are told, until your health care provider says that it is safe.  Ask your health care provider when it is okay to: ? Return to work or school. ? Resume usual physical activities or sports. ? Resume sexual activity. General instructions  If the catheter site starts to bleed, raise your arm and put firm pressure on the site. If the bleeding does not stop, get help right away. This is a medical emergency.  If you went home on the same day as your procedure, a responsible adult should be with you for the first 24 hours after you arrive home.  Keep all follow-up visits as told by your health care provider. This is important. Contact a health care provider if:  You have a fever.  You have redness, swelling, or yellow drainage around your insertion site. Get help right away if:  You have unusual pain at the radial site.  The catheter insertion area swells very fast.  The insertion area is bleeding, and the bleeding does not stop when you hold steady pressure on the area.  Your arm or hand becomes pale, cool, tingly, or numb. These symptoms may represent a serious problem that is an emergency. Do not wait to see if the symptoms will go away. Get medical help right away. Call your local emergency services (911 in the [place] Do not drive yourself to the hospital. Summary  After the procedure, it is common to have bruising and tenderness at the site.  Follow instructions from your health care provider about how to take care of your radial site wound. Check the wound every day for signs of infection.  Do not lift anything that is heavier than 10 lb (4.5 kg), or the limit that you are told, until your health care provider says that it is safe. This information is not intended to replace advice given to you by your health care provider.  Make sure you discuss any questions you have with your health care provider. Document Released: 06/10/2010 Document Revised: 06/13/2017 Document Reviewed: 06/13/2017 Elsevier Interactive Patient Education  2019 [employer].

## 2018-05-10 NOTE — Discharge Summary (Signed)
Discharge Summary    Patient ID: Joanna [name] MRN: [id number]; DOB: 1950/09/25  Admit date: 05/08/2018 Discharge date: 05/10/2018  Primary Care Provider: [name], MD  Primary Cardiologist: Joanna Furbish, MD  Primary Electrophysiologist:  None   Discharge Diagnoses    Active Problems:   Left arm pain   Unstable angina [facility]   Abnormal cardiac CT angiography   Status post angioplasty with stent   CAD (coronary artery disease)   Allergies Allergies  Allergen Reactions  . Crestor [Rosuvastatin]     REACTION: N/V and heartburn  . Lipitor [Atorvastatin]     REACTION: Hot flashes and flu-like symptoms  . Pravastatin Sodium     REACTION: Neck swelling and pain in her shoulders and arms.  . Sulfonamide Derivatives     Rxn Unknown  . Zetia [Ezetimibe]     GI upset  . Zocor [Simvastatin] Other (See Comments)    REACTION: GI.Marland Kitchen Pt unknown of severity  . Niacin     REACTION: n/v    Diagnostic Studies/Procedures    LEFT HEART CATH AND CORONARY ANGIOGRAPHY    Conclusions: 1. Severe single-vessel CAD with 90% mid/distal LAD stenosis. There are additional mild to moderate stenoses involving the mid and distal LAD. 2. Mild, non-obstructive OM and RCA disease. 3. Low left ventricular filling pressure. 4. Moderate aortic valve stenosis. 5. Successful PCI to 90% mid/distal LAD stenosis using Orsiro 2.5 x 13 mm DES with 0% residual stenosis and TIMI-3 flow.  Recommendations: 1. Dual antiplatelet therapy with aspirin and ticagrelor for up to 12 months. 2. Aggressive secondary prevention. 3. Outpatient follow-up of moderate aortic stenosis.  Joanna Bush, MD   Diagnostic  Dominance: Right    Intervention      _____________   History of Present Illness     Joanna [name] a 67 y.o.femalewith history ofhypothyroidism, DM, HTN, HLD, right thalamic infarct 04/2016, cerebrovascular disease, abnormal LFTs, OSA on CPAP. Joanna [name] had an acute right thalamic infarct  in 04/2016.  She has had persistent left arm discomfort since her stroke.  She was seen in our office on 03/19/2018 with persistent left arm discomfort/heaviness and she was noted to have some exertional dyspnea.  She was noted to have a more prominent murmur.  An echocardiogram on 03/25/2018 showed EF 65-70%, grade 1 DD, moderate AS, mild LAE.  Blood pressure medications were titrated to address her symptoms and hypertension.  A cardiac CT was ordered and showed diffuse CAD with 50-69% stenosis in the ostial RCA, 50-69% stenosis in the mid LAD and in a proximal portion of a small diagonal artery, study affected by motion. FFR analysis showed significant stenosis in the mid LAD. She did note that her overall discomfort seemed to improve significantly when administered nitroglycerin for the study.   Cardiac catheterization was arranged to further evaluate coronary arteries.  Hospital Course     Consultants: none  Joanna [name] underwent cardiac catheterization on 05/09/2018 that revealed severe single-vessel CAD with 90% mid/distal LAD stenosis.  She underwent successful PCI and DES to the LAD.  She was also found to have moderate aortic valve stenosis.  She has been monitored overnight and done well.  Her blood pressure is a little elevated.  We have started carvedilol low dose and can titrate at her follow-up appointment as needed. She has been started on dual antiplatelet therapy with aspirin and ticagrelor for up to 12 months.  The blocker was added  Metformin was held for procedure and can be  resumed 48 hours post procedure.  Hyperlipidemia: LDL is 128. The patient has had prior difficulties with multiple statins.  She is agreeable to try a very low dose of rosuvastatin at 5 mg.  Her insurance denied PCSK9 inhibitors in the past but now she is open to trying this therapy.  We will refer her to the lipid clinic.  Aortic stenosis: Moderate on echo and by cardiac cath with mean gradient 19 mmHg.  Will  follow as an outpatient.  She has walked with cardiac rehab this morning and done well. This morning her left arm heaviness is still present but not as intense.   Labs at discharge: Cr 0.68, K+  Patient has been seen by Joanna [name] today and deemed ready for discharge home. All follow up appointments have been scheduled. Discharge medications are listed below.  _____________  Discharge Vitals Blood pressure (!) 148/86, pulse 72, temperature 97.7 F (36.5 C), temperature source Oral, resp. rate 18, height 5\' 4"  (1.626 m), weight 73.6 kg, SpO2 98 %.  Filed Weights   05/08/18 1657 05/09/18 0637  Weight: 74.9 kg 73.6 kg   Physical Exam  Constitutional: She is oriented to person, place, and time. She appears well-developed and well-nourished. No distress.  HENT:  Head: Normocephalic and atraumatic.  Neck: Normal range of motion. Neck supple. No JVD present.  Cardiovascular: Normal rate, regular rhythm and intact distal pulses. Exam reveals no gallop and no friction rub.  Murmur heard.  Harsh midsystolic murmur is present with a grade of 2/6 at the upper right sternal border and upper left sternal border. Pulmonary/Chest: Effort normal and breath sounds normal. No respiratory distress. She has no wheezes. She has no rales.  Abdominal: Soft. Bowel sounds are normal.  Musculoskeletal: Normal range of motion.        General: No deformity or edema.  Neurological: She is alert and oriented to person, place, and time.  Skin: Skin is warm and dry.  Psychiatric: She has a normal mood and affect. Her behavior is normal. Judgment and thought content normal.    Labs & Radiologic Studies    CBC Recent Labs    05/09/18 0512 05/10/18 0124  WBC 6.6 9.9  HGB 13.2 13.3  HCT 40.1 40.3  MCV 88.7 90.0  PLT 260 619   Basic Metabolic Panel Recent Labs    05/08/18 1653 05/09/18 0512 05/10/18 0124  NA 138 138 138  K 3.9 4.0 4.0  CL 97* 102 105  CO2 26 24 22   GLUCOSE 95 152* 135*  BUN  13 12 12   CREATININE 0.71 0.73 0.68  CALCIUM 10.4* 9.1 9.0  MG 1.9  --   --    Liver Function Tests Recent Labs    05/08/18 1653  AST 39  ALT 66*  ALKPHOS 60  BILITOT 1.2  PROT 8.3*  ALBUMIN 5.0   No results for input(s): LIPASE, AMYLASE in the last 72 hours. Cardiac Enzymes Recent Labs    05/08/18 1653 05/09/18 0013 05/09/18 0512  TROPONINI <0.03 <0.03 <0.03   BNP Invalid input(s): POCBNP D-Dimer No results for input(s): DDIMER in the last 72 hours. Hemoglobin A1C Recent Labs    05/08/18 1653  HGBA1C 5.5   Fasting Lipid Panel Recent Labs    05/09/18 0512  CHOL 195  HDL 36*  LDLCALC 128*  TRIG 153*  CHOLHDL 5.4   Thyroid Function Tests Recent Labs    05/08/18 1653  TSH 1.908   _____________  Ct Head Wo Contrast  Result Date: 05/08/2018 CLINICAL DATA:  67 y/o  F; left arm heaviness. History of stroke. EXAM: CT HEAD WITHOUT CONTRAST TECHNIQUE: Contiguous axial images were obtained from the base of the skull through the vertex without intravenous contrast. COMPARISON:  05/11/2016 CT head FINDINGS: Brain: No evidence of acute infarction, hemorrhage, hydrocephalus, extra-axial collection or mass lesion/mass effect. Few nonspecific white matter hypodensities are compatible with mild chronic microvascular ischemic changes and there is mild volume loss of the brain. Chronic lacunar infarct within the right thalamus. Vascular: Calcific atherosclerosis of carotid siphons. No hyperdense vessel identified. Skull: Normal. Negative for fracture or focal lesion. Sinuses/Orbits: No acute finding. Other: None. IMPRESSION: 1. No acute intracranial abnormality identified. 2. Mild chronic microvascular ischemic changes and mild volume loss of the brain. Small chronic lacunar infarct in right thalamus. Electronically Signed   By: Kristine Garbe M.D.   On: 05/08/2018 20:58   Ct Coronary Morph W/cta Cor W/score W/ca W/cm &/or Wo/cm  Addendum Date: 05/03/2018   ADDENDUM  REPORT: 05/03/2018 18:36 CLINICAL DATA:  67 year old female with chest pain. EXAM: Cardiac/Coronary  CT TECHNIQUE: The patient was scanned on a [employer]. FINDINGS: A 120 kV prospective scan was triggered in the descending thoracic aorta at 111 HU's. Axial non-contrast 3 mm slices were carried out through the heart. The data set was analyzed on a dedicated work station and scored using the [place]. Gantry rotation speed was 250 msecs and collimation was .6 mm. No beta blockade and 0.8 mg of sl NTG was given. The 3D data set was reconstructed in 5% intervals of the 67-82 % of the R-R cycle. Diastolic phases were analyzed on a dedicated work station using MPR, MIP and VRT modes. The patient received 80 cc of contrast. Aorta: Normal size. Moderate diffuse atheroma and calcifications predominantly in the aortic root. No dissection. Aortic Valve: Trileaflet.  Mild thickening and calcifications. Coronary Arteries: Normal coronary origin.  Right dominance. RCA is a large dominant artery that arises high above the right coronary sinus and gives rise to PDA and PLVB. There is a moderate mixed plaque in the ostial RCA with associated stenosis 50-69%. Mid and distal RCA/PDA have only mild diffuse plaque. Left main is a large artery that gives rise to LAD and LCX arteries. Ostial Left main has minimal calcified plaque with stenosis 0-25%. LAD is a medium size vessel that gives rise to two small diagonal arteries. Proximal LAD has minimal plaque. Mild LAD has moderate predominantly non-calcified plaque with stenosis 50-69%. D1 is a small artery and has moderate calcified plaque in the proximal segment with stenosis 50-69%. D2 is a small artery with no significant plaque. LCX is a non-dominant artery that gives rise to one large OM1 branch. There is mild mixed plaque with maximum stenosis 25-50%. IMPRESSION: 1. Coronary calcium score of 257. This was 22 percentile for age and sex matched control. 2. Normal  coronary origin with right dominance. 3. The study is affected by motion. There is a diffuse CAD with 50-69% stenosis in the ostial RCA, 50-69% stenosis in the mid LAD and in a proximal portion of a small 1. diagonal artery. Additional analysis with CT FFR will be submitted. 4. Severe mitral annular calcifications. Electronically Signed   By: Ena Dawley   On: 05/03/2018 18:36   Result Date: 05/03/2018 EXAM: OVER-READ INTERPRETATION  CT CHEST The following report is an over-read performed by radiologist Dr. Aletta Edouard of [facility], [place] on 05/03/2018. This over-read does not include interpretation  of cardiac or coronary anatomy or pathology. The coronary CTA interpretation by the cardiologist is attached. COMPARISON:  None. FINDINGS: Vascular: No incidental findings. Mediastinum/Nodes: Visualized mediastinum and hilar regions show no lymphadenopathy or other focal abnormalities. Lungs/Pleura: Visualized lungs show no evidence of pulmonary edema, consolidation, pneumothorax, nodule or pleural fluid. Upper Abdomen: No acute abnormality. Musculoskeletal: No chest wall mass or suspicious bone lesions identified. IMPRESSION: No incidental findings. Electronically Signed: By: Aletta Edouard M.D. On: 05/03/2018 13:56   Ct Coronary Fractional Flow Reserve Fluid Analysis  Result Date: 05/03/2018 EXAM: CT FFR ANALYSIS FINDINGS: FFRct analysis was performed on the original cardiac CT angiogram dataset. Diagrammatic representation of the FFRct analysis is provided in a separate PDF document in PACS. This dictation was created using the PDF document and an interactive 3D model of the results. 3D model is not available in the EMR/PACS. Normal FFR range is >0.80. 1. Left Main:  No significant stenosis. 2. LAD: Proximal: 0.95.  Distal: 0.56. 3. LCX: No significant stenosis. 4. RCA: No significant stenosis. IMPRESSION: 1. CT FFR analysis showed significant stenosis in the mid LAD. A cardiac catheterization  is recommended. Electronically Signed   By: Ena Dawley   On: 05/03/2018 18:31   Disposition   Pt is being discharged home today in good condition.  Follow-up Plans & Appointments    Follow-up Information    Daune Perch, NP Follow up.   Specialty:  Cardiology Why:  Cardiology hospital follow-up on 05/23/2018 at 2:00 PM.  Please arrive 15 minutes early for check-in. Contact information: [address] [place]  [address] [phone]        Jerline Pain, MD Follow up.   Specialty:  Cardiology Why:  4-month cardiology follow-up on 08/01/2018 at 8:40 AM.  Please arrive 15 minutes early for check-in. Contact information: [address] [place] [address] [phone]          Discharge Instructions    Amb Referral to Cardiac Rehabilitation   Complete by:  As directed    Diagnosis:   Coronary Stents PTCA     Diet - low sodium heart healthy   Complete by:  As directed    Discharge instructions   Complete by:  As directed    PLEASE REMEMBER TO BRING ALL OF YOUR MEDICATIONS TO EACH OF YOUR FOLLOW-UP OFFICE VISITS.  PLEASE ATTEND ALL SCHEDULED FOLLOW-UP APPOINTMENTS.   Activity: Increase activity slowly as tolerated. You may shower, but no soaking baths (or swimming) for 1 week. No driving for 24 hours. No lifting over 5 lbs for 1 week. No sexual activity for 1 week.   You May Return to Work: in 1 week (if applicable)  Wound Care: You may wash cath site gently with soap and water. Keep cath site clean and dry. If you notice pain, swelling, bleeding or pus at your cath site, please call [phone].   Increase activity slowly   Complete by:  As directed       Discharge Medications   Allergies as of 05/10/2018      Reactions   Crestor [rosuvastatin]    REACTION: N/V and heartburn   Lipitor [atorvastatin]    REACTION: Hot flashes and flu-like symptoms   Pravastatin Sodium    REACTION: Neck swelling and pain in her shoulders  and arms.   Sulfonamide Derivatives    Rxn Unknown   Zetia [ezetimibe]    GI upset   Zocor [simvastatin] Other (See Comments)   REACTION:  GI.. Pt unknown of severity   Niacin    REACTION: n/v      Medication List    STOP taking these medications   metoprolol tartrate 100 MG tablet Commonly known as:  LOPRESSOR     TAKE these medications   amLODipine 10 MG tablet Commonly known as:  NORVASC Take 1 tablet (10 mg total) by mouth daily.   aspirin EC 81 MG tablet Take 1 tablet (81 mg total) by mouth daily.   carvedilol 3.125 MG tablet Commonly known as:  COREG Take 1 tablet (3.125 mg total) by mouth 2 (two) times daily.   cholecalciferol 1000 units tablet Commonly known as:  VITAMIN D Take 2,000 Units by mouth daily.   Dulaglutide 1.5 MG/0.5ML Sopn Commonly known as:  TRULICITY INJECT CONTENTS OF PEN WEEKLY What changed:    how much to take  how to take this  when to take this  additional instructions   glimepiride 1 MG tablet Commonly known as:  AMARYL TAKE 1 TABLET (1 MG TOTAL) BY MOUTH AT BEDTIME.   glucose blood test strip Commonly known as:  ONE TOUCH ULTRA TEST USE TO CHECK BLOOD SUGAR TWICE DAILY  Dx:E11.65   hydrocortisone 2.5 % rectal cream Commonly known as:  ANUSOL-HC Use small amount in the perianal area 3 times daily as needed.   levothyroxine 112 MCG tablet Commonly known as:  SYNTHROID Take 1 tablet (112 mcg total) by mouth daily before breakfast.   Magnesium 250 MG Tabs Take 1 tablet by mouth 2 (two) times daily.   metFORMIN 1000 MG tablet Commonly known as:  GLUCOPHAGE TAKE 1 TABLET (1,000 MG TOTAL) BY MOUTH 2 (TWO) TIMES DAILY. What changed:    how much to take  how to take this  when to take this  additional instructions   nitroGLYCERIN 0.4 MG SL tablet Commonly known as:  NITROSTAT Place 1 tablet (0.4 mg total) under the tongue every 5 (five) minutes x 3 doses as needed for chest pain.   ramipril 10 MG  capsule Commonly known as:  ALTACE Take 1 capsule (10 mg total) by mouth daily.   rosuvastatin 5 MG tablet Commonly known as:  CRESTOR Take 1.25 mg by mouth at bedtime.   ticagrelor 90 MG Tabs tablet Commonly known as:  BRILINTA Take 1 tablet (90 mg total) by mouth 2 (two) times daily.        Acute coronary syndrome (MI, NSTEMI, STEMI, etc) this admission?: No.    Outstanding Labs/Studies   None  Duration of Discharge Encounter   Greater than 30 minutes including physician time.  Signed, Daune Perch, NP 05/10/2018, 10:15 AM

## 2018-05-10 NOTE — Progress Notes (Signed)
[facility] BAND REMOVAL  LOCATION:    right radial  DEFLATED PER PROTOCOL:    Yes.    TIME BAND OFF / DRESSING APPLIED:    1958   SITE UPON ARRIVAL:    Level 0  SITE AFTER BAND REMOVAL:    Level 0  CIRCULATION SENSATION AND MOVEMENT:    Within Normal Limits   Yes.    COMMENTS:   Pt.tolerated procedure well

## 2018-05-10 NOTE — Progress Notes (Signed)
Spoke w pharmacist and bedside nurse. [facility] will provide Brilinta and meds at DC. Benefit check pending. No further CM needs identified.

## 2018-05-10 NOTE — Care Management (Signed)
#   1.  ON PHARMACY BENEFITS ON FILE.  S/W ANA @ [facility] [place] # [phone] PATIENT HAS PART-D  BRILINTA 90 MG BID COVER- YES CO- $ 94.34  PREFERRED PHARMACY : YES- [facility]

## 2018-05-10 NOTE — Progress Notes (Signed)
CARDIAC REHAB PHASE I   PRE:  Rate/Rhythm: 75 SR    BP: sitting 164/94 leg    SaO2:   MODE:  Ambulation: 500 ft   POST:  Rate/Rhythm: 102 ST    BP: sitting 166/88 leg, 144/86     SaO2:   Tolerated well, no c/o while walking. Later pt reports that she had some left arm sensation that increased to 7/10 after lying down (reported to MD). Ed completed with pt and daughter Loss adjuster, chartered). Understands importance of Brilinta/ASA. Has been watching diet. Encouraged increasing ex now and pt interested in CRPII. Will refer to [facility]. Discussed NTG as well. [id number]  [place], ACSM 05/10/2018 10:04 AM

## 2018-05-21 ENCOUNTER — Encounter: Payer: Self-pay | Admitting: Cardiology

## 2018-05-23 ENCOUNTER — Ambulatory Visit (INDEPENDENT_AMBULATORY_CARE_PROVIDER_SITE_OTHER): Payer: Medicare Other | Admitting: Cardiology

## 2018-05-23 ENCOUNTER — Encounter: Payer: Self-pay | Admitting: Cardiology

## 2018-05-23 VITALS — BP 128/78 | HR 80 | Ht 64.0 in | Wt 166.8 lb

## 2018-05-23 DIAGNOSIS — I10 Essential (primary) hypertension: Secondary | ICD-10-CM | POA: Diagnosis not present

## 2018-05-23 DIAGNOSIS — I350 Nonrheumatic aortic (valve) stenosis: Secondary | ICD-10-CM | POA: Insufficient documentation

## 2018-05-23 DIAGNOSIS — I2510 Atherosclerotic heart disease of native coronary artery without angina pectoris: Secondary | ICD-10-CM | POA: Diagnosis not present

## 2018-05-23 DIAGNOSIS — E785 Hyperlipidemia, unspecified: Secondary | ICD-10-CM | POA: Diagnosis not present

## 2018-05-23 MED ORDER — CARVEDILOL 6.25 MG PO TABS: 6.2500 mg | ORAL_TABLET | Freq: Two times a day (BID) | ORAL | 3 refills | Status: DC

## 2018-05-23 NOTE — Progress Notes (Signed)
Cardiology Office Note:    Date:  05/23/2018   ID:  Joanna [name], DOB [birth year], MRN [id number]  PCP:  Joanna Passy, MD  Cardiologist:  Joanna Furbish, MD  Referring MD: Joanna Passy, MD   Chief Complaint  Patient presents with  . Hospitalization Follow-up    Post stent    History of Present Illness:    Joanna [name] is a 68 y.o. female with a past medical history significant for history ofhypothyroidism, DM, HTN, HLD, right thalamic infarct 04/2016, cerebrovascular disease, abnormal LFTs, OSA on CPAP.   Joanna [name] Joanna [name] had an acute right thalamic infarct in 04/2016.  She has had persistent left arm discomfort since her stroke.  She was seen in our office on 03/19/2018 with persistent left arm discomfort/heaviness and she was noted to have some exertional dyspnea.  She was noted to have a more prominent murmur.  An echocardiogram on 03/25/2018 showed EF 65-70%, grade 1 DD, moderate AS, mild LAE.  Blood pressure medications were titrated to address her symptoms and hypertension.  A cardiac CT was ordered and showed diffuse CAD with 50-69% stenosis in the ostial RCA, 50-69% stenosis in the mid LAD and in a proximal portion of a small diagonal artery, study affected by motion. FFR analysis showed significant stenosis in the mid LAD.She did note that her overall discomfort seemed to improve significantly when administered nitroglycerin for the study.  Joanna [name]. Joanna [name] underwent cardiac catheterization on 05/09/2018 that revealed severe single-vessel CAD with 90% mid/distal LAD stenosis.  She underwent successful PCI and DES to the LAD.  She was also found to have moderate aortic valve stenosis. Her blood pressure is a little elevated.  We have started carvedilol low dose and can titrate at her follow-up appointment as needed. She has been started on dual antiplatelet therapy with aspirin and ticagrelor for up to 12 months.  The patient has had prior difficulties with multiple statins and she was agreeable to  start a low dose of rosuvastatin at 5 mg.  Her insurance had denied PCSK9 inhibitors in the past but now she is open to trying this therapy.  Joanna [name] Joanna [name] is here today for hospital follow-up with her [name]. She is still having left arm heaviness but it feels different now, only occurs when she is fatigued. She is sleeping much better now and her hands are not hurting at night. No longer having leg cramps or numbness going down her left leg. Her feet are no longer swelling. No chest pain/pressure or shortness of breath. She has started walking, is up to 20 minutes on most days.   She follows heart healthy diet with lots of fish.   Home BPs often in the 150's.    Past Medical History:  Diagnosis Date  . Abnormal liver function tests   . Adenomatous colon polyp   . Aortic stenosis, moderate 05/09/2018  . CAD (coronary artery disease) 05/09/2018   a. LHC 05/09/18: DES to mid/dist LAD, mod AS  . Cerebrovascular disease    a. CT 04/2016 -calcific plaque at the origin LEFT vertebral contributing to severe stenosis.  . Diverticulitis of colon   . Fatty liver    a. mild fatty liver infiltration by CT 2014.  Marland Kitchen Foot injury 2008   Left foot/leg with ?? torn muscle -PROBLEM HAS RESOLVED  . Hand pain 05/2007   steroid injections--RESOLVED  . Heart murmur   . History of kidney stones   . Hx of renal calculi  LEFT  . Hyperlipidemia   . Hypertension   . Hypothyroidism    pt states hx of thyroid nodules  . IBS (irritable bowel syndrome)   . Internal hemorrhoids   . Melanoma ([place]) 1980s   mid upper stomach (05/08/2018)  . OSA on CPAP     SETTING IS 14 (05/08/2018)  . Statin intolerance   . Stroke [facility])    a. right thalamic infarct in 04/2016 felt by neuro to be due to small vessel disease.  . Type II diabetes mellitus ([place])     Past Surgical History:  Procedure Laterality Date  . CATARACT EXTRACTION W/ INTRAOCULAR LENS  IMPLANT, BILATERAL Bilateral 08/2016  . COLONOSCOPY W/ BIOPSIES  AND POLYPECTOMY  05/19/2005   adenomatous polyps  . CORONARY STENT INTERVENTION N/A 05/09/2018   Procedure: CORONARY STENT INTERVENTION;  Surgeon: Nelva Bush, MD;  Location: [place] [facility];  Service: Cardiovascular;  Laterality: N/A;  . LEFT HEART CATH AND CORONARY ANGIOGRAPHY N/A 05/09/2018   Procedure: LEFT HEART CATH AND CORONARY ANGIOGRAPHY;  Surgeon: Nelva Bush, MD;  Location: [place] [facility];  Service: Cardiovascular;  Laterality: N/A;  . MELANOMA EXCISION  1980s   mid upper stomach  . NEPHROLITHOTOMY Left 01/27/2013   Procedure: NEPHROLITHOTOMY PERCUTANEOUS;  Surgeon: Dutch Gray, MD;  Location: [facility];  Service: Urology;  Laterality: Left;  . TUBAL LIGATION    . WISDOM TEETH EXTRACTED      Current Medications: Current Meds  Medication Sig  . amLODipine (NORVASC) 10 MG tablet Take 1 tablet (10 mg total) by mouth daily.  Marland Kitchen aspirin EC 81 MG tablet Take 1 tablet (81 mg total) by mouth daily.  . cholecalciferol (VITAMIN D) 1000 units tablet Take 2,000 Units by mouth daily.  . Dulaglutide (TRULICITY) 1.5 KD/9.8PJ SOPN INJECT CONTENTS OF PEN WEEKLY (Patient taking differently: Inject 1.5 mg into the skin once a week. )  . glimepiride (AMARYL) 1 MG tablet TAKE 1 TABLET (1 MG TOTAL) BY MOUTH AT BEDTIME.  Marland Kitchen glucose blood (ONE TOUCH ULTRA TEST) test strip USE TO CHECK BLOOD SUGAR TWICE DAILY  Dx:E11.65  . hydrocortisone (ANUSOL-HC) 2.5 % rectal cream Use small amount in the perianal area 3 times daily as needed.  Marland Kitchen levothyroxine (SYNTHROID) 112 MCG tablet Take 1 tablet (112 mcg total) by mouth daily before breakfast.  . Magnesium 250 MG TABS Take 1 tablet by mouth 2 (two) times daily.  . metFORMIN (GLUCOPHAGE) 1000 MG tablet TAKE 1 TABLET (1,000 MG TOTAL) BY MOUTH 2 (TWO) TIMES DAILY. (Patient taking differently: Take 1,000 mg by mouth 2 (two) times daily with a meal. )  . nitroGLYCERIN (NITROSTAT) 0.4 MG SL tablet Place 1 tablet (0.4 mg total) under the tongue every 5  (five) minutes x 3 doses as needed for chest pain.  . ramipril (ALTACE) 10 MG capsule Take 1 capsule (10 mg total) by mouth daily.  . rosuvastatin (CRESTOR) 5 MG tablet Take 2.5 mg by mouth daily.  . ticagrelor (BRILINTA) 90 MG TABS tablet Take 1 tablet (90 mg total) by mouth 2 (two) times daily.  . [DISCONTINUED] carvedilol (COREG) 3.125 MG tablet Take 1 tablet (3.125 mg total) by mouth 2 (two) times daily.     Allergies:   Crestor [rosuvastatin]; Lipitor [atorvastatin]; Pravastatin sodium; Sulfonamide derivatives; Zetia [ezetimibe]; Zocor [simvastatin]; and Niacin   Social History   Socioeconomic History  . Marital status: Married    Spouse name: Not on file  . Number of children: 3  . Years of  education: Not on file  . Highest education level: Not on file  Occupational History  . Occupation: Producer, television/film/video: [employer]    Comment: sits most of day   Social Needs  . Financial resource strain: Not on file  . Food insecurity:    Worry: Not on file    Inability: Not on file  . Transportation needs:    Medical: Not on file    Non-medical: Not on file  Tobacco Use  . Smoking status: Never Smoker  . Smokeless tobacco: Never Used  Substance and Sexual Activity  . Alcohol use: Never    Frequency: Never  . Drug use: Never  . Sexual activity: Not Currently  Lifestyle  . Physical activity:    Days per week: Not on file    Minutes per session: Not on file  . Stress: Not on file  Relationships  . Social connections:    Talks on phone: Not on file    Gets together: Not on file    Attends religious service: Not on file    Active member of club or organization: Not on file    Attends meetings of clubs or organizations: Not on file    Relationship status: Not on file  Other Topics Concern  . Not on file  Social History Narrative   Married, one son to daughters. She does office work. One caffeinated drink daily.   Updated as of 04/30/2013     Family History: The  patient's family history includes Hyperlipidemia in her brother, mother, and sister; Neurofibromatosis in her father; Thyroid disease in her daughter. There is no history of Diabetes, Stroke, Colon cancer, Esophageal cancer, Pancreatic cancer, or Liver cancer. ROS:   Please see the history of present illness.     All other systems reviewed and are negative.  EKGs/Labs/Other Studies Reviewed:    The following studies were reviewed today:  LEFT HEART CATH AND CORONARY ANGIOGRAPHY 05/09/18   Conclusions: 1. Severe single-vessel CAD with 90% mid/distal LAD stenosis. There are additional mild to moderate stenoses involving the mid and distal LAD. 2. Mild, non-obstructive OM and RCA disease. 3. Low left ventricular filling pressure. 4. Moderate aortic valve stenosis. 5. Successful PCI to 90% mid/distal LAD stenosis using Orsiro 2.5 x 13 mm DES with 0% residual stenosis and TIMI-3 flow.  Recommendations: 1. Dual antiplatelet therapy with aspirin and ticagrelor for up to 12 months. 2. Aggressive secondary prevention. 3. Outpatient follow-up of moderate aortic stenosis.  Nelva Bush, MD   Diagnostic  Dominance: Right    Intervention        EKG:  EKG is not ordered today.    Recent Labs: 05/08/2018: ALT 66; Magnesium 1.9; TSH 1.908 05/10/2018: BUN 12; Creatinine, Ser 0.68; Hemoglobin 13.3; Platelets 288; Potassium 4.0; Sodium 138   Recent Lipid Panel    Component Value Date/Time   CHOL 195 05/09/2018 0512   CHOL 276 (H) 03/19/2018 1643   TRIG 153 (H) 05/09/2018 0512   HDL 36 (L) 05/09/2018 0512   HDL 43 03/19/2018 1643   CHOLHDL 5.4 05/09/2018 0512   VLDL 31 05/09/2018 0512   LDLCALC 128 (H) 05/09/2018 0512   LDLCALC 190 (H) 03/19/2018 1643   LDLDIRECT 209 (H) 03/19/2018 1643   LDLDIRECT 170.0 12/11/2016 0943    Physical Exam:    VS:  BP 128/78 Comment: 118/66 left arm  Pulse 80   Ht 5\' 4"  (1.626 m)   Wt 166 lb 12.8 oz (75.7 kg)  SpO2 97%   BMI 28.63  kg/m     Wt Readings from Last 3 Encounters:  05/23/18 166 lb 12.8 oz (75.7 kg)  05/09/18 162 lb 3.2 oz (73.6 kg)  05/08/18 165 lb 12.8 oz (75.2 kg)     Physical Exam  Constitutional: She is oriented to person, place, and time. She appears well-developed and well-nourished. No distress.  HENT:  Head: Normocephalic and atraumatic.  Neck: Normal range of motion. Neck supple. No JVD present.  Cardiovascular: Normal rate, regular rhythm and intact distal pulses. Exam reveals no gallop and no friction rub.  Murmur heard.  Harsh midsystolic murmur is present with a grade of 2/6 at the upper right sternal border and upper left sternal border radiating to the neck. Pulmonary/Chest: Effort normal and breath sounds normal. No respiratory distress. She has no wheezes. She has no rales.  Abdominal: Soft. Bowel sounds are normal.  Musculoskeletal: Normal range of motion.        General: No deformity or edema.  Neurological: She is alert and oriented to person, place, and time.  Skin: Skin is warm and dry.  Psychiatric: She has a normal mood and affect. Her behavior is normal. Judgment and thought content normal.  Vitals reviewed.    ASSESSMENT:    1. Coronary artery disease involving native coronary artery of native heart without angina pectoris   2. Essential hypertension   3. Aortic stenosis, moderate   4. Hyperlipidemia LDL goal <70    PLAN:    In order of problems listed above:  1.  CAD: Patient found to have significant CAD on cardiac CTA.  She underwent successful PCI and DES to the LAD 05/09/2018.  She has been started on beta-blocker and dual antiplatelet therapy with aspirin and ticagrelor for up to 12 months.  She is feeling significantly better since her stent.  Her left arm heaviness is still present but much less bothersome.  She is having no chest discomfort or shortness of breath.  She is walking for 20 minutes on most days with no exertional symptoms. -Recommend heart  healthy/Mediterranean diet, with whole grains, fruits, vegetable, fish, lean meats, nuts, and olive oil. Limit salt. -Recommend moderate walking, 3-5 times/week for 30-50 minutes each session. Aim for at least 150 minutes.week. Goal should be pace of 3 miles/hours, or walking 1.5 miles in 30 minutes -Recommend avoidance of tobacco products. Avoid excess alcohol.  2.  Hypertension: Patient was started on carvedilol in the hospital and continued on amlodipine 10 mg and ramipril 10 mg.  Patient reports that her blood pressure has been up into the 150s at home on several occasions.  We will increase carvedilol to 6.25 mg twice daily.  3.  Hyperlipidemia: LDL is 128.  With CAD her goal LDL is <70. The patient has had prior difficulties with multiple statins.  She was started on a low-dose of rosuvastatin 2.5 mg.  She is still somewhat resistant to initiating PCSK9 inhibitor even after in-depth discussion.  She wishes to try the rosuvastatin for 4 weeks.  We will recheck lipids at that time and if her LDL is not to goal of <70 I will again attempt to refer her to the lipid clinic if she will agree.  4.  Aortic stenosis: Moderate on echo and by cath with mean gradient of 19 mmHg.  We will continue to follow.   Medication Adjustments/Labs and Tests Ordered: Current medicines are reviewed at length with the patient today.  Concerns regarding medicines are outlined  above. Labs and tests ordered and medication changes are outlined in the patient instructions below:  Patient Instructions  Medication Instructions:  INCREASE: Carvedilol to 6.25 mg twice a day  If you need a refill on your cardiac medications before your next appointment, please call your pharmacy.   Lab work: Your physician recommends that you return for a FASTING lipid profile: 06/10/18 (lab is open from (7:30 am to 4:30 pm)  If you have labs (blood work) drawn today and your tests are completely normal, you will receive your results only  by: Marland Kitchen MyChart Message (if you have MyChart) OR . A paper copy in the mail If you have any lab test that is abnormal or we need to change your treatment, we will call you to review the results.  Testing/Procedures: None  Follow-Up: You are scheduled to see Dr. Marlou Porch on 08/01/18 @ 8:40 AM  Any Other Special Instructions Will Be Listed Below (If Applicable).  Coronary Artery Disease, Female  Coronary artery disease (CAD) is a condition in which the arteries that lead to the heart (coronary arteries) become narrow or blocked. The narrowing or blockage can lead to decreased blood flow to the heart. Prolonged reduced blood flow can cause a heart attack (myocardial infarction or MI). This condition may also be called coronary heart disease. Because CAD is the leading cause of death in women, it is important to understand what causes this condition and how it is treated. What are the causes? CAD is most often caused by atherosclerosis. This is the buildup of fat and cholesterol (plaque) on the inside of the arteries. Over time, the plaque may narrow or block the artery, reducing blood flow to the heart. Plaque can also become weak and break off within a coronary artery and cause a sudden blockage. Other less common causes of CAD include:  An embolism or blood clot in a coronary artery.  A tearing of the artery (spontaneous coronary artery dissection).  An aneurysm.  Inflammation (vasculitis) in the artery wall. What increases the risk? The following factors may make you more likely to develop this condition:  Age. Women over age 80 are at a greater risk of CAD.  Family history of CAD.  High blood pressure (hypertension).  Diabetes.  High cholesterol levels.  Tobacco use.  Lack of exercise.  Menopause. ? All postmenopausal women are at greater risk of CAD. ? Women who have experienced menopause between the ages of 84-45 (early menopause) are at a higher risk of CAD. ? Women who  have experienced menopause before age 67 (premature menopause) are at a very high risk of CAD.  Excessive alcohol use  A diet high in saturated and trans fats, such as fried food and processed meat. Other possible risk factors include:  High stress levels.  Depression  Obesity.  Sleep apnea. What are the signs or symptoms? Many people do not have any symptoms during the early stages of CAD. As the condition progresses, symptoms may include:  Chest pain (angina). The pain can: ? Feel like crushing or squeezing, or a tightness, pressure, fullness, or heaviness in the chest. ? Last more than a few minutes or can stop and recur. The pain tends to get worse with exercise or stress and to fade with rest.  Pain in the arms, neck, jaw, or back.  Unexplained heartburn or indigestion.  Shortness of breath.  Nausea.  Sudden cold sweats.  Sudden light-headedness.  Fluttering or fast heartbeat (palpitations). Many women have chest discomfort  and the other symptoms. However, women often have unusual (atypical) symptoms, such as:  Fatigue.  Vomiting.  Unexplained feelings of nervousness or anxiety.  Unexplained weakness.  Dizziness or fainting. How is this diagnosed? This condition is diagnosed based on:  Your family and medical history.  A physical exam.  Tests, including: ? A test to check the electrical signals in your heart (electrocardiogram). ? Exercise stress test. This looks for signs of blockage when the heart is stressed with exercise, such as running on a treadmill. ? Pharmacologic stress test. This test looks for signs of blockage when the heart is being stressed with a medicine. ? Blood tests. ? Coronary angiogram. This is a procedure to look at the coronary arteries to see if there is any blockage. During this test, a dye is injected into your arteries so they appear on an X-ray. ? A test that uses sound waves to take a picture of your heart  (echocardiogram). ? Chest X-ray. How is this treated? This condition may be treated by:  Healthy lifestyle changes to reduce risk factors.  Medicines such as: ? Antiplatelet medicines and blood-thinning medicines, such as aspirin. These help prevent blood clots. ? Nitroglycerin. ? Blood pressure medicines. ? Cholesterol-lowering medicine.  Coronary angioplasty and stenting. During this procedure, a thin, flexible tube is inserted through a blood vessel and into a blocked artery. A balloon or similar device on the end of the tube is inflated to open up the artery. In some cases, a small, mesh tube (stent) is inserted into the artery to keep it open.  Coronary artery bypass surgery. During this surgery, veins or arteries from other parts of the body are used to create a bypass around the blockage and allow blood to reach your heart. Follow these instructions at home: Medicines  Take over-the-counter and prescription medicines only as told by your health care provider.  Do not take the following medicines unless your health care provider approves: ? NSAIDs, such as ibuprofen, naproxen, or celecoxib. ? Vitamin supplements that contain vitamin A, vitamin E, or both. ? Hormone replacement therapy that contains estrogen with or without progestin. Lifestyle  Follow an exercise program approved by your health care provider. Aim for 150 minutes of moderate exercise or 75 minutes of vigorous exercise each week.  Maintain a healthy weight or lose weight as approved by your health care provider.  Rest when you are tired.  Learn to manage stress or try to limit your stress. Ask your health care provider for suggestions if you need help.  Get screened for depression and seek treatment, if needed.  Do not use any products that contain nicotine or tobacco, such as cigarettes and e-cigarettes. If you need help quitting, ask your health care provider.  Do not use illegal drugs. Eating and  drinking  Follow a heart-healthy diet. A dietitian can help educate you about healthy food options and changes. In general, eat plenty of fruits and vegetables, lean meats, and whole grains.  Avoid foods high in: ? Sugar. ? Salt (sodium). ? Saturated fats, such as processed or fatty meat. ? Trans fats, such as fried food.  Use healthy cooking methods such as roasting, grilling, broiling, baking, poaching, steaming, or stir-frying.  If you drink alcohol, and your health care provider approves, limit your alcohol intake to no more than 1 drink per day. One drink equals 12 ounces of beer, 5 ounces of wine, or 1 ounces of hard liquor. General instructions  Manage any other health  conditions, such as hypertension and diabetes. These conditions affect your heart.  Your health care provider may ask you to monitor your blood pressure. Ideally, your blood pressure should be below 130/80.  Keep all follow-up visits as told by your health care provider. This is important. Get help right away if:  You have pain in your chest, neck, arm, jaw, stomach, or back that: ? Lasts more than a few minutes. ? Is recurring. ? Is not relieved by taking medicine under your tongue (sublingualnitroglycerin).  You have profuse sweating without cause.  You have unexplained: ? Heartburn or indigestion. ? Shortness of breath or difficulty breathing. ? Fluttering or fast heartbeat (palpitations). ? Nausea or vomiting. ? Fatigue. ? Feelings of nervousness or anxiety. ? Weakness. ? Diarrhea.  You have sudden light-headedness or dizziness.  You faint.  You feel like hurting yourself or think about taking your own life. These symptoms may represent a serious problem that is an emergency. Do not wait to see if the symptoms will go away. Get medical help right away. Call your local emergency services (911 in the [place] Do not drive yourself to the hospital. Summary  Coronary artery disease (CAD) is a  process in which the arteries that lead to the heart (coronary arteries) become narrow or blocked. The narrowing or blockage can lead to a heart attack.  Many women have chest discomfort and other common symptoms of CAD. However, women often have different (atypical) symptoms, such as fatigue, vomiting, and dizziness or weakness.  CAD can be treated with lifestyle changes, medicines, surgery, or a combination of these treatments. This information is not intended to replace advice given to you by your health care provider. Make sure you discuss any questions you have with your health care provider. Document Released: 07/31/2011 Document Revised: 04/28/2016 Document Reviewed: 04/28/2016 Elsevier Interactive Patient Education  2019 [place], Daune Perch, NP  05/23/2018 6:24 PM    [facility]

## 2018-05-23 NOTE — Patient Instructions (Addendum)
Medication Instructions:  INCREASE: Carvedilol to 6.25 mg twice a day  If you need a refill on your cardiac medications before your next appointment, please call your pharmacy.   Lab work: Your physician recommends that you return for a FASTING lipid profile: 06/10/18 (lab is open from (7:30 am to 4:30 pm)  If you have labs (blood work) drawn today and your tests are completely normal, you will receive your results only by: Marland Kitchen MyChart Message (if you have MyChart) OR . A paper copy in the mail If you have any lab test that is abnormal or we need to change your treatment, we will call you to review the results.  Testing/Procedures: None  Follow-Up: You are scheduled to see Dr. Marlou Porch on 08/01/18 @ 8:40 AM  Any Other Special Instructions Will Be Listed Below (If Applicable).  Coronary Artery Disease, Female  Coronary artery disease (CAD) is a condition in which the arteries that lead to the heart (coronary arteries) become narrow or blocked. The narrowing or blockage can lead to decreased blood flow to the heart. Prolonged reduced blood flow can cause a heart attack (myocardial infarction or MI). This condition may also be called coronary heart disease. Because CAD is the leading cause of death in women, it is important to understand what causes this condition and how it is treated. What are the causes? CAD is most often caused by atherosclerosis. This is the buildup of fat and cholesterol (plaque) on the inside of the arteries. Over time, the plaque may narrow or block the artery, reducing blood flow to the heart. Plaque can also become weak and break off within a coronary artery and cause a sudden blockage. Other less common causes of CAD include:  An embolism or blood clot in a coronary artery.  A tearing of the artery (spontaneous coronary artery dissection).  An aneurysm.  Inflammation (vasculitis) in the artery wall. What increases the risk? The following factors may make you  more likely to develop this condition:  Age. Women over age 88 are at a greater risk of CAD.  Family history of CAD.  High blood pressure (hypertension).  Diabetes.  High cholesterol levels.  Tobacco use.  Lack of exercise.  Menopause. ? All postmenopausal women are at greater risk of CAD. ? Women who have experienced menopause between the ages of 65-45 (early menopause) are at a higher risk of CAD. ? Women who have experienced menopause before age 59 (premature menopause) are at a very high risk of CAD.  Excessive alcohol use  A diet high in saturated and trans fats, such as fried food and processed meat. Other possible risk factors include:  High stress levels.  Depression  Obesity.  Sleep apnea. What are the signs or symptoms? Many people do not have any symptoms during the early stages of CAD. As the condition progresses, symptoms may include:  Chest pain (angina). The pain can: ? Feel like crushing or squeezing, or a tightness, pressure, fullness, or heaviness in the chest. ? Last more than a few minutes or can stop and recur. The pain tends to get worse with exercise or stress and to fade with rest.  Pain in the arms, neck, jaw, or back.  Unexplained heartburn or indigestion.  Shortness of breath.  Nausea.  Sudden cold sweats.  Sudden light-headedness.  Fluttering or fast heartbeat (palpitations). Many women have chest discomfort and the other symptoms. However, women often have unusual (atypical) symptoms, such as:  Fatigue.  Vomiting.  Unexplained feelings  of nervousness or anxiety.  Unexplained weakness.  Dizziness or fainting. How is this diagnosed? This condition is diagnosed based on:  Your family and medical history.  A physical exam.  Tests, including: ? A test to check the electrical signals in your heart (electrocardiogram). ? Exercise stress test. This looks for signs of blockage when the heart is stressed with exercise, such  as running on a treadmill. ? Pharmacologic stress test. This test looks for signs of blockage when the heart is being stressed with a medicine. ? Blood tests. ? Coronary angiogram. This is a procedure to look at the coronary arteries to see if there is any blockage. During this test, a dye is injected into your arteries so they appear on an X-ray. ? A test that uses sound waves to take a picture of your heart (echocardiogram). ? Chest X-ray. How is this treated? This condition may be treated by:  Healthy lifestyle changes to reduce risk factors.  Medicines such as: ? Antiplatelet medicines and blood-thinning medicines, such as aspirin. These help prevent blood clots. ? Nitroglycerin. ? Blood pressure medicines. ? Cholesterol-lowering medicine.  Coronary angioplasty and stenting. During this procedure, a thin, flexible tube is inserted through a blood vessel and into a blocked artery. A balloon or similar device on the end of the tube is inflated to open up the artery. In some cases, a small, mesh tube (stent) is inserted into the artery to keep it open.  Coronary artery bypass surgery. During this surgery, veins or arteries from other parts of the body are used to create a bypass around the blockage and allow blood to reach your heart. Follow these instructions at home: Medicines  Take over-the-counter and prescription medicines only as told by your health care provider.  Do not take the following medicines unless your health care provider approves: ? NSAIDs, such as ibuprofen, naproxen, or celecoxib. ? Vitamin supplements that contain vitamin A, vitamin E, or both. ? Hormone replacement therapy that contains estrogen with or without progestin. Lifestyle  Follow an exercise program approved by your health care provider. Aim for 150 minutes of moderate exercise or 75 minutes of vigorous exercise each week.  Maintain a healthy weight or lose weight as approved by your health care  provider.  Rest when you are tired.  Learn to manage stress or try to limit your stress. Ask your health care provider for suggestions if you need help.  Get screened for depression and seek treatment, if needed.  Do not use any products that contain nicotine or tobacco, such as cigarettes and e-cigarettes. If you need help quitting, ask your health care provider.  Do not use illegal drugs. Eating and drinking  Follow a heart-healthy diet. A dietitian can help educate you about healthy food options and changes. In general, eat plenty of fruits and vegetables, lean meats, and whole grains.  Avoid foods high in: ? Sugar. ? Salt (sodium). ? Saturated fats, such as processed or fatty meat. ? Trans fats, such as fried food.  Use healthy cooking methods such as roasting, grilling, broiling, baking, poaching, steaming, or stir-frying.  If you drink alcohol, and your health care provider approves, limit your alcohol intake to no more than 1 drink per day. One drink equals 12 ounces of beer, 5 ounces of wine, or 1 ounces of hard liquor. General instructions  Manage any other health conditions, such as hypertension and diabetes. These conditions affect your heart.  Your health care provider may ask you to  monitor your blood pressure. Ideally, your blood pressure should be below 130/80.  Keep all follow-up visits as told by your health care provider. This is important. Get help right away if:  You have pain in your chest, neck, arm, jaw, stomach, or back that: ? Lasts more than a few minutes. ? Is recurring. ? Is not relieved by taking medicine under your tongue (sublingualnitroglycerin).  You have profuse sweating without cause.  You have unexplained: ? Heartburn or indigestion. ? Shortness of breath or difficulty breathing. ? Fluttering or fast heartbeat (palpitations). ? Nausea or vomiting. ? Fatigue. ? Feelings of nervousness or anxiety. ? Weakness. ? Diarrhea.  You have  sudden light-headedness or dizziness.  You faint.  You feel like hurting yourself or think about taking your own life. These symptoms may represent a serious problem that is an emergency. Do not wait to see if the symptoms will go away. Get medical help right away. Call your local emergency services (911 in the [place] Do not drive yourself to the hospital. Summary  Coronary artery disease (CAD) is a process in which the arteries that lead to the heart (coronary arteries) become narrow or blocked. The narrowing or blockage can lead to a heart attack.  Many women have chest discomfort and other common symptoms of CAD. However, women often have different (atypical) symptoms, such as fatigue, vomiting, and dizziness or weakness.  CAD can be treated with lifestyle changes, medicines, surgery, or a combination of these treatments. This information is not intended to replace advice given to you by your health care provider. Make sure you discuss any questions you have with your health care provider. Document Released: 07/31/2011 Document Revised: 04/28/2016 Document Reviewed: 04/28/2016 Elsevier Interactive Patient Education  2019 [employer].

## 2018-05-27 ENCOUNTER — Other Ambulatory Visit: Payer: Self-pay | Admitting: Endocrinology

## 2018-05-29 ENCOUNTER — Telehealth (HOSPITAL_COMMUNITY): Payer: Self-pay

## 2018-05-29 NOTE — Telephone Encounter (Signed)
Pt insurance is active and benefits verified through Medicare A/B. Co-pay $0.00, DED $198.00/$0.00 met, out of pocket $0.00/$0.00 met, co-insurance 20%. No pre-authorization required. Passport, 05/29/2018 @ 11:10AM, REF# [id number]  2ndary insurance is active and benefits verified through [place]. Co-pay $0.00, DED $0.00/$0.00 met, out of pocket $0.00/$0.00 met, co-insurance 0%. No pre-authorization required. Passport, 05/29/2018 @ 11:11AM, REF# [id number]

## 2018-06-07 ENCOUNTER — Other Ambulatory Visit: Payer: Self-pay | Admitting: Physician Assistant

## 2018-06-07 MED ORDER — ROSUVASTATIN CALCIUM 5 MG PO TABS: 2.5000 mg | ORAL_TABLET | Freq: Every day | ORAL | 3 refills | Status: DC

## 2018-06-10 ENCOUNTER — Other Ambulatory Visit: Payer: Medicare Other | Admitting: *Deleted

## 2018-06-10 DIAGNOSIS — E785 Hyperlipidemia, unspecified: Secondary | ICD-10-CM

## 2018-06-10 LAB — LIPID PANEL
Chol/HDL Ratio: 3.5 ratio (ref 0.0–4.4)
Cholesterol, Total: 120 mg/dL (ref 100–199)
HDL: 34 mg/dL — ABNORMAL LOW (ref >39)
LDL Calculated: 60 mg/dL (ref 0–99)
TRIGLYCERIDES: 128 mg/dL (ref 0–149)
VLDL Cholesterol Cal: 26 mg/dL (ref 5–40)

## 2018-06-11 DIAGNOSIS — H5213 Myopia, bilateral: Secondary | ICD-10-CM | POA: Diagnosis not present

## 2018-06-11 DIAGNOSIS — E119 Type 2 diabetes mellitus without complications: Secondary | ICD-10-CM | POA: Diagnosis not present

## 2018-06-11 DIAGNOSIS — Z961 Presence of intraocular lens: Secondary | ICD-10-CM | POA: Diagnosis not present

## 2018-06-11 LAB — HM DIABETES EYE EXAM

## 2018-06-11 NOTE — Telephone Encounter (Signed)
Called patient to see if she was interested in participating in the Cardiac Rehab Program. Patient stated yes. Patient will come in for orientation on 07/11/2018 @ 730AM and will attend the 245PM exercise class. Went over insurance, patient verbalized understanding.   Mailed homework package.

## 2018-06-12 ENCOUNTER — Telehealth: Payer: Self-pay

## 2018-06-12 NOTE — Telephone Encounter (Signed)
-----   Message from Daune Perch, NP sent at 06/11/2018  1:35 PM EST ----- Lipid panel shows LDL down to 60 which is at goal of <70. Continue current therapy. HDL (good cholesterol) is low at 34. Some ways to bring that up are exercise and eating healthy fats like olive oil, nuts, avocado and fish.   Daune Perch, NP

## 2018-06-12 NOTE — Telephone Encounter (Signed)
Left detailed message (DPR on File) for patient about lab results. 

## 2018-06-14 ENCOUNTER — Encounter: Payer: Self-pay | Admitting: Family Medicine

## 2018-06-14 ENCOUNTER — Telehealth (HOSPITAL_COMMUNITY): Payer: Self-pay

## 2018-07-03 ENCOUNTER — Telehealth (HOSPITAL_COMMUNITY): Payer: Self-pay

## 2018-07-04 ENCOUNTER — Other Ambulatory Visit: Payer: Self-pay | Admitting: Endocrinology

## 2018-07-04 NOTE — Telephone Encounter (Signed)
Cardiac Rehab Medication Review by a Pharmacist  Does the patient feel that his/her medications are working for him/her?  yes  Has the patient been experiencing any side effects to the medications prescribed?  Yes - bruising  Does the patient measure his/her own blood pressure or blood glucose at home?  Yes - 140/70s   Does the patient have any problems obtaining medications due to transportation or finances?   no  Understanding of regimen: good Understanding of indications: good Potential of compliance: good  Pharmacist comments:  Instructed patient to monitor bruising. If bruising worsens or if any other sources of bleeding are noticed (epistaxis, hemoptysis, blood in stool, etc.) to seek medical care for evaluation.   Patient is on 112 mcg levothyroxine per Dr. Dwyane Dee. At last visit (04/16/18), patient was instructed to continue taking 100 mcg levothyroxine tabs but add extra 1 tablet weekly until she ran out.  Vertis Kelch, PharmD PGY1 Pharmacy Resident Phone [phone] 07/04/2018       3:25 PM

## 2018-07-05 NOTE — Progress Notes (Signed)
Joanna [name] 68 y.o. female DOB March 10, 1951 MRN [id number]       Nutrition Screen Note  No diagnosis found. Past Medical History:  Diagnosis Date  . Abnormal liver function tests   . Adenomatous colon polyp   . Aortic stenosis, moderate 05/09/2018  . CAD (coronary artery disease) 05/09/2018   a. LHC 05/09/18: DES to mid/dist LAD, mod AS  . Cerebrovascular disease    a. CT 04/2016 -calcific plaque at the origin LEFT vertebral contributing to severe stenosis.  . Diverticulitis of colon   . Fatty liver    a. mild fatty liver infiltration by CT 2014.  Marland Kitchen Foot injury 2008   Left foot/leg with ?? torn muscle -PROBLEM HAS RESOLVED  . Hand pain 05/2007   steroid injections--RESOLVED  . Heart murmur   . History of kidney stones   . Hx of renal calculi    LEFT  . Hyperlipidemia   . Hypertension   . Hypothyroidism    pt states hx of thyroid nodules  . IBS (irritable bowel syndrome)   . Internal hemorrhoids   . Melanoma ([place]) 1980s   mid upper stomach (05/08/2018)  . OSA on CPAP     SETTING IS 14 (05/08/2018)  . Statin intolerance   . Stroke [facility])    a. right thalamic infarct in 04/2016 felt by neuro to be due to small vessel disease.  . Type II diabetes mellitus ([place])    Meds reviewed.    Current Outpatient Medications (Endocrine & Metabolic):  Marland Kitchen  Dulaglutide (TRULICITY) 1.5 GQ/6.7YP SOPN, INJECT CONTENTS OF PEN WEEKLY (Patient taking differently: Inject 1.5 mg into the skin once a week. Tuesday) .  glimepiride (AMARYL) 1 MG tablet, TAKE 1 TABLET (1 MG TOTAL) BY MOUTH AT BEDTIME. Marland Kitchen  levothyroxine (SYNTHROID) 112 MCG tablet, Take 1 tablet (112 mcg total) by mouth daily before breakfast. .  metFORMIN (GLUCOPHAGE) 1000 MG tablet, TAKE 1 TABLET (1,000 MG TOTAL) BY MOUTH 2 (TWO) TIMES DAILY. (Patient taking differently: Take 1,000 mg by mouth 2 (two) times daily with a meal. ) .  SYNTHROID 100 MCG tablet, TAKE 1 TABLET BY MOUTH DAILY (Patient not taking: Reported on 07/04/2018)  Current  Outpatient Medications (Cardiovascular):  .  amLODipine (NORVASC) 10 MG tablet, Take 1 tablet (10 mg total) by mouth daily. .  carvedilol (COREG) 6.25 MG tablet, Take 1 tablet (6.25 mg total) by mouth 2 (two) times daily. .  nitroGLYCERIN (NITROSTAT) 0.4 MG SL tablet, Place 1 tablet (0.4 mg total) under the tongue every 5 (five) minutes x 3 doses as needed for chest pain. (Patient not taking: Reported on 07/04/2018) .  ramipril (ALTACE) 10 MG capsule, Take 1 capsule (10 mg total) by mouth daily. .  rosuvastatin (CRESTOR) 5 MG tablet, Take 0.5 tablets (2.5 mg total) by mouth daily.   Current Outpatient Medications (Analgesics):  .  aspirin EC 81 MG tablet, Take 1 tablet (81 mg total) by mouth daily.  Current Outpatient Medications (Hematological):  .  ticagrelor (BRILINTA) 90 MG TABS tablet, Take 1 tablet (90 mg total) by mouth 2 (two) times daily.  Current Outpatient Medications (Other):  .  cholecalciferol (VITAMIN D) 1000 units tablet, Take 2,000 Units by mouth daily. Marland Kitchen  glucose blood (ONE TOUCH ULTRA TEST) test strip, USE TO CHECK BLOOD SUGAR TWICE DAILY  Dx:E11.65 .  hydrocortisone (ANUSOL-HC) 2.5 % rectal cream, Use small amount in the perianal area 3 times daily as needed. .  Magnesium 250 MG TABS, Take 1 tablet  by mouth 2 (two) times daily.   HT: Ht Readings from Last 1 Encounters:  05/23/18 5\' 4"  (1.626 m)    WT: Wt Readings from Last 5 Encounters:  05/23/18 166 lb 12.8 oz (75.7 kg)  05/09/18 162 lb 3.2 oz (73.6 kg)  05/08/18 165 lb 12.8 oz (75.2 kg)  04/24/18 167 lb 8 oz (76 kg)  04/16/18 166 lb (75.3 kg)     BMI = 28.62  Current tobacco use? No       Labs:  Lipid Panel     Component Value Date/Time   CHOL 120 06/10/2018 0818   TRIG 128 06/10/2018 0818   HDL 34 (L) 06/10/2018 0818   CHOLHDL 3.5 06/10/2018 0818   CHOLHDL 5.4 05/09/2018 0512   VLDL 31 05/09/2018 0512   LDLCALC 60 06/10/2018 0818   LDLDIRECT 209 (H) 03/19/2018 1643   LDLDIRECT 170.0 12/11/2016  0943    Lab Results  Component Value Date   HGBA1C 5.5 05/08/2018   CBG (last 3)  No results for input(s): GLUCAP in the last 72 hours.  Nutrition Diagnosis ? Food-and nutrition-related knowledge deficit related to lack of exposure to information as related to diagnosis of: ? CVD ?  ? Overweight  related to excessive energy intake as evidenced by a BMI = 28. 62  Nutrition Goal(s):  ? To be determined  Plan:  Pt to attend nutrition classes ? Nutrition I ? Nutrition II ? Portion Distortion  Will provide client-centered nutrition education as part of interdisciplinary care.   Monitor and evaluate progress toward nutrition goal with team.  Laurina Bustle, MS, RD, LDN 07/05/2018 2:58 PM

## 2018-07-09 NOTE — Telephone Encounter (Signed)
Not sure what would have caused bruising per se other than hypersensitivity to bruising from antiplatelet agents. If swelling is improved would continue with conservative management.   Candee Furbish, MD

## 2018-07-11 ENCOUNTER — Encounter (HOSPITAL_COMMUNITY): Payer: Self-pay

## 2018-07-11 ENCOUNTER — Encounter (HOSPITAL_COMMUNITY)
Admission: RE | Admit: 2018-07-11 | Discharge: 2018-07-11 | Disposition: A | Discharge status: Home / Self Care | Payer: Medicare Other | Source: Ambulatory Visit | Attending: Cardiology | Admitting: Cardiology

## 2018-07-11 VITALS — BP 110/64 | HR 80 | Ht 64.0 in | Wt 164.5 lb

## 2018-07-11 DIAGNOSIS — Z955 Presence of coronary angioplasty implant and graft: Secondary | ICD-10-CM | POA: Insufficient documentation

## 2018-07-11 NOTE — Progress Notes (Signed)
FLYNN LININGER 68 y.o. female DOB: July 10, 1950 MRN: [id number]      Nutrition Note  1. Status post coronary artery stent placement    Past Medical History:  Diagnosis Date  . Abnormal liver function tests   . Adenomatous colon polyp   . Aortic stenosis, moderate 05/09/2018  . CAD (coronary artery disease) 05/09/2018   a. LHC 05/09/18: DES to mid/dist LAD, mod AS  . Cerebrovascular disease    a. CT 04/2016 -calcific plaque at the origin LEFT vertebral contributing to severe stenosis.  . Diverticulitis of colon   . Fatty liver    a. mild fatty liver infiltration by CT 2014.  Marland Kitchen Foot injury 2008   Left foot/leg with ?? torn muscle -PROBLEM HAS RESOLVED  . Hand pain 05/2007   steroid injections--RESOLVED  . Heart murmur   . History of kidney stones   . Hx of renal calculi    LEFT  . Hyperlipidemia   . Hypertension   . Hypothyroidism    pt states hx of thyroid nodules  . IBS (irritable bowel syndrome)   . Internal hemorrhoids   . Melanoma ([place]) 1980s   mid upper stomach (05/08/2018)  . OSA on CPAP     SETTING IS 14 (05/08/2018)  . Statin intolerance   . Stroke [facility])    a. right thalamic infarct in 04/2016 felt by neuro to be due to small vessel disease.  . Type II diabetes mellitus ([place])    Meds reviewed.   Current Outpatient Medications (Endocrine & Metabolic):  Marland Kitchen  Dulaglutide (TRULICITY) 1.5 PO/2.4MP SOPN, INJECT CONTENTS OF PEN WEEKLY (Patient taking differently: Inject 1.5 mg into the skin once a week. Tuesday) .  glimepiride (AMARYL) 1 MG tablet, TAKE 1 TABLET (1 MG TOTAL) BY MOUTH AT BEDTIME. Marland Kitchen  levothyroxine (SYNTHROID) 112 MCG tablet, Take 1 tablet (112 mcg total) by mouth daily before breakfast. .  metFORMIN (GLUCOPHAGE) 1000 MG tablet, TAKE 1 TABLET (1,000 MG TOTAL) BY MOUTH 2 (TWO) TIMES DAILY. (Patient taking differently: Take 1,000 mg by mouth 2 (two) times daily with a meal. ) .  SYNTHROID 100 MCG tablet, TAKE 1 TABLET BY MOUTH DAILY (Patient not taking: Reported on  07/04/2018)  Current Outpatient Medications (Cardiovascular):  .  amLODipine (NORVASC) 10 MG tablet, Take 1 tablet (10 mg total) by mouth daily. .  carvedilol (COREG) 6.25 MG tablet, Take 1 tablet (6.25 mg total) by mouth 2 (two) times daily. .  nitroGLYCERIN (NITROSTAT) 0.4 MG SL tablet, Place 1 tablet (0.4 mg total) under the tongue every 5 (five) minutes x 3 doses as needed for chest pain. (Patient not taking: Reported on 07/04/2018) .  ramipril (ALTACE) 10 MG capsule, Take 1 capsule (10 mg total) by mouth daily. .  rosuvastatin (CRESTOR) 5 MG tablet, Take 0.5 tablets (2.5 mg total) by mouth daily.   Current Outpatient Medications (Analgesics):  .  aspirin EC 81 MG tablet, Take 1 tablet (81 mg total) by mouth daily.  Current Outpatient Medications (Hematological):  .  ticagrelor (BRILINTA) 90 MG TABS tablet, Take 1 tablet (90 mg total) by mouth 2 (two) times daily.  Current Outpatient Medications (Other):  .  cholecalciferol (VITAMIN D) 1000 units tablet, Take 2,000 Units by mouth daily. Marland Kitchen  glucose blood (ONE TOUCH ULTRA TEST) test strip, USE TO CHECK BLOOD SUGAR TWICE DAILY  Dx:E11.65 .  hydrocortisone (ANUSOL-HC) 2.5 % rectal cream, Use small amount in the perianal area 3 times daily as needed. .  Magnesium 250 MG  TABS, Take 1 tablet by mouth 2 (two) times daily.   HT: Ht Readings from Last 1 Encounters:  07/11/18 5\' 4"  (1.626 m)    WT: Wt Readings from Last 5 Encounters:  07/11/18 164 lb 7.4 oz (74.6 kg)  05/23/18 166 lb 12.8 oz (75.7 kg)  05/09/18 162 lb 3.2 oz (73.6 kg)  05/08/18 165 lb 12.8 oz (75.2 kg)  04/24/18 167 lb 8 oz (76 kg)     Body mass index is 28.23 kg/m.   Current tobacco use? No  Labs:  Lipid Panel     Component Value Date/Time   CHOL 120 06/10/2018 0818   TRIG 128 06/10/2018 0818   HDL 34 (L) 06/10/2018 0818   CHOLHDL 3.5 06/10/2018 0818   CHOLHDL 5.4 05/09/2018 0512   VLDL 31 05/09/2018 0512   LDLCALC 60 06/10/2018 0818   LDLDIRECT 209 (H)  03/19/2018 1643   LDLDIRECT 170.0 12/11/2016 0943    Lab Results  Component Value Date   HGBA1C 5.5 05/08/2018   CBG (last 3)  No results for input(s): GLUCAP in the last 72 hours.  Nutrition Note Spoke with pt. Nutrition plan and goals reviewed with pt. Pt is following Step 2 of the Therapeutic Lifestyle Changes diet. Pt wants to lose wt. Pt has been trying to lose wt by using weight watchers program. Heart healthy diabetic wt loss tips reviewed (label reading, how to build a healthy plate, portion sizes, eating frequently across the day, weighing and measuring servings). Discussed the differences between complex and refined carbs, recommended pt replace refined carbs with complex. Reviewed the benefits swapping in complex carbs and moderating portion sizes can have on managing blood glucose with patient. Pt has Type 2 Diabetes. Last A1c indicates blood glucose well-controlled. This Probation officer went over Diabetes Education test results. Pt checks CBG's 4 times a day. Fasting CBG's reportedly 120's mg/dL. Per discussion, pt does use canned/convenience foods often. Pt does not add salt to food. Pt does not eat out frequently. Pt expressed understanding of the information reviewed. Pt aware of nutrition education classes offered and would like to attend nutrition classes.  Nutrition Diagnosis ? Food-and nutrition-related knowledge deficit related to lack of exposure to information as related to diagnosis of: ? CVD ? Type 2 Diabetes ? Overweight  related to excessive energy intake as evidenced by a Body mass index is 28.23 kg/m.  Nutrition Intervention ? Pt's individual nutrition plan and goals reviewed with pt. ? Pt given handouts for: ? Nutrition I class ? Nutrition II class  ? Diabetes Blitz Class ? Diabetes Q & A class   Nutrition Goal(s):  ? Pt to identify and limit food sources of saturated fat, trans fat, refined carbohydrates and sodium ? Pt to identify food quantities necessary to achieve  weight loss of 6-24 lb at graduation from cardiac rehab.  ? Pt to build a healthy plate including vegetables, fruits, whole grains, and low-fat dairy products in a heart healthy meal plan. ? Pt to weigh and measure serving  sizes  Plan:  ? Pt to attend nutrition classes:  ? Nutrition I ? Nutrition II ? Portion Distortion  ? Diabetes Blitz ? Diabetes Q & Ae determined ? Will provide client-centered nutrition education as part of interdisciplinary care ? Monitor and evaluate progress toward nutrition goal with team.   Laurina Bustle, MS, RD, LDN 07/11/2018 10:50 AM

## 2018-07-11 NOTE — Progress Notes (Signed)
Cardiac Individual Treatment Plan  Patient Details  Name: Joanna [name] MRN: [id number] Date of Birth: 04-14-1951 Referring Provider:     [facility] from 07/11/2018 in [place]  Referring Provider  Dr. Marlou Porch      Initial Encounter Date:    CARDIAC REHAB PHASE II ORIENTATION from 07/11/2018 in [place]  Date  07/11/18      Visit Diagnosis: Status post coronary artery stent placement 05/09/18 DES LAD  Patient's Home Medications on Admission:  Current Outpatient Medications:  .  amLODipine (NORVASC) 10 MG tablet, Take 1 tablet (10 mg total) by mouth daily., Disp: 90 tablet, Rfl: 3 .  aspirin EC 81 MG tablet, Take 1 tablet (81 mg total) by mouth daily., Disp: 30 tablet, Rfl: 0 .  carvedilol (COREG) 6.25 MG tablet, Take 1 tablet (6.25 mg total) by mouth 2 (two) times daily., Disp: 180 tablet, Rfl: 3 .  cholecalciferol (VITAMIN D) 1000 units tablet, Take 2,000 Units by mouth daily., Disp: , Rfl:  .  Dulaglutide (TRULICITY) 1.5 FA/2.1HY SOPN, INJECT CONTENTS OF PEN WEEKLY (Patient taking differently: Inject 1.5 mg into the skin once a week. Tuesday), Disp: 12 pen, Rfl: 5 .  glimepiride (AMARYL) 1 MG tablet, TAKE 1 TABLET (1 MG TOTAL) BY MOUTH AT BEDTIME., Disp: 30 tablet, Rfl: 1 .  glucose blood (ONE TOUCH ULTRA TEST) test strip, USE TO CHECK BLOOD SUGAR TWICE DAILY  Dx:E11.65, Disp: 100 each, Rfl: 3 .  hydrocortisone (ANUSOL-HC) 2.5 % rectal cream, Use small amount in the perianal area 3 times daily as needed., Disp: 30 g, Rfl: 1 .  levothyroxine (SYNTHROID) 112 MCG tablet, Take 1 tablet (112 mcg total) by mouth daily before breakfast., Disp: 90 tablet, Rfl: 0 .  Magnesium 250 MG TABS, Take 1 tablet by mouth 2 (two) times daily., Disp: , Rfl:  .  metFORMIN (GLUCOPHAGE) 1000 MG tablet, TAKE 1 TABLET (1,000 MG TOTAL) BY MOUTH 2 (TWO) TIMES DAILY. (Patient taking differently: Take 1,000 mg by mouth 2 (two)  times daily with a meal. ), Disp: 180 tablet, Rfl: 3 .  nitroGLYCERIN (NITROSTAT) 0.4 MG SL tablet, Place 1 tablet (0.4 mg total) under the tongue every 5 (five) minutes x 3 doses as needed for chest pain. (Patient not taking: Reported on 07/04/2018), Disp: 25 tablet, Rfl: 12 .  ramipril (ALTACE) 10 MG capsule, Take 1 capsule (10 mg total) by mouth daily., Disp: 90 capsule, Rfl: 3 .  rosuvastatin (CRESTOR) 5 MG tablet, Take 0.5 tablets (2.5 mg total) by mouth daily., Disp: 45 tablet, Rfl: 3 .  SYNTHROID 100 MCG tablet, TAKE 1 TABLET BY MOUTH DAILY (Patient not taking: Reported on 07/04/2018), Disp: 90 tablet, Rfl: 3 .  ticagrelor (BRILINTA) 90 MG TABS tablet, Take 1 tablet (90 mg total) by mouth 2 (two) times daily., Disp: 180 tablet, Rfl: 3  Past Medical History: Past Medical History:  Diagnosis Date  . Abnormal liver function tests   . Adenomatous colon polyp   . Aortic stenosis, moderate 05/09/2018  . CAD (coronary artery disease) 05/09/2018   a. LHC 05/09/18: DES to mid/dist LAD, mod AS  . Cerebrovascular disease    a. CT 04/2016 -calcific plaque at the origin LEFT vertebral contributing to severe stenosis.  . Diverticulitis of colon   . Fatty liver    a. mild fatty liver infiltration by CT 2014.  Marland Kitchen Foot injury 2008   Left foot/leg with ?? torn muscle -  PROBLEM HAS RESOLVED  . Hand pain 05/2007   steroid injections--RESOLVED  . Heart murmur   . History of kidney stones   . Hx of renal calculi    LEFT  . Hyperlipidemia   . Hypertension   . Hypothyroidism    pt states hx of thyroid nodules  . IBS (irritable bowel syndrome)   . Internal hemorrhoids   . Melanoma ([place]) 1980s   mid upper stomach (05/08/2018)  . OSA on CPAP     SETTING IS 14 (05/08/2018)  . Statin intolerance   . Stroke [facility])    a. right thalamic infarct in 04/2016 felt by neuro to be due to small vessel disease.  . Type II diabetes mellitus ([place])     Tobacco Use: Social History   Tobacco Use  Smoking Status  Never Smoker  Smokeless Tobacco Never Used    Labs: Recent Review Flowsheet Data    Labs for ITP Cardiac and Pulmonary Rehab Latest Ref Rng & Units 03/19/2018 04/11/2018 05/08/2018 05/09/2018 06/10/2018   Cholestrol 100 - 199 mg/dL 276(H) - - 195 120   LDLCALC 0 - 99 mg/dL 190(H) - - 128(H) 60   LDLDIRECT 0 - 99 mg/dL 209(H) - - - -   HDL >39 mg/dL 43 - - 36(L) 34(L)   Trlycerides 0 - 149 mg/dL 217(H) - - 153(H) 128   Hemoglobin A1c 4.8 - 5.6 % - 6.2 5.5 - -      Capillary Blood Glucose: Lab Results  Component Value Date   GLUCAP 130 (H) 05/10/2018   GLUCAP 106 (H) 05/09/2018   GLUCAP 121 (H) 05/09/2018   GLUCAP 96 05/09/2018   GLUCAP 109 (H) 05/09/2018     Exercise Target Goals: Exercise Program Goal: Individual exercise prescription set using results from initial 6 min walk test and THRR while considering  patient's activity barriers and safety.   Exercise Prescription Goal: Initial exercise prescription builds to 30-45 minutes a day of aerobic activity, 2-3 days per week.  Home exercise guidelines will be given to patient during program as part of exercise prescription that the participant will acknowledge.  Activity Barriers & Risk Stratification: Activity Barriers & Cardiac Risk Stratification - 07/11/18 1026      Activity Barriers & Cardiac Risk Stratification   Activity Barriers  None    Cardiac Risk Stratification  Moderate       6 Minute Walk: 6 Minute Walk    Row Name 07/11/18 1023         6 Minute Walk   Phase  Initial     Distance  1800 feet     Walk Time  6 minutes     MPH  3.4     METS  3.8     RPE  11     VO2 Peak  13.3     Symptoms  No     Resting HR  80 bpm     Resting BP  110/64     Resting Oxygen Saturation   98 %     Exercise Oxygen Saturation  during 6 min walk  97 %     Max Ex. HR  99 bpm     Max Ex. BP  112/66     2 Minute Post BP  120/70        Oxygen Initial Assessment:   Oxygen Re-Evaluation:   Oxygen Discharge (Final  Oxygen Re-Evaluation):   Initial Exercise Prescription: Initial Exercise Prescription - 07/11/18 1000  Date of Initial Exercise RX and Referring Provider   Date  07/11/18    Referring Provider  Dr. Marlou Porch    Expected Discharge Date  10/18/18      Treadmill   MPH  3.6    Grade  1    Minutes  10      Recumbant Bike   Level  2    Watts  45    Minutes  10    METs  3.89      NuStep   Level  3    SPM  85    Minutes  10    METs  3.6      Prescription Details   Frequency (times per week)  3    Duration  Progress to 30 minutes of continuous aerobic without signs/symptoms of physical distress      Intensity   THRR 40-80% of Max Heartrate  62-124    Ratings of Perceived Exertion  11-13      Progression   Progression  Continue to progress workloads to maintain intensity without signs/symptoms of physical distress.      Resistance Training   Training Prescription  Yes    Weight  4 lbs.     Reps  10-15       Perform Capillary Blood Glucose checks as needed.  Exercise Prescription Changes:   Exercise Comments:   Exercise Goals and Review: Exercise Goals    Row Name 07/11/18 1029             Exercise Goals   Increase Physical Activity  Yes       Intervention  Provide advice, education, support and counseling about physical activity/exercise needs.;Develop an individualized exercise prescription for aerobic and resistive training based on initial evaluation findings, risk stratification, comorbidities and participant's personal goals.       Expected Outcomes  Short Term: Attend rehab on a regular basis to increase amount of physical activity.       Increase Strength and Stamina  Yes       Intervention  Provide advice, education, support and counseling about physical activity/exercise needs.;Develop an individualized exercise prescription for aerobic and resistive training based on initial evaluation findings, risk stratification, comorbidities and participant's  personal goals.       Expected Outcomes  Short Term: Increase workloads from initial exercise prescription for resistance, speed, and METs.       Able to understand and use rate of perceived exertion (RPE) scale  Yes       Intervention  Provide education and explanation on how to use RPE scale       Expected Outcomes  Short Term: Able to use RPE daily in rehab to express subjective intensity level;Long Term:  Able to use RPE to guide intensity level when exercising independently       Knowledge and understanding of Target Heart Rate Range (THRR)  Yes       Intervention  Provide education and explanation of THRR including how the numbers were predicted and where they are located for reference       Expected Outcomes  Short Term: Able to state/look up THRR;Long Term: Able to use THRR to govern intensity when exercising independently;Short Term: Able to use daily as guideline for intensity in rehab       Able to check pulse independently  Yes       Intervention  Provide education and demonstration on how to check pulse in carotid and radial arteries.;Review the  importance of being able to check your own pulse for safety during independent exercise       Expected Outcomes  Short Term: Able to explain why pulse checking is important during independent exercise;Long Term: Able to check pulse independently and accurately       Understanding of Exercise Prescription  Yes       Intervention  Provide education, explanation, and written materials on patient's individual exercise prescription       Expected Outcomes  Short Term: Able to explain program exercise prescription;Long Term: Able to explain home exercise prescription to exercise independently          Exercise Goals Re-Evaluation :   Discharge Exercise Prescription (Final Exercise Prescription Changes):   Nutrition:  Target Goals: Understanding of nutrition guidelines, daily intake of sodium 1500mg , cholesterol 200mg , calories 30% from fat  and 7% or less from saturated fats, daily to have 5 or more servings of fruits and vegetables.  Biometrics: Pre Biometrics - 07/11/18 1030      Pre Biometrics   Height  5\' 4"  (1.626 m)    Weight  74.6 kg    Waist Circumference  38.5 inches    Hip Circumference  39 inches    Waist to Hip Ratio  0.99 %    BMI (Calculated)  28.22    Triceps Skinfold  10 mm    % Body Fat  35 %    Grip Strength  32 kg    Flexibility  13 in    Single Leg Stand  30 seconds        Nutrition Therapy Plan and Nutrition Goals: Nutrition Therapy & Goals - 07/11/18 1050      Nutrition Therapy   Diet  Heart healthy, carb modified      Personal Nutrition Goals   Nutrition Goal  Pt to identify and limit food sources of saturated fat, trans fat, refined carbohydrates and sodium    Personal Goal #2  Pt to identify food quantities necessary to achieve weight loss of 6-24 lb at graduation from cardiac rehab    Personal Goal #3  Pt to build a healthy plate including vegetables, fruits, whole grains, and low-fat dairy products in a heart healthy meal plan    Personal Goal #4  Pt to weigh and measure serving  sizes      Intervention Plan   Intervention  Prescribe, educate and counsel regarding individualized specific dietary modifications aiming towards targeted core components such as weight, hypertension, lipid management, diabetes, heart failure and other comorbidities.    Expected Outcomes  Short Term Goal: Understand basic principles of dietary content, such as calories, fat, sodium, cholesterol and nutrients.;Long Term Goal: Adherence to prescribed nutrition plan.       Nutrition Assessments: Nutrition Assessments - 07/11/18 1056      Rate Your Plate Scores   Pre Score  23       Nutrition Goals Re-Evaluation: Nutrition Goals Re-Evaluation    [place] Name 07/11/18 1050             Goals   Current Weight  164 lb 7.4 oz (74.6 kg)          Nutrition Goals Re-Evaluation: Nutrition Goals  Re-Evaluation    [place] Name 07/11/18 1050             Goals   Current Weight  164 lb 7.4 oz (74.6 kg)          Nutrition Goals Discharge (Final Nutrition Goals Re-Evaluation):  Nutrition Goals Re-Evaluation - 07/11/18 1050      Goals   Current Weight  164 lb 7.4 oz (74.6 kg)       Psychosocial: Target Goals: Acknowledge presence or absence of significant depression and/or stress, maximize coping skills, provide positive support system. Participant is able to verbalize types and ability to use techniques and skills needed for reducing stress and depression.  Initial Review & Psychosocial Screening: Initial Psych Review & Screening - 07/11/18 1201      Initial Review   Current issues with  None Identified      Family Dynamics   Good Support System?  Yes   Ilene has her husband and family for support     Barriers   Psychosocial barriers to participate in program  There are no identifiable barriers or psychosocial needs.      Screening Interventions   Interventions  Encouraged to exercise       Quality of Life Scores: Quality of Life - 07/11/18 0749      Quality of Life   Select  Quality of Life      Quality of Life Scores   Health/Function Pre  25.61 %    Socioeconomic Pre  28.21 %    Psych/Spiritual Pre  29.64 %    Family Pre  30 %    GLOBAL Pre  27.68 %      Scores of 19 and below usually indicate a poorer quality of life in these areas.  A difference of  2-3 points is a clinically meaningful difference.  A difference of 2-3 points in the total score of the Quality of Life Index has been associated with significant improvement in overall quality of life, self-image, physical symptoms, and general health in studies assessing change in quality of life.  PHQ-9: Recent Review Flowsheet Data    Depression screen [facility] 2/9 03/19/2017   Decreased Interest 0   Down, Depressed, Hopeless 0   PHQ - 2 Score 0     Interpretation of Total Score  Total Score Depression  Severity:  1-4 = Minimal depression, 5-9 = Mild depression, 10-14 = Moderate depression, 15-19 = Moderately severe depression, 20-27 = Severe depression   Psychosocial Evaluation and Intervention:   Psychosocial Re-Evaluation:   Psychosocial Discharge (Final Psychosocial Re-Evaluation):   Vocational Rehabilitation: Provide vocational rehab assistance to qualifying candidates.   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 Participant will state understanding/return demonstration of topics presented.  Learning Barriers/Preferences: Learning Barriers/Preferences - 07/11/18 1031      Learning Barriers/Preferences   Learning Barriers  Sight   Cataracts   Learning Preferences  Audio;Video;Skilled Demonstration;Verbal Instruction;Pictoral       Education Topics: Count Your Pulse:  -Group instruction provided by verbal instruction, demonstration, patient participation and written materials to support subject.  Instructors address importance of being able to find your pulse and how to count your pulse when at home without a heart monitor.  Patients get hands on experience counting their pulse with staff help and individually.   Heart Attack, Angina, and Risk Factor Modification:  -Group instruction provided by verbal instruction, video, and written materials to support  subject.  Instructors address signs and symptoms of angina and heart attacks.    Also discuss risk factors for heart disease and how to make changes to improve heart health risk factors.   Functional Fitness:  -Group instruction provided by verbal instruction, demonstration, patient  participation, and written materials to support subject.  Instructors address safety measures for doing things around the house.  Discuss how to get up and down off the floor, how to pick things up properly, how to safely get out of a chair without assistance, and balance training.   Meditation and Mindfulness:  -Group instruction provided by verbal instruction, patient participation, and written materials to support subject.  Instructor addresses importance of mindfulness and meditation practice to help reduce stress and improve awareness.  Instructor also leads participants through a meditation exercise.    Stretching for Flexibility and Mobility:  -Group instruction provided by verbal instruction, patient participation, and written materials to support subject.  Instructors lead participants through series of stretches that are designed to increase flexibility thus improving mobility.  These stretches are additional exercise for major muscle groups that are typically performed during regular warm up and cool down.   Hands Only CPR:  -Group verbal, video, and participation provides a basic overview of AHA guidelines for community CPR. Role-play of emergencies allow participants the opportunity to practice calling for help and chest compression technique with discussion of AED use.   Hypertension: -Group verbal and written instruction that provides a basic overview of hypertension including the most recent diagnostic guidelines, risk factor reduction with self-care instructions and medication management.    Nutrition I class: Heart Healthy Eating:  -Group instruction provided by PowerPoint slides, verbal discussion, and written materials to support subject matter. The instructor gives an explanation and review of the Therapeutic Lifestyle Changes diet recommendations, which includes a discussion on lipid goals, dietary fat, sodium, fiber, plant stanol/sterol esters, sugar, and the components of  a well-balanced, healthy diet.   Nutrition II class: Lifestyle Skills:  -Group instruction provided by PowerPoint slides, verbal discussion, and written materials to support subject matter. The instructor gives an explanation and review of label reading, grocery shopping for heart health, heart healthy recipe modifications, and ways to make healthier choices when eating out.   Diabetes Question & Answer:  -Group instruction provided by PowerPoint slides, verbal discussion, and written materials to support subject matter. The instructor gives an explanation and review of diabetes co-morbidities, pre- and post-prandial blood glucose goals, pre-exercise blood glucose goals, signs, symptoms, and treatment of hypoglycemia and hyperglycemia, and foot care basics.   Diabetes Blitz:  -Group instruction provided by PowerPoint slides, verbal discussion, and written materials to support subject matter. The instructor gives an explanation and review of the physiology behind type 1 and type 2 diabetes, diabetes medications and rational behind using different medications, pre- and post-prandial blood glucose recommendations and Hemoglobin A1c goals, diabetes diet, and exercise including blood glucose guidelines for exercising safely.    Portion Distortion:  -Group instruction provided by PowerPoint slides, verbal discussion, written materials, and food models to support subject matter. The instructor gives an explanation of serving size versus portion size, changes in portions sizes over the last 20 years, and what consists of a serving from each food group.   Stress Management:  -Group instruction provided by verbal instruction, video, and written materials to support subject matter.  Instructors review role of stress in heart disease and how to cope with stress positively.     Exercising  on Your Own:  -Group instruction provided by verbal instruction, power point, and written materials to support  subject.  Instructors discuss benefits of exercise, components of exercise, frequency and intensity of exercise, and end points for exercise.  Also discuss use of nitroglycerin and activating EMS.  Review options of places to exercise outside of rehab.  Review guidelines for sex with heart disease.   Cardiac Drugs I:  -Group instruction provided by verbal instruction and written materials to support subject.  Instructor reviews cardiac drug classes: antiplatelets, anticoagulants, beta blockers, and statins.  Instructor discusses reasons, side effects, and lifestyle considerations for each drug class.   Cardiac Drugs II:  -Group instruction provided by verbal instruction and written materials to support subject.  Instructor reviews cardiac drug classes: angiotensin converting enzyme inhibitors (ACE-I), angiotensin II receptor blockers (ARBs), nitrates, and calcium channel blockers.  Instructor discusses reasons, side effects, and lifestyle considerations for each drug class.   Anatomy and Physiology of the Circulatory System:  Group verbal and written instruction and models provide basic cardiac anatomy and physiology, with the coronary electrical and arterial systems. Review of: AMI, Angina, Valve disease, Heart Failure, Peripheral Artery Disease, Cardiac Arrhythmia, Pacemakers, and the ICD.   Other Education:  -Group or individual verbal, written, or video instructions that support the educational goals of the cardiac rehab program.   Holiday Eating Survival Tips:  -Group instruction provided by PowerPoint slides, verbal discussion, and written materials to support subject matter. The instructor gives patients tips, tricks, and techniques to help them not only survive but enjoy the holidays despite the onslaught of food that accompanies the holidays.   Knowledge Questionnaire Score: Knowledge Questionnaire Score - 07/11/18 0749      Knowledge Questionnaire Score   Pre Score  18/24        Core Components/Risk Factors/Patient Goals at Admission: Personal Goals and Risk Factors at Admission - 07/11/18 1032      Core Components/Risk Factors/Patient Goals on Admission    Weight Management  Yes;Obesity;Weight Maintenance;Weight Loss    Intervention  Weight Management: Develop a combined nutrition and exercise program designed to reach desired caloric intake, while maintaining appropriate intake of nutrient and fiber, sodium and fats, and appropriate energy expenditure required for the weight goal.;Weight Management: Provide education and appropriate resources to help participant work on and attain dietary goals.;Weight Management/Obesity: Establish reasonable short term and long term weight goals.;Obesity: Provide education and appropriate resources to help participant work on and attain dietary goals.    Admit Weight  164 lb 7.4 oz (74.6 kg)    Goal Weight: Short Term  154 lb (69.9 kg)    Expected Outcomes  Short Term: Continue to assess and modify interventions until short term weight is achieved;Long Term: Adherence to nutrition and physical activity/exercise program aimed toward attainment of established weight goal;Weight Maintenance: Understanding of the daily nutrition guidelines, which includes 25-35% calories from fat, 7% or less cal from saturated fats, less than 200mg  cholesterol, less than 1.5gm of sodium, & 5 or more servings of fruits and vegetables daily;Weight Loss: Understanding of general recommendations for a balanced deficit meal plan, which promotes 1-2 lb weight loss per week and includes a negative energy balance of [id number] kcal/d;Understanding recommendations for meals to include 15-35% energy as protein, 25-35% energy from fat, 35-60% energy from carbohydrates, less than 200mg  of dietary cholesterol, 20-35 gm of total fiber daily;Understanding of distribution of calorie intake throughout the day with the consumption of 4-5 meals/snacks    Diabetes  Yes     Intervention  Provide education about signs/symptoms and action to take for hypo/hyperglycemia.;Provide education about proper nutrition, including hydration, and aerobic/resistive exercise prescription along with prescribed medications to achieve blood glucose in normal ranges: Fasting glucose 65-99 mg/dL    Expected Outcomes  Short Term: Participant verbalizes understanding of the signs/symptoms and immediate care of hyper/hypoglycemia, proper foot care and importance of medication, aerobic/resistive exercise and nutrition plan for blood glucose control.;Long Term: Attainment of HbA1C < 7%.    Hypertension  Yes    Intervention  Provide education on lifestyle modifcations including regular physical activity/exercise, weight management, moderate sodium restriction and increased consumption of fresh fruit, vegetables, and low fat dairy, alcohol moderation, and smoking cessation.;Monitor prescription use compliance.    Expected Outcomes  Short Term: Continued assessment and intervention until BP is < 140/11mm HG in hypertensive participants. < 130/59mm HG in hypertensive participants with diabetes, heart failure or chronic kidney disease.;Long Term: Maintenance of blood pressure at goal levels.    Lipids  Yes    Intervention  Provide education and support for participant on nutrition & aerobic/resistive exercise along with prescribed medications to achieve LDL 70mg , HDL >40mg .    Expected Outcomes  Short Term: Participant states understanding of desired cholesterol values and is compliant with medications prescribed. Participant is following exercise prescription and nutrition guidelines.;Long Term: Cholesterol controlled with medications as prescribed, with individualized exercise RX and with personalized nutrition plan. Value goals: LDL < 70mg , HDL > 40 mg.    Stress  Yes    Intervention  Offer individual and/or small group education and counseling on adjustment to heart disease, stress management and  health-related lifestyle change. Teach and support self-help strategies.;Refer participants experiencing significant psychosocial distress to appropriate mental health specialists for further evaluation and treatment. When possible, include family members and significant others in education/counseling sessions.    Expected Outcomes  Short Term: Participant demonstrates changes in health-related behavior, relaxation and other stress management skills, ability to obtain effective social support, and compliance with psychotropic medications if prescribed.;Long Term: Emotional wellbeing is indicated by absence of clinically significant psychosocial distress or social isolation.       Core Components/Risk Factors/Patient Goals Review:    Core Components/Risk Factors/Patient Goals at Discharge (Final Review):    ITP Comments: ITP Comments    Row Name 07/11/18 0750           ITP Comments  Dr. Fransico Him, Medical Director           Comments: Smt attended orientation from [phone] to 0909 to review rules and guidelines for program. Completed 6 minute walk test, Intitial ITP, and exercise prescription.  VSS. Telemetry-Sinus Rhythm.  Asymptomatic.Barnet Pall, RN,BSN 07/11/2018 12:14 PM

## 2018-07-15 ENCOUNTER — Encounter (HOSPITAL_COMMUNITY): Payer: Medicare Other

## 2018-07-17 ENCOUNTER — Encounter (HOSPITAL_COMMUNITY)
Admission: RE | Admit: 2018-07-17 | Discharge: 2018-07-17 | Disposition: A | Discharge status: Home / Self Care | Payer: Medicare Other | Source: Ambulatory Visit | Attending: Cardiology | Admitting: Cardiology

## 2018-07-17 DIAGNOSIS — Z955 Presence of coronary angioplasty implant and graft: Secondary | ICD-10-CM | POA: Diagnosis not present

## 2018-07-17 LAB — GLUCOSE, CAPILLARY
Glucose-Capillary: 165 mg/dL — ABNORMAL HIGH (ref 70–99)
Glucose-Capillary: 129 mg/dL — ABNORMAL HIGH (ref 70–99)

## 2018-07-17 NOTE — Progress Notes (Signed)
Daily Session Note  Patient Details  Name: Joanna [name] MRN: [id number] Date of Birth: 03/04/1951 Referring Provider:     CARDIAC REHAB PHASE II ORIENTATION from 07/11/2018 in [facility]  Referring Provider  Dr. Skains      Encounter Date: 07/17/2018  Check In: Session Check In - 07/17/18 0720      Check-In   Supervising physician immediately available to respond to emergencies  [facility] immediately available    Physician(s)  Dr. Jennifer Choi    Location  [facility]    Staff Present  Dalton Fletcher, MS, Exercise Physiologist;Tara Everett, RN, BSN;Tyara Nevels, MS,ACSM CEP, Exercise Physiologist    Medication changes reported      No    Fall or balance concerns reported     No    Tobacco Cessation  No Change    Warm-up and Cool-down  Performed as group-led instruction    Resistance Training Performed  No    VAD Patient?  No    PAD/SET Patient?  No      Pain Assessment   Currently in Pain?  No/denies    Multiple Pain Sites  No       Capillary Blood Glucose: Results for orders placed or performed during the hospital encounter of 07/17/18 (from the past 24 hour(s))  Glucose, capillary     Status: Abnormal   Collection Time: 07/17/18  6:55 AM  Result Value Ref Range   Glucose-Capillary 165 (H) 70 - 99 mg/dL  Glucose, capillary     Status: Abnormal   Collection Time: 07/17/18  7:35 AM  Result Value Ref Range   Glucose-Capillary 129 (H) 70 - 99 mg/dL      Social History   Tobacco Use  Smoking Status Never Smoker  Smokeless Tobacco Never Used    Goals Met:  Exercise tolerated well  Goals Unmet:  Not Applicable  Comments: Pt started cardiac rehab today.  Pt tolerated light exercise without difficulty. VSS, telemetry-SR, asymptomatic.  Medication list reconciled. Pt denies barriers to medicaiton compliance.  PSYCHOSOCIAL ASSESSMENT:  PHQ-0. Pt exhibits positive coping skills, hopeful outlook with supportive  family. No psychosocial needs identified at this time, no psychosocial interventions necessary.  Pt oriented to exercise equipment and routine.    Understanding verbalized.    Dr. Traci Turner is Medical Director for Cardiac Rehab at [facility]. 

## 2018-07-18 ENCOUNTER — Other Ambulatory Visit: Payer: Self-pay

## 2018-07-18 ENCOUNTER — Other Ambulatory Visit (INDEPENDENT_AMBULATORY_CARE_PROVIDER_SITE_OTHER): Payer: Medicare Other

## 2018-07-18 DIAGNOSIS — E063 Autoimmune thyroiditis: Secondary | ICD-10-CM

## 2018-07-18 DIAGNOSIS — E119 Type 2 diabetes mellitus without complications: Secondary | ICD-10-CM

## 2018-07-18 LAB — COMPREHENSIVE METABOLIC PANEL
ALT: 27 U/L (ref 0–35)
AST: 18 U/L (ref 0–37)
Albumin: 4.5 g/dL (ref 3.5–5.2)
Alkaline Phosphatase: 69 U/L (ref 39–117)
BUN: 17 mg/dL (ref 6–23)
CO2: 29 mEq/L (ref 19–32)
Calcium: 9.9 mg/dL (ref 8.4–10.5)
Chloride: 101 mEq/L (ref 96–112)
Creatinine, Ser: 0.74 mg/dL (ref 0.40–1.20)
GFR: 78.56 mL/min (ref >60.00)
GLUCOSE: 101 mg/dL — AB (ref 70–99)
Potassium: 5 mEq/L (ref 3.5–5.1)
Sodium: 140 mEq/L (ref 135–145)
Total Bilirubin: 0.5 mg/dL (ref 0.2–1.2)
Total Protein: 7.2 g/dL (ref 6.0–8.3)

## 2018-07-18 LAB — TSH: TSH: 2.52 u[IU]/mL (ref 0.35–4.50)

## 2018-07-18 LAB — T4, FREE: FREE T4: 0.85 ng/dL (ref 0.60–1.60)

## 2018-07-18 LAB — HEMOGLOBIN A1C: Hgb A1c MFr Bld: 5.9 % (ref 4.6–6.5)

## 2018-07-19 ENCOUNTER — Encounter (HOSPITAL_COMMUNITY)
Admission: RE | Admit: 2018-07-19 | Discharge: 2018-07-19 | Disposition: A | Discharge status: Home / Self Care | Payer: Medicare Other | Source: Ambulatory Visit | Attending: Cardiology | Admitting: Cardiology

## 2018-07-19 ENCOUNTER — Ambulatory Visit (HOSPITAL_COMMUNITY): Payer: Medicare Other

## 2018-07-19 DIAGNOSIS — Z955 Presence of coronary angioplasty implant and graft: Secondary | ICD-10-CM | POA: Diagnosis not present

## 2018-07-19 LAB — GLUCOSE, CAPILLARY
Glucose-Capillary: 181 mg/dL — ABNORMAL HIGH (ref 70–99)
Glucose-Capillary: 113 mg/dL — ABNORMAL HIGH (ref 70–99)

## 2018-07-22 ENCOUNTER — Other Ambulatory Visit: Payer: Self-pay

## 2018-07-22 ENCOUNTER — Encounter: Payer: Self-pay | Admitting: Endocrinology

## 2018-07-22 ENCOUNTER — Encounter (HOSPITAL_COMMUNITY)
Admission: RE | Admit: 2018-07-22 | Discharge: 2018-07-22 | Disposition: A | Discharge status: Home / Self Care | Payer: Medicare Other | Source: Ambulatory Visit | Attending: Cardiology | Admitting: Cardiology

## 2018-07-22 ENCOUNTER — Ambulatory Visit (INDEPENDENT_AMBULATORY_CARE_PROVIDER_SITE_OTHER): Payer: Medicare Other | Admitting: Endocrinology

## 2018-07-22 VITALS — BP 136/82 | HR 70 | Ht 64.0 in | Wt 165.8 lb

## 2018-07-22 DIAGNOSIS — E119 Type 2 diabetes mellitus without complications: Secondary | ICD-10-CM | POA: Diagnosis not present

## 2018-07-22 DIAGNOSIS — I2510 Atherosclerotic heart disease of native coronary artery without angina pectoris: Secondary | ICD-10-CM

## 2018-07-22 DIAGNOSIS — Z955 Presence of coronary angioplasty implant and graft: Secondary | ICD-10-CM | POA: Diagnosis not present

## 2018-07-22 DIAGNOSIS — E063 Autoimmune thyroiditis: Secondary | ICD-10-CM

## 2018-07-22 MED ORDER — DULAGLUTIDE 1.5 MG/0.5ML ~~LOC~~ SOAJ: SUBCUTANEOUS | 5 refills | Status: DC

## 2018-07-22 NOTE — Patient Instructions (Addendum)
Check cost of Victoza, Bydureon  Take 1/2 Glimeperide  Check about Jardiance

## 2018-07-22 NOTE — Progress Notes (Addendum)
Patient ID: Joanna [name], female   DOB: [birth year], 68 y.o.   MRN: [id number]           Reason for Appointment: Follow-up of diabetes  Referring physician: Deborra Medina  History of Present Illness:          Diagnosis: Type 2 diabetes mellitus, date of diagnosis: 2010?       Prior history:  On her initial consultation she was advised to start Tanzeum in addition to metformin for multiple benefits including long-term control; was started in 12/15.  Recent history:  A1c is 5.9 and has been improving        Non-insulin hypoglycemic drugs the patient is taking are: Metformin 1 g 2x  a day, Trulicity 1.5 mg weekly, Amaryl 1 mg at bedtime.     Glucose patterns and problems identified:  She again did not bring her blood sugar monitor  With taking 1 mg of Amaryl at night her sugars apparently are fairly good  She is concerned that her sugars are still about 130 fasting  Blood sugars may be as low as 77 but occasionally 80 late in the afternoon and she may occasionally feel shaky with this  Not clear if she is anxious doing blood sugars after evening meal  As before she is trying to cut back on high fat foods, supposed to be working with the dietitian in cardiac rehab now  She is again complaining about the cost of Trulicity and wants to stop this    Side effects from medications have been: none Compliance with the medical regimen: fair    Glucose monitoring:  done once or twice a day         Glucometer: One Touch ultra mini,    Blood Glucose readings  from recall As above     Self-care: The diet that the patient has been following is: tries to limit fat intake.      Meals: 3 meals per day. Breakfast is eggs, , sometimes cottage cheese   Lunch is a sandwich or soup.  Snacks: Cottage cheese and fruit, no sweet drinks juices          Exercise: Just starting cardiac rehab      Dietician visit, most recent: never     CDE visit: 1/16            Weight history:    Wt Readings from  Last 3 Encounters:  07/22/18 165 lb 12.8 oz (75.2 kg)  07/11/18 164 lb 7.4 oz (74.6 kg)  05/23/18 166 lb 12.8 oz (75.7 kg)    Glycemic control:   Lab Results  Component Value Date   HGBA1C 5.9 07/18/2018   HGBA1C 5.5 05/08/2018   HGBA1C 6.2 04/11/2018   Lab Results  Component Value Date   MICROALBUR 5.3 (H) 04/11/2018   LDLCALC 60 06/10/2018   CREATININE 0.74 07/18/2018   Lab Results  Component Value Date   FRUCTOSAMINE 247 06/02/2016   FRUCTOSAMINE 274 (H) 07/16/2014    Past diabetes history:  For several years prior to her diagnosis she had been complaining of her feet burning Her blood sugars were mildly increased initially at diagnosis and she did try to control it with diet alone and exercise without getting them back to normal.  Her A1c had continue to be around 7% with the lowest one 6.7  About 2012 she was started on metformin probably when her A1c was 7.3.   In 2015 her blood sugars had been higher with A1c  8.1%  HYPERLIPIDEMIA: Discussed in review of systems    Allergies as of 07/22/2018      Reactions   Crestor [rosuvastatin]    REACTION: N/V and heartburn   Lipitor [atorvastatin]    REACTION: Hot flashes and flu-like symptoms   Pravastatin Sodium    REACTION: Neck swelling and pain in her shoulders and arms.   Sulfonamide Derivatives    Rxn Unknown   Zetia [ezetimibe]    GI upset   Zocor [simvastatin] Other (See Comments)   REACTION: GI.Marland Kitchen Pt unknown of severity   Niacin    REACTION: n/v      Medication List       Accurate as of July 22, 2018  3:47 PM. Always use your most recent med list.        amLODipine 10 MG tablet Commonly known as:  NORVASC Take 1 tablet (10 mg total) by mouth daily.   aspirin EC 81 MG tablet Take 1 tablet (81 mg total) by mouth daily.   carvedilol 6.25 MG tablet Commonly known as:  COREG Take 1 tablet (6.25 mg total) by mouth 2 (two) times daily.   cholecalciferol 1000 units tablet Commonly known as:  VITAMIN  D Take 2,000 Units by mouth daily.   Dulaglutide 1.5 MG/0.5ML Sopn Commonly known as:  TRULICITY INJECT CONTENTS OF PEN WEEKLY   glimepiride 1 MG tablet Commonly known as:  AMARYL TAKE 1 TABLET (1 MG TOTAL) BY MOUTH AT BEDTIME.   glucose blood test strip Commonly known as:  ONE TOUCH ULTRA TEST USE TO CHECK BLOOD SUGAR TWICE DAILY  Dx:E11.65   hydrocortisone 2.5 % rectal cream Commonly known as:  ANUSOL-HC Use small amount in the perianal area 3 times daily as needed.   levothyroxine 112 MCG tablet Commonly known as:  SYNTHROID Take 1 tablet (112 mcg total) by mouth daily before breakfast.   SYNTHROID 100 MCG tablet Generic drug:  levothyroxine TAKE 1 TABLET BY MOUTH DAILY   Magnesium 250 MG Tabs Take 1 tablet by mouth 2 (two) times daily.   metFORMIN 1000 MG tablet Commonly known as:  GLUCOPHAGE TAKE 1 TABLET (1,000 MG TOTAL) BY MOUTH 2 (TWO) TIMES DAILY.   nitroGLYCERIN 0.4 MG SL tablet Commonly known as:  NITROSTAT Place 1 tablet (0.4 mg total) under the tongue every 5 (five) minutes x 3 doses as needed for chest pain.   ramipril 10 MG capsule Commonly known as:  ALTACE Take 1 capsule (10 mg total) by mouth daily.   rosuvastatin 5 MG tablet Commonly known as:  CRESTOR Take 0.5 tablets (2.5 mg total) by mouth daily.   ticagrelor 90 MG Tabs tablet Commonly known as:  BRILINTA Take 1 tablet (90 mg total) by mouth 2 (two) times daily.       Allergies:  Allergies  Allergen Reactions  . Crestor [Rosuvastatin]     REACTION: N/V and heartburn  . Lipitor [Atorvastatin]     REACTION: Hot flashes and flu-like symptoms  . Pravastatin Sodium     REACTION: Neck swelling and pain in her shoulders and arms.  . Sulfonamide Derivatives     Rxn Unknown  . Zetia [Ezetimibe]     GI upset  . Zocor [Simvastatin] Other (See Comments)    REACTION: GI.Marland Kitchen Pt unknown of severity  . Niacin     REACTION: n/v    Past Medical History:  Diagnosis Date  . Abnormal liver  function tests   . Adenomatous colon polyp   . Aortic stenosis,  moderate 05/09/2018  . CAD (coronary artery disease) 05/09/2018   a. LHC 05/09/18: DES to mid/dist LAD, mod AS  . Cerebrovascular disease    a. CT 04/2016 -calcific plaque at the origin LEFT vertebral contributing to severe stenosis.  . Diverticulitis of colon   . Fatty liver    a. mild fatty liver infiltration by CT 2014.  Marland Kitchen Foot injury 2008   Left foot/leg with ?? torn muscle -PROBLEM HAS RESOLVED  . Hand pain 05/2007   steroid injections--RESOLVED  . Heart murmur   . History of kidney stones   . Hx of renal calculi    LEFT  . Hyperlipidemia   . Hypertension   . Hypothyroidism    pt states hx of thyroid nodules  . IBS (irritable bowel syndrome)   . Internal hemorrhoids   . Melanoma ([place]) 1980s   mid upper stomach (05/08/2018)  . OSA on CPAP     SETTING IS 14 (05/08/2018)  . Statin intolerance   . Stroke [facility])    a. right thalamic infarct in 04/2016 felt by neuro to be due to small vessel disease.  . Type II diabetes mellitus ([place])     Past Surgical History:  Procedure Laterality Date  . CARDIAC CATHETERIZATION    . CATARACT EXTRACTION W/ INTRAOCULAR LENS  IMPLANT, BILATERAL Bilateral 08/2016  . COLONOSCOPY W/ BIOPSIES AND POLYPECTOMY  05/19/2005   adenomatous polyps  . CORONARY STENT INTERVENTION N/A 05/09/2018   Procedure: CORONARY STENT INTERVENTION;  Surgeon: Nelva Bush, MD;  Location: [facility];  Service: Cardiovascular;  Laterality: N/A;  . LEFT HEART CATH AND CORONARY ANGIOGRAPHY N/A 05/09/2018   Procedure: LEFT HEART CATH AND CORONARY ANGIOGRAPHY;  Surgeon: Nelva Bush, MD;  Location: [facility];  Service: Cardiovascular;  Laterality: N/A;  . MELANOMA EXCISION  1980s   mid upper stomach  . NEPHROLITHOTOMY Left 01/27/2013   Procedure: NEPHROLITHOTOMY PERCUTANEOUS;  Surgeon: Dutch Gray, MD;  Location: [facility];  Service: Urology;  Laterality: Left;  . TUBAL LIGATION    .  WISDOM TEETH EXTRACTED      Family History  Problem Relation Age of Onset  . Hyperlipidemia Mother   . Neurofibromatosis Father   . Thyroid disease Daughter   . Hyperlipidemia Sister   . Hyperlipidemia Brother   . Diabetes Neg Hx   . Stroke Neg Hx   . Colon cancer Neg Hx   . Esophageal cancer Neg Hx   . Pancreatic cancer Neg Hx   . Liver cancer Neg Hx     Social History:  reports that she has never smoked. She has never used smokeless tobacco. She reports that she does not drink alcohol or use drugs.     Review of Systems        LIPIDS: she has long-standing severe hypercholesterolemia and has been intolerant to all statin drugs with various reactions Lipitor caused flulike symptoms, abdominal bloating and hot flashes, pravastatin caused neck swelling and upper body pain, Zocor caused GI upset.  Was not able to tolerate Zetia as she thinks it upset his stomach She did not want to continue Repatha because of cost  LDL particle number previously over 2500 with ideal levels of below 1000  Currently taking 2.5 mg Crestor every day and followed by cardiology PA, No side effects with this currently LDL is better than expected  Also taking small doses of OTC fish oil      Lab Results  Component Value Date   CHOL  120 06/10/2018   HDL 34 (L) 06/10/2018   LDLCALC 60 06/10/2018   LDLDIRECT 209 (H) 03/19/2018   TRIG 128 06/10/2018   CHOLHDL 3.5 06/10/2018               Thyroid:    She has had hypothyroidism for about 10 years and initially had a goiter also; highest TSH probably about 9.2 Currently on 100 g levothyroxine since 10/18 She takes this regularly No consistent symptoms of fatigue recently  Her TSH appears to be higher than normal  Last TSH:    Lab Results  Component Value Date   TSH 2.52 07/18/2018   TSH 1.908 05/08/2018   TSH 4.87 (H) 04/11/2018   FREET4 0.85 07/18/2018   FREET4 1.15 05/08/2018   FREET4 0.82 04/11/2018        The blood pressure is  being treated with 5 mg amlodipine and 10 mg ramipril along with metoprolol This is also followed by cardiologist She tends to have high reading on the right side   BP Readings from Last 3 Encounters:  07/22/18 136/82  07/11/18 110/64  05/23/18 128/78       LABS:  Hospital Outpatient Visit on 07/19/2018  Component Date Value Ref Range Status  . Glucose-Capillary 07/19/2018 113* 70 - 99 mg/dL Final  . Glucose-Capillary 07/19/2018 181* 70 - 99 mg/dL Final  Lab on 07/18/2018  Component Date Value Ref Range Status  . Free T4 07/18/2018 0.85  0.60 - 1.60 ng/dL Final   Comment: Specimens from patients who are undergoing biotin therapy and /or ingesting biotin supplements may contain high levels of biotin.  The higher biotin concentration in these specimens interferes with this Free T4 assay.  Specimens that contain high levels  of biotin may cause false high results for this Free T4 assay.  Please interpret results in light of the total clinical presentation of the patient.    Marland Kitchen TSH 07/18/2018 2.52  0.35 - 4.50 uIU/mL Final  . Sodium 07/18/2018 140  135 - 145 mEq/L Final  . Potassium 07/18/2018 5.0  3.5 - 5.1 mEq/L Final  . Chloride 07/18/2018 101  96 - 112 mEq/L Final  . CO2 07/18/2018 29  19 - 32 mEq/L Final  . Glucose, Bld 07/18/2018 101* 70 - 99 mg/dL Final  . BUN 07/18/2018 17  6 - 23 mg/dL Final  . Creatinine, Ser 07/18/2018 0.74  0.40 - 1.20 mg/dL Final  . Total Bilirubin 07/18/2018 0.5  0.2 - 1.2 mg/dL Final  . Alkaline Phosphatase 07/18/2018 69  39 - 117 U/L Final  . AST 07/18/2018 18  0 - 37 U/L Final  . ALT 07/18/2018 27  0 - 35 U/L Final  . Total Protein 07/18/2018 7.2  6.0 - 8.3 g/dL Final  . Albumin 07/18/2018 4.5  3.5 - 5.2 g/dL Final  . Calcium 07/18/2018 9.9  8.4 - 10.5 mg/dL Final  . GFR 07/18/2018 78.56  >60.00 mL/min Final  . Hgb A1c MFr Bld 07/18/2018 5.9  4.6 - 6.5 % Final   Glycemic Control Guidelines for People with Diabetes:Non Diabetic:  <6%Goal of  Therapy: <7%Additional Action Suggested:  >8%   Hospital Outpatient Visit on 07/17/2018  Component Date Value Ref Range Status  . Glucose-Capillary 07/17/2018 129* 70 - 99 mg/dL Final  . Glucose-Capillary 07/17/2018 165* 70 - 99 mg/dL Final    Physical Examination:  BP 136/82 (BP Location: Right Arm, Patient Position: Sitting, Cuff Size: Normal)   Pulse 70   Ht 5\' 4"  (  1.626 m)   Wt 165 lb 12.8 oz (75.2 kg)   SpO2 98%   BMI 28.46 kg/m            ASSESSMENT/PLAN:   Diabetes type 2 with BMI 28  See history of present illness for details of her current blood sugar patterns and management  A1c is improved at 5.9  She is concerned about the cost of her brand-new medications However it is unlikely she will get as good control without Trulicity and this does have cardiovascular benefits long-term Currently may be getting low normal blood sugars during the day with Amaryl and likely requires less because of her improved blood sugar control She will also be starting more regular exercise   Recommendations:  She will continue Amaryl but take only half of 1 mg at bedtime  She needs to bring her meter for review on each visit  Regular exercise  To check readings after dinner and discussed 2-hour blood sugar targets  Given coupon for free month supply of Trulicity  Follow-up in 4 months   Familial hypercholesterolemia with LDL again over 200 She will continue with cardiology lipid clinic   HYPERTENSION without microalbuminuria: Better now and managed by cardiologist  HYPOTHYROIDISM: Her TSH is back to normal with 112 mcg    There are no Patient Instructions on file for this visit.    Elayne Snare 07/22/2018, 3:47 PM   Note: This office note was prepared with Dragon voice recognition system technology. Any transcriptional errors that result from this process are unintentional.

## 2018-07-24 ENCOUNTER — Ambulatory Visit (HOSPITAL_COMMUNITY): Payer: Medicare Other

## 2018-07-24 ENCOUNTER — Encounter (HOSPITAL_COMMUNITY)
Admission: RE | Admit: 2018-07-24 | Discharge: 2018-07-24 | Disposition: A | Discharge status: Home / Self Care | Payer: Medicare Other | Source: Ambulatory Visit | Attending: Cardiology | Admitting: Cardiology

## 2018-07-24 DIAGNOSIS — Z955 Presence of coronary angioplasty implant and graft: Secondary | ICD-10-CM

## 2018-07-25 NOTE — Progress Notes (Signed)
Cardiac Individual Treatment Plan  Patient Details  Name: Joanna [name] MRN: [id number] Date of Birth: 12-02-1950 Referring Provider:     [facility] from 07/11/2018 in [place]  Referring Provider  Dr. Marlou Porch      Initial Encounter Date:    CARDIAC REHAB PHASE II ORIENTATION from 07/11/2018 in [facility]  Date  07/11/18      Visit Diagnosis: Status post coronary artery stent placement 05/09/18 DES LAD  Patient's Home Medications on Admission:  Current Outpatient Medications:  .  amLODipine (NORVASC) 10 MG tablet, Take 1 tablet (10 mg total) by mouth daily., Disp: 90 tablet, Rfl: 3 .  aspirin EC 81 MG tablet, Take 1 tablet (81 mg total) by mouth daily., Disp: 30 tablet, Rfl: 0 .  carvedilol (COREG) 6.25 MG tablet, Take 1 tablet (6.25 mg total) by mouth 2 (two) times daily., Disp: 180 tablet, Rfl: 3 .  cholecalciferol (VITAMIN D) 1000 units tablet, Take 2,000 Units by mouth daily., Disp: , Rfl:  .  Dulaglutide (TRULICITY) 1.5 WE/9.9BZ SOPN, INJECT CONTENTS OF PEN WEEKLY, Disp: 12 pen, Rfl: 5 .  glimepiride (AMARYL) 1 MG tablet, TAKE 1 TABLET (1 MG TOTAL) BY MOUTH AT BEDTIME., Disp: 30 tablet, Rfl: 1 .  glucose blood (ONE TOUCH ULTRA TEST) test strip, USE TO CHECK BLOOD SUGAR TWICE DAILY  Dx:E11.65, Disp: 100 each, Rfl: 3 .  hydrocortisone (ANUSOL-HC) 2.5 % rectal cream, Use small amount in the perianal area 3 times daily as needed., Disp: 30 g, Rfl: 1 .  levothyroxine (SYNTHROID) 112 MCG tablet, Take 1 tablet (112 mcg total) by mouth daily before breakfast., Disp: 90 tablet, Rfl: 0 .  Magnesium 250 MG TABS, Take 1 tablet by mouth 2 (two) times daily., Disp: , Rfl:  .  metFORMIN (GLUCOPHAGE) 1000 MG tablet, TAKE 1 TABLET (1,000 MG TOTAL) BY MOUTH 2 (TWO) TIMES DAILY. (Patient taking differently: Take 1,000 mg by mouth 2 (two) times daily with a meal. ), Disp: 180 tablet, Rfl: 3 .  nitroGLYCERIN  (NITROSTAT) 0.4 MG SL tablet, Place 1 tablet (0.4 mg total) under the tongue every 5 (five) minutes x 3 doses as needed for chest pain., Disp: 25 tablet, Rfl: 12 .  ramipril (ALTACE) 10 MG capsule, Take 1 capsule (10 mg total) by mouth daily., Disp: 90 capsule, Rfl: 3 .  rosuvastatin (CRESTOR) 5 MG tablet, Take 0.5 tablets (2.5 mg total) by mouth daily., Disp: 45 tablet, Rfl: 3 .  SYNTHROID 100 MCG tablet, TAKE 1 TABLET BY MOUTH DAILY, Disp: 90 tablet, Rfl: 3 .  ticagrelor (BRILINTA) 90 MG TABS tablet, Take 1 tablet (90 mg total) by mouth 2 (two) times daily., Disp: 180 tablet, Rfl: 3  Past Medical History: Past Medical History:  Diagnosis Date  . Abnormal liver function tests   . Adenomatous colon polyp   . Aortic stenosis, moderate 05/09/2018  . CAD (coronary artery disease) 05/09/2018   a. LHC 05/09/18: DES to mid/dist LAD, mod AS  . Cerebrovascular disease    a. CT 04/2016 -calcific plaque at the origin LEFT vertebral contributing to severe stenosis.  . Diverticulitis of colon   . Fatty liver    a. mild fatty liver infiltration by CT 2014.  [place] Kitchen Foot injury 2008   Left foot/leg with ?? torn muscle -PROBLEM HAS RESOLVED  . Hand pain 05/2007   steroid injections--RESOLVED  . Heart murmur   . History of kidney stones   .  Hx of renal calculi    LEFT  . Hyperlipidemia   . Hypertension   . Hypothyroidism    pt states hx of thyroid nodules  . IBS (irritable bowel syndrome)   . Internal hemorrhoids   . Melanoma ([place]) 1980s   mid upper stomach (05/08/2018)  . OSA on CPAP     SETTING IS 14 (05/08/2018)  . Statin intolerance   . Stroke [facility])    a. right thalamic infarct in 04/2016 felt by neuro to be due to small vessel disease.  . Type II diabetes mellitus ([place])     Tobacco Use: Social History   Tobacco Use  Smoking Status Never Smoker  Smokeless Tobacco Never Used    Labs: Recent Review Flowsheet Data    Labs for ITP Cardiac and Pulmonary Rehab Latest Ref Rng & Units  04/11/2018 05/08/2018 05/09/2018 06/10/2018 07/18/2018   Cholestrol 100 - 199 mg/dL - - 195 120 -   LDLCALC 0 - 99 mg/dL - - 128(H) 60 -   LDLDIRECT 0 - 99 mg/dL - - - - -   HDL >39 mg/dL - - 36(L) 34(L) -   Trlycerides 0 - 149 mg/dL - - 153(H) 128 -   Hemoglobin A1c 4.6 - 6.5 % 6.2 5.5 - - 5.9      Capillary Blood Glucose: Lab Results  Component Value Date   GLUCAP 113 (H) 07/19/2018   GLUCAP 181 (H) 07/19/2018   GLUCAP 129 (H) 07/17/2018   GLUCAP 165 (H) 07/17/2018   GLUCAP 130 (H) 05/10/2018     Exercise Target Goals: Exercise Program Goal: Individual exercise prescription set using results from initial 6 min walk test and THRR while considering  patient's activity barriers and safety.   Exercise Prescription Goal: Initial exercise prescription builds to 30-45 minutes a day of aerobic activity, 2-3 days per week.  Home exercise guidelines will be given to patient during program as part of exercise prescription that the participant will acknowledge.  Activity Barriers & Risk Stratification: Activity Barriers & Cardiac Risk Stratification - 07/11/18 1026      Activity Barriers & Cardiac Risk Stratification   Activity Barriers  None    Cardiac Risk Stratification  Moderate       6 Minute Walk: 6 Minute Walk    Row Name 07/11/18 1023         6 Minute Walk   Phase  Initial     Distance  1800 feet     Walk Time  6 minutes     MPH  3.4     METS  3.8     RPE  11     VO2 Peak  13.3     Symptoms  No     Resting HR  80 bpm     Resting BP  110/64     Resting Oxygen Saturation   98 %     Exercise Oxygen Saturation  during 6 min walk  97 %     Max Ex. HR  99 bpm     Max Ex. BP  112/66     2 Minute Post BP  120/70        Oxygen Initial Assessment:   Oxygen Re-Evaluation:   Oxygen Discharge (Final Oxygen Re-Evaluation):   Initial Exercise Prescription: Initial Exercise Prescription - 07/11/18 1000      Date of Initial Exercise RX and Referring Provider    Date  07/11/18    Referring Provider  Dr. Marlou Porch  Expected Discharge Date  10/18/18      Treadmill   MPH  3.6    Grade  1    Minutes  10      Recumbant Bike   Level  2    Watts  45    Minutes  10    METs  3.89      NuStep   Level  3    SPM  85    Minutes  10    METs  3.6      Prescription Details   Frequency (times per week)  3    Duration  Progress to 30 minutes of continuous aerobic without signs/symptoms of physical distress      Intensity   THRR 40-80% of Max Heartrate  62-124    Ratings of Perceived Exertion  11-13      Progression   Progression  Continue to progress workloads to maintain intensity without signs/symptoms of physical distress.      Resistance Training   Training Prescription  Yes    Weight  4 lbs.     Reps  10-15       Perform Capillary Blood Glucose checks as needed.  Exercise Prescription Changes: Exercise Prescription Changes    Row Name 07/17/18 0900             Response to Exercise   Blood Pressure (Admit)  124/70       Blood Pressure (Exercise)  130/80       Blood Pressure (Exit)  102/70       Heart Rate (Admit)  75 bpm       Heart Rate (Exercise)  109 bpm       Heart Rate (Exit)  80 bpm       Rating of Perceived Exertion (Exercise)  12       Perceived Dyspnea (Exercise)  0       Symptoms  Chronic Ankle Pain       Comments  Pt oriented to exercise equipment       Duration  Progress to 30 minutes of  aerobic without signs/symptoms of physical distress       Intensity  THRR unchanged         Progression   Progression  Continue to progress workloads to maintain intensity without signs/symptoms of physical distress.       Average METs  3.3         Resistance Training   Training Prescription  No         Treadmill   MPH  2.6       Grade  1       Minutes  10       METs  3.35         Recumbant Bike   Level  2       Watts  45       Minutes  10       METs  3.98         NuStep   Level  3       SPM  75       Minutes   10       METs  2.7          Exercise Comments: Exercise Comments    Row Name 07/17/18 0912           Exercise Comments  Pt's first day of exercise. Pt responded well to exercise prescription. Pt did report  chronic ankle pain on Treadmill, decreased workload. Will continue to monitor and progress as tolerated.           Exercise Goals and Review: Exercise Goals    Row Name 07/11/18 1029             Exercise Goals   Increase Physical Activity  Yes       Intervention  Provide advice, education, support and counseling about physical activity/exercise needs.;Develop an individualized exercise prescription for aerobic and resistive training based on initial evaluation findings, risk stratification, comorbidities and participant's personal goals.       Expected Outcomes  Short Term: Attend rehab on a regular basis to increase amount of physical activity.       Increase Strength and Stamina  Yes       Intervention  Provide advice, education, support and counseling about physical activity/exercise needs.;Develop an individualized exercise prescription for aerobic and resistive training based on initial evaluation findings, risk stratification, comorbidities and participant's personal goals.       Expected Outcomes  Short Term: Increase workloads from initial exercise prescription for resistance, speed, and METs.       Able to understand and use rate of perceived exertion (RPE) scale  Yes       Intervention  Provide education and explanation on how to use RPE scale       Expected Outcomes  Short Term: Able to use RPE daily in rehab to express subjective intensity level;Long Term:  Able to use RPE to guide intensity level when exercising independently       Knowledge and understanding of Target Heart Rate Range (THRR)  Yes       Intervention  Provide education and explanation of THRR including how the numbers were predicted and where they are located for reference       Expected Outcomes   Short Term: Able to state/look up THRR;Long Term: Able to use THRR to govern intensity when exercising independently;Short Term: Able to use daily as guideline for intensity in rehab       Able to check pulse independently  Yes       Intervention  Provide education and demonstration on how to check pulse in carotid and radial arteries.;Review the importance of being able to check your own pulse for safety during independent exercise       Expected Outcomes  Short Term: Able to explain why pulse checking is important during independent exercise;Long Term: Able to check pulse independently and accurately       Understanding of Exercise Prescription  Yes       Intervention  Provide education, explanation, and written materials on patient's individual exercise prescription       Expected Outcomes  Short Term: Able to explain program exercise prescription;Long Term: Able to explain home exercise prescription to exercise independently          Exercise Goals Re-Evaluation :   Discharge Exercise Prescription (Final Exercise Prescription Changes): Exercise Prescription Changes - 07/17/18 0900      Response to Exercise   Blood Pressure (Admit)  124/70    Blood Pressure (Exercise)  130/80    Blood Pressure (Exit)  102/70    Heart Rate (Admit)  75 bpm    Heart Rate (Exercise)  109 bpm    Heart Rate (Exit)  80 bpm    Rating of Perceived Exertion (Exercise)  12    Perceived Dyspnea (Exercise)  0    Symptoms  Chronic Ankle Pain  Comments  Pt oriented to exercise equipment    Duration  Progress to 30 minutes of  aerobic without signs/symptoms of physical distress    Intensity  THRR unchanged      Progression   Progression  Continue to progress workloads to maintain intensity without signs/symptoms of physical distress.    Average METs  3.3      Resistance Training   Training Prescription  No      Treadmill   MPH  2.6    Grade  1    Minutes  10    METs  3.35      Recumbant Bike   Level   2    Watts  45    Minutes  10    METs  3.98      NuStep   Level  3    SPM  75    Minutes  10    METs  2.7       Nutrition:  Target Goals: Understanding of nutrition guidelines, daily intake of sodium 1500mg , cholesterol 200mg , calories 30% from fat and 7% or less from saturated fats, daily to have 5 or more servings of fruits and vegetables.  Biometrics: Pre Biometrics - 07/11/18 1030      Pre Biometrics   Height  5\' 4"  (1.626 m)    Weight  74.6 kg    Waist Circumference  38.5 inches    Hip Circumference  39 inches    Waist to Hip Ratio  0.99 %    BMI (Calculated)  28.22    Triceps Skinfold  10 mm    % Body Fat  35 %    Grip Strength  32 kg    Flexibility  13 in    Single Leg Stand  30 seconds        Nutrition Therapy Plan and Nutrition Goals: Nutrition Therapy & Goals - 07/11/18 1050      Nutrition Therapy   Diet  Heart healthy, carb modified      Personal Nutrition Goals   Nutrition Goal  Pt to identify and limit food sources of saturated fat, trans fat, refined carbohydrates and sodium    Personal Goal #2  Pt to identify food quantities necessary to achieve weight loss of 6-24 lb at graduation from cardiac rehab    Personal Goal #3  Pt to build a healthy plate including vegetables, fruits, whole grains, and low-fat dairy products in a heart healthy meal plan    Personal Goal #4  Pt to weigh and measure serving  sizes      Intervention Plan   Intervention  Prescribe, educate and counsel regarding individualized specific dietary modifications aiming towards targeted core components such as weight, hypertension, lipid management, diabetes, heart failure and other comorbidities.    Expected Outcomes  Short Term Goal: Understand basic principles of dietary content, such as calories, fat, sodium, cholesterol and nutrients.;Long Term Goal: Adherence to prescribed nutrition plan.       Nutrition Assessments: Nutrition Assessments - 07/11/18 1056      Rate Your  Plate Scores   Pre Score  23       Nutrition Goals Re-Evaluation: Nutrition Goals Re-Evaluation    [place] Name 07/11/18 1050             Goals   Current Weight  164 lb 7.4 oz (74.6 kg)          Nutrition Goals Re-Evaluation: Nutrition Goals Re-Evaluation    [place] Name 07/11/18  1050             Goals   Current Weight  164 lb 7.4 oz (74.6 kg)          Nutrition Goals Discharge (Final Nutrition Goals Re-Evaluation): Nutrition Goals Re-Evaluation - 07/11/18 1050      Goals   Current Weight  164 lb 7.4 oz (74.6 kg)       Psychosocial: Target Goals: Acknowledge presence or absence of significant depression and/or stress, maximize coping skills, provide positive support system. Participant is able to verbalize types and ability to use techniques and skills needed for reducing stress and depression.  Initial Review & Psychosocial Screening: Initial Psych Review & Screening - 07/11/18 1201      Initial Review   Current issues with  None Identified      Family Dynamics   Good Support System?  Yes   Tanika has her husband and family for support     Barriers   Psychosocial barriers to participate in program  There are no identifiable barriers or psychosocial needs.      Screening Interventions   Interventions  Encouraged to exercise       Quality of Life Scores: Quality of Life - 07/11/18 0749      Quality of Life   Select  Quality of Life      Quality of Life Scores   Health/Function Pre  25.61 %    Socioeconomic Pre  28.21 %    Psych/Spiritual Pre  29.64 %    Family Pre  30 %    GLOBAL Pre  27.68 %      Scores of 19 and below usually indicate a poorer quality of life in these areas.  A difference of  2-3 points is a clinically meaningful difference.  A difference of 2-3 points in the total score of the Quality of Life Index has been associated with significant improvement in overall quality of life, self-image, physical symptoms, and general health in studies  assessing change in quality of life.  PHQ-9: Recent Review Flowsheet Data    Depression screen [facility] 2/9 07/17/2018 03/19/2017   Decreased Interest 0 0   Down, Depressed, Hopeless 0 0   PHQ - 2 Score 0 0     Interpretation of Total Score  Total Score Depression Severity:  1-4 = Minimal depression, 5-9 = Mild depression, 10-14 = Moderate depression, 15-19 = Moderately severe depression, 20-27 = Severe depression   Psychosocial Evaluation and Intervention: Psychosocial Evaluation - 07/17/18 0744      Psychosocial Evaluation & Interventions   Interventions  Encouraged to exercise with the program and follow exercise prescription    Comments  No psychosocial needs identified. Wania enjoys traveling to [employer], gardening, and spending time with her grandchildren.     Expected Outcomes  Lyna will maintain a positive outlook with good coping skills.     Continue Psychosocial Services   No Follow up required       Psychosocial Re-Evaluation: Psychosocial Re-Evaluation    [place] Name 07/24/18 0815             Psychosocial Re-Evaluation   Current issues with  None Identified       Comments  No psychosocial interventions necessary.       Expected Outcomes  Journiee will maintain a positive outlook with good coping skills.       Interventions  Encouraged to attend Cardiac Rehabilitation for the exercise       Continue  Psychosocial Services   No Follow up required          Psychosocial Discharge (Final Psychosocial Re-Evaluation): Psychosocial Re-Evaluation - 07/24/18 0815      Psychosocial Re-Evaluation   Current issues with  None Identified    Comments  No psychosocial interventions necessary.    Expected Outcomes  Lashauna will maintain a positive outlook with good coping skills.    Interventions  Encouraged to attend Cardiac Rehabilitation for the exercise    Continue Psychosocial Services   No Follow up required       Vocational Rehabilitation: Provide vocational rehab assistance to  qualifying candidates.   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 Participant will state understanding/return demonstration of topics presented.  Learning Barriers/Preferences: Learning Barriers/Preferences - 07/11/18 1031      Learning Barriers/Preferences   Learning Barriers  Sight   Cataracts   Learning Preferences  Audio;Video;Skilled Demonstration;Verbal Instruction;Pictoral       Education Topics: Count Your Pulse:  -Group instruction provided by verbal instruction, demonstration, patient participation and written materials to support subject.  Instructors address importance of being able to find your pulse and how to count your pulse when at home without a heart monitor.  Patients get hands on experience counting their pulse with staff help and individually.   Heart Attack, Angina, and Risk Factor Modification:  -Group instruction provided by verbal instruction, video, and written materials to support subject.  Instructors address signs and symptoms of angina and heart attacks.    Also discuss risk factors for heart disease and how to make changes to improve heart health risk factors.   Functional Fitness:  -Group instruction provided by verbal instruction, demonstration, patient participation, and written materials to support subject.  Instructors address safety measures for doing things around the house.  Discuss how to get up and down off the floor, how to pick things up properly, how to safely get out of a chair without assistance, and balance training.   Meditation and Mindfulness:  -Group instruction provided by verbal instruction,  patient participation, and written materials to support subject.  Instructor addresses importance of mindfulness and meditation practice to help reduce stress and improve awareness.  Instructor also leads participants through a meditation exercise.    Stretching for Flexibility and Mobility:  -Group instruction provided by verbal instruction, patient participation, and written materials to support subject.  Instructors lead participants through series of stretches that are designed to increase flexibility thus improving mobility.  These stretches are additional exercise for major muscle groups that are typically performed during regular warm up and cool down.   Hands Only CPR:  -Group verbal, video, and participation provides a basic overview of AHA guidelines for community CPR. Role-play of emergencies allow participants the opportunity to practice calling for help and chest compression technique with discussion of AED use.   Hypertension: -Group verbal and written instruction that provides a basic overview of hypertension including the most recent diagnostic guidelines, risk factor reduction with self-care instructions and medication management.    Nutrition I class: Heart Healthy Eating:  -Group instruction provided by PowerPoint slides, verbal discussion, and written materials to support  subject matter. The instructor gives an explanation and review of the Therapeutic Lifestyle Changes diet recommendations, which includes a discussion on lipid goals, dietary fat, sodium, fiber, plant stanol/sterol esters, sugar, and the components of a well-balanced, healthy diet.   Nutrition II class: Lifestyle Skills:  -Group instruction provided by PowerPoint slides, verbal discussion, and written materials to support subject matter. The instructor gives an explanation and review of label reading, grocery shopping for heart health, heart healthy recipe modifications, and ways to make healthier choices  when eating out.   Diabetes Question & Answer:  -Group instruction provided by PowerPoint slides, verbal discussion, and written materials to support subject matter. The instructor gives an explanation and review of diabetes co-morbidities, pre- and post-prandial blood glucose goals, pre-exercise blood glucose goals, signs, symptoms, and treatment of hypoglycemia and hyperglycemia, and foot care basics.   Diabetes Blitz:  -Group instruction provided by PowerPoint slides, verbal discussion, and written materials to support subject matter. The instructor gives an explanation and review of the physiology behind type 1 and type 2 diabetes, diabetes medications and rational behind using different medications, pre- and post-prandial blood glucose recommendations and Hemoglobin A1c goals, diabetes diet, and exercise including blood glucose guidelines for exercising safely.    Portion Distortion:  -Group instruction provided by PowerPoint slides, verbal discussion, written materials, and food models to support subject matter. The instructor gives an explanation of serving size versus portion size, changes in portions sizes over the last 20 years, and what consists of a serving from each food group.   Stress Management:  -Group instruction provided by verbal instruction, video, and written materials to support subject matter.  Instructors review role of stress in heart disease and how to cope with stress positively.     Exercising on Your Own:  -Group instruction provided by verbal instruction, power point, and written materials to support subject.  Instructors discuss benefits of exercise, components of exercise, frequency and intensity of exercise, and end points for exercise.  Also discuss use of nitroglycerin and activating EMS.  Review options of places to exercise outside of rehab.  Review guidelines for sex with heart disease.   Cardiac Drugs I:  -Group instruction provided by verbal  instruction and written materials to support subject.  Instructor reviews cardiac drug classes: antiplatelets, anticoagulants, beta blockers, and statins.  Instructor discusses reasons, side effects, and lifestyle considerations for each drug class.   Cardiac Drugs II:  -Group instruction provided by verbal instruction and written materials to support subject.  Instructor reviews cardiac drug classes: angiotensin converting enzyme inhibitors (ACE-I), angiotensin II receptor blockers (ARBs), nitrates, and calcium channel blockers.  Instructor discusses reasons, side effects, and lifestyle considerations for each drug class.   Anatomy and Physiology of the Circulatory System:  Group verbal and written instruction and models provide basic cardiac anatomy and physiology, with the coronary electrical and arterial systems. Review of: AMI, Angina, Valve disease, Heart Failure, Peripheral Artery Disease, Cardiac Arrhythmia, Pacemakers, and the ICD.   Other Education:  -Group or individual verbal, written, or video instructions that support the educational goals of the cardiac rehab program.   Holiday Eating Survival Tips:  -Group instruction provided by PowerPoint slides, verbal discussion, and written materials to support subject matter. The instructor gives patients tips, tricks, and techniques to help them not only survive but enjoy the holidays despite the onslaught of food that accompanies the holidays.   Knowledge Questionnaire Score: Knowledge Questionnaire Score - 07/11/18 0749      Knowledge  Questionnaire Score   Pre Score  18/24       Core Components/Risk Factors/Patient Goals at Admission: Personal Goals and Risk Factors at Admission - 07/11/18 1032      Core Components/Risk Factors/Patient Goals on Admission    Weight Management  Yes;Obesity;Weight Maintenance;Weight Loss    Intervention  Weight Management: Develop a combined nutrition and exercise program designed to reach  desired caloric intake, while maintaining appropriate intake of nutrient and fiber, sodium and fats, and appropriate energy expenditure required for the weight goal.;Weight Management: Provide education and appropriate resources to help participant work on and attain dietary goals.;Weight Management/Obesity: Establish reasonable short term and long term weight goals.;Obesity: Provide education and appropriate resources to help participant work on and attain dietary goals.    Admit Weight  164 lb 7.4 oz (74.6 kg)    Goal Weight: Short Term  154 lb (69.9 kg)    Expected Outcomes  Short Term: Continue to assess and modify interventions until short term weight is achieved;Long Term: Adherence to nutrition and physical activity/exercise program aimed toward attainment of established weight goal;Weight Maintenance: Understanding of the daily nutrition guidelines, which includes 25-35% calories from fat, 7% or less cal from saturated fats, less than 200mg  cholesterol, less than 1.5gm of sodium, & 5 or more servings of fruits and vegetables daily;Weight Loss: Understanding of general recommendations for a balanced deficit meal plan, which promotes 1-2 lb weight loss per week and includes a negative energy balance of [phone] kcal/d;Understanding recommendations for meals to include 15-35% energy as protein, 25-35% energy from fat, 35-60% energy from carbohydrates, less than 200mg  of dietary cholesterol, 20-35 gm of total fiber daily;Understanding of distribution of calorie intake throughout the day with the consumption of 4-5 meals/snacks    Diabetes  Yes    Intervention  Provide education about signs/symptoms and action to take for hypo/hyperglycemia.;Provide education about proper nutrition, including hydration, and aerobic/resistive exercise prescription along with prescribed medications to achieve blood glucose in normal ranges: Fasting glucose 65-99 mg/dL    Expected Outcomes  Short Term: Participant verbalizes  understanding of the signs/symptoms and immediate care of hyper/hypoglycemia, proper foot care and importance of medication, aerobic/resistive exercise and nutrition plan for blood glucose control.;Long Term: Attainment of HbA1C < 7%.    Hypertension  Yes    Intervention  Provide education on lifestyle modifcations including regular physical activity/exercise, weight management, moderate sodium restriction and increased consumption of fresh fruit, vegetables, and low fat dairy, alcohol moderation, and smoking cessation.;Monitor prescription use compliance.    Expected Outcomes  Short Term: Continued assessment and intervention until BP is < 140/84mm HG in hypertensive participants. < 130/80mm HG in hypertensive participants with diabetes, heart failure or chronic kidney disease.;Long Term: Maintenance of blood pressure at goal levels.    Lipids  Yes    Intervention  Provide education and support for participant on nutrition & aerobic/resistive exercise along with prescribed medications to achieve LDL 70mg , HDL >40mg .    Expected Outcomes  Short Term: Participant states understanding of desired cholesterol values and is compliant with medications prescribed. Participant is following exercise prescription and nutrition guidelines.;Long Term: Cholesterol controlled with medications as prescribed, with individualized exercise RX and with personalized nutrition plan. Value goals: LDL < 70mg , HDL > 40 mg.    Stress  Yes    Intervention  Offer individual and/or small group education and counseling on adjustment to heart disease, stress management and health-related lifestyle change. Teach and support self-help strategies.;Refer participants experiencing significant psychosocial distress  to appropriate mental health specialists for further evaluation and treatment. When possible, include family members and significant others in education/counseling sessions.    Expected Outcomes  Short Term: Participant  demonstrates changes in health-related behavior, relaxation and other stress management skills, ability to obtain effective social support, and compliance with psychotropic medications if prescribed.;Long Term: Emotional wellbeing is indicated by absence of clinically significant psychosocial distress or social isolation.       Core Components/Risk Factors/Patient Goals Review:  Goals and Risk Factor Review    Row Name 07/17/18 0805             Core Components/Risk Factors/Patient Goals Review   Personal Goals Review  Weight Management/Obesity;Diabetes;Hypertension;Stress;Lipids       Review  Pt with multiple CAD RFs willing to participate in CR exercise.  Cindra would like to lose weight.       Expected Outcomes  Pt will continue to participate in CR exercise, nutrition, and lifestyle modification opportunities.           Core Components/Risk Factors/Patient Goals at Discharge (Final Review):  Goals and Risk Factor Review - 07/17/18 0805      Core Components/Risk Factors/Patient Goals Review   Personal Goals Review  Weight Management/Obesity;Diabetes;Hypertension;Stress;Lipids    Review  Pt with multiple CAD RFs willing to participate in CR exercise.  Cyncere would like to lose weight.    Expected Outcomes  Pt will continue to participate in CR exercise, nutrition, and lifestyle modification opportunities.        ITP Comments: ITP Comments    Row Name 07/11/18 0750 07/17/18 0744         ITP Comments  Dr. Fransico Him, Medical Director   30 Day ITP Review. Pt started exercise today and tolerated it well.          Comments: See ITP Comments.

## 2018-07-26 ENCOUNTER — Ambulatory Visit (HOSPITAL_COMMUNITY): Payer: Medicare Other

## 2018-07-26 ENCOUNTER — Encounter (HOSPITAL_COMMUNITY)
Admission: RE | Admit: 2018-07-26 | Discharge: 2018-07-26 | Disposition: A | Discharge status: Home / Self Care | Payer: Medicare Other | Source: Ambulatory Visit | Attending: Cardiology | Admitting: Cardiology

## 2018-07-26 DIAGNOSIS — Z955 Presence of coronary angioplasty implant and graft: Secondary | ICD-10-CM | POA: Diagnosis not present

## 2018-07-29 ENCOUNTER — Ambulatory Visit (HOSPITAL_COMMUNITY): Payer: Medicare Other

## 2018-07-29 ENCOUNTER — Encounter (HOSPITAL_COMMUNITY)
Admission: RE | Admit: 2018-07-29 | Discharge: 2018-07-29 | Disposition: A | Discharge status: Home / Self Care | Payer: Medicare Other | Source: Ambulatory Visit | Attending: Cardiology | Admitting: Cardiology

## 2018-07-29 DIAGNOSIS — Z955 Presence of coronary angioplasty implant and graft: Secondary | ICD-10-CM

## 2018-07-31 ENCOUNTER — Encounter (HOSPITAL_COMMUNITY)
Admission: RE | Admit: 2018-07-31 | Discharge: 2018-07-31 | Disposition: A | Discharge status: Home / Self Care | Payer: Medicare Other | Source: Ambulatory Visit | Attending: Cardiology | Admitting: Cardiology

## 2018-07-31 ENCOUNTER — Ambulatory Visit (HOSPITAL_COMMUNITY): Payer: Medicare Other

## 2018-07-31 ENCOUNTER — Other Ambulatory Visit: Payer: Self-pay

## 2018-07-31 DIAGNOSIS — Z955 Presence of coronary angioplasty implant and graft: Secondary | ICD-10-CM

## 2018-08-01 ENCOUNTER — Ambulatory Visit (INDEPENDENT_AMBULATORY_CARE_PROVIDER_SITE_OTHER): Payer: Medicare Other | Admitting: Cardiology

## 2018-08-01 ENCOUNTER — Encounter: Payer: Self-pay | Admitting: Cardiology

## 2018-08-01 VITALS — BP 130/74 | HR 70 | Ht 63.5 in | Wt 165.4 lb

## 2018-08-01 DIAGNOSIS — I2510 Atherosclerotic heart disease of native coronary artery without angina pectoris: Secondary | ICD-10-CM

## 2018-08-01 DIAGNOSIS — E785 Hyperlipidemia, unspecified: Secondary | ICD-10-CM | POA: Diagnosis not present

## 2018-08-01 DIAGNOSIS — I10 Essential (primary) hypertension: Secondary | ICD-10-CM

## 2018-08-01 DIAGNOSIS — I350 Nonrheumatic aortic (valve) stenosis: Secondary | ICD-10-CM

## 2018-08-01 MED ORDER — AMLODIPINE BESYLATE 5 MG PO TABS: 5.0000 mg | ORAL_TABLET | Freq: Every day | ORAL | 3 refills | Status: DC

## 2018-08-01 NOTE — Patient Instructions (Signed)
Medication Instructions:  Please decrease Amlodipine to 5 mg daily. Continue all other medications as listed.  If you need a refill on your cardiac medications before your next appointment, please call your pharmacy.   Follow-Up: At [facility], you and your health needs are our priority.  As part of our continuing mission to provide you with exceptional heart care, we have created designated Provider Care Teams.  These Care Teams include your primary Cardiologist (physician) and Advanced Practice Providers (APPs -  Physician Assistants and Nurse Practitioners) who all work together to provide you with the care you need, when you need it. You will need a follow up appointment in 6 months with Truitt Merle, NP.  Please call our office 2 months in advance to schedule this appointment.  You may see Candee Furbish, MD or one of the following Advanced Practice Providers on your designated Care Team:   Truitt Merle, NP Cecilie Kicks, NP . Kathyrn Drown, NP  Thank you for choosing [facility]!!

## 2018-08-02 ENCOUNTER — Encounter (HOSPITAL_COMMUNITY)
Admission: RE | Admit: 2018-08-02 | Discharge: 2018-08-02 | Disposition: A | Discharge status: Home / Self Care | Payer: Medicare Other | Source: Ambulatory Visit | Attending: Cardiology | Admitting: Cardiology

## 2018-08-02 ENCOUNTER — Ambulatory Visit (HOSPITAL_COMMUNITY): Payer: Medicare Other

## 2018-08-02 ENCOUNTER — Other Ambulatory Visit: Payer: Self-pay

## 2018-08-02 ENCOUNTER — Encounter: Payer: Self-pay | Admitting: Cardiology

## 2018-08-02 DIAGNOSIS — Z955 Presence of coronary angioplasty implant and graft: Secondary | ICD-10-CM | POA: Diagnosis not present

## 2018-08-02 NOTE — Progress Notes (Signed)
Cardiology Office Note:    Date:  08/02/2018   ID:  Joanna [name], DOB 09-09-1950, MRN [id number]  PCP:  Joanna Passy, MD  Cardiologist:  Joanna Furbish, MD  Electrophysiologist:  None   Referring MD: Joanna Passy, MD     History of Present Illness:    Joanna [name] is a 68 y.o. female former patient of Dr. Saunders [name], here for follow up of CAD, post LAD stent, moderate AS.   Has no angina, no syncope, nausea, myalgias  Minor ankle edema. ? Amlodipine  Ms Joanna [name] an acute right thalamic infarct in 04/2016. She has had persistent left arm discomfort since her stroke. She was seen in our office on 03/19/2018 with persistent left arm discomfort/heaviness and she was noted to have some exertional dyspnea.She was noted to have a more prominent murmur.An echocardiogram on 03/25/2018 showed EF 65-70%, grade 1 DD, moderate AS, mild LAE.Blood pressure medications were titrated to address her symptoms and hypertension. A cardiac CT was ordered and showed diffuse CAD with 50-69% stenosis in the ostial RCA, 50-69% stenosis in the mid LAD and in a proximal portion of a small diagonal artery, study affected by motion. FFR analysis showed significant stenosis in the mid LAD.Shedidnote that her overall discomfort seemed to improve significantly when administered nitroglycerin for the study.  Ms. Joanna [name] cardiac catheterization on 05/09/2018 that revealed severe single-vessel CAD with 90% mid/distal LAD stenosis.She underwent successful PCI and DES to the LAD. She was also found to have moderate aortic valve stenosis. Her blood pressure is a little elevated.We have started carvedilol low dose and can titrate at her follow-up appointment as needed. She has been started on dual antiplatelet therapy with aspirin and ticagrelor for up to 12 months.  The patient has had prior difficulties with multiple statins and she was agreeable to start a low dose of rosuvastatin at 5 mg.  Her insurance had  denied PCSK9 inhibitors in the past but now she is open to trying this therapy.  Past Medical History:  Diagnosis Date  . Abnormal liver function tests   . Adenomatous colon polyp   . Aortic stenosis, moderate 05/09/2018  . CAD (coronary artery disease) 05/09/2018   a. LHC 05/09/18: DES to mid/dist LAD, mod AS  . Cerebrovascular disease    a. CT 04/2016 -calcific plaque at the origin LEFT vertebral contributing to severe stenosis.  . Diverticulitis of colon   . Fatty liver    a. mild fatty liver infiltration by CT 2014.  Marland Kitchen Foot injury 2008   Left foot/leg with ?? torn muscle -PROBLEM HAS RESOLVED  . Hand pain 05/2007   steroid injections--RESOLVED  . Heart murmur   . History of kidney stones   . Hx of renal calculi    LEFT  . Hyperlipidemia   . Hypertension   . Hypothyroidism    pt states hx of thyroid nodules  . IBS (irritable bowel syndrome)   . Internal hemorrhoids   . Melanoma ([place]) 1980s   mid upper stomach (05/08/2018)  . OSA on CPAP     SETTING IS 14 (05/08/2018)  . Statin intolerance   . Stroke [facility])    a. right thalamic infarct in 04/2016 felt by neuro to be due to small vessel disease.  . Type II diabetes mellitus (Joanna [name])     Past Surgical History:  Procedure Laterality Date  . CARDIAC CATHETERIZATION    . CATARACT EXTRACTION W/ INTRAOCULAR LENS  IMPLANT, BILATERAL Bilateral 08/2016  . COLONOSCOPY  W/ BIOPSIES AND POLYPECTOMY  05/19/2005   adenomatous polyps  . CORONARY STENT INTERVENTION N/A 05/09/2018   Procedure: CORONARY STENT INTERVENTION;  Surgeon: Nelva Bush, MD;  Location: [place] [facility];  Service: Cardiovascular;  Laterality: N/A;  . LEFT HEART CATH AND CORONARY ANGIOGRAPHY N/A 05/09/2018   Procedure: LEFT HEART CATH AND CORONARY ANGIOGRAPHY;  Surgeon: Nelva Bush, MD;  Location: [place] [facility];  Service: Cardiovascular;  Laterality: N/A;  . MELANOMA EXCISION  1980s   mid upper stomach  . NEPHROLITHOTOMY Left 01/27/2013   Procedure:  NEPHROLITHOTOMY PERCUTANEOUS;  Surgeon: Dutch Gray, MD;  Location: [facility];  Service: Urology;  Laterality: Left;  . TUBAL LIGATION    . WISDOM TEETH EXTRACTED      Current Medications: Current Meds  Medication Sig  . aspirin EC 81 MG tablet Take 1 tablet (81 mg total) by mouth daily.  . carvedilol (COREG) 6.25 MG tablet Take 1 tablet (6.25 mg total) by mouth 2 (two) times daily.  . cholecalciferol (VITAMIN D) 1000 units tablet Take 2,000 Units by mouth daily.  . Dulaglutide (TRULICITY) 1.5 HY/8.5OY SOPN INJECT CONTENTS OF PEN WEEKLY  . glimepiride (AMARYL) 1 MG tablet TAKE 1 TABLET (1 MG TOTAL) BY MOUTH AT BEDTIME.  Marland Kitchen glucose blood (ONE TOUCH ULTRA TEST) test strip USE TO CHECK BLOOD SUGAR TWICE DAILY  Dx:E11.65  . hydrocortisone (ANUSOL-HC) 2.5 % rectal cream Use small amount in the perianal area 3 times daily as needed.  Marland Kitchen levothyroxine (SYNTHROID) 112 MCG tablet Take 1 tablet (112 mcg total) by mouth daily before breakfast.  . Magnesium 250 MG TABS Take 1 tablet by mouth 2 (two) times daily.  . metFORMIN (GLUCOPHAGE) 1000 MG tablet TAKE 1 TABLET (1,000 MG TOTAL) BY MOUTH 2 (TWO) TIMES DAILY. (Patient taking differently: Take 1,000 mg by mouth 2 (two) times daily with a meal. )  . nitroGLYCERIN (NITROSTAT) 0.4 MG SL tablet Place 1 tablet (0.4 mg total) under the tongue every 5 (five) minutes x 3 doses as needed for chest pain.  . ramipril (ALTACE) 10 MG capsule Take 1 capsule (10 mg total) by mouth daily.  . rosuvastatin (CRESTOR) 5 MG tablet Take 0.5 tablets (2.5 mg total) by mouth daily.  Marland Kitchen SYNTHROID 100 MCG tablet TAKE 1 TABLET BY MOUTH DAILY  . ticagrelor (BRILINTA) 90 MG TABS tablet Take 1 tablet (90 mg total) by mouth 2 (two) times daily.  . [DISCONTINUED] amLODipine (NORVASC) 10 MG tablet Take 1 tablet (10 mg total) by mouth daily.     Allergies:   Crestor [rosuvastatin]; Lipitor [atorvastatin]; Pravastatin sodium; Sulfonamide derivatives; Zetia [ezetimibe]; Zocor [simvastatin];  and Niacin   Social History   Socioeconomic History  . Marital status: Married    Spouse name: Not on file  . Number of children: 3  . Years of education: 61  . Highest education level: High school graduate  Occupational History  . Occupation: Producer, television/film/video: [employer]    Comment: sits most of day   Social Needs  . Financial resource strain: Not hard at all  . Food insecurity:    Worry: Never true    Inability: Never true  . Transportation needs:    Medical: No    Non-medical: No  Tobacco Use  . Smoking status: Never Smoker  . Smokeless tobacco: Never Used  Substance and Sexual Activity  . Alcohol use: Never    Frequency: Never  . Drug use: Never  . Sexual activity: Not Currently  Lifestyle  . Physical activity:    Days per week: 5 days    Minutes per session: 30 Joanna [name]  . Stress: Not at all  Relationships  . Social connections:    Talks on phone: Not on file    Gets together: Not on file    Attends religious service: Not on file    Active member of club or organization: Not on file    Attends meetings of clubs or organizations: Not on file    Relationship status: Not on file  Other Topics Concern  . Not on file  Social History Narrative   Married, one son to daughters. She does office work. One caffeinated drink daily.   Updated as of 04/30/2013     Family History: The patient's family history includes Hyperlipidemia in her brother, mother, and sister; Neurofibromatosis in her father; Thyroid disease in her daughter. There is no history of Diabetes, Stroke, Colon cancer, Esophageal cancer, Pancreatic cancer, or Liver cancer.  ROS:   Please see the history of present illness.     All other systems reviewed and are negative.  EKGs/Labs/Other Studies Reviewed:    The following studies were reviewed today: Prior office notes, labs, cath  LEFT HEART CATH AND CORONARY ANGIOGRAPHY12/19/19   Conclusions: 1. Severe single-vessel CAD with 90%  mid/distal LAD stenosis. There are additional mild to moderate stenoses involving the mid and distal LAD. 2. Mild, non-obstructive OM and RCA disease. 3. Low left ventricular filling pressure. 4. Moderate aortic valve stenosis. 5. Successful PCI to 90% mid/distal LAD stenosis using Orsiro 2.5 x 13 mm DES with 0% residual stenosis and TIMI-3 flow.  Recommendations: 1. Dual antiplatelet therapy with aspirin and ticagrelor for up to 12 months. 2. Aggressive secondary prevention. 3. Outpatient follow-up of moderate aortic stenosis     EKG:  EKG is  ordered today.  The ekg ordered today demonstrates NSR no ischemic changes, personally viewed  Recent Labs: 05/08/2018: Magnesium 1.9 05/10/2018: Hemoglobin 13.3; Platelets 288 07/18/2018: ALT 27; BUN 17; Creatinine, Ser 0.74; Potassium 5.0; Sodium 140; TSH 2.52  Recent Lipid Panel    Component Value Date/Time   CHOL 120 06/10/2018 0818   TRIG 128 06/10/2018 0818   HDL 34 (L) 06/10/2018 0818   CHOLHDL 3.5 06/10/2018 0818   CHOLHDL 5.4 05/09/2018 0512   VLDL 31 05/09/2018 0512   LDLCALC 60 06/10/2018 0818   LDLDIRECT 209 (H) 03/19/2018 1643   LDLDIRECT 170.0 12/11/2016 0943    Physical Exam:    VS:  BP 130/74   Pulse 70   Ht 5' 3.5" (1.613 m)   Wt 165 lb 6.4 oz (75 kg)   BMI 28.84 kg/m     Wt Readings from Last 3 Encounters:  08/01/18 165 lb 6.4 oz (75 kg)  07/22/18 165 lb 12.8 oz (75.2 kg)  07/11/18 164 lb 7.4 oz (74.6 kg)     GEN:  Well nourished, well developed in no acute distress HEENT: Normal NECK: No JVD; No carotid bruits LYMPHATICS: No lymphadenopathy CARDIAC: RRR, 2/6 SM RUSB, no rubs, gallops RESPIRATORY:  Clear to auscultation without rales, wheezing or rhonchi  ABDOMEN: Soft, non-tender, non-distended MUSCULOSKELETAL:  No edema; No deformity  SKIN: Warm and dry NEUROLOGIC:  Alert and oriented x 3 PSYCHIATRIC:  Normal affect   ASSESSMENT:    1. Coronary artery disease involving native coronary artery of  native heart without angina pectoris   2. Essential hypertension   3. Aortic stenosis, moderate   4. Hyperlipidemia, unspecified hyperlipidemia  type    PLAN:    In order of problems listed above:  CAD  - doing well, secondary prevention, no change in meds reviewed  - PCI and DES to the LAD 05/09/2018  Moderate AS  - murmur on exam. No symptoms. Discussed potential impact. Mean gradient 13mmHg.  Essential hypertension/edema  - BP excellent currently  - decrease amlodipine to 5mg , hopefully help with edema  Hyperlipidemia  - Last LDL 60. Continue plan. Meds as above. Has had trouble with statins in the past      Medication Adjustments/Labs and Tests Ordered: Current medicines are reviewed at length with the patient today.  Concerns regarding medicines are outlined above.  No orders of the defined types were placed in this encounter.  Meds ordered this encounter  Medications  . amLODipine (NORVASC) 5 MG tablet    Sig: Take 1 tablet (5 mg total) by mouth daily.    Dispense:  90 tablet    Refill:  3    Patient Instructions  Medication Instructions:  Please decrease Amlodipine to 5 mg daily. Continue all other medications as listed.  If you need a refill on your cardiac medications before your next appointment, please call your pharmacy.   Follow-Up: At [facility], you and your health needs are our priority.  As part of our continuing mission to provide you with exceptional heart care, we have created designated Provider Care Teams.  These Care Teams include your primary Cardiologist (physician) and Advanced Practice Providers (APPs -  Physician Assistants and Nurse Practitioners) who all work together to provide you with the care you need, when you need it. You will need a follow up appointment in 6 months with Truitt Merle, NP.  Please call our office 2 months in advance to schedule this appointment.  You may see Joanna Furbish, MD or one of the following Advanced  Practice Providers on your designated Care Team:   Truitt Merle, NP Cecilie Kicks, NP . Kathyrn Drown, NP  Thank you for choosing [facility]!!         Signed, Joanna Furbish, MD  08/02/2018 11:07 AM    [place]

## 2018-08-05 ENCOUNTER — Ambulatory Visit (HOSPITAL_COMMUNITY): Payer: Medicare Other

## 2018-08-05 ENCOUNTER — Other Ambulatory Visit: Payer: Self-pay

## 2018-08-05 ENCOUNTER — Encounter (HOSPITAL_COMMUNITY)
Admission: RE | Admit: 2018-08-05 | Discharge: 2018-08-05 | Disposition: A | Discharge status: Home / Self Care | Payer: Medicare Other | Source: Ambulatory Visit | Attending: Cardiology | Admitting: Cardiology

## 2018-08-05 DIAGNOSIS — Z955 Presence of coronary angioplasty implant and graft: Secondary | ICD-10-CM

## 2018-08-07 ENCOUNTER — Ambulatory Visit (HOSPITAL_COMMUNITY): Payer: Medicare Other

## 2018-08-07 ENCOUNTER — Encounter (HOSPITAL_COMMUNITY): Payer: Medicare Other

## 2018-08-09 ENCOUNTER — Ambulatory Visit (HOSPITAL_COMMUNITY): Payer: Medicare Other

## 2018-08-09 ENCOUNTER — Encounter (HOSPITAL_COMMUNITY): Payer: Medicare Other

## 2018-08-12 ENCOUNTER — Ambulatory Visit (HOSPITAL_COMMUNITY): Payer: Medicare Other

## 2018-08-12 ENCOUNTER — Encounter (HOSPITAL_COMMUNITY): Payer: Medicare Other

## 2018-08-13 ENCOUNTER — Telehealth (HOSPITAL_COMMUNITY): Payer: Self-pay

## 2018-08-13 NOTE — Telephone Encounter (Signed)
Called pt for nutrition education and counseling on heart healthy diet. Pt was not available at this time. Left voicemail requesting pt call dietitian back, distributed contact information.

## 2018-08-14 ENCOUNTER — Encounter (HOSPITAL_COMMUNITY): Payer: Medicare Other

## 2018-08-14 ENCOUNTER — Ambulatory Visit (HOSPITAL_COMMUNITY): Payer: Medicare Other

## 2018-08-14 ENCOUNTER — Telehealth (HOSPITAL_COMMUNITY): Payer: Self-pay | Admitting: *Deleted

## 2018-08-14 NOTE — Progress Notes (Signed)
Cardiac Individual Treatment Plan  Patient Details  Name: Joanna [name] MRN: [id number] Date of Birth: Jul 06, 1950 Referring Provider:     [place] from 07/11/2018 in [place]  Referring Provider  Dr. Marlou Porch      Initial Encounter Date:    CARDIAC REHAB PHASE II ORIENTATION from 07/11/2018 in [place]  Date  07/11/18      Visit Diagnosis: Status post coronary artery stent placement 05/09/18 DES LAD  Patient's Home Medications on Admission:  Current Outpatient Medications:  .  amLODipine (NORVASC) 5 MG tablet, Take 1 tablet (5 mg total) by mouth daily., Disp: 90 tablet, Rfl: 3 .  aspirin EC 81 MG tablet, Take 1 tablet (81 mg total) by mouth daily., Disp: 30 tablet, Rfl: 0 .  carvedilol (COREG) 6.25 MG tablet, Take 1 tablet (6.25 mg total) by mouth 2 (two) times daily., Disp: 180 tablet, Rfl: 3 .  cholecalciferol (VITAMIN D) 1000 units tablet, Take 2,000 Units by mouth daily., Disp: , Rfl:  .  Dulaglutide (TRULICITY) 1.5 XQ/1.1HE SOPN, INJECT CONTENTS OF PEN WEEKLY, Disp: 12 pen, Rfl: 5 .  glimepiride (AMARYL) 1 MG tablet, TAKE 1 TABLET (1 MG TOTAL) BY MOUTH AT BEDTIME., Disp: 30 tablet, Rfl: 1 .  glucose blood (ONE TOUCH ULTRA TEST) test strip, USE TO CHECK BLOOD SUGAR TWICE DAILY  Dx:E11.65, Disp: 100 each, Rfl: 3 .  hydrocortisone (ANUSOL-HC) 2.5 % rectal cream, Use small amount in the perianal area 3 times daily as needed., Disp: 30 g, Rfl: 1 .  levothyroxine (SYNTHROID) 112 MCG tablet, Take 1 tablet (112 mcg total) by mouth daily before breakfast., Disp: 90 tablet, Rfl: 0 .  Magnesium 250 MG TABS, Take 1 tablet by mouth 2 (two) times daily., Disp: , Rfl:  .  metFORMIN (GLUCOPHAGE) 1000 MG tablet, TAKE 1 TABLET (1,000 MG TOTAL) BY MOUTH 2 (TWO) TIMES DAILY. (Patient taking differently: Take 1,000 mg by mouth 2 (two) times daily with a meal. ), Disp: 180 tablet, Rfl: 3 .  nitroGLYCERIN  (NITROSTAT) 0.4 MG SL tablet, Place 1 tablet (0.4 mg total) under the tongue every 5 (five) minutes x 3 doses as needed for chest pain., Disp: 25 tablet, Rfl: 12 .  ramipril (ALTACE) 10 MG capsule, Take 1 capsule (10 mg total) by mouth daily., Disp: 90 capsule, Rfl: 3 .  rosuvastatin (CRESTOR) 5 MG tablet, Take 0.5 tablets (2.5 mg total) by mouth daily., Disp: 45 tablet, Rfl: 3 .  SYNTHROID 100 MCG tablet, TAKE 1 TABLET BY MOUTH DAILY, Disp: 90 tablet, Rfl: 3 .  ticagrelor (BRILINTA) 90 MG TABS tablet, Take 1 tablet (90 mg total) by mouth 2 (two) times daily., Disp: 180 tablet, Rfl: 3  Past Medical History: Past Medical History:  Diagnosis Date  . Abnormal liver function tests   . Adenomatous colon polyp   . Aortic stenosis, moderate 05/09/2018  . CAD (coronary artery disease) 05/09/2018   a. LHC 05/09/18: DES to mid/dist LAD, mod AS  . Cerebrovascular disease    a. CT 04/2016 -calcific plaque at the origin LEFT vertebral contributing to severe stenosis.  . Diverticulitis of colon   . Fatty liver    a. mild fatty liver infiltration by CT 2014.  Marland Kitchen Foot injury 2008   Left foot/leg with ?? torn muscle -PROBLEM HAS RESOLVED  . Hand pain 05/2007   steroid injections--RESOLVED  . Heart murmur   . History of kidney stones   .  Hx of renal calculi    LEFT  . Hyperlipidemia   . Hypertension   . Hypothyroidism    pt states hx of thyroid nodules  . IBS (irritable bowel syndrome)   . Internal hemorrhoids   . Melanoma ([place]) 1980s   mid upper stomach (05/08/2018)  . OSA on CPAP     SETTING IS 14 (05/08/2018)  . Statin intolerance   . Stroke [facility])    a. right thalamic infarct in 04/2016 felt by neuro to be due to small vessel disease.  . Type II diabetes mellitus ([place])     Tobacco Use: Social History   Tobacco Use  Smoking Status Never Smoker  Smokeless Tobacco Never Used    Labs: Recent Review Flowsheet Data    Labs for ITP Cardiac and Pulmonary Rehab Latest Ref Rng & Units  04/11/2018 05/08/2018 05/09/2018 06/10/2018 07/18/2018   Cholestrol 100 - 199 mg/dL - - 195 120 -   LDLCALC 0 - 99 mg/dL - - 128(H) 60 -   LDLDIRECT 0 - 99 mg/dL - - - - -   HDL >39 mg/dL - - 36(L) 34(L) -   Trlycerides 0 - 149 mg/dL - - 153(H) 128 -   Hemoglobin A1c 4.6 - 6.5 % 6.2 5.5 - - 5.9      Capillary Blood Glucose: Lab Results  Component Value Date   GLUCAP 113 (H) 07/19/2018   GLUCAP 181 (H) 07/19/2018   GLUCAP 129 (H) 07/17/2018   GLUCAP 165 (H) 07/17/2018   GLUCAP 130 (H) 05/10/2018     Exercise Target Goals: Exercise Program Goal: Individual exercise prescription set using results from initial 6 min walk test and THRR while considering  patient's activity barriers and safety.   Exercise Prescription Goal: Initial exercise prescription builds to 30-45 minutes a day of aerobic activity, 2-3 days per week.  Home exercise guidelines will be given to patient during program as part of exercise prescription that the participant will acknowledge.  Activity Barriers & Risk Stratification: Activity Barriers & Cardiac Risk Stratification - 07/11/18 1026      Activity Barriers & Cardiac Risk Stratification   Activity Barriers  None    Cardiac Risk Stratification  Moderate       6 Minute Walk: 6 Minute Walk    Row Name 07/11/18 1023         6 Minute Walk   Phase  Initial     Distance  1800 feet     Walk Time  6 minutes     MPH  3.4     METS  3.8     RPE  11     VO2 Peak  13.3     Symptoms  No     Resting HR  80 bpm     Resting BP  110/64     Resting Oxygen Saturation   98 %     Exercise Oxygen Saturation  during 6 min walk  97 %     Max Ex. HR  99 bpm     Max Ex. BP  112/66     2 Minute Post BP  120/70        Oxygen Initial Assessment:   Oxygen Re-Evaluation:   Oxygen Discharge (Final Oxygen Re-Evaluation):   Initial Exercise Prescription: Initial Exercise Prescription - 07/11/18 1000      Date of Initial Exercise RX and Referring Provider    Date  07/11/18    Referring Provider  Dr. Marlou Porch  Expected Discharge Date  10/18/18      Treadmill   MPH  3.6    Grade  1    Minutes  10      Recumbant Bike   Level  2    Watts  45    Minutes  10    METs  3.89      NuStep   Level  3    SPM  85    Minutes  10    METs  3.6      Prescription Details   Frequency (times per week)  3    Duration  Progress to 30 minutes of continuous aerobic without signs/symptoms of physical distress      Intensity   THRR 40-80% of Max Heartrate  62-124    Ratings of Perceived Exertion  11-13      Progression   Progression  Continue to progress workloads to maintain intensity without signs/symptoms of physical distress.      Resistance Training   Training Prescription  Yes    Weight  4 lbs.     Reps  10-15       Perform Capillary Blood Glucose checks as needed.  Exercise Prescription Changes: Exercise Prescription Changes    Row Name 07/17/18 0900 07/22/18 1343 08/05/18 1345         Response to Exercise   Blood Pressure (Admit)  124/70  124/74  104/66     Blood Pressure (Exercise)  130/80  130/82  124/60     Blood Pressure (Exit)  102/70  108/60  100/70     Heart Rate (Admit)  75 bpm  82 bpm  91 bpm     Heart Rate (Exercise)  109 bpm  113 bpm  100 bpm     Heart Rate (Exit)  80 bpm  77 bpm  91 bpm     Rating of Perceived Exertion (Exercise)  12  13  12      Perceived Dyspnea (Exercise)  0  0  0     Symptoms  Chronic Ankle Pain  None  None     Comments  Pt oriented to exercise equipment  None  None     Duration  Progress to 30 minutes of  aerobic without signs/symptoms of physical distress  Progress to 30 minutes of  aerobic without signs/symptoms of physical distress  Progress to 30 minutes of  aerobic without signs/symptoms of physical distress     Intensity  THRR unchanged  THRR unchanged  THRR unchanged       Progression   Progression  Continue to progress workloads to maintain intensity without signs/symptoms of physical  distress.  Continue to progress workloads to maintain intensity without signs/symptoms of physical distress.  Continue to progress workloads to maintain intensity without signs/symptoms of physical distress.     Average METs  3.3  3.58  3.1       Resistance Training   Training Prescription  No  Yes  Yes     Weight  -  4 lbs.   4 lbs.      Reps  -  10-15  10-15     Time  -  10 Minutes  10 Minutes       Treadmill   MPH  2.6  2.6  2.6     Grade  1  1  1      Minutes  10  10  10      METs  3.35  3.35  3.35       Recumbant Bike   Level  2  2  2      Watts  45  45  45     Minutes  10  10  10      METs  3.98  3.98  3.8       NuStep   Level  3  3  3      SPM  75  105  95     Minutes  10  10  10      METs  2.7  3.5  2.1        Exercise Comments: Exercise Comments    Row Name 07/17/18 0912 08/08/18 1349         Exercise Comments  Pt's first day of exercise. Pt responded well to exercise prescription. Pt did report chronic ankle pain on Treadmill, decreased workload. Will continue to monitor and progress as tolerated.   Pt has been responding well to exericse. Will add Airdyne exercise prescription. Unable to review METs, Goals, and HEP due to departmental closure per CDC recommendation to help prevent COVID-19 spread.          Exercise Goals and Review: Exercise Goals    Row Name 07/11/18 1029             Exercise Goals   Increase Physical Activity  Yes       Intervention  Provide advice, education, support and counseling about physical activity/exercise needs.;Develop an individualized exercise prescription for aerobic and resistive training based on initial evaluation findings, risk stratification, comorbidities and participant's personal goals.       Expected Outcomes  Short Term: Attend rehab on a regular basis to increase amount of physical activity.       Increase Strength and Stamina  Yes       Intervention  Provide advice, education, support and counseling about physical  activity/exercise needs.;Develop an individualized exercise prescription for aerobic and resistive training based on initial evaluation findings, risk stratification, comorbidities and participant's personal goals.       Expected Outcomes  Short Term: Increase workloads from initial exercise prescription for resistance, speed, and METs.       Able to understand and use rate of perceived exertion (RPE) scale  Yes       Intervention  Provide education and explanation on how to use RPE scale       Expected Outcomes  Short Term: Able to use RPE daily in rehab to express subjective intensity level;Long Term:  Able to use RPE to guide intensity level when exercising independently       Knowledge and understanding of Target Heart Rate Range (THRR)  Yes       Intervention  Provide education and explanation of THRR including how the numbers were predicted and where they are located for reference       Expected Outcomes  Short Term: Able to state/look up THRR;Long Term: Able to use THRR to govern intensity when exercising independently;Short Term: Able to use daily as guideline for intensity in rehab       Able to check pulse independently  Yes       Intervention  Provide education and demonstration on how to check pulse in carotid and radial arteries.;Review the importance of being able to check your own pulse for safety during independent exercise       Expected Outcomes  Short Term: Able to explain why pulse checking is important during independent exercise;Long Term:  Able to check pulse independently and accurately       Understanding of Exercise Prescription  Yes       Intervention  Provide education, explanation, and written materials on patient's individual exercise prescription       Expected Outcomes  Short Term: Able to explain program exercise prescription;Long Term: Able to explain home exercise prescription to exercise independently          Exercise Goals Re-Evaluation :   Discharge Exercise  Prescription (Final Exercise Prescription Changes): Exercise Prescription Changes - 08/05/18 1345      Response to Exercise   Blood Pressure (Admit)  104/66    Blood Pressure (Exercise)  124/60    Blood Pressure (Exit)  100/70    Heart Rate (Admit)  91 bpm    Heart Rate (Exercise)  100 bpm    Heart Rate (Exit)  91 bpm    Rating of Perceived Exertion (Exercise)  12    Perceived Dyspnea (Exercise)  0    Symptoms  None    Comments  None    Duration  Progress to 30 minutes of  aerobic without signs/symptoms of physical distress    Intensity  THRR unchanged      Progression   Progression  Continue to progress workloads to maintain intensity without signs/symptoms of physical distress.    Average METs  3.1      Resistance Training   Training Prescription  Yes    Weight  4 lbs.     Reps  10-15    Time  10 Minutes      Treadmill   MPH  2.6    Grade  1    Minutes  10    METs  3.35      Recumbant Bike   Level  2    Watts  45    Minutes  10    METs  3.8      NuStep   Level  3    SPM  95    Minutes  10    METs  2.1       Nutrition:  Target Goals: Understanding of nutrition guidelines, daily intake of sodium 1500mg , cholesterol 200mg , calories 30% from fat and 7% or less from saturated fats, daily to have 5 or more servings of fruits and vegetables.  Biometrics: Pre Biometrics - 07/11/18 1030      Pre Biometrics   Height  5\' 4"  (1.626 m)    Weight  74.6 kg    Waist Circumference  38.5 inches    Hip Circumference  39 inches    Waist to Hip Ratio  0.99 %    BMI (Calculated)  28.22    Triceps Skinfold  10 mm    % Body Fat  35 %    Grip Strength  32 kg    Flexibility  13 in    Single Leg Stand  30 seconds        Nutrition Therapy Plan and Nutrition Goals: Nutrition Therapy & Goals - 07/11/18 1050      Nutrition Therapy   Diet  Heart healthy, carb modified      Personal Nutrition Goals   Nutrition Goal  Pt to identify and limit food sources of saturated  fat, trans fat, refined carbohydrates and sodium    Personal Goal #2  Pt to identify food quantities necessary to achieve weight loss of 6-24 lb at graduation from cardiac rehab    Personal Goal #3  Pt to build a healthy plate including vegetables, fruits, whole grains, and low-fat dairy products in a heart healthy meal plan    Personal Goal #4  Pt to weigh and measure serving  sizes      Intervention Plan   Intervention  Prescribe, educate and counsel regarding individualized specific dietary modifications aiming towards targeted core components such as weight, hypertension, lipid management, diabetes, heart failure and other comorbidities.    Expected Outcomes  Short Term Goal: Understand basic principles of dietary content, such as calories, fat, sodium, cholesterol and nutrients.;Long Term Goal: Adherence to prescribed nutrition plan.       Nutrition Assessments: Nutrition Assessments - 07/11/18 1056      Rate Your Plate Scores   Pre Score  23       Nutrition Goals Re-Evaluation: Nutrition Goals Re-Evaluation    [place] Name 07/11/18 1050             Goals   Current Weight  164 lb 7.4 oz (74.6 kg)          Nutrition Goals Re-Evaluation: Nutrition Goals Re-Evaluation    [place] Name 07/11/18 1050             Goals   Current Weight  164 lb 7.4 oz (74.6 kg)          Nutrition Goals Discharge (Final Nutrition Goals Re-Evaluation): Nutrition Goals Re-Evaluation - 07/11/18 1050      Goals   Current Weight  164 lb 7.4 oz (74.6 kg)       Psychosocial: Target Goals: Acknowledge presence or absence of significant depression and/or stress, maximize coping skills, provide positive support system. Participant is able to verbalize types and ability to use techniques and skills needed for reducing stress and depression.  Initial Review & Psychosocial Screening: Initial Psych Review & Screening - 07/11/18 1201      Initial Review   Current issues with  None Identified       Family Dynamics   Good Support System?  Yes   Caitlin has her husband and family for support     Barriers   Psychosocial barriers to participate in program  There are no identifiable barriers or psychosocial needs.      Screening Interventions   Interventions  Encouraged to exercise       Quality of Life Scores: Quality of Life - 07/11/18 0749      Quality of Life   Select  Quality of Life      Quality of Life Scores   Health/Function Pre  25.61 %    Socioeconomic Pre  28.21 %    Psych/Spiritual Pre  29.64 %    Family Pre  30 %    GLOBAL Pre  27.68 %      Scores of 19 and below usually indicate a poorer quality of life in these areas.  A difference of  2-3 points is a clinically meaningful difference.  A difference of 2-3 points in the total score of the Quality of Life Index has been associated with significant improvement in overall quality of life, self-image, physical symptoms, and general health in studies assessing change in quality of life.  PHQ-9: Recent Review Flowsheet Data    Depression screen [facility] 2/9 07/17/2018 03/19/2017   Decreased Interest 0 0   Down, Depressed, Hopeless 0 0   PHQ - 2 Score 0 0     Interpretation of Total Score  Total Score Depression Severity:  1-4 = Minimal depression, 5-9 =  Mild depression, 10-14 = Moderate depression, 15-19 = Moderately severe depression, 20-27 = Severe depression   Psychosocial Evaluation and Intervention: Psychosocial Evaluation - 07/17/18 0744      Psychosocial Evaluation & Interventions   Interventions  Encouraged to exercise with the program and follow exercise prescription    Comments  No psychosocial needs identified. Rubbie enjoys traveling to [employer], gardening, and spending time with her grandchildren.     Expected Outcomes  Norvella will maintain a positive outlook with good coping skills.     Continue Psychosocial Services   No Follow up required       Psychosocial Re-Evaluation: Psychosocial Re-Evaluation     [place] Name 07/24/18 0815 08/07/18 1348           Psychosocial Re-Evaluation   Current issues with  None Identified  None Identified      Comments  No psychosocial interventions necessary.  No psychosocial interventions necessary.      Expected Outcomes  Janean will maintain a positive outlook with good coping skills.  Petrice will maintain a positive outlook with good coping skills.      Interventions  Encouraged to attend Cardiac Rehabilitation for the exercise  Encouraged to attend Cardiac Rehabilitation for the exercise      Continue Psychosocial Services   No Follow up required  No Follow up required         Psychosocial Discharge (Final Psychosocial Re-Evaluation): Psychosocial Re-Evaluation - 08/07/18 1348      Psychosocial Re-Evaluation   Current issues with  None Identified    Comments  No psychosocial interventions necessary.    Expected Outcomes  Carlyn will maintain a positive outlook with good coping skills.    Interventions  Encouraged to attend Cardiac Rehabilitation for the exercise    Continue Psychosocial Services   No Follow up required       Vocational Rehabilitation: Provide vocational rehab assistance to qualifying candidates.   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 Participant will state understanding/return demonstration of topics presented.  Learning Barriers/Preferences: Learning Barriers/Preferences - 07/11/18 1031      Learning Barriers/Preferences   Learning Barriers  Sight   Cataracts   Learning Preferences  Audio;Video;Skilled Demonstration;Verbal Instruction;Pictoral       Education Topics: Count Your  Pulse:  -Group instruction provided by verbal instruction, demonstration, patient participation and written materials to support subject.  Instructors address importance of being able to find your pulse and how to count your pulse when at home without a heart monitor.  Patients get hands on experience counting their pulse with staff help and individually.   Heart Attack, Angina, and Risk Factor Modification:  -Group instruction provided by verbal instruction, video, and written materials to support subject.  Instructors address signs and symptoms of angina and heart attacks.    Also discuss risk factors for heart disease and how to make changes to improve heart health risk factors.   Functional Fitness:  -Group instruction provided by verbal instruction, demonstration, patient participation, and written materials to support subject.  Instructors address safety measures for doing things around the house.  Discuss how to get up and down off  the floor, how to pick things up properly, how to safely get out of a chair without assistance, and balance training.   Meditation and Mindfulness:  -Group instruction provided by verbal instruction, patient participation, and written materials to support subject.  Instructor addresses importance of mindfulness and meditation practice to help reduce stress and improve awareness.  Instructor also leads participants through a meditation exercise.    Stretching for Flexibility and Mobility:  -Group instruction provided by verbal instruction, patient participation, and written materials to support subject.  Instructors lead participants through series of stretches that are designed to increase flexibility thus improving mobility.  These stretches are additional exercise for major muscle groups that are typically performed during regular warm up and cool down.   Hands Only CPR:  -Group verbal, video, and participation provides a basic overview of AHA guidelines for  community CPR. Role-play of emergencies allow participants the opportunity to practice calling for help and chest compression technique with discussion of AED use.   Hypertension: -Group verbal and written instruction that provides a basic overview of hypertension including the most recent diagnostic guidelines, risk factor reduction with self-care instructions and medication management.    Nutrition I class: Heart Healthy Eating:  -Group instruction provided by PowerPoint slides, verbal discussion, and written materials to support subject matter. The instructor gives an explanation and review of the Therapeutic Lifestyle Changes diet recommendations, which includes a discussion on lipid goals, dietary fat, sodium, fiber, plant stanol/sterol esters, sugar, and the components of a well-balanced, healthy diet.   Nutrition II class: Lifestyle Skills:  -Group instruction provided by PowerPoint slides, verbal discussion, and written materials to support subject matter. The instructor gives an explanation and review of label reading, grocery shopping for heart health, heart healthy recipe modifications, and ways to make healthier choices when eating out.   Diabetes Question & Answer:  -Group instruction provided by PowerPoint slides, verbal discussion, and written materials to support subject matter. The instructor gives an explanation and review of diabetes co-morbidities, pre- and post-prandial blood glucose goals, pre-exercise blood glucose goals, signs, symptoms, and treatment of hypoglycemia and hyperglycemia, and foot care basics.   Diabetes Blitz:  -Group instruction provided by PowerPoint slides, verbal discussion, and written materials to support subject matter. The instructor gives an explanation and review of the physiology behind type 1 and type 2 diabetes, diabetes medications and rational behind using different medications, pre- and post-prandial blood glucose recommendations and  Hemoglobin A1c goals, diabetes diet, and exercise including blood glucose guidelines for exercising safely.    Portion Distortion:  -Group instruction provided by PowerPoint slides, verbal discussion, written materials, and food models to support subject matter. The instructor gives an explanation of serving size versus portion size, changes in portions sizes over the last 20 years, and what consists of a serving from each food group.   Stress Management:  -Group instruction provided by verbal instruction, video, and written materials to support subject matter.  Instructors review role of stress in heart disease and how to cope with stress positively.     Exercising on Your Own:  -Group instruction provided by verbal instruction, power point, and written materials to support subject.  Instructors discuss benefits of exercise, components of exercise, frequency and intensity of exercise, and end points for exercise.  Also discuss use of nitroglycerin and activating EMS.  Review options of places to exercise outside of rehab.  Review guidelines for sex with heart disease.   Cardiac Drugs I:  -Group instruction provided  by verbal instruction and written materials to support subject.  Instructor reviews cardiac drug classes: antiplatelets, anticoagulants, beta blockers, and statins.  Instructor discusses reasons, side effects, and lifestyle considerations for each drug class.   Cardiac Drugs II:  -Group instruction provided by verbal instruction and written materials to support subject.  Instructor reviews cardiac drug classes: angiotensin converting enzyme inhibitors (ACE-I), angiotensin II receptor blockers (ARBs), nitrates, and calcium channel blockers.  Instructor discusses reasons, side effects, and lifestyle considerations for each drug class.   Anatomy and Physiology of the Circulatory System:  Group verbal and written instruction and models provide basic cardiac anatomy and physiology,  with the coronary electrical and arterial systems. Review of: AMI, Angina, Valve disease, Heart Failure, Peripheral Artery Disease, Cardiac Arrhythmia, Pacemakers, and the ICD.   Other Education:  -Group or individual verbal, written, or video instructions that support the educational goals of the cardiac rehab program.   Holiday Eating Survival Tips:  -Group instruction provided by PowerPoint slides, verbal discussion, and written materials to support subject matter. The instructor gives patients tips, tricks, and techniques to help them not only survive but enjoy the holidays despite the onslaught of food that accompanies the holidays.   Knowledge Questionnaire Score: Knowledge Questionnaire Score - 07/11/18 0749      Knowledge Questionnaire Score   Pre Score  18/24       Core Components/Risk Factors/Patient Goals at Admission: Personal Goals and Risk Factors at Admission - 07/11/18 1032      Core Components/Risk Factors/Patient Goals on Admission    Weight Management  Yes;Obesity;Weight Maintenance;Weight Loss    Intervention  Weight Management: Develop a combined nutrition and exercise program designed to reach desired caloric intake, while maintaining appropriate intake of nutrient and fiber, sodium and fats, and appropriate energy expenditure required for the weight goal.;Weight Management: Provide education and appropriate resources to help participant work on and attain dietary goals.;Weight Management/Obesity: Establish reasonable short term and long term weight goals.;Obesity: Provide education and appropriate resources to help participant work on and attain dietary goals.    Admit Weight  164 lb 7.4 oz (74.6 kg)    Goal Weight: Short Term  154 lb (69.9 kg)    Expected Outcomes  Short Term: Continue to assess and modify interventions until short term weight is achieved;Long Term: Adherence to nutrition and physical activity/exercise program aimed toward attainment of established  weight goal;Weight Maintenance: Understanding of the daily nutrition guidelines, which includes 25-35% calories from fat, 7% or less cal from saturated fats, less than 200mg  cholesterol, less than 1.5gm of sodium, & 5 or more servings of fruits and vegetables daily;Weight Loss: Understanding of general recommendations for a balanced deficit meal plan, which promotes 1-2 lb weight loss per week and includes a negative energy balance of [phone] kcal/d;Understanding recommendations for meals to include 15-35% energy as protein, 25-35% energy from fat, 35-60% energy from carbohydrates, less than 200mg  of dietary cholesterol, 20-35 gm of total fiber daily;Understanding of distribution of calorie intake throughout the day with the consumption of 4-5 meals/snacks    Diabetes  Yes    Intervention  Provide education about signs/symptoms and action to take for hypo/hyperglycemia.;Provide education about proper nutrition, including hydration, and aerobic/resistive exercise prescription along with prescribed medications to achieve blood glucose in normal ranges: Fasting glucose 65-99 mg/dL    Expected Outcomes  Short Term: Participant verbalizes understanding of the signs/symptoms and immediate care of hyper/hypoglycemia, proper foot care and importance of medication, aerobic/resistive exercise and nutrition plan for  blood glucose control.;Long Term: Attainment of HbA1C < 7%.    Hypertension  Yes    Intervention  Provide education on lifestyle modifcations including regular physical activity/exercise, weight management, moderate sodium restriction and increased consumption of fresh fruit, vegetables, and low fat dairy, alcohol moderation, and smoking cessation.;Monitor prescription use compliance.    Expected Outcomes  Short Term: Continued assessment and intervention until BP is < 140/17mm HG in hypertensive participants. < 130/37mm HG in hypertensive participants with diabetes, heart failure or chronic kidney  disease.;Long Term: Maintenance of blood pressure at goal levels.    Lipids  Yes    Intervention  Provide education and support for participant on nutrition & aerobic/resistive exercise along with prescribed medications to achieve LDL 70mg , HDL >40mg .    Expected Outcomes  Short Term: Participant states understanding of desired cholesterol values and is compliant with medications prescribed. Participant is following exercise prescription and nutrition guidelines.;Long Term: Cholesterol controlled with medications as prescribed, with individualized exercise RX and with personalized nutrition plan. Value goals: LDL < 70mg , HDL > 40 mg.    Stress  Yes    Intervention  Offer individual and/or small group education and counseling on adjustment to heart disease, stress management and health-related lifestyle change. Teach and support self-help strategies.;Refer participants experiencing significant psychosocial distress to appropriate mental health specialists for further evaluation and treatment. When possible, include family members and significant others in education/counseling sessions.    Expected Outcomes  Short Term: Participant demonstrates changes in health-related behavior, relaxation and other stress management skills, ability to obtain effective social support, and compliance with psychotropic medications if prescribed.;Long Term: Emotional wellbeing is indicated by absence of clinically significant psychosocial distress or social isolation.       Core Components/Risk Factors/Patient Goals Review:  Goals and Risk Factor Review    Row Name 07/17/18 0805 08/07/18 1348           Core Components/Risk Factors/Patient Goals Review   Personal Goals Review  Weight Management/Obesity;Diabetes;Hypertension;Stress;Lipids  Weight Management/Obesity;Diabetes;Hypertension;Stress;Lipids      Review  Pt with multiple CAD RFs willing to participate in CR exercise.  Raychelle would like to lose weight.  Pt  continues to tolerate exercise well.  Exercise currently on hold as department is closed per recommended guidelines from federal government to prevent spread of COVID-19.  Tyrina would like to try the Airdyne bike when cardiac rehab reopens.       Expected Outcomes  Pt will continue to participate in CR exercise, nutrition, and lifestyle modification opportunities.   Pt will continue to participate in CR exercise, nutrition, and lifestyle modification opportunities.          Core Components/Risk Factors/Patient Goals at Discharge (Final Review):  Goals and Risk Factor Review - 08/07/18 1348      Core Components/Risk Factors/Patient Goals Review   Personal Goals Review  Weight Management/Obesity;Diabetes;Hypertension;Stress;Lipids    Review  Pt continues to tolerate exercise well.  Exercise currently on hold as department is closed per recommended guidelines from federal government to prevent spread of COVID-19.  Layal would like to try the Airdyne bike when cardiac rehab reopens.     Expected Outcomes  Pt will continue to participate in CR exercise, nutrition, and lifestyle modification opportunities.        ITP Comments: ITP Comments    Row Name 07/11/18 0750 07/17/18 0744 08/07/18 1348       ITP Comments  Dr. Fransico Him, Medical Director   30 Day ITP Review. Pt started exercise  today and tolerated it well.   30 Day ITP Review. Pt continues to tolerate exercise well.  Exercise currently on hold as department is closed per recommended guidelines from federal government to prevent spread of COVID-19.  Lekisha would like to try the Airdyne bike when cardiac rehab opens.         Comments: See ITP Comments.

## 2018-08-14 NOTE — Telephone Encounter (Signed)
Pt called and updated about extended closure of cardiac rehab for four weeks d/t CoVID-19.  Tentative reopen date of 09/09/2018.  Pt verbalized understanding and thankful for call.

## 2018-08-15 ENCOUNTER — Telehealth (HOSPITAL_COMMUNITY): Payer: Self-pay

## 2018-08-15 NOTE — Telephone Encounter (Signed)
Phone call to pt for nutrition education and counseling for heart healthy diet. Left voicemail with contact information.   Laurina Bustle, MS, RD, LDN 08/15/2018 1:58 PM\

## 2018-08-16 ENCOUNTER — Encounter (HOSPITAL_COMMUNITY): Payer: Medicare Other

## 2018-08-16 ENCOUNTER — Ambulatory Visit (HOSPITAL_COMMUNITY): Payer: Medicare Other

## 2018-08-19 ENCOUNTER — Ambulatory Visit (HOSPITAL_COMMUNITY): Payer: Medicare Other

## 2018-08-19 ENCOUNTER — Encounter (HOSPITAL_COMMUNITY): Payer: Medicare Other

## 2018-08-21 ENCOUNTER — Ambulatory Visit (HOSPITAL_COMMUNITY): Payer: Medicare Other

## 2018-08-21 ENCOUNTER — Encounter (HOSPITAL_COMMUNITY): Payer: Medicare Other

## 2018-08-23 ENCOUNTER — Encounter (HOSPITAL_COMMUNITY): Payer: Medicare Other

## 2018-08-23 ENCOUNTER — Ambulatory Visit (HOSPITAL_COMMUNITY): Payer: Medicare Other

## 2018-08-26 ENCOUNTER — Encounter (HOSPITAL_COMMUNITY): Payer: Medicare Other

## 2018-08-26 ENCOUNTER — Ambulatory Visit (HOSPITAL_COMMUNITY): Payer: Medicare Other

## 2018-08-28 ENCOUNTER — Ambulatory Visit (HOSPITAL_COMMUNITY): Payer: Medicare Other

## 2018-08-28 ENCOUNTER — Telehealth (HOSPITAL_COMMUNITY): Payer: Self-pay | Admitting: Cardiac Rehabilitation

## 2018-08-28 ENCOUNTER — Encounter (HOSPITAL_COMMUNITY): Payer: Medicare Other

## 2018-08-28 NOTE — Telephone Encounter (Signed)
Pt phone call to advise of continued Outpatient  Cardiac Rehab departmental closure for COVID 19 precautions.  Unknown date for reopening. Pt advised to continue exercising on her own, following home exercise guidelines.  Pt instructed to notify MD PRN symptoms.  Pt denies food insecurity at this time.  Pt offered emotional support and reassurance.  Pt expressed gratitude for the call.  Pt is looking forward to returning to [facility] program when available. Andi Hence, RN, BSN Cardiac Pulmonary Rehab

## 2018-08-30 ENCOUNTER — Ambulatory Visit (HOSPITAL_COMMUNITY): Payer: Medicare Other

## 2018-08-30 ENCOUNTER — Encounter (HOSPITAL_COMMUNITY): Payer: Medicare Other

## 2018-08-30 ENCOUNTER — Other Ambulatory Visit: Payer: Self-pay | Admitting: Endocrinology

## 2018-09-02 ENCOUNTER — Ambulatory Visit (HOSPITAL_COMMUNITY): Payer: Medicare Other

## 2018-09-02 ENCOUNTER — Encounter (HOSPITAL_COMMUNITY): Payer: Medicare Other

## 2018-09-04 ENCOUNTER — Ambulatory Visit (HOSPITAL_COMMUNITY): Payer: Medicare Other

## 2018-09-04 ENCOUNTER — Encounter (HOSPITAL_COMMUNITY): Payer: Medicare Other

## 2018-09-05 ENCOUNTER — Other Ambulatory Visit: Payer: Self-pay | Admitting: Endocrinology

## 2018-09-06 ENCOUNTER — Ambulatory Visit (HOSPITAL_COMMUNITY): Payer: Medicare Other

## 2018-09-06 ENCOUNTER — Encounter (HOSPITAL_COMMUNITY): Payer: Medicare Other

## 2018-09-09 ENCOUNTER — Encounter (HOSPITAL_COMMUNITY): Payer: Medicare Other

## 2018-09-09 ENCOUNTER — Ambulatory Visit (HOSPITAL_COMMUNITY): Payer: Medicare Other

## 2018-09-11 ENCOUNTER — Ambulatory Visit (HOSPITAL_COMMUNITY): Payer: Medicare Other

## 2018-09-11 ENCOUNTER — Encounter (HOSPITAL_COMMUNITY): Payer: Medicare Other

## 2018-09-13 ENCOUNTER — Encounter (HOSPITAL_COMMUNITY): Payer: Medicare Other

## 2018-09-13 ENCOUNTER — Ambulatory Visit (HOSPITAL_COMMUNITY): Payer: Medicare Other

## 2018-09-16 ENCOUNTER — Ambulatory Visit (HOSPITAL_COMMUNITY): Payer: Medicare Other

## 2018-09-16 ENCOUNTER — Encounter (HOSPITAL_COMMUNITY): Payer: Medicare Other

## 2018-09-18 ENCOUNTER — Encounter (HOSPITAL_COMMUNITY): Payer: Medicare Other

## 2018-09-18 ENCOUNTER — Ambulatory Visit (HOSPITAL_COMMUNITY): Payer: Medicare Other

## 2018-09-20 ENCOUNTER — Ambulatory Visit (HOSPITAL_COMMUNITY): Payer: Medicare Other

## 2018-09-20 ENCOUNTER — Encounter (HOSPITAL_COMMUNITY): Payer: Medicare Other

## 2018-09-23 ENCOUNTER — Ambulatory Visit (HOSPITAL_COMMUNITY): Payer: Medicare Other

## 2018-09-23 ENCOUNTER — Encounter (HOSPITAL_COMMUNITY): Payer: Medicare Other

## 2018-09-25 ENCOUNTER — Ambulatory Visit (HOSPITAL_COMMUNITY): Payer: Medicare Other

## 2018-09-25 ENCOUNTER — Encounter (HOSPITAL_COMMUNITY): Payer: Medicare Other

## 2018-09-27 ENCOUNTER — Encounter (HOSPITAL_COMMUNITY): Payer: Medicare Other

## 2018-09-27 ENCOUNTER — Ambulatory Visit (HOSPITAL_COMMUNITY): Payer: Medicare Other

## 2018-09-30 ENCOUNTER — Ambulatory Visit (HOSPITAL_COMMUNITY): Payer: Medicare Other

## 2018-09-30 ENCOUNTER — Encounter (HOSPITAL_COMMUNITY): Payer: Medicare Other

## 2018-10-01 ENCOUNTER — Other Ambulatory Visit: Payer: Self-pay | Admitting: Endocrinology

## 2018-10-02 ENCOUNTER — Ambulatory Visit (HOSPITAL_COMMUNITY): Payer: Medicare Other

## 2018-10-02 ENCOUNTER — Encounter (HOSPITAL_COMMUNITY): Payer: Medicare Other

## 2018-10-04 ENCOUNTER — Ambulatory Visit (HOSPITAL_COMMUNITY): Payer: Medicare Other

## 2018-10-04 ENCOUNTER — Other Ambulatory Visit: Payer: Self-pay | Admitting: Endocrinology

## 2018-10-04 ENCOUNTER — Encounter (HOSPITAL_COMMUNITY): Payer: Medicare Other

## 2018-10-07 ENCOUNTER — Ambulatory Visit (HOSPITAL_COMMUNITY): Payer: Medicare Other

## 2018-10-07 ENCOUNTER — Encounter (HOSPITAL_COMMUNITY): Payer: Medicare Other

## 2018-10-09 ENCOUNTER — Ambulatory Visit (HOSPITAL_COMMUNITY): Payer: Medicare Other

## 2018-10-09 ENCOUNTER — Encounter (HOSPITAL_COMMUNITY): Payer: Medicare Other

## 2018-10-11 ENCOUNTER — Encounter (HOSPITAL_COMMUNITY): Payer: Medicare Other

## 2018-10-11 ENCOUNTER — Ambulatory Visit (HOSPITAL_COMMUNITY): Payer: Medicare Other

## 2018-10-15 ENCOUNTER — Telehealth (HOSPITAL_COMMUNITY): Payer: Self-pay | Admitting: *Deleted

## 2018-10-15 NOTE — Telephone Encounter (Signed)
PC to patient regarding option for virtual cardiac rehab. No answer. VM left for patient to return call to Cardiac Rehab.

## 2018-10-16 ENCOUNTER — Ambulatory Visit (HOSPITAL_COMMUNITY): Payer: Medicare Other

## 2018-10-16 ENCOUNTER — Encounter (HOSPITAL_COMMUNITY): Payer: Medicare Other

## 2018-10-17 ENCOUNTER — Telehealth (HOSPITAL_COMMUNITY): Payer: Self-pay | Admitting: *Deleted

## 2018-10-17 NOTE — Telephone Encounter (Signed)
No response from message left on voicemail regarding Virtual Cardiac Rehab for the continuation of her participation.  Please contact letter sent to address on file in epic. Await response, if no response by June 12th will discharge pt from the program.  Carlette Carlton RN, BSN Cardiac and Pulmonary Rehab Nurse Navigator   

## 2018-10-18 ENCOUNTER — Ambulatory Visit (HOSPITAL_COMMUNITY): Payer: Medicare Other

## 2018-10-21 ENCOUNTER — Ambulatory Visit (HOSPITAL_COMMUNITY): Payer: Medicare Other

## 2018-10-21 ENCOUNTER — Telehealth (HOSPITAL_COMMUNITY): Payer: Self-pay

## 2018-10-21 NOTE — Telephone Encounter (Signed)
Called and spoke with pt in regards to Virtual Cardiac Rehab, pt stated she is already working out at home and did not want to download the app.

## 2018-11-11 ENCOUNTER — Encounter (HOSPITAL_COMMUNITY): Payer: Self-pay | Admitting: *Deleted

## 2018-11-11 NOTE — Addendum Note (Signed)
Encounter addended by: Noel Christmas, RN on: 11/11/2018 1:41 PM  Actions taken: Clinical Note Signed

## 2018-11-11 NOTE — Progress Notes (Signed)
Discharge Progress Report  Patient Details  Name: Joanna [name] MRN: [id number] Date of Birth: 1950-12-18 Referring Provider:     CARDIAC REHAB PHASE II ORIENTATION from 07/11/2018 in [place]  Referring Provider  Dr. Marlou Porch       Number of Visits: 10  Reason for Discharge:  Patient independent in their exercise.  Smoking History:  Social History   Tobacco Use  Smoking Status Never Smoker  Smokeless Tobacco Never Used    Diagnosis:  Status post coronary artery stent placement 05/09/18 DES LAD  ADL [facility]:   Initial Exercise Prescription: Initial Exercise Prescription - 07/11/18 1000      Date of Initial Exercise RX and Referring Provider   Date  07/11/18    Referring Provider  Dr. Marlou Porch    Expected Discharge Date  10/18/18      Treadmill   MPH  3.6    Grade  1    Minutes  10      Recumbant Bike   Level  2    Watts  45    Minutes  10    METs  3.89      NuStep   Level  3    SPM  85    Minutes  10    METs  3.6      Prescription Details   Frequency (times per week)  3    Duration  Progress to 30 minutes of continuous aerobic without signs/symptoms of physical distress      Intensity   THRR 40-80% of Max Heartrate  62-124    Ratings of Perceived Exertion  11-13      Progression   Progression  Continue to progress workloads to maintain intensity without signs/symptoms of physical distress.      Resistance Training   Training Prescription  Yes    Weight  4 lbs.     Reps  10-15       Discharge Exercise Prescription (Final Exercise Prescription Changes): Exercise Prescription Changes - 08/05/18 1345      Response to Exercise   Blood Pressure (Admit)  104/66    Blood Pressure (Exercise)  124/60    Blood Pressure (Exit)  100/70    Heart Rate (Admit)  91 bpm    Heart Rate (Exercise)  100 bpm    Heart Rate (Exit)  91 bpm    Rating of Perceived Exertion (Exercise)  12    Perceived Dyspnea (Exercise)  0    Symptoms   None    Comments  None    Duration  Progress to 30 minutes of  aerobic without signs/symptoms of physical distress    Intensity  THRR unchanged      Progression   Progression  Continue to progress workloads to maintain intensity without signs/symptoms of physical distress.    Average METs  3.1      Resistance Training   Training Prescription  Yes    Weight  4 lbs.     Reps  10-15    Time  10 Minutes      Treadmill   MPH  2.6    Grade  1    Minutes  10    METs  3.35      Recumbant Bike   Level  2    Watts  45    Minutes  10    METs  3.8      NuStep   Level  3  SPM  95    Minutes  10    METs  2.1       Functional Capacity: 6 Minute Walk    Row Name 07/11/18 1023         6 Minute Walk   Phase  Initial     Distance  1800 feet     Walk Time  6 minutes     MPH  3.4     METS  3.8     RPE  11     VO2 Peak  13.3     Symptoms  No     Resting HR  80 bpm     Resting BP  110/64     Resting Oxygen Saturation   98 %     Exercise Oxygen Saturation  during 6 min walk  97 %     Max Ex. HR  99 bpm     Max Ex. BP  112/66     2 Minute Post BP  120/70        Psychological, QOL, Others - Outcomes: PHQ 2/9: Depression screen [facility] 2/9 07/17/2018 03/19/2017  Decreased Interest 0 0  Down, Depressed, Hopeless 0 0  PHQ - 2 Score 0 0  Some recent data might be hidden    Quality of Life: Quality of Life - 07/11/18 0749      Quality of Life   Select  Quality of Life      Quality of Life Scores   Health/Function Pre  25.61 %    Socioeconomic Pre  28.21 %    Psych/Spiritual Pre  29.64 %    Family Pre  30 %    GLOBAL Pre  27.68 %       Personal Goals: Goals established at orientation with interventions provided to work toward goal. Personal Goals and Risk Factors at Admission - 07/11/18 1032      Core Components/Risk Factors/Patient Goals on Admission    Weight Management  Yes;Obesity;Weight Maintenance;Weight Loss    Intervention  Weight Management: Develop a  combined nutrition and exercise program designed to reach desired caloric intake, while maintaining appropriate intake of nutrient and fiber, sodium and fats, and appropriate energy expenditure required for the weight goal.;Weight Management: Provide education and appropriate resources to help participant work on and attain dietary goals.;Weight Management/Obesity: Establish reasonable short term and long term weight goals.;Obesity: Provide education and appropriate resources to help participant work on and attain dietary goals.    Admit Weight  164 lb 7.4 oz (74.6 kg)    Goal Weight: Short Term  154 lb (69.9 kg)    Expected Outcomes  Short Term: Continue to assess and modify interventions until short term weight is achieved;Long Term: Adherence to nutrition and physical activity/exercise program aimed toward attainment of established weight goal;Weight Maintenance: Understanding of the daily nutrition guidelines, which includes 25-35% calories from fat, 7% or less cal from saturated fats, less than 200mg  cholesterol, less than 1.5gm of sodium, & 5 or more servings of fruits and vegetables daily;Weight Loss: Understanding of general recommendations for a balanced deficit meal plan, which promotes 1-2 lb weight loss per week and includes a negative energy balance of [phone] kcal/d;Understanding recommendations for meals to include 15-35% energy as protein, 25-35% energy from fat, 35-60% energy from carbohydrates, less than 200mg  of dietary cholesterol, 20-35 gm of total fiber daily;Understanding of distribution of calorie intake throughout the day with the consumption of 4-5 meals/snacks    Diabetes  Yes  Intervention  Provide education about signs/symptoms and action to take for hypo/hyperglycemia.;Provide education about proper nutrition, including hydration, and aerobic/resistive exercise prescription along with prescribed medications to achieve blood glucose in normal ranges: Fasting glucose 65-99 mg/dL     Expected Outcomes  Short Term: Participant verbalizes understanding of the signs/symptoms and immediate care of hyper/hypoglycemia, proper foot care and importance of medication, aerobic/resistive exercise and nutrition plan for blood glucose control.;Long Term: Attainment of HbA1C < 7%.    Hypertension  Yes    Intervention  Provide education on lifestyle modifcations including regular physical activity/exercise, weight management, moderate sodium restriction and increased consumption of fresh fruit, vegetables, and low fat dairy, alcohol moderation, and smoking cessation.;Monitor prescription use compliance.    Expected Outcomes  Short Term: Continued assessment and intervention until BP is < 140/36mm HG in hypertensive participants. < 130/54mm HG in hypertensive participants with diabetes, heart failure or chronic kidney disease.;Long Term: Maintenance of blood pressure at goal levels.    Lipids  Yes    Intervention  Provide education and support for participant on nutrition & aerobic/resistive exercise along with prescribed medications to achieve LDL 70mg , HDL >40mg .    Expected Outcomes  Short Term: Participant states understanding of desired cholesterol values and is compliant with medications prescribed. Participant is following exercise prescription and nutrition guidelines.;Long Term: Cholesterol controlled with medications as prescribed, with individualized exercise RX and with personalized nutrition plan. Value goals: LDL < 70mg , HDL > 40 mg.    Stress  Yes    Intervention  Offer individual and/or small group education and counseling on adjustment to heart disease, stress management and health-related lifestyle change. Teach and support self-help strategies.;Refer participants experiencing significant psychosocial distress to appropriate mental health specialists for further evaluation and treatment. When possible, include family members and significant others in education/counseling sessions.     Expected Outcomes  Short Term: Participant demonstrates changes in health-related behavior, relaxation and other stress management skills, ability to obtain effective social support, and compliance with psychotropic medications if prescribed.;Long Term: Emotional wellbeing is indicated by absence of clinically significant psychosocial distress or social isolation.        Personal Goals Discharge: Goals and Risk Factor Review    Row Name 07/17/18 0805 08/07/18 1348           Core Components/Risk Factors/Patient Goals Review   Personal Goals Review  Weight Management/Obesity;Diabetes;Hypertension;Stress;Lipids  Weight Management/Obesity;Diabetes;Hypertension;Stress;Lipids      Review  Pt with multiple CAD RFs willing to participate in CR exercise.  Angelamarie would like to lose weight.  Pt continues to tolerate exercise well.  Exercise currently on hold as department is closed per recommended guidelines from federal government to prevent spread of COVID-19.  Rayma would like to try the Airdyne bike when cardiac rehab reopens.       Expected Outcomes  Pt will continue to participate in CR exercise, nutrition, and lifestyle modification opportunities.   Pt will continue to participate in CR exercise, nutrition, and lifestyle modification opportunities.          Exercise Goals and Review: Exercise Goals    Row Name 07/11/18 1029             Exercise Goals   Increase Physical Activity  Yes       Intervention  Provide advice, education, support and counseling about physical activity/exercise needs.;Develop an individualized exercise prescription for aerobic and resistive training based on initial evaluation findings, risk stratification, comorbidities and participant's personal goals.  Expected Outcomes  Short Term: Attend rehab on a regular basis to increase amount of physical activity.       Increase Strength and Stamina  Yes       Intervention  Provide advice, education, support and  counseling about physical activity/exercise needs.;Develop an individualized exercise prescription for aerobic and resistive training based on initial evaluation findings, risk stratification, comorbidities and participant's personal goals.       Expected Outcomes  Short Term: Increase workloads from initial exercise prescription for resistance, speed, and METs.       Able to understand and use rate of perceived exertion (RPE) scale  Yes       Intervention  Provide education and explanation on how to use RPE scale       Expected Outcomes  Short Term: Able to use RPE daily in rehab to express subjective intensity level;Long Term:  Able to use RPE to guide intensity level when exercising independently       Knowledge and understanding of Target Heart Rate Range (THRR)  Yes       Intervention  Provide education and explanation of THRR including how the numbers were predicted and where they are located for reference       Expected Outcomes  Short Term: Able to state/look up THRR;Long Term: Able to use THRR to govern intensity when exercising independently;Short Term: Able to use daily as guideline for intensity in rehab       Able to check pulse independently  Yes       Intervention  Provide education and demonstration on how to check pulse in carotid and radial arteries.;Review the importance of being able to check your own pulse for safety during independent exercise       Expected Outcomes  Short Term: Able to explain why pulse checking is important during independent exercise;Long Term: Able to check pulse independently and accurately       Understanding of Exercise Prescription  Yes       Intervention  Provide education, explanation, and written materials on patient's individual exercise prescription       Expected Outcomes  Short Term: Able to explain program exercise prescription;Long Term: Able to explain home exercise prescription to exercise independently          Exercise Goals  Re-Evaluation:   Nutrition & Weight - Outcomes: Pre Biometrics - 07/11/18 1030      Pre Biometrics   Height  5\' 4"  (1.626 m)    Weight  74.6 kg    Waist Circumference  38.5 inches    Hip Circumference  39 inches    Waist to Hip Ratio  0.99 %    BMI (Calculated)  28.22    Triceps Skinfold  10 mm    % Body Fat  35 %    Grip Strength  32 kg    Flexibility  13 in    Single Leg Stand  30 seconds        Nutrition: Nutrition Therapy & Goals - 07/11/18 1050      Nutrition Therapy   Diet  Heart healthy, carb modified      Personal Nutrition Goals   Nutrition Goal  Pt to identify and limit food sources of saturated fat, trans fat, refined carbohydrates and sodium    Personal Goal #2  Pt to identify food quantities necessary to achieve weight loss of 6-24 lb at graduation from cardiac rehab    Personal Goal #3  Pt to build a  healthy plate including vegetables, fruits, whole grains, and low-fat dairy products in a heart healthy meal plan    Personal Goal #4  Pt to weigh and measure serving  sizes      Intervention Plan   Intervention  Prescribe, educate and counsel regarding individualized specific dietary modifications aiming towards targeted core components such as weight, hypertension, lipid management, diabetes, heart failure and other comorbidities.    Expected Outcomes  Short Term Goal: Understand basic principles of dietary content, such as calories, fat, sodium, cholesterol and nutrients.;Long Term Goal: Adherence to prescribed nutrition plan.       Nutrition Discharge: Nutrition Assessments - 07/11/18 1056      Rate Your Plate Scores   Pre Score  23       Education Questionnaire Score: Knowledge Questionnaire Score - 07/11/18 0749      Knowledge Questionnaire Score   Pre Score  18/24       Goals reviewed with patient; copy given to patient.

## 2018-11-19 ENCOUNTER — Other Ambulatory Visit (INDEPENDENT_AMBULATORY_CARE_PROVIDER_SITE_OTHER): Payer: Medicare Other

## 2018-11-19 ENCOUNTER — Other Ambulatory Visit: Payer: Self-pay

## 2018-11-19 DIAGNOSIS — E119 Type 2 diabetes mellitus without complications: Secondary | ICD-10-CM

## 2018-11-19 DIAGNOSIS — E063 Autoimmune thyroiditis: Secondary | ICD-10-CM | POA: Diagnosis not present

## 2018-11-19 LAB — TSH: TSH: 2.94 u[IU]/mL (ref 0.35–4.50)

## 2018-11-19 LAB — BASIC METABOLIC PANEL
BUN: 14 mg/dL (ref 6–23)
CO2: 27 mEq/L (ref 19–32)
Calcium: 9.4 mg/dL (ref 8.4–10.5)
Chloride: 100 mEq/L (ref 96–112)
Creatinine, Ser: 0.68 mg/dL (ref 0.40–1.20)
GFR: 86.53 mL/min (ref >60.00)
Glucose, Bld: 193 mg/dL — ABNORMAL HIGH (ref 70–99)
Potassium: 4.4 mEq/L (ref 3.5–5.1)
Sodium: 136 mEq/L (ref 135–145)

## 2018-11-19 LAB — HEMOGLOBIN A1C: Hgb A1c MFr Bld: 6.5 % (ref 4.6–6.5)

## 2018-11-21 ENCOUNTER — Ambulatory Visit (INDEPENDENT_AMBULATORY_CARE_PROVIDER_SITE_OTHER): Payer: Medicare Other | Admitting: Endocrinology

## 2018-11-21 ENCOUNTER — Other Ambulatory Visit: Payer: Self-pay

## 2018-11-21 ENCOUNTER — Encounter: Payer: Self-pay | Admitting: Endocrinology

## 2018-11-21 VITALS — BP 150/82 | HR 75 | Ht 63.5 in | Wt 167.0 lb

## 2018-11-21 DIAGNOSIS — E119 Type 2 diabetes mellitus without complications: Secondary | ICD-10-CM

## 2018-11-21 DIAGNOSIS — E063 Autoimmune thyroiditis: Secondary | ICD-10-CM

## 2018-11-21 DIAGNOSIS — I2510 Atherosclerotic heart disease of native coronary artery without angina pectoris: Secondary | ICD-10-CM

## 2018-11-21 DIAGNOSIS — I10 Essential (primary) hypertension: Secondary | ICD-10-CM | POA: Diagnosis not present

## 2018-11-21 NOTE — Progress Notes (Signed)
Patient ID: Joanna [name], female   DOB: 1951-01-08, 68 y.o.   MRN: [id number]           Reason for Appointment: Follow-up of diabetes  Referring physician: Deborra Medina  History of Present Illness:          Diagnosis: Type 2 diabetes mellitus, date of diagnosis: 2010?       Prior history:  On her initial consultation she was advised to start Tanzeum in addition to metformin for multiple benefits including long-term control; was started in 12/15.  Recent history:  A1c is 6.5 compared to 5.9        Non-insulin hypoglycemic drugs the patient is taking are: Metformin 1 g 2x  a day, Trulicity 1.5 mg weekly, Amaryl 0.5 mg at bedtime.     Glucose patterns and problems identified:  She again did not bring her blood sugar monitor  In 3/20 her blood sugars were tending to be low normal and she would feel shaky with this at times, especially late afternoon  With taking only half a tablet of Amaryl 1 mg symptoms are not present now  Blood sugars were reviewed by patient recall and she thinks they are not over 150 at any time  However lab glucose was 193, she had coffee with creamer that morning  Previously was able to do some regular walking with cardiac rehab but now is not doing much activity and not motivated to go out and walk  Not complaining about the cost of Trulicity now and is taking this consistently  Has not been able to lose any weight recently even though she thinks her diet is usually fairly good    Side effects from medications have been: none Compliance with the medical regimen: fair    Glucose monitoring:  done once or twice a day         Glucometer: One Touch ultra mini,    Blood Glucose readings  from recall  Fasting usually 128-150 range, probably averaging 140 PC 90-150, higher after dinner  Self-care: The diet that the patient has been following is: tries to limit fat intake.      Meals: 3 meals per day. Breakfast is eggs, , sometimes cottage cheese   Lunch is a  sandwich or soup.  Snacks: Cottage cheese and fruit, no sweet drinks juices              Dietician visit, most recent: never     CDE visit: 1/16            Weight history:    Wt Readings from Last 3 Encounters:  11/21/18 167 lb (75.8 kg)  08/01/18 165 lb 6.4 oz (75 kg)  07/22/18 165 lb 12.8 oz (75.2 kg)    Glycemic control:   Lab Results  Component Value Date   HGBA1C 6.5 11/19/2018   HGBA1C 5.9 07/18/2018   HGBA1C 5.5 05/08/2018   Lab Results  Component Value Date   MICROALBUR 5.3 (H) 04/11/2018   LDLCALC 60 06/10/2018   CREATININE 0.68 11/19/2018   Lab Results  Component Value Date   FRUCTOSAMINE 247 06/02/2016   FRUCTOSAMINE 274 (H) 07/16/2014    Past diabetes history:  For several years prior to her diagnosis she had been complaining of her feet burning Her blood sugars were mildly increased initially at diagnosis and she did try to control it with diet alone and exercise without getting them back to normal.  Her A1c had continue to be around 7% with the  lowest one 6.7  About 2012 she was started on metformin probably when her A1c was 7.3.   In 2015 her blood sugars had been higher with A1c 8.1%  HYPERLIPIDEMIA: Discussed in review of systems    Allergies as of 11/21/2018      Reactions   Crestor [rosuvastatin]    REACTION: N/V and heartburn   Lipitor [atorvastatin]    REACTION: Hot flashes and flu-like symptoms   Pravastatin Sodium    REACTION: Neck swelling and pain in her shoulders and arms.   Sulfonamide Derivatives    Rxn Unknown   Zetia [ezetimibe]    GI upset   Zocor [simvastatin] Other (See Comments)   REACTION: GI.Marland Kitchen Pt unknown of severity   Niacin    REACTION: n/v      Medication List       Accurate as of November 21, 2018  3:08 PM. If you have any questions, ask your nurse or doctor.        amLODipine 5 MG tablet Commonly known as: NORVASC Take 1 tablet (5 mg total) by mouth daily.   aspirin EC 81 MG tablet Take 1 tablet (81 mg total)  by mouth daily.   carvedilol 6.25 MG tablet Commonly known as: COREG Take 1 tablet (6.25 mg total) by mouth 2 (two) times daily.   cholecalciferol 1000 units tablet Commonly known as: VITAMIN D Take 2,000 Units by mouth daily.   Dulaglutide 1.5 MG/0.5ML Sopn Commonly known as: Advertising account planner CONTENTS OF PEN WEEKLY   glimepiride 1 MG tablet Commonly known as: AMARYL TAKE 1 TABLET BY MOUTH EVERYDAY AT BEDTIME What changed: See the new instructions.   glucose blood test strip Commonly known as: ONE TOUCH ULTRA TEST USE TO CHECK BLOOD SUGAR TWICE DAILY  Dx:E11.65   hydrocortisone 2.5 % rectal cream Commonly known as: ANUSOL-HC Use small amount in the perianal area 3 times daily as needed.   levothyroxine 112 MCG tablet Commonly known as: Synthroid Take 1 tablet (112 mcg total) by mouth daily before breakfast. What changed: Another medication with the same name was removed. Continue taking this medication, and follow the directions you see here. Changed by: Elayne Snare, MD   Magnesium 250 MG Tabs Take 1 tablet by mouth 2 (two) times daily.   metFORMIN 1000 MG tablet Commonly known as: GLUCOPHAGE TAKE 1 TABLET BY MOUTH TWICE A DAY   nitroGLYCERIN 0.4 MG SL tablet Commonly known as: NITROSTAT Place 1 tablet (0.4 mg total) under the tongue every 5 (five) minutes x 3 doses as needed for chest pain.   ramipril 10 MG capsule Commonly known as: ALTACE Take 1 capsule (10 mg total) by mouth daily.   rosuvastatin 5 MG tablet Commonly known as: CRESTOR Take 0.5 tablets (2.5 mg total) by mouth daily.   ticagrelor 90 MG Tabs tablet Commonly known as: BRILINTA Take 1 tablet (90 mg total) by mouth 2 (two) times daily.       Allergies:  Allergies  Allergen Reactions  . Crestor [Rosuvastatin]     REACTION: N/V and heartburn  . Lipitor [Atorvastatin]     REACTION: Hot flashes and flu-like symptoms  . Pravastatin Sodium     REACTION: Neck swelling and pain in her  shoulders and arms.  . Sulfonamide Derivatives     Rxn Unknown  . Zetia [Ezetimibe]     GI upset  . Zocor [Simvastatin] Other (See Comments)    REACTION: GI.Marland Kitchen Pt unknown of severity  . Niacin  REACTION: n/v    Past Medical History:  Diagnosis Date  . Abnormal liver function tests   . Adenomatous colon polyp   . Aortic stenosis, moderate 05/09/2018  . CAD (coronary artery disease) 05/09/2018   a. LHC 05/09/18: DES to mid/dist LAD, mod AS  . Cerebrovascular disease    a. CT 04/2016 -calcific plaque at the origin LEFT vertebral contributing to severe stenosis.  . Diverticulitis of colon   . Fatty liver    a. mild fatty liver infiltration by CT 2014.  Marland Kitchen Foot injury 2008   Left foot/leg with ?? torn muscle -PROBLEM HAS RESOLVED  . Hand pain 05/2007   steroid injections--RESOLVED  . Heart murmur   . History of kidney stones   . Hx of renal calculi    LEFT  . Hyperlipidemia   . Hypertension   . Hypothyroidism    pt states hx of thyroid nodules  . IBS (irritable bowel syndrome)   . Internal hemorrhoids   . Melanoma ([place]) 1980s   mid upper stomach (05/08/2018)  . OSA on CPAP     SETTING IS 14 (05/08/2018)  . Statin intolerance   . Stroke [facility])    a. right thalamic infarct in 04/2016 felt by neuro to be due to small vessel disease.  . Type II diabetes mellitus ([place])     Past Surgical History:  Procedure Laterality Date  . CARDIAC CATHETERIZATION    . CATARACT EXTRACTION W/ INTRAOCULAR LENS  IMPLANT, BILATERAL Bilateral 08/2016  . COLONOSCOPY W/ BIOPSIES AND POLYPECTOMY  05/19/2005   adenomatous polyps  . CORONARY STENT INTERVENTION N/A 05/09/2018   Procedure: CORONARY STENT INTERVENTION;  Surgeon: Nelva Bush, MD;  Location: [place] [facility];  Service: Cardiovascular;  Laterality: N/A;  . LEFT HEART CATH AND CORONARY ANGIOGRAPHY N/A 05/09/2018   Procedure: LEFT HEART CATH AND CORONARY ANGIOGRAPHY;  Surgeon: Nelva Bush, MD;  Location: [place] [facility];   Service: Cardiovascular;  Laterality: N/A;  . MELANOMA EXCISION  1980s   mid upper stomach  . NEPHROLITHOTOMY Left 01/27/2013   Procedure: NEPHROLITHOTOMY PERCUTANEOUS;  Surgeon: Dutch Gray, MD;  Location: [facility];  Service: Urology;  Laterality: Left;  . TUBAL LIGATION    . WISDOM TEETH EXTRACTED      Family History  Problem Relation Age of Onset  . Hyperlipidemia Mother   . Neurofibromatosis Father   . Thyroid disease Daughter   . Hyperlipidemia Sister   . Hyperlipidemia Brother   . Diabetes Neg Hx   . Stroke Neg Hx   . Colon cancer Neg Hx   . Esophageal cancer Neg Hx   . Pancreatic cancer Neg Hx   . Liver cancer Neg Hx     Social History:  reports that she has never smoked. She has never used smokeless tobacco. She reports that she does not drink alcohol or use drugs.     Review of Systems        LIPIDS: she has long-standing severe hypercholesterolemia and has been intolerant to all statin drugs with various reactions Lipitor caused flulike symptoms, abdominal bloating and hot flashes, pravastatin caused neck swelling and upper body pain, Zocor caused GI upset.  Was not able to tolerate Zetia as she thinks it upset his stomach She did not want to continue Repatha because of cost  LDL particle number previously over 2500 with ideal levels of below 1000  Currently taking 2.5 mg Crestor every day and followed by cardiology PA, No side effects with this currently  LDL is better than expected  Also taking small doses of OTC fish oil      Lab Results  Component Value Date   CHOL 120 06/10/2018   HDL 34 (L) 06/10/2018   LDLCALC 60 06/10/2018   LDLDIRECT 209 (H) 03/19/2018   TRIG 128 06/10/2018   CHOLHDL 3.5 06/10/2018               Thyroid:    She has had hypothyroidism for about 10 years and initially had a goiter also; highest TSH probably about 9.2 She prefers to take Brand name Synthroid and she thinks that levothyroxine does not work  She takes this regularly  before breakfast She feels fairly good overall usually  Her TSH is back to normal more consistently now Thyroid functions as below:  Lab Results  Component Value Date   TSH 2.94 11/19/2018   TSH 2.52 07/18/2018   TSH 1.908 05/08/2018   FREET4 0.85 07/18/2018   FREET4 1.15 05/08/2018   FREET4 0.82 04/11/2018        The blood pressure is being treated with 5 mg amlodipine and 10 mg ramipril along with metoprolol This is also followed by cardiologist She tends to have high reading on the right side  Home blood pressure typically: 145/80 am and 135/75 in p.m. She thinks her blood pressure is higher today because of stress at work  BP Readings from Last 3 Encounters:  11/21/18 (!) 150/82  08/01/18 130/74  07/22/18 136/82       LABS:  Lab on 11/19/2018  Component Date Value Ref Range Status  . TSH 11/19/2018 2.94  0.35 - 4.50 uIU/mL Final  . Sodium 11/19/2018 136  135 - 145 mEq/L Final  . Potassium 11/19/2018 4.4  3.5 - 5.1 mEq/L Final  . Chloride 11/19/2018 100  96 - 112 mEq/L Final  . CO2 11/19/2018 27  19 - 32 mEq/L Final  . Glucose, Bld 11/19/2018 193* 70 - 99 mg/dL Final  . BUN 11/19/2018 14  6 - 23 mg/dL Final  . Creatinine, Ser 11/19/2018 0.68  0.40 - 1.20 mg/dL Final  . Calcium 11/19/2018 9.4  8.4 - 10.5 mg/dL Final  . GFR 11/19/2018 86.53  >60.00 mL/min Final  . Hgb A1c MFr Bld 11/19/2018 6.5  4.6 - 6.5 % Final   Glycemic Control Guidelines for People with Diabetes:Non Diabetic:  <6%Goal of Therapy: <7%Additional Action Suggested:  >8%     Physical Examination:  BP (!) 150/82 (BP Location: Right Arm, Patient Position: Sitting, Cuff Size: Normal)   Pulse 75   Ht 5' 3.5" (1.613 m)   Wt 167 lb (75.8 kg)   SpO2 96%   BMI 29.12 kg/m            ASSESSMENT/PLAN:   Diabetes type 2 with BMI 28  See history of present illness for details of her current blood sugar patterns and management  A1c is relatively higher but still excellent at 6.5  Blood sugars  may not be low normal recently with taking only half a tablet of Amaryl However she reports fairly good blood sugars at home even though lab glucose was 193 Her main difficulty is not getting much exercise recently and not losing weight    Recommendations:  She will continue Amaryl 0.5 mg bedtime  Discussed blood sugar targets both fasting and after meals  She needs to bring her meter for review on each visit  Regular walking program to be started  To call if blood  sugars are consistently higher or lower  Follow-up in 6 months   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    Elayne Snare 11/21/2018, 3:08 PM   Note: This office note was prepared with Dragon voice recognition system technology. Any transcriptional errors that result from this process are unintentional.

## 2018-11-21 NOTE — Patient Instructions (Signed)
Take amlodipine in pm  Check blood sugars on waking up 2 days a week  Also check blood sugars about 2 hours after meals and do this after different meals by rotation  Recommended blood sugar levels on waking up are 90-130 and about 2 hours after meal is 130-160  Please bring your blood sugar monitor to each visit, thank you

## 2019-02-06 NOTE — Progress Notes (Signed)
CARDIOLOGY OFFICE NOTE  Date:  02/11/2019    Joanna [name] Date of Birth: 10/28/1950 Medical Record [id number]  PCP:  [name], MD  Cardiologist:  [facility]  Chief Complaint  Patient presents with   Follow-up    History of Present Illness: Joanna [name] is a 68 y.o. female who presents today for a 6 month check. Seen for Dr. Marlou Porch. Former patient of Dr. Darnelle Bos.   She has a history of CAD with prior LAD stent, moderate AS, prior right thalamic infarct in 2017 with persistent left arm discomfort and HLD with statin intolerance. When seen last October of 2019 - having arm pain/DOE and more prominent murmur - echo was updated - moderate AS noted - cardiac CT ordered and showed diffuse CAD with 50-69% stenosis in the ostial RCA, 50-69% stenosis in the mid LAD and in a proximal portion of a small diagonal artery, study affected by motion. FFR analysis showed significant stenosis in the mid LAD.Shedidnote that her overall discomfort seemed to improve significantly when administered nitroglycerin for the study.  She ended up being referred for cardiac cath in 04/2018 and had single-vessel CAD with 90% mid/distal LAD stenosis.She underwent successful PCI and DES to the LAD. She was also found to have moderate aortic valve stenosis at time of her cath.  She was last seen here in March by Dr. Marlou Porch.   The patient does not have symptoms concerning for COVID-19 infection (fever, chills, cough, or new shortness of breath).   Comes in today. Here alone. She is asking about getting labs. She is fasting today. She continues to have intermittent left arm pain. She has persistent numbness as well. Seems to sometimes be worse with activity. She was not able to complete rehab due to [place]. She was less active with the stay at home order. She gained weight - now trying a different diet to lose weight. Sporadically exercising. No real shortness of breath. She has cramps in her chest with movement.  She notes her symptoms get worse with anxiety. She is only taking her Crestor 2.5 mg every other night due to feeling like her feet were burning. BP fine at home. Sometimes her whole left side of her body is "heavy". She has not used any NTG. She wonders how much the mental fatigue with the current pandemic plays a role. She is not really able to tell me if this is progressive since last seen here. Seemed to be unaware about her aortic stenosis. No more swelling in her feet since the reduction of her Norvasc.    Past Medical History:  Diagnosis Date   Abnormal liver function tests    Adenomatous colon polyp    Aortic stenosis, moderate 05/09/2018   CAD (coronary artery disease) 05/09/2018   a. LHC 05/09/18: DES to mid/dist LAD, mod AS   Cerebrovascular disease    a. CT 04/2016 -calcific plaque at the origin LEFT vertebral contributing to severe stenosis.   Diverticulitis of colon    Fatty liver    a. mild fatty liver infiltration by CT 2014.   Foot injury 2008   Left foot/leg with ?? torn muscle -PROBLEM HAS RESOLVED   Hand pain 05/2007   steroid injections--RESOLVED   Heart murmur    History of kidney stones    Hx of renal calculi    LEFT   Hyperlipidemia    Hypertension    Hypothyroidism    pt states hx of thyroid nodules  IBS (irritable bowel syndrome)    Internal hemorrhoids    Melanoma ([place]) 1980s   mid upper stomach (05/08/2018)   OSA on CPAP     SETTING IS 14 (05/08/2018)   Statin intolerance    Stroke ([place])    a. right thalamic infarct in 04/2016 felt by neuro to be due to small vessel disease.   Type II diabetes mellitus ([place])     Past Surgical History:  Procedure Laterality Date   CARDIAC CATHETERIZATION     CATARACT EXTRACTION W/ INTRAOCULAR LENS  IMPLANT, BILATERAL Bilateral 08/2016   COLONOSCOPY W/ BIOPSIES AND POLYPECTOMY  05/19/2005   adenomatous polyps   CORONARY STENT INTERVENTION N/A 05/09/2018   Procedure: CORONARY STENT  INTERVENTION;  Surgeon: Nelva Bush, MD;  Location: [facility];  Service: Cardiovascular;  Laterality: N/A;   LEFT HEART CATH AND CORONARY ANGIOGRAPHY N/A 05/09/2018   Procedure: LEFT HEART CATH AND CORONARY ANGIOGRAPHY;  Surgeon: Nelva Bush, MD;  Location: [facility];  Service: Cardiovascular;  Laterality: N/A;   MELANOMA EXCISION  1980s   mid upper stomach   NEPHROLITHOTOMY Left 01/27/2013   Procedure: NEPHROLITHOTOMY PERCUTANEOUS;  Surgeon: Dutch Gray, MD;  Location: [facility];  Service: Urology;  Laterality: Left;   TUBAL LIGATION     WISDOM TEETH EXTRACTED       Medications: Current Meds  Medication Sig   amLODipine (NORVASC) 10 MG tablet Take 5 mg by mouth daily.   aspirin EC 81 MG tablet Take 1 tablet (81 mg total) by mouth daily.   carvedilol (COREG) 6.25 MG tablet Take 1 tablet (6.25 mg total) by mouth 2 (two) times daily.   cholecalciferol (VITAMIN D) 1000 units tablet Take 2,000 Units by mouth daily.   Dulaglutide (TRULICITY) 1.5 0000000 SOPN INJECT CONTENTS OF PEN WEEKLY   glimepiride (AMARYL) 1 MG tablet Take 0.5 mg by mouth daily with breakfast.   glucose blood (ONE TOUCH ULTRA TEST) test strip USE TO CHECK BLOOD SUGAR TWICE DAILY  Dx:E11.65   hydrocortisone (ANUSOL-HC) 2.5 % rectal cream Use small amount in the perianal area 3 times daily as needed.   levothyroxine (SYNTHROID) 112 MCG tablet Take 1 tablet (112 mcg total) by mouth daily before breakfast.   Magnesium 250 MG TABS Take 1 tablet by mouth 2 (two) times daily.   metFORMIN (GLUCOPHAGE) 1000 MG tablet TAKE 1 TABLET BY MOUTH TWICE A DAY   nitroGLYCERIN (NITROSTAT) 0.4 MG SL tablet Place 1 tablet (0.4 mg total) under the tongue every 5 (five) minutes x 3 doses as needed for chest pain.   ramipril (ALTACE) 10 MG capsule Take 1 capsule (10 mg total) by mouth daily.   rosuvastatin (CRESTOR) 5 MG tablet Take 0.5 tablets (2.5 mg total) by mouth daily.   ticagrelor (BRILINTA) 90  MG TABS tablet Take 1 tablet (90 mg total) by mouth 2 (two) times daily.   [DISCONTINUED] amLODipine (NORVASC) 5 MG tablet Take 1 tablet (5 mg total) by mouth daily.     Allergies: Allergies  Allergen Reactions   Crestor [Rosuvastatin]     REACTION: N/V and heartburn   Lipitor [Atorvastatin]     REACTION: Hot flashes and flu-like symptoms   Pravastatin Sodium     REACTION: Neck swelling and pain in her shoulders and arms.   Sulfonamide Derivatives     Rxn Unknown   Zetia [Ezetimibe]     GI upset   Zocor [Simvastatin] Other (See Comments)    REACTION: GI.Marland Kitchen Pt unknown of  severity   Niacin     REACTION: n/v    Social History: The patient  reports that she has never smoked. She has never used smokeless tobacco. She reports that she does not drink alcohol or use drugs.   Family History: The patient's family history includes Hyperlipidemia in her brother, mother, and sister; Neurofibromatosis in her father; Thyroid disease in her daughter.   Review of Systems: Please see the history of present illness.   All other systems are reviewed and negative.   Physical Exam: VS:  BP 106/70    Pulse 80    Ht 5\' 4"  (1.626 m)    Wt 164 lb (74.4 kg)    SpO2 97%    BMI 28.15 kg/m  .  BMI Body mass index is 28.15 kg/m.  Wt Readings from Last 3 Encounters:  02/11/19 164 lb (74.4 kg)  11/21/18 167 lb (75.8 kg)  08/01/18 165 lb 6.4 oz (75 kg)   BP by me is 122/80  General: Alert and in no acute distress.   HEENT: Normal.  Neck: Supple, no JVD, carotid bruits, or masses noted.  Cardiac: Regular rate and rhythm. Harsh outflow murmur. No edema.  Respiratory:  Lungs are clear to auscultation bilaterally with normal work of breathing.  GI: Soft and nontender.  MS: No deformity or atrophy. Gait and ROM intact.  Skin: Warm and dry. Color is normal.  Neuro:  Strength and sensation are intact and no gross focal deficits noted.  Psych: Alert, appropriate and with normal  affect.   LABORATORY DATA:  EKG:  EKG is ordered today. This shows NSR with diffuse T wave changes - she has had these on prior tracings noted from last October.    Lab Results  Component Value Date   WBC 9.9 05/10/2018   HGB 13.3 05/10/2018   HCT 40.3 05/10/2018   PLT 288 05/10/2018   GLUCOSE 193 (H) 11/19/2018   CHOL 120 06/10/2018   TRIG 128 06/10/2018   HDL 34 (L) 06/10/2018   LDLDIRECT 209 (H) 03/19/2018   LDLCALC 60 06/10/2018   ALT 27 07/18/2018   AST 18 07/18/2018   NA 136 11/19/2018   K 4.4 11/19/2018   CL 100 11/19/2018   CREATININE 0.68 11/19/2018   BUN 14 11/19/2018   CO2 27 11/19/2018   TSH 2.94 11/19/2018   INR 0.98 05/10/2016   HGBA1C 6.5 11/19/2018   MICROALBUR 5.3 (H) 04/11/2018     BNP (last 3 results) No results for input(s): BNP in the last 8760 hours.  ProBNP (last 3 results) No results for input(s): PROBNP in the last 8760 hours.   Other Studies Reviewed Today:  LEFT HEART CATH AND CORONARY ANGIOGRAPHY12/19/19   Conclusions: 1. Severe single-vessel CAD with 90% mid/distal LAD stenosis. There are additional mild to moderate stenoses involving the mid and distal LAD. 2. Mild, non-obstructive OM and RCA disease. 3. Low left ventricular filling pressure. 4. Moderate aortic valve stenosis. 5. Successful PCI to 90% mid/distal LAD stenosis using Orsiro 2.5 x 13 mm DES with 0% residual stenosis and TIMI-3 flow.  Recommendations: 1. Dual antiplatelet therapy with aspirin and ticagrelor for up to 12 months. 2. Aggressive secondary prevention. 3. Outpatient follow-up of moderate aortic stenosis     Assessment/Plan:  1. CAD - prior PCI with DES to the LAD 04/2018 - remains on DAPT - her symptoms seem quite difficult to sort out. Had non obstructive disease otherwise. She really does not wish to be on more medicines -  she is going to see how more regular lifestyle changes affect her - if she is able to do this and feel better then we will stay  with the current course, if she is not, she will need further testing to include possible cath versus stress testing.   2. Moderate AS - noted by cath from 04/2018 - I do not get the sense that she has cardinal symptoms - this will need to be followed.   3. HTN - BP is fine - no changes made today.   4. HLD - on very little statin - just every other night - checking labs today - may need to consider other therapy versus referral to lipid clinic.   5. Prior lower extremity edema with reduction in Norvasc dose - this is resolved.   6. Prior stroke with residual symptoms - unclear how much this is playing a role.   7. COVID-19 Education: The signs and symptoms of COVID-19 were discussed with the patient and how to seek care for testing (follow up with PCP or arrange E-visit).  The importance of social distancing, staying at home, hand hygiene and wearing a mask when out in public were discussed today.  Current medicines are reviewed with the patient today.  The patient does not have concerns regarding medicines other than what has been noted above.  The following changes have been made:  See above.  Labs/ tests ordered today include:    Orders Placed This Encounter  Procedures   Basic metabolic panel   CBC   Hepatic function panel   Lipid panel   Magnesium     Disposition:   FU with Dr. Marlou Porch in 1 month for further discussion. She did ask my CMA at the end of the visit for a COVID test - unclear as to why - she was advised to speak with her PCP.   Patient is agreeable to this plan and will call if any problems develop in the interim.   SignedTruitt Merle, NP  02/11/2019 8:56 AM  [facility] [address] [place], [place]  [address] Phone: [phone] Fax: [phone]

## 2019-02-10 ENCOUNTER — Telehealth: Payer: Self-pay

## 2019-02-10 NOTE — Telephone Encounter (Signed)
She can check the cost of Ozempic, Rybelsus, Jardiance otherwise will have to go on insulin.  If she does have a change made she will need to follow-up about 6 weeks after switching

## 2019-02-10 NOTE — Telephone Encounter (Signed)
Called pt and gave her MD message. Pt verbalized understanding. 

## 2019-02-10 NOTE — Telephone Encounter (Signed)
Received fax from pts insurance stating that Trulicity is [id number], and they would like to know if you can change the medication.

## 2019-02-11 ENCOUNTER — Ambulatory Visit (INDEPENDENT_AMBULATORY_CARE_PROVIDER_SITE_OTHER): Payer: Medicare Other | Admitting: Nurse Practitioner

## 2019-02-11 ENCOUNTER — Other Ambulatory Visit: Payer: Self-pay | Admitting: Endocrinology

## 2019-02-11 ENCOUNTER — Encounter: Payer: Self-pay | Admitting: Nurse Practitioner

## 2019-02-11 ENCOUNTER — Other Ambulatory Visit: Payer: Self-pay

## 2019-02-11 VITALS — BP 106/70 | HR 80 | Ht 64.0 in | Wt 164.0 lb

## 2019-02-11 DIAGNOSIS — I350 Nonrheumatic aortic (valve) stenosis: Secondary | ICD-10-CM | POA: Diagnosis not present

## 2019-02-11 DIAGNOSIS — I2510 Atherosclerotic heart disease of native coronary artery without angina pectoris: Secondary | ICD-10-CM | POA: Diagnosis not present

## 2019-02-11 DIAGNOSIS — E785 Hyperlipidemia, unspecified: Secondary | ICD-10-CM

## 2019-02-11 DIAGNOSIS — M79602 Pain in left arm: Secondary | ICD-10-CM | POA: Diagnosis not present

## 2019-02-11 DIAGNOSIS — I10 Essential (primary) hypertension: Secondary | ICD-10-CM

## 2019-02-11 DIAGNOSIS — Z7189 Other specified counseling: Secondary | ICD-10-CM | POA: Diagnosis not present

## 2019-02-11 LAB — BASIC METABOLIC PANEL
BUN/Creatinine Ratio: 24 (ref 12–28)
BUN: 19 mg/dL (ref 8–27)
CO2: 26 mmol/L (ref 20–29)
Calcium: 11.2 mg/dL — ABNORMAL HIGH (ref 8.7–10.3)
Chloride: 97 mmol/L (ref 96–106)
Creatinine, Ser: 0.79 mg/dL (ref 0.57–1.00)
GFR calc Af Amer: 90 mL/min/{1.73_m2} (ref >59)
GFR calc non Af Amer: 78 mL/min/{1.73_m2} (ref >59)
Glucose: 99 mg/dL (ref 65–99)
Potassium: 5 mmol/L (ref 3.5–5.2)
Sodium: 139 mmol/L (ref 134–144)

## 2019-02-11 LAB — LIPID PANEL
Chol/HDL Ratio: 4.5 ratio — ABNORMAL HIGH (ref 0.0–4.4)
Cholesterol, Total: 186 mg/dL (ref 100–199)
HDL: 41 mg/dL (ref >39)
LDL Chol Calc (NIH): 113 mg/dL — ABNORMAL HIGH (ref 0–99)
Triglycerides: 180 mg/dL — ABNORMAL HIGH (ref 0–149)
VLDL Cholesterol Cal: 32 mg/dL (ref 5–40)

## 2019-02-11 LAB — HEPATIC FUNCTION PANEL
ALT: 50 IU/L — ABNORMAL HIGH (ref 0–32)
AST: 31 IU/L (ref 0–40)
Albumin: 5 g/dL — ABNORMAL HIGH (ref 3.8–4.8)
Alkaline Phosphatase: 83 IU/L (ref 39–117)
Bilirubin Total: 0.9 mg/dL (ref 0.0–1.2)
Bilirubin, Direct: 0.19 mg/dL (ref 0.00–0.40)
Total Protein: 7.7 g/dL (ref 6.0–8.5)

## 2019-02-11 LAB — CBC
Hematocrit: 40.6 % (ref 34.0–46.6)
Hemoglobin: 13.8 g/dL (ref 11.1–15.9)
MCH: 28.8 pg (ref 26.6–33.0)
MCHC: 34 g/dL (ref 31.5–35.7)
MCV: 85 fL (ref 79–97)
Platelets: 299 10*3/uL (ref 150–450)
RBC: 4.79 x10E6/uL (ref 3.77–5.28)
RDW: 13.1 % (ref 11.7–15.4)
WBC: 7.7 10*3/uL (ref 3.4–10.8)

## 2019-02-11 LAB — MAGNESIUM: Magnesium: 2.2 mg/dL (ref 1.6–2.3)

## 2019-02-11 NOTE — Patient Instructions (Addendum)
After Visit Summary:  We will be checking the following labs today - BMET, CBC, HPF, Lipids and Mg level   Medication Instructions:    Continue with your current medicines.    If you need a refill on your cardiac medications before your next appointment, please call your pharmacy.     Testing/Procedures To Be Arranged:  N/A  Follow-Up:   See Dr. Marlou Porch in one month    At [facility], you and your health needs are our priority.  As part of our continuing mission to provide you with exceptional heart care, we have created designated Provider Care Teams.  These Care Teams include your primary Cardiologist (physician) and Advanced Practice Providers (APPs -  Physician Assistants and Nurse Practitioners) who all work together to provide you with the care you need, when you need it.  Special Instructions:  . Stay safe, stay home, wash your hands for at least 20 seconds and wear a mask when out in public.  . It was good to talk with you today.  . Think about what we talked about - more regular exercise over this next month and lets see how you do - we may need to do further testing on return.    Call the [place] office at [phone] if you have any questions, problems or concerns.

## 2019-02-13 ENCOUNTER — Telehealth: Payer: Self-pay | Admitting: *Deleted

## 2019-02-13 NOTE — Telephone Encounter (Signed)
-----   Message from Burtis Junes, NP sent at 02/12/2019  7:50 AM EDT ----- Ok to report. Labs show that her calcium level is up some - she needs this repeated with PCP please. CBC is fine. Mild LFT elevation noted. LDL not at goal - but with LFTs lets stay on the current regimen - needs repeat LFTs on her return visit.  May need to consider referral to lipid clinic - can discuss further on her return visit with Dr. Marlou Porch

## 2019-02-13 NOTE — Telephone Encounter (Signed)
Call placed to pt re: lab results, left a message for pt to call back.  

## 2019-02-25 ENCOUNTER — Other Ambulatory Visit: Payer: Self-pay

## 2019-02-25 ENCOUNTER — Encounter: Payer: Self-pay | Admitting: *Deleted

## 2019-02-25 DIAGNOSIS — Z20828 Contact with and (suspected) exposure to other viral communicable diseases: Secondary | ICD-10-CM | POA: Diagnosis not present

## 2019-02-25 DIAGNOSIS — Z20822 Contact with and (suspected) exposure to covid-19: Secondary | ICD-10-CM

## 2019-02-27 LAB — NOVEL CORONAVIRUS, NAA: SARS-CoV-2, NAA: NOT DETECTED

## 2019-03-13 ENCOUNTER — Ambulatory Visit: Payer: Medicare Other | Admitting: Cardiology

## 2019-04-16 ENCOUNTER — Telehealth: Payer: Self-pay | Admitting: *Deleted

## 2019-04-16 DIAGNOSIS — R7989 Other specified abnormal findings of blood chemistry: Secondary | ICD-10-CM

## 2019-04-16 NOTE — Telephone Encounter (Signed)
Results sent via mychart after leaving couple of messages for pt to call back.

## 2019-04-16 NOTE — Telephone Encounter (Signed)
-----   Message from Burtis Junes, NP sent at 02/12/2019  7:50 AM EDT ----- Ok to report. Labs show that her calcium level is up some - she needs this repeated with PCP please. CBC is fine. Mild LFT elevation noted. LDL not at goal - but with LFTs lets stay on the current regimen - needs repeat LFTs on her return visit.  May need to consider referral to lipid clinic - can discuss further on her return visit with Dr. Marlou Porch

## 2019-04-22 ENCOUNTER — Other Ambulatory Visit: Payer: Self-pay

## 2019-04-22 ENCOUNTER — Encounter: Payer: Self-pay | Admitting: Cardiology

## 2019-04-22 ENCOUNTER — Ambulatory Visit (INDEPENDENT_AMBULATORY_CARE_PROVIDER_SITE_OTHER): Payer: Medicare Other | Admitting: Cardiology

## 2019-04-22 VITALS — BP 156/90 | HR 64 | Ht 64.0 in | Wt 169.0 lb

## 2019-04-22 DIAGNOSIS — E785 Hyperlipidemia, unspecified: Secondary | ICD-10-CM | POA: Diagnosis not present

## 2019-04-22 DIAGNOSIS — I2510 Atherosclerotic heart disease of native coronary artery without angina pectoris: Secondary | ICD-10-CM

## 2019-04-22 DIAGNOSIS — Z79899 Other long term (current) drug therapy: Secondary | ICD-10-CM

## 2019-04-22 DIAGNOSIS — R7989 Other specified abnormal findings of blood chemistry: Secondary | ICD-10-CM | POA: Diagnosis not present

## 2019-04-22 NOTE — Patient Instructions (Signed)
Medication Instructions:  You may discontinue your Britinta. Continue all other medications as listed.  *If you need a refill on your cardiac medications before your next appointment, please call your pharmacy*  Lab Work: Please have blood work today (CMP, Lipid) If you have labs (blood work) drawn today and your tests are completely normal, you will receive your results only by: Marland Kitchen MyChart Message (if you have MyChart) OR . A paper copy in the mail If you have any lab test that is abnormal or we need to change your treatment, we will call you to review the results.  Follow-Up: At [facility], you and your health needs are our priority.  As part of our continuing mission to provide you with exceptional heart care, we have created designated Provider Care Teams.  These Care Teams include your primary Cardiologist (physician) and Advanced Practice Providers (APPs -  Physician Assistants and Nurse Practitioners) who all work together to provide you with the care you need, when you need it.  Your next appointment:   1 year(s)  The format for your next appointment:   In Person  Provider:   Candee Furbish, MD  Thank you for choosing [facility]!!

## 2019-04-22 NOTE — Progress Notes (Signed)
Cardiology Office Note:    Date:  04/22/2019   ID:  Joanna [name], DOB 15-Jun-1950, MRN [id number]  PCP:  [name], MD  Cardiologist:  Candee Furbish, MD  Electrophysiologist:  None   Referring MD: [name], MD     History of Present Illness:    Joanna [name] is a 68 y.o. female former patient of Joanna [name], here for follow up of CAD, post LAD stent, moderate AS. Prior stroke 2018.   Has no angina, no syncope, nausea, myalgias  Minor ankle edema. ? Amlodipine  Joanna [name] an acute right thalamic infarct in 04/2016. She has had persistent left arm discomfort since her stroke. She was seen in our office on 03/19/2018 with persistent left arm discomfort/heaviness and she was noted to have some exertional dyspnea.She was noted to have a more prominent murmur.An echocardiogram on 03/25/2018 showed EF 65-70%, grade 1 DD, moderate AS, mild LAE.Blood pressure medications were titrated to address her symptoms and hypertension. A cardiac CT was ordered and showed diffuse CAD with 50-69% stenosis in the ostial RCA, 50-69% stenosis in the mid LAD and in a proximal portion of a small diagonal artery, study affected by motion. FFR analysis showed significant stenosis in the mid LAD.Shedidnote that her overall discomfort seemed to improve significantly when administered nitroglycerin for the study.  Joanna [name] cardiac catheterization on 05/09/2018 that revealed severe single-vessel CAD with 90% mid/distal LAD stenosis.She underwent successful PCI and DES to the LAD. She was also found to have moderate aortic valve stenosis. Her blood pressure is a little elevated.We have started carvedilol low dose and can titrate at her follow-up appointment as needed. She has been started on dual antiplatelet therapy with aspirin and ticagrelor for up to 12 months.  The patient has had prior difficulties with multiple statins and she was agreeable to start a low dose of rosuvastatin at 5 mg.   Her insurance had denied PCSK9 inhibitors in the past but now she is open to trying this therapy.  04/22/2019 - atorvastatin "almost killed her" sweats, hot, sweats. Felt like the flu. 4-6 months off of it until better. Tried Zocor, atorvastatin in the past. Gained 30 pounds. 145-190 pounds.  Seems to be tolerating the low-dose Crestor 2.5 mg at night.  We will continue with this low-dose.  No description of chest pain or left arm pain.  Doing well.  No bleeding.  Past Medical History:  Diagnosis Date  . Abnormal liver function tests   . Adenomatous colon polyp   . Aortic stenosis, moderate 05/09/2018  . CAD (coronary artery disease) 05/09/2018   a. LHC 05/09/18: DES to mid/dist LAD, mod AS  . Cerebrovascular disease    a. CT 04/2016 -calcific plaque at the origin LEFT vertebral contributing to severe stenosis.  . Diverticulitis of colon   . Fatty liver    a. mild fatty liver infiltration by CT 2014.  Marland Kitchen Foot injury 2008   Left foot/leg with ?? torn muscle -PROBLEM HAS RESOLVED  . Hand pain 05/2007   steroid injections--RESOLVED  . Heart murmur   . History of kidney stones   . Hx of renal calculi    LEFT  . Hyperlipidemia   . Hypertension   . Hypothyroidism    pt states hx of thyroid nodules  . IBS (irritable bowel syndrome)   . Internal hemorrhoids   . Melanoma ([place]) 1980s   mid upper stomach (05/08/2018)  . OSA on CPAP     SETTING  IS 14 (05/08/2018)  . Statin intolerance   . Stroke Joanna [name])    a. right thalamic infarct in 04/2016 felt by neuro to be due to small vessel disease.  . Type II diabetes mellitus ([place])     Past Surgical History:  Procedure Laterality Date  . CARDIAC CATHETERIZATION    . CATARACT EXTRACTION W/ INTRAOCULAR LENS  IMPLANT, BILATERAL Bilateral 08/2016  . COLONOSCOPY W/ BIOPSIES AND POLYPECTOMY  05/19/2005   adenomatous polyps  . CORONARY STENT INTERVENTION N/A 05/09/2018   Procedure: CORONARY STENT INTERVENTION;  Surgeon: Nelva Bush, MD;   Location: [facility];  Service: Cardiovascular;  Laterality: N/A;  . LEFT HEART CATH AND CORONARY ANGIOGRAPHY N/A 05/09/2018   Procedure: LEFT HEART CATH AND CORONARY ANGIOGRAPHY;  Surgeon: Nelva Bush, MD;  Location: [facility];  Service: Cardiovascular;  Laterality: N/A;  . MELANOMA EXCISION  1980s   mid upper stomach  . NEPHROLITHOTOMY Left 01/27/2013   Procedure: NEPHROLITHOTOMY PERCUTANEOUS;  Surgeon: Dutch Gray, MD;  Location: [facility];  Service: Urology;  Laterality: Left;  . TUBAL LIGATION    . WISDOM TEETH EXTRACTED      Current Medications: Current Meds  Medication Sig  . amLODipine (NORVASC) 10 MG tablet Take 5 mg by mouth daily.  Marland Kitchen aspirin EC 81 MG tablet Take 1 tablet (81 mg total) by mouth daily.  . carvedilol (COREG) 6.25 MG tablet Take 1 tablet (6.25 mg total) by mouth 2 (two) times daily.  . cholecalciferol (VITAMIN D) 1000 units tablet Take 2,000 Units by mouth daily.  . Dulaglutide (TRULICITY) 1.5 KZ/9.9JT SOPN INJECT CONTENTS OF PEN WEEKLY  . glimepiride (AMARYL) 1 MG tablet Take 0.5 mg by mouth daily with breakfast.  . glucose blood (ONE TOUCH ULTRA TEST) test strip USE TO CHECK BLOOD SUGAR TWICE DAILY  Dx:E11.65  . levothyroxine (SYNTHROID) 112 MCG tablet Take 1 tablet (112 mcg total) by mouth daily before breakfast.  . Magnesium 250 MG TABS Take 1 tablet by mouth 2 (two) times daily.  . metFORMIN (GLUCOPHAGE) 1000 MG tablet TAKE 1 TABLET BY MOUTH TWICE A DAY  . nitroGLYCERIN (NITROSTAT) 0.4 MG SL tablet Place 1 tablet (0.4 mg total) under the tongue every 5 (five) minutes x 3 doses as needed for chest pain.  . ramipril (ALTACE) 10 MG capsule Take 1 capsule (10 mg total) by mouth daily.  . rosuvastatin (CRESTOR) 5 MG tablet Take 0.5 tablets (2.5 mg total) by mouth daily.  . [DISCONTINUED] ticagrelor (BRILINTA) 90 MG TABS tablet Take 1 tablet (90 mg total) by mouth 2 (two) times daily.     Allergies:   Crestor [rosuvastatin], Lipitor  [atorvastatin], Pravastatin sodium, Sulfonamide derivatives, Zetia [ezetimibe], Zocor [simvastatin], and Niacin   Social History   Socioeconomic History  . Marital status: Married    Spouse name: Not on file  . Number of children: 3  . Years of education: 54  . Highest education level: High school graduate  Occupational History  . Occupation: Producer, television/film/video: [employer]    Comment: sits most of day   Social Needs  . Financial resource strain: Not hard at all  . Food insecurity    Worry: Never true    Inability: Never true  . Transportation needs    Medical: No    Non-medical: No  Tobacco Use  . Smoking status: Never Smoker  . Smokeless tobacco: Never Used  Substance and Sexual Activity  . Alcohol use: Never  Frequency: Never  . Drug use: Never  . Sexual activity: Not Currently  Lifestyle  . Physical activity    Days per week: 5 days    Minutes per session: 30 min  . Stress: Not at all  Relationships  . Social Herbalist on phone: Not on file    Gets together: Not on file    Attends religious service: Not on file    Active member of club or organization: Not on file    Attends meetings of clubs or organizations: Not on file    Relationship status: Not on file  Other Topics Concern  . Not on file  Social History Narrative   Married, one son to daughters. She does office work. One caffeinated drink daily.   Updated as of 04/30/2013     Family History: The patient's family history includes Hyperlipidemia in her brother, mother, and sister; Neurofibromatosis in her father; Thyroid disease in her daughter. There is no history of Diabetes, Stroke, Colon cancer, Esophageal cancer, Pancreatic cancer, or Liver cancer.  ROS:   Please see the history of present illness.     All other systems reviewed and are negative.  EKGs/Labs/Other Studies Reviewed:    The following studies were reviewed today: Prior office notes, labs, cath  LEFT HEART CATH  AND CORONARY ANGIOGRAPHY12/19/19   Conclusions: 1. Severe single-vessel CAD with 90% mid/distal LAD stenosis. There are additional mild to moderate stenoses involving the mid and distal LAD. 2. Mild, non-obstructive OM and RCA disease. 3. Low left ventricular filling pressure. 4. Moderate aortic valve stenosis. 5. Successful PCI to 90% mid/distal LAD stenosis using Orsiro 2.5 x 13 mm DES with 0% residual stenosis and TIMI-3 flow.  Recommendations: 1. Dual antiplatelet therapy with aspirin and ticagrelor for up to 12 months. 2. Aggressive secondary prevention. 3. Outpatient follow-up of moderate aortic stenosis     EKG: Prior NSR no ischemic changes, personally viewed  Recent Labs: 11/19/2018: TSH 2.94 02/11/2019: ALT 50; BUN 19; Creatinine, Ser 0.79; Hemoglobin 13.8; Magnesium 2.2; Platelets 299; Potassium 5.0; Sodium 139  Recent Lipid Panel    Component Value Date/Time   CHOL 186 02/11/2019 0911   TRIG 180 (H) 02/11/2019 0911   HDL 41 02/11/2019 0911   CHOLHDL 4.5 (H) 02/11/2019 0911   CHOLHDL 5.4 05/09/2018 0512   VLDL 31 05/09/2018 0512   LDLCALC 113 (H) 02/11/2019 0911   LDLDIRECT 209 (H) 03/19/2018 1643   LDLDIRECT 170.0 12/11/2016 0943    Physical Exam:    VS:  BP (!) 156/90   Pulse 64   Ht 5' 4"  (1.626 m)   Wt 169 lb (76.7 kg)   SpO2 97%   BMI 29.01 kg/m     Wt Readings from Last 3 Encounters:  04/22/19 169 lb (76.7 kg)  02/11/19 164 lb (74.4 kg)  11/21/18 167 lb (75.8 kg)     GEN: Well nourished, well developed, in no acute distress  HEENT: normal  Neck: no JVD, carotid bruits, or masses Cardiac: RRR; 2/6 systolic murmur right upper sternal border, no rubs, or gallops,no edema  Respiratory:  clear to auscultation bilaterally, normal work of breathing GI: soft, nontender, nondistended, + BS Joanna: no deformity or atrophy  Skin: warm and dry, no rash Neuro:  Alert and Oriented x 3, Strength and sensation are intact Psych: euthymic mood, full affect    ASSESSMENT:    1. Coronary artery disease involving native coronary artery of native heart without angina pectoris  2. Elevated LFTs   3. Hyperlipidemia LDL goal <70   4. Medication management    PLAN:    In order of problems listed above:  Elevated liver function/ALT -Back on 05/08/2018 it was 66, 07/18/2018 was 27, 02/11/2019 was 50.  GERD if take early.  Repeat liver functions.  CAD  - doing well, secondary prevention, no change in meds reviewed  - PCI and DES to the LAD 05/09/2018.  Now ASA 81. OK to stop brilinta.  It is been 1 year since stent placement.  Moderate AS  - murmur on exam. No symptoms. Discussed potential impact. Mean gradient 19mHg.  Repeat echocardiogram in the future.  Essential hypertension/edema  -Mildly elevated today, usually under good control.  - decreased amlodipine to 525m hopefully help with edema  Hyperlipidemia  - Last LDL 60.  Atorvastatin and Zocor, intolerant.  Now on very low-dose Crestor 2.5 mg.  We will recheck lipids and metabolic profile to see how her liver functions are.  They were mildly elevated previously.  Stroke  - 04/2017.  Continue with secondary risk factor prevention.  One year f/u  Medication Adjustments/Labs and Tests Ordered: Current medicines are reviewed at length with the patient today.  Concerns regarding medicines are outlined above.  Orders Placed This Encounter  Procedures  . Comp Met (CMET)  . Lipid Profile   No orders of the defined types were placed in this encounter.   Patient Instructions  Medication Instructions:  You may discontinue your Britinta. Continue all other medications as listed.  *If you need a refill on your cardiac medications before your next appointment, please call your pharmacy*  Lab Work: Please have blood work today (CMP, Lipid) If you have labs (blood work) drawn today and your tests are completely normal, you will receive your results only by: . Marland KitchenyChart Message (if you have  MyChart) OR . A paper copy in the mail If you have any lab test that is abnormal or we need to change your treatment, we will call you to review the results.  Follow-Up: [facility] your health needs are our priority.  As part of our continuing mission to provide you with exceptional heart care, we have created designated Provider Care Teams.  These Care Teams include your primary Cardiologist (physician) and Advanced Practice Providers (APPs -  Physician Assistants and Nurse Practitioners) who all work together to provide you with the care you need, when you need it.  Your next appointment:   1 year(s)  The format for your next appointment:   In Person  Provider:   MaCandee FurbishMD  Thank you for choosing [name]         Signed, MaCandee FurbishMD  04/22/2019 5:17 PM    [place]

## 2019-04-23 ENCOUNTER — Other Ambulatory Visit: Payer: Self-pay | Admitting: Endocrinology

## 2019-04-23 ENCOUNTER — Telehealth: Payer: Self-pay

## 2019-04-23 LAB — LIPID PANEL
Chol/HDL Ratio: 4.3 ratio (ref 0.0–4.4)
Cholesterol, Total: 188 mg/dL (ref 100–199)
HDL: 44 mg/dL (ref >39)
LDL Chol Calc (NIH): 115 mg/dL — ABNORMAL HIGH (ref 0–99)
Triglycerides: 166 mg/dL — ABNORMAL HIGH (ref 0–149)
VLDL Cholesterol Cal: 29 mg/dL (ref 5–40)

## 2019-04-23 LAB — COMPREHENSIVE METABOLIC PANEL
ALT: 37 IU/L — ABNORMAL HIGH (ref 0–32)
AST: 22 IU/L (ref 0–40)
Albumin/Globulin Ratio: 2.1 (ref 1.2–2.2)
Albumin: 4.9 g/dL — ABNORMAL HIGH (ref 3.8–4.8)
Alkaline Phosphatase: 95 IU/L (ref 39–117)
BUN/Creatinine Ratio: 17 (ref 12–28)
BUN: 13 mg/dL (ref 8–27)
Bilirubin Total: 0.6 mg/dL (ref 0.0–1.2)
CO2: 28 mmol/L (ref 20–29)
Calcium: 10.1 mg/dL (ref 8.7–10.3)
Chloride: 101 mmol/L (ref 96–106)
Creatinine, Ser: 0.76 mg/dL (ref 0.57–1.00)
GFR calc Af Amer: 95 mL/min/{1.73_m2} (ref >59)
GFR calc non Af Amer: 82 mL/min/{1.73_m2} (ref >59)
Globulin, Total: 2.3 g/dL (ref 1.5–4.5)
Glucose: 90 mg/dL (ref 65–99)
Potassium: 4.5 mmol/L (ref 3.5–5.2)
Sodium: 142 mmol/L (ref 134–144)
Total Protein: 7.2 g/dL (ref 6.0–8.5)

## 2019-04-23 NOTE — Telephone Encounter (Signed)
-----   Message from Jerline Pain, MD sent at 04/23/2019  9:54 AM EST ----- Normal kidney function,overall normal liver function (ALT 37 just beyond the upper limits of normal, decreased from 50 on prior lab test)  LDL 115 (prior 113). Continue with Crestor.   Candee Furbish, MD

## 2019-04-23 NOTE — Telephone Encounter (Signed)
Notes recorded by Frederik Schmidt, RN on 04/23/2019 at 1:43 PM EST  [id number] 12/2  ------

## 2019-04-23 NOTE — Telephone Encounter (Signed)
LMTCB

## 2019-04-28 ENCOUNTER — Encounter: Payer: Self-pay | Admitting: *Deleted

## 2019-04-30 ENCOUNTER — Other Ambulatory Visit: Payer: Self-pay | Admitting: Physician Assistant

## 2019-05-08 ENCOUNTER — Other Ambulatory Visit: Payer: Self-pay | Admitting: Endocrinology

## 2019-05-09 ENCOUNTER — Other Ambulatory Visit: Payer: Self-pay | Admitting: Physician Assistant

## 2019-05-21 ENCOUNTER — Other Ambulatory Visit: Payer: Self-pay | Admitting: Endocrinology

## 2019-05-23 ENCOUNTER — Other Ambulatory Visit: Payer: Self-pay | Admitting: Physician Assistant

## 2019-05-23 ENCOUNTER — Other Ambulatory Visit: Payer: Self-pay | Admitting: Cardiology

## 2019-05-26 ENCOUNTER — Telehealth: Payer: Self-pay

## 2019-05-26 NOTE — Telephone Encounter (Signed)
Patient recently had labs done Apr 22, 2019 wants to know if we can use those labs instead of having her come in for labs and just make her appt for May 29, 2019 virtual     Please call and advise

## 2019-05-26 NOTE — Telephone Encounter (Signed)
She has not had her thyroid and diabetes labs tested.  Needs to do her labs but okay to do virtual visit

## 2019-05-26 NOTE — Telephone Encounter (Signed)
Please advise 

## 2019-05-27 ENCOUNTER — Other Ambulatory Visit: Payer: Medicare Other

## 2019-05-27 ENCOUNTER — Other Ambulatory Visit: Payer: Self-pay | Admitting: Endocrinology

## 2019-05-27 NOTE — Telephone Encounter (Signed)
She appears to have canceled her appointment this week,need to R/S

## 2019-05-29 ENCOUNTER — Ambulatory Visit: Payer: Medicare Other | Admitting: Endocrinology

## 2019-06-05 ENCOUNTER — Other Ambulatory Visit: Payer: Self-pay | Admitting: Endocrinology

## 2019-06-05 NOTE — Telephone Encounter (Signed)
Scheduled patient for labs and f/u VV

## 2019-06-06 ENCOUNTER — Other Ambulatory Visit (INDEPENDENT_AMBULATORY_CARE_PROVIDER_SITE_OTHER): Payer: Medicare Other

## 2019-06-06 ENCOUNTER — Other Ambulatory Visit: Payer: Self-pay

## 2019-06-06 DIAGNOSIS — E063 Autoimmune thyroiditis: Secondary | ICD-10-CM | POA: Diagnosis not present

## 2019-06-06 DIAGNOSIS — E119 Type 2 diabetes mellitus without complications: Secondary | ICD-10-CM

## 2019-06-06 LAB — MICROALBUMIN / CREATININE URINE RATIO
Creatinine,U: 56.2 mg/dL
Microalb Creat Ratio: 2.3 mg/g (ref 0.0–30.0)
Microalb, Ur: 1.3 mg/dL (ref 0.0–1.9)

## 2019-06-06 LAB — HEMOGLOBIN A1C: Hgb A1c MFr Bld: 6.5 % (ref 4.6–6.5)

## 2019-06-06 LAB — URINALYSIS, ROUTINE W REFLEX MICROSCOPIC
Bilirubin Urine: NEGATIVE
Hgb urine dipstick: NEGATIVE
Ketones, ur: NEGATIVE
Leukocytes,Ua: NEGATIVE
Nitrite: NEGATIVE
RBC / HPF: NONE SEEN (ref 0–?)
Specific Gravity, Urine: 1.02 (ref 1.000–1.030)
Total Protein, Urine: NEGATIVE
Urine Glucose: NEGATIVE
Urobilinogen, UA: 0.2 (ref 0.0–1.0)
WBC, UA: NONE SEEN (ref 0–?)
pH: 6.5 (ref 5.0–8.0)

## 2019-06-06 LAB — T4, FREE: Free T4: 0.66 ng/dL (ref 0.60–1.60)

## 2019-06-06 LAB — TSH: TSH: 22.56 u[IU]/mL — ABNORMAL HIGH (ref 0.35–4.50)

## 2019-06-10 ENCOUNTER — Other Ambulatory Visit: Payer: Self-pay

## 2019-06-10 ENCOUNTER — Encounter: Payer: Self-pay | Admitting: Endocrinology

## 2019-06-10 ENCOUNTER — Ambulatory Visit (INDEPENDENT_AMBULATORY_CARE_PROVIDER_SITE_OTHER): Payer: Medicare Other | Admitting: Endocrinology

## 2019-06-10 DIAGNOSIS — E1165 Type 2 diabetes mellitus with hyperglycemia: Secondary | ICD-10-CM

## 2019-06-10 DIAGNOSIS — E063 Autoimmune thyroiditis: Secondary | ICD-10-CM | POA: Diagnosis not present

## 2019-06-10 DIAGNOSIS — I10 Essential (primary) hypertension: Secondary | ICD-10-CM

## 2019-06-10 MED ORDER — SYNTHROID 125 MCG PO TABS: 125.0000 ug | ORAL_TABLET | Freq: Every day | ORAL | 1 refills | Status: DC

## 2019-06-10 MED ORDER — JARDIANCE 10 MG PO TABS: 10.0000 mg | ORAL_TABLET | Freq: Every day | ORAL | 3 refills | Status: DC

## 2019-06-10 NOTE — Patient Instructions (Addendum)
THYROID: New dose will be 125 mcg Synthroid to take by itself 30 minutes before breakfast DIABETES medicines: On the day you are due for your next Trulicity shot start Jardiance 1 tablet daily in the morning. With this will probably not need to take amlodipine and can hold this. Also increase fluid intake Note possibility of yeast infection because of higher urine sugar content with this  Glimepiride and Metformin to be taken at suppertime instead of bedtime Restart exercise when you are able to Please call to schedule follow-up in 6 weeks with labs and bring glucose monitor for download

## 2019-06-10 NOTE — Progress Notes (Signed)
Patient ID: Joanna [name], female   DOB: 29-Oct-1950, 69 y.o.   MRN: [id number]           Reason for Appointment: Follow-up of diabetes  I connected with the above-named patient by video enabled telemedicine application and verified that I am speaking with the correct person. The patient was explained the limitations of evaluation and management by telemedicine and the availability of in person appointments.  Patient also understood that there may be a patient responsible charge related to this service . Location of the patient: Patient's home . Location of the provider: Physician office Only the patient and myself were participating in the encounter The patient understood the above statements and agreed to proceed.  History of Present Illness:          Diagnosis: Type 2 diabetes mellitus, date of diagnosis: 2010?       Prior history:  On her initial consultation she was advised to start Tanzeum in addition to metformin for multiple benefits including long-term control; was started in 12/15.  Recent history:  A1c is 6.5 as before        Non-insulin hypoglycemic drugs the patient is taking are: Metformin 1 g 2x  a day, Trulicity 1.5 mg weekly, Amaryl 0.5 mg at bedtime.     Glucose patterns and problems identified:  She did not have specific readings from her meter to let us know today on her virtual visit  However she thinks her blood sugars 1 to 2 hours after dinner are usually 180-200, previously not reporting high readings  However she usually does not bring her monitor for download  She is taking Metformin and Amaryl at bedtime  Morning readings are 120-130 range  She was trying to do regular exercise with walking or biking before onset of cold weather  Also apparently had lost some weight but this has gone back up  She does not want to continue Trulicity now since she apparently is pain $300 monthly for this, last year had no difficulty with the cost on her last visit   As before she is usually trying to avoid high-fat meals    Side effects from medications have been: none Compliance with the medical regimen: fair    Glucose monitoring:  done once or twice a day         Glucometer: One Touch ultra mini,    Blood Glucose readings  from recall  Fasting usually 128-150 range, probably averaging 140 PC 90-150, higher after dinner  Self-care: The diet that the patient has been following is: tries to limit fat intake.      Meals: 3 meals per day. Breakfast is eggs, , sometimes cottage cheese   Lunch is a sandwich or soup.  Snacks: Cottage cheese and fruit, no sweet drinks juices           Dietician visit, most recent: never     CDE visit: 1/16            Weight history:    Wt Readings from Last 3 Encounters:  04/22/19 169 lb (76.7 kg)  02/11/19 164 lb (74.4 kg)  11/21/18 167 lb (75.8 kg)    Glycemic control:   Lab Results  Component Value Date   HGBA1C 6.5 06/06/2019   HGBA1C 6.5 11/19/2018   HGBA1C 5.9 07/18/2018   Lab Results  Component Value Date   MICROALBUR 1.3 06/06/2019   LDLCALC 115 (H) 04/22/2019   CREATININE 0.76 04/22/2019   Lab Results  Component  Value Date   FRUCTOSAMINE 247 06/02/2016   FRUCTOSAMINE 274 (H) 07/16/2014    Past diabetes history:  For several years prior to her diagnosis she had been complaining of her feet burning Her blood sugars were mildly increased initially at diagnosis and she did try to control it with diet alone and exercise without getting them back to normal.  Her A1c had continue to be around 7% with the lowest one 6.7  About 2012 she was started on metformin probably when her A1c was 7.3.   In 2015 her blood sugars had been higher with A1c 8.1%  HYPERLIPIDEMIA: Discussed in review of systems    Allergies as of 06/10/2019      Reactions   Crestor [rosuvastatin]    REACTION: N/V and heartburn   Lipitor [atorvastatin]    REACTION: Hot flashes and flu-like symptoms   Pravastatin Sodium     REACTION: Neck swelling and pain in her shoulders and arms.   Sulfonamide Derivatives    Rxn Unknown   Zetia [ezetimibe]    GI upset   Zocor [simvastatin] Other (See Comments)   REACTION: GI.Marland Kitchen Pt unknown of severity   Niacin    REACTION: n/v      Medication List       Accurate as of June 10, 2019  4:53 PM. If you have any questions, ask your nurse or doctor.        STOP taking these medications   levothyroxine 112 MCG tablet Commonly known as: Synthroid Replaced by: Synthroid 125 MCG tablet Stopped by: Elayne Snare, MD   Synthroid 100 MCG tablet Generic drug: levothyroxine Stopped by: Elayne Snare, MD     TAKE these medications   amLODipine 10 MG tablet Commonly known as: NORVASC Take 0.5 tablets (5 mg total) by mouth daily.   aspirin EC 81 MG tablet Take 1 tablet (81 mg total) by mouth daily.   carvedilol 6.25 MG tablet Commonly known as: COREG TAKE 1 TABLET BY MOUTH TWICE A DAY   cholecalciferol 1000 units tablet Commonly known as: VITAMIN D Take 2,000 Units by mouth daily.   Dulaglutide 1.5 MG/0.5ML Sopn Commonly known as: Advertising account planner CONTENTS OF PEN WEEKLY   glimepiride 1 MG tablet Commonly known as: AMARYL TAKE 1 TABLET BY MOUTH EVERYDAY AT BEDTIME What changed: See the new instructions.   glucose blood test strip Commonly known as: ONE TOUCH ULTRA TEST USE TO CHECK BLOOD SUGAR TWICE DAILY  Dx:E11.65   Jardiance 10 MG Tabs tablet Generic drug: empagliflozin Take 10 mg by mouth daily with breakfast. Started by: Elayne Snare, MD   Magnesium 250 MG Tabs Take 1 tablet by mouth 2 (two) times daily.   metFORMIN 1000 MG tablet Commonly known as: GLUCOPHAGE TAKE 1 TABLET BY MOUTH TWICE A DAY   nitroGLYCERIN 0.4 MG SL tablet Commonly known as: NITROSTAT Place 1 tablet (0.4 mg total) under the tongue every 5 (five) minutes x 3 doses as needed for chest pain.   ramipril 10 MG capsule Commonly known as: ALTACE TAKE 1 CAPSULE BY MOUTH EVERY DAY    rosuvastatin 5 MG tablet Commonly known as: CRESTOR TAKE 1/2 TABLET BY MOUTH DAILY   Synthroid 125 MCG tablet Generic drug: levothyroxine Take 1 tablet (125 mcg total) by mouth daily before breakfast. Replaces: levothyroxine 112 MCG tablet Started by: Elayne Snare, MD       Allergies:  Allergies  Allergen Reactions  . Crestor [Rosuvastatin]     REACTION: N/V and heartburn  .  Lipitor [Atorvastatin]     REACTION: Hot flashes and flu-like symptoms  . Pravastatin Sodium     REACTION: Neck swelling and pain in her shoulders and arms.  . Sulfonamide Derivatives     Rxn Unknown  . Zetia [Ezetimibe]     GI upset  . Zocor [Simvastatin] Other (See Comments)    REACTION: GI.Marland Kitchen Pt unknown of severity  . Niacin     REACTION: n/v    Past Medical History:  Diagnosis Date  . Abnormal liver function tests   . Adenomatous colon polyp   . Aortic stenosis, moderate 05/09/2018  . CAD (coronary artery disease) 05/09/2018   a. LHC 05/09/18: DES to mid/dist LAD, mod AS  . Cerebrovascular disease    a. CT 04/2016 -calcific plaque at the origin LEFT vertebral contributing to severe stenosis.  . Diverticulitis of colon   . Fatty liver    a. mild fatty liver infiltration by CT 2014.  Marland Kitchen Foot injury 2008   Left foot/leg with ?? torn muscle -PROBLEM HAS RESOLVED  . Hand pain 05/2007   steroid injections--RESOLVED  . Heart murmur   . History of kidney stones   . Hx of renal calculi    LEFT  . Hyperlipidemia   . Hypertension   . Hypothyroidism    pt states hx of thyroid nodules  . IBS (irritable bowel syndrome)   . Internal hemorrhoids   . Melanoma ([place]) 1980s   mid upper stomach (05/08/2018)  . OSA on CPAP     SETTING IS 14 (05/08/2018)  . Statin intolerance   . Stroke [facility])    a. right thalamic infarct in 04/2016 felt by neuro to be due to small vessel disease.  . Type II diabetes mellitus ([place])     Past Surgical History:  Procedure Laterality Date  . CARDIAC CATHETERIZATION     . CATARACT EXTRACTION W/ INTRAOCULAR LENS  IMPLANT, BILATERAL Bilateral 08/2016  . COLONOSCOPY W/ BIOPSIES AND POLYPECTOMY  05/19/2005   adenomatous polyps  . CORONARY STENT INTERVENTION N/A 05/09/2018   Procedure: CORONARY STENT INTERVENTION;  Surgeon: Nelva Bush, MD;  Location: [facility];  Service: Cardiovascular;  Laterality: N/A;  . LEFT HEART CATH AND CORONARY ANGIOGRAPHY N/A 05/09/2018   Procedure: LEFT HEART CATH AND CORONARY ANGIOGRAPHY;  Surgeon: Nelva Bush, MD;  Location: [place] [facility];  Service: Cardiovascular;  Laterality: N/A;  . MELANOMA EXCISION  1980s   mid upper stomach  . NEPHROLITHOTOMY Left 01/27/2013   Procedure: NEPHROLITHOTOMY PERCUTANEOUS;  Surgeon: Dutch Gray, MD;  Location: [facility];  Service: Urology;  Laterality: Left;  . TUBAL LIGATION    . WISDOM TEETH EXTRACTED      Family History  Problem Relation Age of Onset  . Hyperlipidemia Mother   . Neurofibromatosis Father   . Thyroid disease Daughter   . Hyperlipidemia Sister   . Hyperlipidemia Brother   . Diabetes Neg Hx   . Stroke Neg Hx   . Colon cancer Neg Hx   . Esophageal cancer Neg Hx   . Pancreatic cancer Neg Hx   . Liver cancer Neg Hx     Social History:  reports that she has never smoked. She has never used smokeless tobacco. She reports that she does not drink alcohol or use drugs.     Review of Systems        LIPIDS: she has long-standing severe hypercholesterolemia and has been intolerant to all statin drugs with various reactions Lipitor caused flulike symptoms,  abdominal bloating and hot flashes, pravastatin caused neck swelling and upper body pain, Zocor caused GI upset.  Was not able to tolerate Zetia as she thinks it upset his stomach She did not want to continue Repatha because of cost  LDL particle number previously over 2500 with ideal levels of below 1000  Is able to continue taking 2.5 mg Crestor every day and followed by cardiology PA, LDL as follows   Also taking small doses of OTC fish oil      Lab Results  Component Value Date   CHOL 188 04/22/2019   HDL 44 04/22/2019   LDLCALC 115 (H) 04/22/2019   LDLDIRECT 209 (H) 03/19/2018   TRIG 166 (H) 04/22/2019   CHOLHDL 4.3 04/22/2019               Thyroid:    She has had hypothyroidism for about 10 years and initially had a goiter also; highest TSH probably about 9.2 She prefers to take Brand name Synthroid and she thinks that levothyroxine does not work  Although she had been recommended 112 mcg of Synthroid daily in 11/19 she apparently has gone back to 100 mcg for several months  She takes this regularly up to 30 minutes before breakfast with no other supplements, vitamins or iron She was taking a herbal weight loss medication late last year but she stopped this when her blood pressure was higher in early December She will take her Metformin and blood pressure medication at the same time  She says for the last few weeks she has gradually become more tired and is feeling sleepy a lot Also feels excessively cold Her weight has gone up reportedly 10 pounds  Her TSH is markedly increased at 22.6  Thyroid functions as below:  Lab Results  Component Value Date   TSH 22.56 (H) 06/06/2019   TSH 2.94 11/19/2018   TSH 2.52 07/18/2018   FREET4 0.66 06/06/2019   FREET4 0.85 07/18/2018   FREET4 1.15 05/08/2018   Lab Results  Component Value Date   TSH 22.56 (H) 06/06/2019   TSH 2.94 11/19/2018   TSH 2.52 07/18/2018   TSH 1.908 05/08/2018   TSH 4.87 (H) 04/11/2018   TSH 5.390 (H) 03/19/2018        The blood pressure is being treated with 5 mg amlodipine and 10 mg ramipril along with metoprolol This is also followed by cardiologist She tends to have high reading on the right arm  Home blood pressure recently 138/78 She thinks her blood pressure was high in December from taking an herbal weight loss medication which has been stopped  BP Readings from Last 3 Encounters:   04/22/19 (!) 156/90  02/11/19 106/70  11/21/18 (!) 150/82       LABS:  Lab on 06/06/2019  Component Date Value Ref Range Status  . Color, Urine 06/06/2019 YELLOW  Yellow;Lt. Yellow;Straw;Dark Yellow;Amber;Green;Red;Brown Final  . APPearance 06/06/2019 CLEAR  Clear;Turbid;Slightly Cloudy;Cloudy Final  . Specific Gravity, Urine 06/06/2019 1.020  1.000 - 1.030 Final  . pH 06/06/2019 6.5  5.0 - 8.0 Final  . Total Protein, Urine 06/06/2019 NEGATIVE  Negative Final  . Urine Glucose 06/06/2019 NEGATIVE  Negative Final  . Ketones, ur 06/06/2019 NEGATIVE  Negative Final  . Bilirubin Urine 06/06/2019 NEGATIVE  Negative Final  . Hgb urine dipstick 06/06/2019 NEGATIVE  Negative Final  . Urobilinogen, UA 06/06/2019 0.2  0.0 - 1.0 Final  . Leukocytes,Ua 06/06/2019 NEGATIVE  Negative Final  . Nitrite 06/06/2019 NEGATIVE  Negative  Final  . WBC, UA 06/06/2019 none seen  0-2/hpf Final  . RBC / HPF 06/06/2019 none seen  0-2/hpf Final  . Squamous Epithelial / LPF 06/06/2019 Rare(0-4/hpf)  Rare(0-4/hpf) Final  . Microalb, Ur 06/06/2019 1.3  0.0 - 1.9 mg/dL Final  . Creatinine,U 06/06/2019 56.2  mg/dL Final  . Microalb Creat Ratio 06/06/2019 2.3  0.0 - 30.0 mg/g Final  . Free T4 06/06/2019 0.66  0.60 - 1.60 ng/dL Final   Comment: Specimens from patients who are undergoing biotin therapy and /or ingesting biotin supplements may contain high levels of biotin.  The higher biotin concentration in these specimens interferes with this Free T4 assay.  Specimens that contain high levels  of biotin may cause false high results for this Free T4 assay.  Please interpret results in light of the total clinical presentation of the patient.    Marland Kitchen TSH 06/06/2019 22.56* 0.35 - 4.50 uIU/mL Final  . Hgb A1c MFr Bld 06/06/2019 6.5  4.6 - 6.5 % Final   Glycemic Control Guidelines for People with Diabetes:Non Diabetic:  <6%Goal of Therapy: <7%Additional Action Suggested:  >8%     Physical Examination:  There were no  vitals taken for this visit.           ASSESSMENT/PLAN:   Diabetes type 2 with BMI 28  See history of present illness for details of her current blood sugar patterns and management  A1c is stable at 6.5  She is well controlled usually but reporting high readings up to 200 after dinner although not clear how often she monitors Previously had lost weight and was exercising regularly Now appears that she is not as active because of her hypothyroidism Recently has tried to do well with her diet  Recommendations:  She will continue Amaryl 0.5 mg but change this to dinnertime along with Metformin also  Since she cannot afford Trulicity she will switch to Jardiance if covered starting with 10 mg; this will be done a week after her next shot of Trulicity  At the same time she will stop 5 mg amlodipine since she likely will have improved blood pressure reading  She will monitor blood pressure regularly at home  Increase fluid intake  Discussed action of SGLT 2 drugs on lowering glucose by decreasing kidney absorption of glucose, benefits of weight loss and lower blood pressure, possible side effects including candidiasis and dosage regimen   Instructions were sent on my chart message for confirmation Follow-up labs including creatinine will be checked in 6 weeks  Familial hypercholesterolemia with LDL baseline over 200 LDL is higher and not clear if this is related partly to her hypothyroidism being out of control   HYPERTENSION without microalbuminuria: As above, she will monitor blood pressure closely with starting Jardiance and stopping amlodipine If her creatinine may tend to go up we will need to reduce her ramipril instead  HYPOTHYROIDISM: Her TSH is much higher and not clear if there is any interference with her herbal weight loss medicine in the last few weeks although she is not taking this now for some time She is symptomatic Discussed that we do need to increase the dose  to at least 125 mcg and she will continue brand-name Synthroid as requested She will follow-up in 6 weeks   Patient Instructions  THYROID: New dose will be 125 mcg Synthroid to take by itself 30 minutes before breakfast DIABETES medicines: On the day you are due for your next Trulicity shot start Jardiance 1 tablet  daily in the morning. With this will probably not need to take amlodipine and can hold this. Also increase fluid intake Note possibility of yeast infection because of higher urine sugar content with this  Glimepiride and Metformin to be taken at suppertime instead of bedtime Restart exercise when you are able to Please call to schedule follow-up in 6 weeks with labs and bring glucose monitor for download       Elayne Snare 06/10/2019, 4:53 PM   Note: This office note was prepared with Dragon voice recognition system technology. Any transcriptional errors that result from this process are unintentional.

## 2019-06-13 ENCOUNTER — Other Ambulatory Visit: Payer: Self-pay | Admitting: Endocrinology

## 2019-06-26 ENCOUNTER — Other Ambulatory Visit: Payer: Self-pay | Admitting: Endocrinology

## 2019-07-22 ENCOUNTER — Other Ambulatory Visit: Payer: Self-pay | Admitting: Endocrinology

## 2019-07-22 ENCOUNTER — Telehealth: Payer: Self-pay | Admitting: Endocrinology

## 2019-07-22 NOTE — Telephone Encounter (Signed)
ATC pt to set up FU and labs per last visit on 06/10/19  No answer and VM is full  Sending mychart message

## 2019-07-22 NOTE — Telephone Encounter (Signed)
-----   Message from Elayne Snare, MD sent at 07/22/2019  8:58 AM EST ----- Regarding: Needs follow-up Please schedule follow-up with labs, first available.  She was told to follow-up in 6 weeks after her last visit

## 2019-07-29 ENCOUNTER — Other Ambulatory Visit: Payer: Self-pay | Admitting: Endocrinology

## 2019-08-12 ENCOUNTER — Other Ambulatory Visit: Payer: Self-pay

## 2019-08-12 ENCOUNTER — Other Ambulatory Visit (INDEPENDENT_AMBULATORY_CARE_PROVIDER_SITE_OTHER): Payer: Medicare Other

## 2019-08-12 DIAGNOSIS — E063 Autoimmune thyroiditis: Secondary | ICD-10-CM

## 2019-08-12 DIAGNOSIS — E1165 Type 2 diabetes mellitus with hyperglycemia: Secondary | ICD-10-CM

## 2019-08-12 LAB — TSH: TSH: 1.54 u[IU]/mL (ref 0.35–4.50)

## 2019-08-12 LAB — BASIC METABOLIC PANEL
BUN: 12 mg/dL (ref 6–23)
CO2: 28 mEq/L (ref 19–32)
Calcium: 9.7 mg/dL (ref 8.4–10.5)
Chloride: 99 mEq/L (ref 96–112)
Creatinine, Ser: 0.68 mg/dL (ref 0.40–1.20)
GFR: 86.33 mL/min (ref >60.00)
Glucose, Bld: 167 mg/dL — ABNORMAL HIGH (ref 70–99)
Potassium: 4.2 mEq/L (ref 3.5–5.1)
Sodium: 138 mEq/L (ref 135–145)

## 2019-08-12 LAB — T4, FREE: Free T4: 1.08 ng/dL (ref 0.60–1.60)

## 2019-08-13 LAB — FRUCTOSAMINE: Fructosamine: 262 umol/L (ref 0–285)

## 2019-08-14 NOTE — Progress Notes (Signed)
Patient ID: Joanna [name], female   DOB: [birth year], 69 y.o.   MRN: [id number]           Reason for Appointment: Follow-up of endocrinology problems  I connected with the above-named patient by video enabled telemedicine application and verified that I am speaking with the correct person. The patient was explained the limitations of evaluation and management by telemedicine and the availability of in person appointments.  Patient also understood that there may be a patient responsible charge related to this service . Location of the patient: Patient's home . Location of the provider: Physician office Only the patient and myself were participating in the encounter The patient understood the above statements and agreed to proceed.  History of Present Illness:          Diagnosis: Type 2 diabetes mellitus, date of diagnosis: 2010?       Prior history:  On her initial consultation she was advised to start Tanzeum in addition to metformin for multiple benefits including long-term control; was started in 12/15.  Recent history:  A1c is 6.5 as of 1/21 Fructosamine is 267, historically this has been better        Non-insulin hypoglycemic drugs the patient is taking are: Metformin 1 g 2x  a day, Trulicity 1.5 mg weekly, Amaryl 1 mg at bedtime.     Glucose patterns and problems identified:  She did not bring her meter for review and does not know how to give her readings from her meter also  She thinks her blood sugars are about the same as before  This is with continuing the same regimen including Trulicity  Now she is able to have coverage for the Trulicity and also has no side effects  She is walking about 20 minutes and also doing some exercise bike for 20 minutes several days a week  Her weight has fluctuated, partly related to her thyroid issue  Lab glucose was 167 fasting  No hypoglycemia at any time with Amaryl with lowest reading about 80    Side effects from medications  have been: none Compliance with the medical regimen: fair    Glucose monitoring:  done once or twice a day         Glucometer: One Touch ultra mini,    Blood Glucose readings  from recall  Fasting usually 130-150 and after meals about the same Before dinner 80-90  Self-care: The diet that the patient has been following is: tries to limit fat intake.      Meals: 3 meals per day. Breakfast is eggs, , sometimes cottage cheese   Lunch is a sandwich or soup.  Snacks: Cottage cheese and fruit, no sweet drinks juices           Dietician visit, most recent: never     CDE visit: 1/16            Weight history:    Wt Readings from Last 3 Encounters:  04/22/19 169 lb (76.7 kg)  02/11/19 164 lb (74.4 kg)  11/21/18 167 lb (75.8 kg)    Glycemic control:   Lab Results  Component Value Date   HGBA1C 6.5 06/06/2019   HGBA1C 6.5 11/19/2018   HGBA1C 5.9 07/18/2018   Lab Results  Component Value Date   MICROALBUR 1.3 06/06/2019   LDLCALC 115 (H) 04/22/2019   CREATININE 0.68 08/12/2019   Lab Results  Component Value Date   FRUCTOSAMINE 262 08/12/2019   FRUCTOSAMINE 247 06/02/2016   FRUCTOSAMINE 274 (  H) 07/16/2014    Past diabetes history:  For several years prior to her diagnosis she had been complaining of her feet burning Her blood sugars were mildly increased initially at diagnosis and she did try to control it with diet alone and exercise without getting them back to normal.  Her A1c had continue to be around 7% with the lowest one 6.7  About 2012 she was started on metformin probably when her A1c was 7.3.   In 2015 her blood sugars had been higher with A1c 8.1%  HYPERLIPIDEMIA: Discussed in review of systems    Allergies as of 08/15/2019      Reactions   Crestor [rosuvastatin]    REACTION: N/V and heartburn   Lipitor [atorvastatin]    REACTION: Hot flashes and flu-like symptoms   Pravastatin Sodium    REACTION: Neck swelling and pain in her shoulders and arms.    Sulfonamide Derivatives    Rxn Unknown   Zetia [ezetimibe]    GI upset   Zocor [simvastatin] Other (See Comments)   REACTION: GI.Marland Kitchen Pt unknown of severity   Niacin    REACTION: n/v      Medication List       Accurate as of August 14, 2019  9:29 PM. If you have any questions, ask your nurse or doctor.        amLODipine 10 MG tablet Commonly known as: NORVASC Take 0.5 tablets (5 mg total) by mouth daily.   aspirin EC 81 MG tablet Take 1 tablet (81 mg total) by mouth daily.   carvedilol 6.25 MG tablet Commonly known as: COREG TAKE 1 TABLET BY MOUTH TWICE A DAY   cholecalciferol 1000 units tablet Commonly known as: VITAMIN D Take 2,000 Units by mouth daily.   glimepiride 1 MG tablet Commonly known as: AMARYL TAKE 1 TABLET BY MOUTH EVERYDAY AT BEDTIME   glucose blood test strip Commonly known as: ONE TOUCH ULTRA TEST USE TO CHECK BLOOD SUGAR TWICE DAILY  Dx:E11.65   Jardiance 10 MG Tabs tablet Generic drug: empagliflozin Take 10 mg by mouth daily with breakfast.   Magnesium 250 MG Tabs Take 1 tablet by mouth 2 (two) times daily.   metFORMIN 1000 MG tablet Commonly known as: GLUCOPHAGE TAKE 1 TABLET BY MOUTH TWICE A DAY   nitroGLYCERIN 0.4 MG SL tablet Commonly known as: NITROSTAT Place 1 tablet (0.4 mg total) under the tongue every 5 (five) minutes x 3 doses as needed for chest pain.   ramipril 10 MG capsule Commonly known as: ALTACE TAKE 1 CAPSULE BY MOUTH EVERY DAY   rosuvastatin 5 MG tablet Commonly known as: CRESTOR TAKE 1/2 TABLET BY MOUTH DAILY   Synthroid 125 MCG tablet Generic drug: levothyroxine TAKE 1 TABLET (125 MCG TOTAL) BY MOUTH DAILY BEFORE BREAKFAST.   Trulicity 1.5 0000000 Sopn Generic drug: Dulaglutide INJECT CONTENTS OF PEN WEEKLY       Allergies:  Allergies  Allergen Reactions  . Crestor [Rosuvastatin]     REACTION: N/V and heartburn  . Lipitor [Atorvastatin]     REACTION: Hot flashes and flu-like symptoms  . Pravastatin  Sodium     REACTION: Neck swelling and pain in her shoulders and arms.  . Sulfonamide Derivatives     Rxn Unknown  . Zetia [Ezetimibe]     GI upset  . Zocor [Simvastatin] Other (See Comments)    REACTION: GI.Marland Kitchen Pt unknown of severity  . Niacin     REACTION: n/v    Past Medical History:  Diagnosis Date  . Abnormal liver function tests   . Adenomatous colon polyp   . Aortic stenosis, moderate 05/09/2018  . CAD (coronary artery disease) 05/09/2018   a. LHC 05/09/18: DES to mid/dist LAD, mod AS  . Cerebrovascular disease    a. CT 04/2016 -calcific plaque at the origin LEFT vertebral contributing to severe stenosis.  . Diverticulitis of colon   . Fatty liver    a. mild fatty liver infiltration by CT 2014.  Marland Kitchen Foot injury 2008   Left foot/leg with ?? torn muscle -PROBLEM HAS RESOLVED  . Hand pain 05/2007   steroid injections--RESOLVED  . Heart murmur   . History of kidney stones   . Hx of renal calculi    LEFT  . Hyperlipidemia   . Hypertension   . Hypothyroidism    pt states hx of thyroid nodules  . IBS (irritable bowel syndrome)   . Internal hemorrhoids   . Melanoma ([place]) 1980s   mid upper stomach (05/08/2018)  . OSA on CPAP     SETTING IS 14 (05/08/2018)  . Statin intolerance   . Stroke [facility])    a. right thalamic infarct in 04/2016 felt by neuro to be due to small vessel disease.  . Type II diabetes mellitus ([place])     Past Surgical History:  Procedure Laterality Date  . CARDIAC CATHETERIZATION    . CATARACT EXTRACTION W/ INTRAOCULAR LENS  IMPLANT, BILATERAL Bilateral 08/2016  . COLONOSCOPY W/ BIOPSIES AND POLYPECTOMY  05/19/2005   adenomatous polyps  . CORONARY STENT INTERVENTION N/A 05/09/2018   Procedure: CORONARY STENT INTERVENTION;  Surgeon: Nelva Bush, MD;  Location: [place] [facility];  Service: Cardiovascular;  Laterality: N/A;  . LEFT HEART CATH AND CORONARY ANGIOGRAPHY N/A 05/09/2018   Procedure: LEFT HEART CATH AND CORONARY ANGIOGRAPHY;  Surgeon:  Nelva Bush, MD;  Location: [place] [facility];  Service: Cardiovascular;  Laterality: N/A;  . MELANOMA EXCISION  1980s   mid upper stomach  . NEPHROLITHOTOMY Left 01/27/2013   Procedure: NEPHROLITHOTOMY PERCUTANEOUS;  Surgeon: Dutch Gray, MD;  Location: [facility];  Service: Urology;  Laterality: Left;  . TUBAL LIGATION    . WISDOM TEETH EXTRACTED      Family History  Problem Relation Age of Onset  . Hyperlipidemia Mother   . Neurofibromatosis Father   . Thyroid disease Daughter   . Hyperlipidemia Sister   . Hyperlipidemia Brother   . Diabetes Neg Hx   . Stroke Neg Hx   . Colon cancer Neg Hx   . Esophageal cancer Neg Hx   . Pancreatic cancer Neg Hx   . Liver cancer Neg Hx     Social History:  reports that she has never smoked. She has never used smokeless tobacco. She reports that she does not drink alcohol or use drugs.     Review of Systems        LIPIDS: she has long-standing severe hypercholesterolemia and has been intolerant to all statin drugs with various reactions Lipitor caused flulike symptoms, abdominal bloating and hot flashes, pravastatin caused neck swelling and upper body pain, Zocor caused GI upset.  Was not able to tolerate Zetia as she thinks it upset his stomach She did not want to continue Repatha because of cost  LDL particle number previously over 2500 with ideal levels of below 1000  Is able to continue taking 2.5 mg Crestor every day and followed by cardiology PA, LDL as follows  Also taking small doses of OTC fish oil  Lab Results  Component Value Date   CHOL 188 04/22/2019   HDL 44 04/22/2019   LDLCALC 115 (H) 04/22/2019   LDLDIRECT 209 (H) 03/19/2018   TRIG 166 (H) 04/22/2019   CHOLHDL 4.3 04/22/2019               Thyroid:    She has had hypothyroidism for about 10 years and initially had a goiter also; highest TSH probably about 9.2 She prefers to take Brand name Synthroid and she thinks that levothyroxine does not work  She had  been taking 100 mcg of Synthroid prior to her last visit in 1/21 and this was lower than the recommended dose With this she was having more fatigue, sleepiness, low energy and cold intolerance along with weight gain Now she is on 125 mcg of Synthroid brand name With this her weight is coming down as well as she has less dry skin, cold intolerance and is able to get up on time in the morning and function better during the day   Her TSH is back to normal at 1.5, previously was increased at 22.6  Thyroid functions as below:  Lab Results  Component Value Date   TSH 1.54 08/12/2019   TSH 22.56 (H) 06/06/2019   TSH 2.94 11/19/2018   FREET4 1.08 08/12/2019   FREET4 0.66 06/06/2019   FREET4 0.85 07/18/2018   Lab Results  Component Value Date   TSH 1.54 08/12/2019   TSH 22.56 (H) 06/06/2019   TSH 2.94 11/19/2018   TSH 2.52 07/18/2018   TSH 1.908 05/08/2018   TSH 4.87 (H) 04/11/2018        The blood pressure is being treated with 5 mg amlodipine and 10 mg ramipril along with metoprolol This is also followed by cardiologist She tends to have high reading on the right arm  She does check blood pressure at home She thinks her blood pressure was high in December from taking an herbal weight loss medication which has been stopped  BP Readings from Last 3 Encounters:  04/22/19 (!) 156/90  02/11/19 106/70  11/21/18 (!) 150/82       LABS:  Lab on 08/12/2019  Component Date Value Ref Range Status  . Free T4 08/12/2019 1.08  0.60 - 1.60 ng/dL Final   Comment: Specimens from patients who are undergoing biotin therapy and /or ingesting biotin supplements may contain high levels of biotin.  The higher biotin concentration in these specimens interferes with this Free T4 assay.  Specimens that contain high levels  of biotin may cause false high results for this Free T4 assay.  Please interpret results in light of the total clinical presentation of the patient.    Marland Kitchen TSH 08/12/2019 1.54   0.35 - 4.50 uIU/mL Final  . Fructosamine 08/12/2019 262  0 - 285 umol/L Final   Comment: Published reference interval for apparently healthy subjects between age 53 and 20 is 1 - 285 umol/L and in a poorly controlled diabetic population is 228 - 563 umol/L with a mean of 396 umol/L.   Marland Kitchen Sodium 08/12/2019 138  135 - 145 mEq/L Final  . Potassium 08/12/2019 4.2  3.5 - 5.1 mEq/L Final  . Chloride 08/12/2019 99  96 - 112 mEq/L Final  . CO2 08/12/2019 28  19 - 32 mEq/L Final  . Glucose, Bld 08/12/2019 167* 70 - 99 mg/dL Final  . BUN 08/12/2019 12  6 - 23 mg/dL Final  . Creatinine, Ser 08/12/2019 0.68  0.40 - 1.20  mg/dL Final  . GFR 08/12/2019 86.33  >60.00 mL/min Final  . Calcium 08/12/2019 9.7  8.4 - 10.5 mg/dL Final    Physical Examination:  There were no vitals taken for this visit.           ASSESSMENT/PLAN:   Diabetes type 2 with BMI 28  See history of present illness for details of her current blood sugar patterns and management  A1c is last 6.5 and fructosamine 267 now  Blood clear if her blood sugars are any different than before since she did not have specific blood sugars available on her meter to relate to She was recommended Jardiance instead of Trulicity because of cost factors but she has continue Trulicity and currently is able to afford this Also Amaryl was increased to 1 mg in the last visit  With her thyroid improving she has felt better and is able to get back into exercise Usually does well with diet However still has difficulty losing enough weight  As before her fasting readings tend to be persistently high and lab glucose 167 in the morning   She will be given a trial of 3 mg Trulicity after she finishes her current dose  If her blood sugars start getting low below 80 she can reduce Amaryl to half dose  Will need A1c on the next visit  Discussed targets for blood sugars after meals  Also fasting blood sugars would be adequately controlled of at  least under 140 consistently  To call if blood sugars are consistently high, may consider adding Jardiance also for cardiovascular risk reduction   HYPOTHYROIDISM: Her TSH is back to normal with 125 mcg of Synthroid instead of 100 She is able to remember to take her Synthroid before breakfast on empty stomach consistently She will stay with brand-name Synthroid Follow-up in 4 months to make sure her levels are consistent  There are no Patient Instructions on file for this visit.    Elayne Snare 08/14/2019, 9:29 PM   Note: This office note was prepared with Dragon voice recognition system technology. Any transcriptional errors that result from this process are unintentional.

## 2019-08-15 ENCOUNTER — Encounter: Payer: Self-pay | Admitting: Endocrinology

## 2019-08-15 ENCOUNTER — Ambulatory Visit (INDEPENDENT_AMBULATORY_CARE_PROVIDER_SITE_OTHER): Payer: Medicare Other | Admitting: Endocrinology

## 2019-08-15 ENCOUNTER — Other Ambulatory Visit: Payer: Self-pay

## 2019-08-15 DIAGNOSIS — E1165 Type 2 diabetes mellitus with hyperglycemia: Secondary | ICD-10-CM | POA: Diagnosis not present

## 2019-08-15 DIAGNOSIS — E063 Autoimmune thyroiditis: Secondary | ICD-10-CM

## 2019-08-19 ENCOUNTER — Other Ambulatory Visit: Payer: Self-pay | Admitting: Endocrinology

## 2019-09-02 ENCOUNTER — Other Ambulatory Visit: Payer: Self-pay | Admitting: Endocrinology

## 2019-10-03 ENCOUNTER — Other Ambulatory Visit: Payer: Self-pay

## 2019-10-03 ENCOUNTER — Telehealth: Payer: Self-pay | Admitting: Endocrinology

## 2019-10-03 MED ORDER — TRULICITY 3 MG/0.5ML ~~LOC~~ SOAJ: 3.0000 mg | SUBCUTANEOUS | 1 refills | Status: DC

## 2019-10-03 NOTE — Telephone Encounter (Signed)
Rx sent 

## 2019-10-03 NOTE — Telephone Encounter (Signed)
Medication Refill Request  Did you call your pharmacy and request this refill first? Yes  . If patient has not contacted pharmacy first, instruct them to do so for future refills.  . Remind them that contacting the pharmacy for their refill is the quickest method to get the refill.  . Refill policy also stated that it will take anywhere between 24-72 hours to receive the refill.    Name of medication? trulicity - patient states the Dr increased from 1.5 but not sure what the exact increased dose was.  Is this a 90 day supply? yes  Name and location of pharmacy?  [id number] [address], [address]  [place], [place] [address]  Phone:  [phone] Fax:  [phone]

## 2019-11-26 ENCOUNTER — Other Ambulatory Visit: Payer: Self-pay | Admitting: Endocrinology

## 2019-12-26 ENCOUNTER — Other Ambulatory Visit: Payer: Self-pay | Admitting: Endocrinology

## 2019-12-29 ENCOUNTER — Other Ambulatory Visit: Payer: Self-pay

## 2019-12-29 ENCOUNTER — Other Ambulatory Visit (INDEPENDENT_AMBULATORY_CARE_PROVIDER_SITE_OTHER): Payer: Medicare Other

## 2019-12-29 ENCOUNTER — Other Ambulatory Visit: Payer: Self-pay | Admitting: Endocrinology

## 2019-12-29 DIAGNOSIS — E559 Vitamin D deficiency, unspecified: Secondary | ICD-10-CM | POA: Diagnosis not present

## 2019-12-29 DIAGNOSIS — E1165 Type 2 diabetes mellitus with hyperglycemia: Secondary | ICD-10-CM | POA: Diagnosis not present

## 2019-12-29 LAB — COMPREHENSIVE METABOLIC PANEL
ALT: 64 U/L — ABNORMAL HIGH (ref 0–35)
AST: 37 U/L (ref 0–37)
Albumin: 4.5 g/dL (ref 3.5–5.2)
Alkaline Phosphatase: 64 U/L (ref 39–117)
BUN: 15 mg/dL (ref 6–23)
CO2: 28 mEq/L (ref 19–32)
Calcium: 9.8 mg/dL (ref 8.4–10.5)
Chloride: 100 mEq/L (ref 96–112)
Creatinine, Ser: 0.65 mg/dL (ref 0.40–1.20)
GFR: 90.84 mL/min (ref >60.00)
Glucose, Bld: 131 mg/dL — ABNORMAL HIGH (ref 70–99)
Potassium: 4.1 mEq/L (ref 3.5–5.1)
Sodium: 138 mEq/L (ref 135–145)
Total Bilirubin: 0.6 mg/dL (ref 0.2–1.2)
Total Protein: 7.6 g/dL (ref 6.0–8.3)

## 2019-12-29 LAB — TSH: TSH: 2.52 u[IU]/mL (ref 0.35–4.50)

## 2019-12-29 LAB — VITAMIN D 25 HYDROXY (VIT D DEFICIENCY, FRACTURES): VITD: 81.04 ng/mL (ref 30.00–100.00)

## 2019-12-29 LAB — T4, FREE: Free T4: 1.1 ng/dL (ref 0.60–1.60)

## 2019-12-29 LAB — HEMOGLOBIN A1C: Hgb A1c MFr Bld: 6.9 % — ABNORMAL HIGH (ref 4.6–6.5)

## 2019-12-31 ENCOUNTER — Other Ambulatory Visit: Payer: Self-pay

## 2019-12-31 ENCOUNTER — Ambulatory Visit (INDEPENDENT_AMBULATORY_CARE_PROVIDER_SITE_OTHER): Payer: Medicare Other | Admitting: Endocrinology

## 2019-12-31 ENCOUNTER — Other Ambulatory Visit: Payer: Self-pay | Admitting: *Deleted

## 2019-12-31 ENCOUNTER — Encounter: Payer: Self-pay | Admitting: Endocrinology

## 2019-12-31 VITALS — BP 150/80 | HR 74 | Ht 64.0 in | Wt 167.2 lb

## 2019-12-31 DIAGNOSIS — E063 Autoimmune thyroiditis: Secondary | ICD-10-CM

## 2019-12-31 DIAGNOSIS — E1165 Type 2 diabetes mellitus with hyperglycemia: Secondary | ICD-10-CM | POA: Diagnosis not present

## 2019-12-31 DIAGNOSIS — E782 Mixed hyperlipidemia: Secondary | ICD-10-CM | POA: Diagnosis not present

## 2019-12-31 NOTE — Patient Instructions (Addendum)
Vitamin 5000 U daily  Take Crestor every 2 days

## 2019-12-31 NOTE — Progress Notes (Signed)
Patient ID: Joanna [name], female   DOB: Sep 20, 1950, 69 y.o.   MRN: [id number]           Reason for Appointment: Follow-up of endocrinology problems    History of Present Illness:          Diagnosis: Type 2 diabetes mellitus, date of diagnosis: 2010?       Prior history:  On her initial consultation she was advised to start Tanzeum in addition to metformin for multiple benefits including long-term control; was started in 12/15.  Recent history:  A1c is 6.9 Last visit was in 3/21        Non-insulin hypoglycemic drugs the patient is taking are: Metformin 1 g 2x  a day, Trulicity 1.5 mg weekly, Amaryl 1 mg at bedtime.     Glucose patterns and problems identified:  She has not taken her Trulicity regularly because of cost and none for the last 2 weeks  Not clear if her blood sugars are any better with increasing the dose to 3 mg  She says her One Touch prescriptions were not covered by insurance and she is using a [employer] meter  This is not programmed to the right time  She is walking about 20 minutes at least once a day and also periodically using bike for 20 minutes  Her weight has not gone up any further  She is usually trying to cut back on carbohydrates  Lab glucose was 131 fasting  No hypoglycemia at any time with Amaryl, recently lowest reading 94    Side effects from medications have been: none    Glucose monitoring:  done once or twice a day         Glucometer:  [employer] brand  Blood Glucose readings  from review of monitor   PRE-MEAL Fasting Lunch Dinner Bedtime Overall  Glucose range:  94-199  157, 206  137  162, 204   Mean/median:     ?     Self-care: The diet that the patient has been following is: tries to limit fat intake.      Meals: 3 meals per day. Breakfast is eggs, , sometimes cottage cheese   Lunch is a sandwich or soup.  Snacks: Cottage cheese and fruit, no sweet drinks juices           Dietician visit, most recent: never      CDE visit: 1/16             Weight history:    Wt Readings from Last 3 Encounters:  12/31/19 167 lb 3.2 oz (75.8 kg)  04/22/19 169 lb (76.7 kg)  02/11/19 164 lb (74.4 kg)    Glycemic control:   Lab Results  Component Value Date   HGBA1C 6.9 (H) 12/29/2019   HGBA1C 6.5 06/06/2019   HGBA1C 6.5 11/19/2018   Lab Results  Component Value Date   MICROALBUR 1.3 06/06/2019   LDLCALC 115 (H) 04/22/2019   CREATININE 0.65 12/29/2019   Lab Results  Component Value Date   FRUCTOSAMINE 262 08/12/2019   FRUCTOSAMINE 247 06/02/2016   FRUCTOSAMINE 274 (H) 07/16/2014    Past diabetes history:  For several years prior to her diagnosis she had been complaining of her feet burning Her blood sugars were mildly increased initially at diagnosis and she did try to control it with diet alone and exercise without getting them back to normal.  Her A1c had continue to be around 7% with the lowest one 6.7  About 2012 she was  started on metformin probably when her A1c was 7.3.   In 2015 her blood sugars had been higher with A1c 8.1%  HYPERLIPIDEMIA: Discussed in review of systems    Allergies as of 12/31/2019      Reactions   Crestor [rosuvastatin]    REACTION: N/V and heartburn   Lipitor [atorvastatin]    REACTION: Hot flashes and flu-like symptoms   Pravastatin Sodium    REACTION: Neck swelling and pain in her shoulders and arms.   Sulfonamide Derivatives    Rxn Unknown   Zetia [ezetimibe]    GI upset   Zocor [simvastatin] Other (See Comments)   REACTION: GI.Marland Kitchen Pt unknown of severity   Niacin    REACTION: n/v      Medication List       Accurate as of December 31, 2019  2:02 PM. If you have any questions, ask your nurse or doctor.        amLODipine 10 MG tablet Commonly known as: NORVASC Take 0.5 tablets (5 mg total) by mouth daily.   aspirin EC 81 MG tablet Take 1 tablet (81 mg total) by mouth daily.   carvedilol 6.25 MG tablet Commonly known as: COREG TAKE 1 TABLET BY MOUTH TWICE A DAY    cholecalciferol 1000 units tablet Commonly known as: VITAMIN D Take 5,000 Units by mouth daily.   glimepiride 1 MG tablet Commonly known as: AMARYL TAKE 1 TABLET BY MOUTH EVERYDAY AT BEDTIME   glucose blood test strip Commonly known as: ONE TOUCH ULTRA TEST USE TO CHECK BLOOD SUGAR TWICE DAILY  Dx:E11.65   Magnesium 250 MG Tabs Take 1 tablet by mouth 2 (two) times daily.   metFORMIN 1000 MG tablet Commonly known as: GLUCOPHAGE TAKE 1 TABLET BY MOUTH TWICE A DAY   nitroGLYCERIN 0.4 MG SL tablet Commonly known as: NITROSTAT Place 1 tablet (0.4 mg total) under the tongue every 5 (five) minutes x 3 doses as needed for chest pain.   ramipril 10 MG capsule Commonly known as: ALTACE TAKE 1 CAPSULE BY MOUTH EVERY DAY   rosuvastatin 5 MG tablet Commonly known as: CRESTOR TAKE 1/2 TABLET BY MOUTH DAILY   Synthroid 125 MCG tablet Generic drug: levothyroxine TAKE 1 TABLET (125 MCG TOTAL) BY MOUTH DAILY BEFORE BREAKFAST.   Trulicity 3 VO/3.5KK Sopn Generic drug: Dulaglutide INJECT 3 MG INTO THE SKIN EVERY 7 (SEVEN) DAYS       Allergies:  Allergies  Allergen Reactions  . Crestor [Rosuvastatin]     REACTION: N/V and heartburn  . Lipitor [Atorvastatin]     REACTION: Hot flashes and flu-like symptoms  . Pravastatin Sodium     REACTION: Neck swelling and pain in her shoulders and arms.  . Sulfonamide Derivatives     Rxn Unknown  . Zetia [Ezetimibe]     GI upset  . Zocor [Simvastatin] Other (See Comments)    REACTION: GI.Marland Kitchen Pt unknown of severity  . Niacin     REACTION: n/v    Past Medical History:  Diagnosis Date  . Abnormal liver function tests   . Adenomatous colon polyp   . Aortic stenosis, moderate 05/09/2018  . CAD (coronary artery disease) 05/09/2018   a. LHC 05/09/18: DES to mid/dist LAD, mod AS  . Cerebrovascular disease    a. CT 04/2016 -calcific plaque at the origin LEFT vertebral contributing to severe stenosis.  . Diverticulitis of colon   . Fatty  liver    a. mild fatty liver infiltration by CT 2014.  Marland Kitchen  Foot injury 2008   Left foot/leg with ?? torn muscle -PROBLEM HAS RESOLVED  . Hand pain 05/2007   steroid injections--RESOLVED  . Heart murmur   . History of kidney stones   . Hx of renal calculi    LEFT  . Hyperlipidemia   . Hypertension   . Hypothyroidism    pt states hx of thyroid nodules  . IBS (irritable bowel syndrome)   . Internal hemorrhoids   . Melanoma ([place]) 1980s   mid upper stomach (05/08/2018)  . OSA on CPAP     SETTING IS 14 (05/08/2018)  . Statin intolerance   . Stroke [facility])    a. right thalamic infarct in 04/2016 felt by neuro to be due to small vessel disease.  . Type II diabetes mellitus ([place])     Past Surgical History:  Procedure Laterality Date  . CARDIAC CATHETERIZATION    . CATARACT EXTRACTION W/ INTRAOCULAR LENS  IMPLANT, BILATERAL Bilateral 08/2016  . COLONOSCOPY W/ BIOPSIES AND POLYPECTOMY  05/19/2005   adenomatous polyps  . CORONARY STENT INTERVENTION N/A 05/09/2018   Procedure: CORONARY STENT INTERVENTION;  Surgeon: Nelva Bush, MD;  Location: [place] [facility];  Service: Cardiovascular;  Laterality: N/A;  . LEFT HEART CATH AND CORONARY ANGIOGRAPHY N/A 05/09/2018   Procedure: LEFT HEART CATH AND CORONARY ANGIOGRAPHY;  Surgeon: Nelva Bush, MD;  Location: [place] [facility];  Service: Cardiovascular;  Laterality: N/A;  . MELANOMA EXCISION  1980s   mid upper stomach  . NEPHROLITHOTOMY Left 01/27/2013   Procedure: NEPHROLITHOTOMY PERCUTANEOUS;  Surgeon: Dutch Gray, MD;  Location: [facility];  Service: Urology;  Laterality: Left;  . TUBAL LIGATION    . WISDOM TEETH EXTRACTED      Family History  Problem Relation Age of Onset  . Hyperlipidemia Mother   . Neurofibromatosis Father   . Thyroid disease Daughter   . Hyperlipidemia Sister   . Hyperlipidemia Brother   . Diabetes Neg Hx   . Stroke Neg Hx   . Colon cancer Neg Hx   . Esophageal cancer Neg Hx   . Pancreatic cancer Neg Hx    . Liver cancer Neg Hx     Social History:  reports that she has never smoked. She has never used smokeless tobacco. She reports that she does not drink alcohol and does not use drugs.     Review of Systems        LIPIDS: she has long-standing severe hypercholesterolemia and has been intolerant to all statin drugs with various reactions Lipitor caused flulike symptoms, abdominal bloating and hot flashes, pravastatin caused neck swelling and upper body pain, Zocor caused GI upset.  Was not able to tolerate Zetia as she thinks it upset his stomach She did not want to continue Repatha because of cost  LDL particle number previously over 2500 with ideal levels of below 1000  She is followed by cardiology PA, last visit in 12/20  She now says that she can only take the 1/3 tablet of Crestor every 3 days because of indigestion with this She thinks she was told to reduce the dose because of very low LDL previously  LDL as follows  Also taking small doses of OTC fish oil      Lab Results  Component Value Date   CHOL 188 04/22/2019   CHOL 186 02/11/2019   CHOL 120 06/10/2018   Lab Results  Component Value Date   HDL 44 04/22/2019   HDL 41 02/11/2019   HDL 34 (  L) 06/10/2018   Lab Results  Component Value Date   LDLCALC 115 (H) 04/22/2019   LDLCALC 113 (H) 02/11/2019   LDLCALC 60 06/10/2018   Lab Results  Component Value Date   TRIG 166 (H) 04/22/2019   TRIG 180 (H) 02/11/2019   TRIG 128 06/10/2018   Lab Results  Component Value Date   CHOLHDL 4.3 04/22/2019   CHOLHDL 4.5 (H) 02/11/2019   CHOLHDL 3.5 06/10/2018   Lab Results  Component Value Date   LDLDIRECT 209 (H) 03/19/2018   LDLDIRECT 170.0 12/11/2016   LDLDIRECT 178.0 08/22/2016                Thyroid:    She has had hypothyroidism for about 10 years and initially had a goiter also; highest TSH probably about 9.2 She prefers to take Brand name Synthroid and she thinks that levothyroxine does not work  She  had been taking 100 mcg of Synthroid prior to her visit in 1/21 and this was lower than the recommended dose With this she was having more fatigue, sleepiness, low energy and cold intolerance along with weight gain Now she is on 125 mcg of Synthroid brand name She feels fairly good recently  Her TSH is consistently back to normal at 2.5   Thyroid functions as below:  Lab Results  Component Value Date   TSH 2.52 12/29/2019   TSH 1.54 08/12/2019   TSH 22.56 (H) 06/06/2019   FREET4 1.10 12/29/2019   FREET4 1.08 08/12/2019   FREET4 0.66 06/06/2019   Lab Results  Component Value Date   TSH 2.52 12/29/2019   TSH 1.54 08/12/2019   TSH 22.56 (H) 06/06/2019   TSH 2.94 11/19/2018   TSH 2.52 07/18/2018   TSH 1.908 05/08/2018        The blood pressure is being treated with 5 mg amlodipine and 10 mg ramipril along with metoprolol This is also followed by cardiologist She tends to have high reading on the right arm  She does check blood pressure at home She thinks blood pressure may be higher today because of rushing in  BP Readings from Last 3 Encounters:  12/31/19 (!) 150/80  04/22/19 (!) 156/90  02/11/19 106/70       LABS:  Lab on 12/29/2019  Component Date Value Ref Range Status  . Free T4 12/29/2019 1.10  0.60 - 1.60 ng/dL Final   Comment: Specimens from patients who are undergoing biotin therapy and /or ingesting biotin supplements may contain high levels of biotin.  The higher biotin concentration in these specimens interferes with this Free T4 assay.  Specimens that contain high levels  of biotin may cause false high results for this Free T4 assay.  Please interpret results in light of the total clinical presentation of the patient.    Marland Kitchen TSH 12/29/2019 2.52  0.35 - 4.50 uIU/mL Final  . Sodium 12/29/2019 138  135 - 145 mEq/L Final  . Potassium 12/29/2019 4.1  3.5 - 5.1 mEq/L Final  . Chloride 12/29/2019 100  96 - 112 mEq/L Final  . CO2 12/29/2019 28  19 - 32 mEq/L  Final  . Glucose, Bld 12/29/2019 131* 70 - 99 mg/dL Final  . BUN 12/29/2019 15  6 - 23 mg/dL Final  . Creatinine, Ser 12/29/2019 0.65  0.40 - 1.20 mg/dL Final  . Total Bilirubin 12/29/2019 0.6  0.2 - 1.2 mg/dL Final  . Alkaline Phosphatase 12/29/2019 64  39 - 117 U/L Final  . AST 12/29/2019 37  0 - 37 U/L Final  . ALT 12/29/2019 64* 0 - 35 U/L Final  . Total Protein 12/29/2019 7.6  6.0 - 8.3 g/dL Final  . Albumin 12/29/2019 4.5  3.5 - 5.2 g/dL Final  . GFR 12/29/2019 90.84  >60.00 mL/min Final  . Calcium 12/29/2019 9.8  8.4 - 10.5 mg/dL Final  . Hgb A1c MFr Bld 12/29/2019 6.9* 4.6 - 6.5 % Final   Glycemic Control Guidelines for People with Diabetes:Non Diabetic:  <6%Goal of Therapy: <7%Additional Action Suggested:  >8%   . VITD 12/29/2019 81.04  30.00 - 100.00 ng/mL Final    Physical Examination:  BP (!) 150/80 (BP Location: Left Arm, Patient Position: Sitting, Cuff Size: Large)   Pulse 74   Ht 5\' 4"  (1.626 m)   Wt 167 lb 3.2 oz (75.8 kg)   SpO2 95%   BMI 28.70 kg/m            ASSESSMENT/PLAN:   Diabetes type 2 with BMI 28  See history of present illness for details of her current blood sugar patterns and management  A1c is 6.9  A1c is likely slightly higher from her not being regular with her Trulicity because of cost She says she will not qualify for patient assistance for this She is trying to do well with her portion and carbohydrate control as well as regular walking for exercise  Difficult to assess her blood sugars since she is using a generic meter with the wrong date and time programmed  For now she will continue the same regimen but start using the One Touch Verio monitor to check the blood sugar She will call if she has consistently high readings at any given time Discussed when to check her blood sugar and blood sugar targets fasting and after meals   HYPOTHYROIDISM: Her TSH is again normal with 125 mcg of Synthroid brand name and she will  continue  LIPIDS: Currently followed by cardiology but she is under the impression that her levels are excellent but her LDL was 115 last Her only side effect with Crestor 5 mg dose is indigestion and discussed that she should take the medication at least every other day instead of every 3 days   History of vitamin D deficiency: She is taking 10,000 units a day on her own and advised her that since her level is about 80 she can reduce it to 5000 units  Patient Instructions  Vitamin 5000 U daily  Take Crestor every 2 days     Elayne Snare 12/31/2019, 2:02 PM   Note: This office note was prepared with Dragon voice recognition system technology. Any transcriptional errors that result from this process are unintentional.

## 2020-01-01 ENCOUNTER — Other Ambulatory Visit: Payer: Self-pay | Admitting: *Deleted

## 2020-01-01 MED ORDER — ONETOUCH VERIO VI STRP: ORAL_STRIP | 1 refills | Status: DC

## 2020-01-01 MED ORDER — ONETOUCH DELICA PLUS LANCET33G MISC: 0 refills | Status: DC

## 2020-02-09 ENCOUNTER — Telehealth: Payer: Self-pay | Admitting: Cardiology

## 2020-02-09 NOTE — Telephone Encounter (Signed)
New message:     Patient calling and would like to speak with some one about getting in sooner than December I offered something today , but she can not. Please call patient.

## 2020-02-09 NOTE — Telephone Encounter (Signed)
attempted to contact pt to schedule appt at work # listed.  She was gone for the day.  Left message on cell phone to c/b to discuss.  She is not due for f/u with Dr Marlou Porch until 12/1.

## 2020-02-11 ENCOUNTER — Telehealth: Payer: Self-pay | Admitting: *Deleted

## 2020-02-11 DIAGNOSIS — I350 Nonrheumatic aortic (valve) stenosis: Secondary | ICD-10-CM

## 2020-02-11 NOTE — Telephone Encounter (Signed)
Otilio Saber D  You 25 minutes ago (2:56 PM)  TP Go ahead and set-up appointment anytime after 1 any day of the week.   You  Otilio Saber D 1 hour ago (2:09 PM)   According to documentation in your chart, the cardiac medications you should be taking are: Amlodipine 5 mg daily,  ASA 81 mg daily,  Carvedilol 6.25 mg one tablet twice a day,  Ramipril 10 mg daily and rosuvastatin 5 mg 1/2 tablet daily. As for the numbness on and off in your left arm that sometimes goes to your feet, this would not be cardiac.  You would need to be evaluated by your primary care doctor or neurologist for numbness.  At the time of your cardiac cath completed in 04/2018 - you did have a stent to one vessel which was opened at 100%.  The only other plaque build up noted was "mild, non-obstructive"  no partially blocked arteries in the blood vessels around your heart.  Your cardiac cath did demonstrate a moderate amount of stenosis at your aortic valve.  You will be due for an echocardiogram to follow this up.  We can go ahead and get this scheduled and schedule your follow up appointment with Dr Marlou Porch after that.  This information was reviewed and these recommendations/orders are from Dr Marlou Porch.  If you are in agreement with this plan I will be glad to place the order for the echo and you will be called to be scheduled.  Thank you,  Pam, RN for Dr Lesli Albee, [place] Rams  You 3 hours ago (12:15 PM)  TP Yes, last week I was on vacation and was having a hard time climbing stairs. From my stroke I've had on and off  numbness in my left arm, but now it sometime goes to my feet, but it's on and off. I know they said I had other place that were partially blocked and want to make that's not my problem.  Plus, I need to know what dosage of meds, Dr. Marlou Porch had me on.    You  Otilio Saber D 3 hours ago (11:23 AM)   Good morning,  I did receive the message that you would like to see Dr Marlou Porch before December when  you are scheduled.  I tried to call you and left a message to call back.  You are due for your yearly follow up with Dr Marlou Porch in December.  Are you having any problems or concerns that you need to see him sooner?  Please contact me either through MyChart or by phone.  Thank you,  Pam, RN for Dr Marlou Porch   Order placed for echocardiogram.  Pt to be called and scheduled.

## 2020-02-11 NOTE — Telephone Encounter (Signed)
This RN was able to contact pt via MyChart.  Reviewed pt's concerns with Dr Marlou Porch who ordered for her to have a 2 D Echo before f/u with him to assess status is previously seen AS.  Pt is agreeable and aware she will be contacted to b scheduled.  See that note for further documentation.

## 2020-02-11 NOTE — Telephone Encounter (Signed)
Sent message to pt via MyChart to contact me regarding her request to be seen by Dr Marlou Porch before December.  Her 1 yr f/u is due in December however if she is having any concerns/problems she can be seen sooner.

## 2020-02-13 ENCOUNTER — Other Ambulatory Visit: Payer: Self-pay | Admitting: Endocrinology

## 2020-02-16 ENCOUNTER — Other Ambulatory Visit: Payer: Self-pay | Admitting: Endocrinology

## 2020-03-01 ENCOUNTER — Ambulatory Visit (HOSPITAL_COMMUNITY): Payer: Medicare Other | Attending: Cardiology

## 2020-03-01 ENCOUNTER — Other Ambulatory Visit: Payer: Self-pay

## 2020-03-01 DIAGNOSIS — I350 Nonrheumatic aortic (valve) stenosis: Secondary | ICD-10-CM | POA: Insufficient documentation

## 2020-03-01 LAB — ECHOCARDIOGRAM COMPLETE
AR max vel: 1.04 cm2
AV Area VTI: 1.13 cm2
AV Area mean vel: 0.97 cm2
AV Mean grad: 31.8 mmHg
AV Peak grad: 55.8 mmHg
Ao pk vel: 3.73 m/s
Area-P 1/2: 2.46 cm2
S' Lateral: 2.5 cm

## 2020-03-03 ENCOUNTER — Telehealth: Payer: Self-pay | Admitting: Cardiology

## 2020-03-03 NOTE — Telephone Encounter (Signed)
    Pt is returning Pam's call for her echo result. she made an appt with Dr. Marlou Porch. But she also wanted to ask him if he wants to see her sooner.

## 2020-03-03 NOTE — Telephone Encounter (Signed)
Pt is aware of results and has been scheduled for f/u with Dr Marlou Porch 10/19.

## 2020-03-09 ENCOUNTER — Ambulatory Visit (INDEPENDENT_AMBULATORY_CARE_PROVIDER_SITE_OTHER): Payer: Medicare Other | Admitting: Cardiology

## 2020-03-09 ENCOUNTER — Other Ambulatory Visit: Payer: Self-pay | Admitting: Endocrinology

## 2020-03-09 ENCOUNTER — Encounter: Payer: Self-pay | Admitting: Cardiology

## 2020-03-09 ENCOUNTER — Other Ambulatory Visit: Payer: Self-pay

## 2020-03-09 VITALS — BP 140/84 | HR 69 | Ht 64.0 in | Wt 167.0 lb

## 2020-03-09 DIAGNOSIS — I350 Nonrheumatic aortic (valve) stenosis: Secondary | ICD-10-CM

## 2020-03-09 DIAGNOSIS — I2510 Atherosclerotic heart disease of native coronary artery without angina pectoris: Secondary | ICD-10-CM

## 2020-03-09 NOTE — Progress Notes (Signed)
Cardiology Office Note:    Date:  03/09/2020   ID:  Joanna OBEIRNE, DOB 1951-02-28, MRN [id number]  PCP:  Patient, No Pcp Per  [facility] Cardiologist:  Candee Furbish, MD  [place] Electrophysiologist:  None   Referring MD: No ref. provider found     History of Present Illness:    Joanna [name] is a 69 y.o. female here for the follow-up of coronary artery disease prior LAD stent with moderate to severe aortic stenosis and prior stroke in 2018 with left arm periodic weakness.  Her most recent echocardiogram on 03/01/2020 showed a peak velocity of 3.9 m/s and a mean gradient of 36 mmHg.  Borderline severe aortic stenosis.  Normal pump function.  She has had some increased fatigue.  However, she was able to hike [place] without much difficulty.  No significant shortness of breath, no anginal symptoms.  Sometimes when using her left arm she will feel some tingling some weakness below the elbow region.  Her last vascular study was performed in 2019 and showed no evidence of subclavian stenosis.  Excellent distal pulses.  Seems to doing quite well with the medications.  Father replaced valve at 68    Past Medical History:  Diagnosis Date   Abnormal liver function tests    Adenomatous colon polyp    Aortic stenosis, moderate 05/09/2018   CAD (coronary artery disease) 05/09/2018   a. LHC 05/09/18: DES to mid/dist LAD, mod AS   Cerebrovascular disease    a. CT 04/2016 -calcific plaque at the origin LEFT vertebral contributing to severe stenosis.   Diverticulitis of colon    Fatty liver    a. mild fatty liver infiltration by CT 2014.   Foot injury 2008   Left foot/leg with ?? torn muscle -PROBLEM HAS RESOLVED   Hand pain 05/2007   steroid injections--RESOLVED   Heart murmur    History of kidney stones    Hx of renal calculi    LEFT   Hyperlipidemia    Hypertension    Hypothyroidism    pt states hx of thyroid nodules   IBS (irritable bowel  syndrome)    Internal hemorrhoids    Melanoma ([place]) 1980s   mid upper stomach (05/08/2018)   OSA on CPAP     SETTING IS 14 (05/08/2018)   Statin intolerance    Stroke ([place])    a. right thalamic infarct in 04/2016 felt by neuro to be due to small vessel disease.   Type II diabetes mellitus ([place])     Past Surgical History:  Procedure Laterality Date   CARDIAC CATHETERIZATION     CATARACT EXTRACTION W/ INTRAOCULAR LENS  IMPLANT, BILATERAL Bilateral 08/2016   COLONOSCOPY W/ BIOPSIES AND POLYPECTOMY  05/19/2005   adenomatous polyps   CORONARY STENT INTERVENTION N/A 05/09/2018   Procedure: CORONARY STENT INTERVENTION;  Surgeon: Nelva Bush, MD;  Location: [facility];  Service: Cardiovascular;  Laterality: N/A;   LEFT HEART CATH AND CORONARY ANGIOGRAPHY N/A 05/09/2018   Procedure: LEFT HEART CATH AND CORONARY ANGIOGRAPHY;  Surgeon: Nelva Bush, MD;  Location: [facility];  Service: Cardiovascular;  Laterality: N/A;   MELANOMA EXCISION  1980s   mid upper stomach   NEPHROLITHOTOMY Left 01/27/2013   Procedure: NEPHROLITHOTOMY PERCUTANEOUS;  Surgeon: Dutch Gray, MD;  Location: [facility];  Service: Urology;  Laterality: Left;   TUBAL LIGATION     WISDOM TEETH EXTRACTED      Current Medications: Current Meds  Medication Sig  amLODipine (NORVASC) 10 MG tablet Take 0.5 tablets (5 mg total) by mouth daily.   aspirin EC 81 MG tablet Take 1 tablet (81 mg total) by mouth daily.   Blood Glucose Monitoring Suppl (ONETOUCH VERIO) w/Device KIT by Does not apply route. Use to check blood sugars once daily.   carvedilol (COREG) 6.25 MG tablet TAKE 1 TABLET BY MOUTH TWICE A DAY   cholecalciferol (VITAMIN D) 1000 units tablet Take 5,000 Units by mouth daily.    glimepiride (AMARYL) 1 MG tablet TAKE 1 TABLET BY MOUTH EVERYDAY AT BEDTIME   glucose blood (ONE TOUCH ULTRA TEST) test strip USE TO CHECK BLOOD SUGAR TWICE DAILY  Dx:E11.65   glucose blood (ONETOUCH  VERIO) test strip Use Onetouch Verio test strips once daily to test blood sugars   glucose blood test strip 1 each by Other route as needed for other. OneTouch Verio test strips . Test blood sugars once daily   Lancets (ONETOUCH DELICA PLUS [id number]) MISC Use to test blood sugars once daily.   Lancets (ONETOUCH ULTRASOFT) lancets 1 each by Other route as needed for other. Use to check blood sugars once daily.   Magnesium 250 MG TABS Take 1 tablet by mouth 2 (two) times daily.   metFORMIN (GLUCOPHAGE) 1000 MG tablet TAKE 1 TABLET BY MOUTH TWICE A DAY   nitroGLYCERIN (NITROSTAT) 0.4 MG SL tablet Place 1 tablet (0.4 mg total) under the tongue every 5 (five) minutes x 3 doses as needed for chest pain.   OneTouch Delica Lancets 17B MISC by Does not apply route. Use to test blood sugars once daily   ramipril (ALTACE) 10 MG capsule TAKE 1 CAPSULE BY MOUTH EVERY DAY   rosuvastatin (CRESTOR) 5 MG tablet TAKE 1/2 TABLET BY MOUTH DAILY   SYNTHROID 125 MCG tablet TAKE 1 TABLET (125 MCG TOTAL) BY MOUTH DAILY BEFORE BREAKFAST.   TRULICITY 3 LT/9.0ZE SOPN INJECT 3 MG (O.5ML) INTO THE SKIN EVERY 7 (SEVEN) DAYS     Allergies:   Crestor [rosuvastatin], Lipitor [atorvastatin], Pravastatin sodium, Sulfonamide derivatives, Zetia [ezetimibe], Zocor [simvastatin], and Niacin   Social History   Socioeconomic History   Marital status: Married    Spouse name: Not on file   Number of children: 3   Years of education: 13   Highest education level: High school graduate  Occupational History   Occupation: Producer, television/film/video: [employer]    Comment: sits most of day   Tobacco Use   Smoking status: Never Smoker   Smokeless tobacco: Never Used  Scientific laboratory technician Use: Never used  Substance and Sexual Activity   Alcohol use: Never   Drug use: Never   Sexual activity: Not Currently  Other Topics Concern   Not on file  Social History Narrative   Married, one son to daughters. She  does office work. One caffeinated drink daily.   Updated as of 04/30/2013   Social Determinants of Health   Financial Resource Strain:    Difficulty of Paying Living Expenses: Not on file  Food Insecurity:    Worried About Charity fundraiser in the Last Year: Not on file   [employer] of Food in the Last Year: Not on file  Transportation Needs:    Lack of Transportation (Medical): Not on file   Lack of Transportation (Non-Medical): Not on file  Physical Activity:    Days of Exercise per Week: Not on file   Minutes of Exercise per Session: Not  on file  Stress:    Feeling of Stress : Not on file  Social Connections:    Frequency of Communication with Friends and Family: Not on file   Frequency of Social Gatherings with Friends and Family: Not on file   Attends Religious Services: Not on file   Active Member of Clubs or Organizations: Not on file   Attends Archivist Meetings: Not on file   Marital Status: Not on file     Family History: The patient's family history includes Hyperlipidemia in her brother, mother, and sister; Neurofibromatosis in her father; Thyroid disease in her daughter. There is no history of Diabetes, Stroke, Colon cancer, Esophageal cancer, Pancreatic cancer, or Liver cancer.  ROS:   Please see the history of present illness.     All other systems reviewed and are negative.  EKGs/Labs/Other Studies Reviewed:    The following studies were reviewed today: ECHO  EKG:  EKG is  ordered today.  The ekg ordered today demonstrates sinus rhythm 69 with nonspecific ST-T wave changes  Recent Labs: 12/29/2019: ALT 64; BUN 15; Creatinine, Ser 0.65; Potassium 4.1; Sodium 138; TSH 2.52  Recent Lipid Panel    Component Value Date/Time   CHOL 188 04/22/2019 1521   TRIG 166 (H) 04/22/2019 1521   HDL 44 04/22/2019 1521   CHOLHDL 4.3 04/22/2019 1521   CHOLHDL 5.4 05/09/2018 0512   VLDL 31 05/09/2018 0512   LDLCALC 115 (H) 04/22/2019 1521    LDLDIRECT 209 (H) 03/19/2018 1643   LDLDIRECT 170.0 12/11/2016 0943     Risk Assessment/Calculations:       Physical Exam:    VS:  BP 140/84    Pulse 69    Ht 5' 4"  (1.626 m)    Wt 167 lb (75.8 kg)    BMI 28.67 kg/m     Wt Readings from Last 3 Encounters:  03/09/20 167 lb (75.8 kg)  12/31/19 167 lb 3.2 oz (75.8 kg)  04/22/19 169 lb (76.7 kg)     GEN:  Well nourished, well developed in no acute distress HEENT: Normal NECK: No JVD; No carotid bruits LYMPHATICS: No lymphadenopathy CARDIAC: RRR, 3/6 SM, no rubs, gallops RESPIRATORY:  Clear to auscultation without rales, wheezing or rhonchi  ABDOMEN: Soft, non-tender, non-distended MUSCULOSKELETAL:  No edema; No deformity  SKIN: Warm and dry NEUROLOGIC:  Alert and oriented x 3 PSYCHIATRIC:  Normal affect   ASSESSMENT:    1. Aortic stenosis, moderate   2. Coronary artery disease involving native coronary artery of native heart without angina pectoris    PLAN:    In order of problems listed above:  Moderate to severe aortic stenosis -Indeterminate cusps on last echocardiogram.  Interestingly, her father had aortic valve replacement also in his 67s. -We will repeat echocardiogram in 6 months.  We discussed the possibility of referral to cardiothoracic surgery for further thoughts.  I think since she is borderline severe at this point, she does not need an urgent CT surgery appointment. -Had lengthy conversation today about pathophysiology, course of disease, need for surgery.  Hyperlipidemia -She is able to tolerate low-dose Crestor.  Had trouble with higher dosing secondary to myalgias.  Last LDL from outside labs was 115.  Hemoglobin A1c 6.9 TSH 2.5.  Continue with current medical management.   Medication Adjustments/Labs and Tests Ordered: Current medicines are reviewed at length with the patient today.  Concerns regarding medicines are outlined above.  Orders Placed This Encounter  Procedures   EKG 12-Lead  ECHOCARDIOGRAM COMPLETE   No orders of the defined types were placed in this encounter.   Patient Instructions  Medication Instructions:  The current medical regimen is effective;  continue present plan and medications.  *If you need a refill on your cardiac medications before your next appointment, please call your pharmacy*  Testing/Procedures: Your physician has requested that you have an echocardiogram in 6 months. Echocardiography is a painless test that uses sound waves to create images of your heart. It provides your doctor with information about the size and shape of your heart and how well your hearts chambers and valves are working. This procedure takes approximately one hour. There are no restrictions for this procedure.  Follow-Up: At [facility], you and your health needs are our priority.  As part of our continuing mission to provide you with exceptional heart care, we have created designated Provider Care Teams.  These Care Teams include your primary Cardiologist (physician) and Advanced Practice Providers (APPs -  Physician Assistants and Nurse Practitioners) who all work together to provide you with the care you need, when you need it.  We recommend signing up for the patient portal called "MyChart".  Sign up information is provided on this After Visit Summary.  MyChart is used to connect with patients for Virtual Visits (Telemedicine).  Patients are able to view lab/test results, encounter notes, upcoming appointments, etc.  Non-urgent messages can be sent to your provider as well.   To learn more about what you can do with MyChart, go to [email or web address].    Your next appointment:   6 month(s)  The format for your next appointment:   In Person  Provider:   Candee Furbish, MD   Thank you for choosing [facility]!!        Signed, Candee Furbish, MD  03/09/2020 5:39 PM    [id number]

## 2020-03-09 NOTE — Patient Instructions (Signed)
Medication Instructions:  The current medical regimen is effective;  continue present plan and medications.  *If you need a refill on your cardiac medications before your next appointment, please call your pharmacy*  Testing/Procedures: Your physician has requested that you have an echocardiogram in 6 months. Echocardiography is a painless test that uses sound waves to create images of your heart. It provides your doctor with information about the size and shape of your heart and how well your heart's chambers and valves are working. This procedure takes approximately one hour. There are no restrictions for this procedure.  Follow-Up: At [facility], you and your health needs are our priority.  As part of our continuing mission to provide you with exceptional heart care, we have created designated Provider Care Teams.  These Care Teams include your primary Cardiologist (physician) and Advanced Practice Providers (APPs -  Physician Assistants and Nurse Practitioners) who all work together to provide you with the care you need, when you need it.  We recommend signing up for the patient portal called "MyChart".  Sign up information is provided on this After Visit Summary.  MyChart is used to connect with patients for Virtual Visits (Telemedicine).  Patients are able to view lab/test results, encounter notes, upcoming appointments, etc.  Non-urgent messages can be sent to your provider as well.   To learn more about what you can do with MyChart, go to [email or web address].    Your next appointment:   6 month(s)  The format for your next appointment:   In Person  Provider:   Mark Skains, MD   Thank you for choosing [facility]!!     

## 2020-03-11 ENCOUNTER — Other Ambulatory Visit: Payer: Self-pay | Admitting: Cardiology

## 2020-04-20 ENCOUNTER — Other Ambulatory Visit: Payer: Self-pay

## 2020-04-20 ENCOUNTER — Other Ambulatory Visit: Payer: Self-pay | Admitting: Endocrinology

## 2020-04-20 MED ORDER — CARVEDILOL 6.25 MG PO TABS
6.2500 mg | ORAL_TABLET | Freq: Two times a day (BID) | ORAL | 3 refills | Status: DC
Start: 2020-04-20 — End: 2021-04-20

## 2020-05-06 DIAGNOSIS — H00011 Hordeolum externum right upper eyelid: Secondary | ICD-10-CM | POA: Diagnosis not present

## 2020-05-06 DIAGNOSIS — H43812 Vitreous degeneration, left eye: Secondary | ICD-10-CM | POA: Diagnosis not present

## 2020-05-19 ENCOUNTER — Other Ambulatory Visit: Payer: Self-pay | Admitting: *Deleted

## 2020-05-19 MED ORDER — LEVOTHYROXINE SODIUM 125 MCG PO TABS
125.0000 ug | ORAL_TABLET | Freq: Every day | ORAL | 1 refills | Status: DC
Start: 2020-05-19 — End: 2020-12-29

## 2020-06-04 ENCOUNTER — Other Ambulatory Visit: Payer: Self-pay | Admitting: Physician Assistant

## 2020-06-04 ENCOUNTER — Other Ambulatory Visit: Payer: Self-pay | Admitting: Cardiology

## 2020-06-04 ENCOUNTER — Other Ambulatory Visit: Payer: Self-pay | Admitting: Endocrinology

## 2020-06-04 NOTE — Telephone Encounter (Signed)
Last OV 12/31/19, please advise

## 2020-07-07 ENCOUNTER — Other Ambulatory Visit (INDEPENDENT_AMBULATORY_CARE_PROVIDER_SITE_OTHER): Payer: Medicare Other

## 2020-07-07 ENCOUNTER — Other Ambulatory Visit: Payer: Self-pay

## 2020-07-07 DIAGNOSIS — E1165 Type 2 diabetes mellitus with hyperglycemia: Secondary | ICD-10-CM

## 2020-07-07 DIAGNOSIS — Z85828 Personal history of other malignant neoplasm of skin: Secondary | ICD-10-CM | POA: Diagnosis not present

## 2020-07-07 DIAGNOSIS — L821 Other seborrheic keratosis: Secondary | ICD-10-CM | POA: Diagnosis not present

## 2020-07-07 DIAGNOSIS — E559 Vitamin D deficiency, unspecified: Secondary | ICD-10-CM | POA: Diagnosis not present

## 2020-07-07 DIAGNOSIS — D2372 Other benign neoplasm of skin of left lower limb, including hip: Secondary | ICD-10-CM | POA: Diagnosis not present

## 2020-07-07 DIAGNOSIS — L814 Other melanin hyperpigmentation: Secondary | ICD-10-CM | POA: Diagnosis not present

## 2020-07-07 DIAGNOSIS — Z85820 Personal history of malignant melanoma of skin: Secondary | ICD-10-CM | POA: Diagnosis not present

## 2020-07-07 DIAGNOSIS — E063 Autoimmune thyroiditis: Secondary | ICD-10-CM

## 2020-07-07 DIAGNOSIS — C44722 Squamous cell carcinoma of skin of right lower limb, including hip: Secondary | ICD-10-CM | POA: Diagnosis not present

## 2020-07-07 DIAGNOSIS — D225 Melanocytic nevi of trunk: Secondary | ICD-10-CM | POA: Diagnosis not present

## 2020-07-07 LAB — COMPREHENSIVE METABOLIC PANEL
ALT: 49 U/L — ABNORMAL HIGH (ref 0–35)
AST: 26 U/L (ref 0–37)
Albumin: 4.7 g/dL (ref 3.5–5.2)
Alkaline Phosphatase: 83 U/L (ref 39–117)
BUN: 18 mg/dL (ref 6–23)
CO2: 29 mEq/L (ref 19–32)
Calcium: 10.5 mg/dL (ref 8.4–10.5)
Chloride: 100 mEq/L (ref 96–112)
Creatinine, Ser: 0.64 mg/dL (ref 0.40–1.20)
GFR: 91.21 mL/min (ref >60.00)
Glucose, Bld: 119 mg/dL — ABNORMAL HIGH (ref 70–99)
Potassium: 4.4 mEq/L (ref 3.5–5.1)
Sodium: 139 mEq/L (ref 135–145)
Total Bilirubin: 0.7 mg/dL (ref 0.2–1.2)
Total Protein: 8.1 g/dL (ref 6.0–8.3)

## 2020-07-07 LAB — MICROALBUMIN / CREATININE URINE RATIO
Creatinine,U: 117.1 mg/dL
Microalb Creat Ratio: 7.4 mg/g (ref 0.0–30.0)
Microalb, Ur: 8.7 mg/dL — ABNORMAL HIGH (ref 0.0–1.9)

## 2020-07-07 LAB — HEMOGLOBIN A1C: Hgb A1c MFr Bld: 6.2 % (ref 4.6–6.5)

## 2020-07-07 LAB — T4, FREE: Free T4: 1.02 ng/dL (ref 0.60–1.60)

## 2020-07-07 LAB — TSH: TSH: 1.43 u[IU]/mL (ref 0.35–4.50)

## 2020-07-08 ENCOUNTER — Other Ambulatory Visit: Payer: Self-pay | Admitting: Endocrinology

## 2020-07-08 DIAGNOSIS — E559 Vitamin D deficiency, unspecified: Secondary | ICD-10-CM

## 2020-07-08 LAB — VITAMIN D 25 HYDROXY (VIT D DEFICIENCY, FRACTURES): VITD: 74.06 ng/mL (ref 30.00–100.00)

## 2020-07-15 ENCOUNTER — Ambulatory Visit (INDEPENDENT_AMBULATORY_CARE_PROVIDER_SITE_OTHER): Payer: Medicare Other | Admitting: Endocrinology

## 2020-07-15 ENCOUNTER — Encounter: Payer: Self-pay | Admitting: Endocrinology

## 2020-07-15 ENCOUNTER — Other Ambulatory Visit: Payer: Self-pay

## 2020-07-15 VITALS — BP 140/76 | HR 76 | Ht 64.0 in | Wt 165.0 lb

## 2020-07-15 DIAGNOSIS — E782 Mixed hyperlipidemia: Secondary | ICD-10-CM

## 2020-07-15 DIAGNOSIS — E559 Vitamin D deficiency, unspecified: Secondary | ICD-10-CM | POA: Diagnosis not present

## 2020-07-15 DIAGNOSIS — E063 Autoimmune thyroiditis: Secondary | ICD-10-CM | POA: Diagnosis not present

## 2020-07-15 DIAGNOSIS — E119 Type 2 diabetes mellitus without complications: Secondary | ICD-10-CM | POA: Diagnosis not present

## 2020-07-15 NOTE — Progress Notes (Signed)
Patient ID: Joanna [name], female   DOB: [birth year], 70 y.o.   MRN: [id number]           Reason for Appointment: Follow-up of endocrinology problems    History of Present Illness:          Diagnosis: Type 2 diabetes mellitus, date of diagnosis: 2010?       Prior history:  On her initial consultation she was advised to start Tanzeum in addition to metformin for multiple benefits including long-term control; was started in 12/15.  Recent history:  A1c is 6.2, was 6.9        Non-insulin hypoglycemic drugs the patient is taking are: Metformin 1 g 2x  a day, Trulicity 3 mg weekly, Amaryl 1 mg at bedtime.     Glucose patterns and problems identified:  She has not brought her meter for download today  After her last visit she was recommended increasing her Trulicity and is now taking 3 mg weekly with no side effects  She thinks she is also trying to do better with her carbohydrate restricted diet  She says she is trying to eat dinner by 6 PM but will have a snack like a slice of cheese at bedtime which may or may not affect her morning reading  She has however lost only 2 pounds  Sometimes her fasting readings were in the 120 range and higher if she has difficulty with her sleep  Otherwise she does not think her postprandial readings are high  She is walking about 20-30  minutes at least once a day as before  She does not think her glucose has been low    Side effects from medications have been: none    Glucose monitoring:  done once or twice a day         Glucometer:  [employer] brand  Blood Glucose readings  from recall   Am 125-140 Pc <165  Previously:  PRE-MEAL Fasting Lunch Dinner Bedtime Overall  Glucose range:  94-199  157, 206  137  162, 204   Mean/median:     ?     Self-care: The diet that the patient has been following is: tries to limit fat intake.      Meals: 3 meals per day. Breakfast is eggs, , sometimes cottage cheese   Lunch is a sandwich or soup.   Snacks: Cottage cheese and fruit, no sweet drinks juices           Dietician visit, most recent: never      CDE visit: 1/16            Weight history:    Wt Readings from Last 3 Encounters:  07/15/20 165 lb (74.8 kg)  03/09/20 167 lb (75.8 kg)  12/31/19 167 lb 3.2 oz (75.8 kg)    Glycemic control:   Lab Results  Component Value Date   HGBA1C 6.2 07/07/2020   HGBA1C 6.9 (H) 12/29/2019   HGBA1C 6.5 06/06/2019   Lab Results  Component Value Date   MICROALBUR 8.7 (H) 07/07/2020   LDLCALC 115 (H) 04/22/2019   CREATININE 0.64 07/07/2020   Lab Results  Component Value Date   FRUCTOSAMINE 262 08/12/2019   FRUCTOSAMINE 247 06/02/2016   FRUCTOSAMINE 274 (H) 07/16/2014    Past diabetes history:  For several years prior to her diagnosis she had been complaining of her feet burning Her blood sugars were mildly increased initially at diagnosis and she did try to control it with diet alone and  exercise without getting them back to normal.  Her A1c had continue to be around 7% with the lowest one 6.7  About 2012 she was started on metformin probably when her A1c was 7.3.   In 2015 her blood sugars had been higher with A1c 8.1%  HYPERLIPIDEMIA: Discussed in review of systems    Allergies as of 07/15/2020      Reactions   Crestor [rosuvastatin]    REACTION: N/V and heartburn   Lipitor [atorvastatin]    REACTION: Hot flashes and flu-like symptoms   Pravastatin Sodium    REACTION: Neck swelling and pain in her shoulders and arms.   Sulfonamide Derivatives    Rxn Unknown   Zetia [ezetimibe]    GI upset   Zocor [simvastatin] Other (See Comments)   REACTION: GI.Marland Kitchen Pt unknown of severity   Niacin    REACTION: n/v      Medication List       Accurate as of July 15, 2020  3:57 PM. If you have any questions, ask your nurse or doctor.        amLODipine 10 MG tablet Commonly known as: NORVASC TAKE 1/2 TABLET BY MOUTH DAILY   aspirin EC 81 MG tablet Take 1 tablet (81 mg  total) by mouth daily.   carvedilol 6.25 MG tablet Commonly known as: COREG Take 1 tablet (6.25 mg total) by mouth 2 (two) times daily.   cholecalciferol 1000 units tablet Commonly known as: VITAMIN D Take 5,000 Units by mouth daily.   glimepiride 1 MG tablet Commonly known as: AMARYL 1 tablet daily at bedtime, please make follow-up appointment   glucose blood test strip 1 each by Other route as needed for other. OneTouch Verio test strips . Test blood sugars once daily   glucose blood test strip Commonly known as: ONE TOUCH ULTRA TEST USE TO CHECK BLOOD SUGAR TWICE DAILY  Dx:E11.65   OneTouch Verio test strip Generic drug: glucose blood Use Onetouch Verio test strips once daily to test blood sugars   levothyroxine 125 MCG tablet Commonly known as: Synthroid Take 1 tablet (125 mcg total) by mouth daily before breakfast.   Magnesium 250 MG Tabs Take 1 tablet by mouth 2 (two) times daily.   metFORMIN 1000 MG tablet Commonly known as: GLUCOPHAGE TAKE 1 TABLET BY MOUTH TWICE A DAY   nitroGLYCERIN 0.4 MG SL tablet Commonly known as: NITROSTAT Place 1 tablet (0.4 mg total) under the tongue every 5 (five) minutes x 3 doses as needed for chest pain.   onetouch ultrasoft lancets 1 each by Other route as needed for other. Use to check blood sugars once daily.   OneTouch Delica Lancets 70Y Misc by Does not apply route. Use to test blood sugars once daily   OneTouch Delica Plus [id number] Misc Use to test blood sugars once daily.   OneTouch Verio w/Device Kit by Does not apply route. Use to check blood sugars once daily.   ramipril 10 MG capsule Commonly known as: ALTACE TAKE 1 CAPSULE BY MOUTH EVERY DAY   rosuvastatin 5 MG tablet Commonly known as: CRESTOR TAKE 1/2 TABLET BY MOUTH DAILY   Trulicity 3 YD/7.4JO Sopn Generic drug: Dulaglutide INJECT 3 MG (O.5ML) INTO THE SKIN EVERY 7 (SEVEN) DAYS       Allergies:  Allergies  Allergen Reactions  . Crestor  [Rosuvastatin]     REACTION: N/V and heartburn  . Lipitor [Atorvastatin]     REACTION: Hot flashes and flu-like symptoms  . Pravastatin Sodium  REACTION: Neck swelling and pain in her shoulders and arms.  . Sulfonamide Derivatives     Rxn Unknown  . Zetia [Ezetimibe]     GI upset  . Zocor [Simvastatin] Other (See Comments)    REACTION: GI.Marland Kitchen Pt unknown of severity  . Niacin     REACTION: n/v    Past Medical History:  Diagnosis Date  . Abnormal liver function tests   . Adenomatous colon polyp   . Aortic stenosis, moderate 05/09/2018  . CAD (coronary artery disease) 05/09/2018   a. LHC 05/09/18: DES to mid/dist LAD, mod AS  . Cerebrovascular disease    a. CT 04/2016 -calcific plaque at the origin LEFT vertebral contributing to severe stenosis.  . Diverticulitis of colon   . Fatty liver    a. mild fatty liver infiltration by CT 2014.  Marland Kitchen Foot injury 2008   Left foot/leg with ?? torn muscle -PROBLEM HAS RESOLVED  . Hand pain 05/2007   steroid injections--RESOLVED  . Heart murmur   . History of kidney stones   . Hx of renal calculi    LEFT  . Hyperlipidemia   . Hypertension   . Hypothyroidism    pt states hx of thyroid nodules  . IBS (irritable bowel syndrome)   . Internal hemorrhoids   . Melanoma ([place]) 1980s   mid upper stomach (05/08/2018)  . OSA on CPAP     SETTING IS 14 (05/08/2018)  . Statin intolerance   . Stroke [facility])    a. right thalamic infarct in 04/2016 felt by neuro to be due to small vessel disease.  . Type II diabetes mellitus ([place])     Past Surgical History:  Procedure Laterality Date  . CARDIAC CATHETERIZATION    . CATARACT EXTRACTION W/ INTRAOCULAR LENS  IMPLANT, BILATERAL Bilateral 08/2016  . COLONOSCOPY W/ BIOPSIES AND POLYPECTOMY  05/19/2005   adenomatous polyps  . CORONARY STENT INTERVENTION N/A 05/09/2018   Procedure: CORONARY STENT INTERVENTION;  Surgeon: Nelva Bush, MD;  Location: [place] [facility];  Service: Cardiovascular;   Laterality: N/A;  . LEFT HEART CATH AND CORONARY ANGIOGRAPHY N/A 05/09/2018   Procedure: LEFT HEART CATH AND CORONARY ANGIOGRAPHY;  Surgeon: Nelva Bush, MD;  Location: [place] [facility];  Service: Cardiovascular;  Laterality: N/A;  . MELANOMA EXCISION  1980s   mid upper stomach  . NEPHROLITHOTOMY Left 01/27/2013   Procedure: NEPHROLITHOTOMY PERCUTANEOUS;  Surgeon: Dutch Gray, MD;  Location: [facility];  Service: Urology;  Laterality: Left;  . TUBAL LIGATION    . WISDOM TEETH EXTRACTED      Family History  Problem Relation Age of Onset  . Hyperlipidemia Mother   . Neurofibromatosis Father   . Thyroid disease Daughter   . Hyperlipidemia Sister   . Hyperlipidemia Brother   . Diabetes Neg Hx   . Stroke Neg Hx   . Colon cancer Neg Hx   . Esophageal cancer Neg Hx   . Pancreatic cancer Neg Hx   . Liver cancer Neg Hx     Social History:  reports that she has never smoked. She has never used smokeless tobacco. She reports that she does not drink alcohol and does not use drugs.     Review of Systems        LIPIDS: she has long-standing severe hypercholesterolemia and has been intolerant to all statin drugs with various reactions Lipitor caused flulike symptoms, abdominal bloating and hot flashes, pravastatin caused neck swelling and upper body pain, Zocor caused GI upset.  Was not able to tolerate Zetia as she thinks it upset his stomach She did not want to continue Repatha because of cost  LDL particle number previously over 2500 with ideal levels of below 1000  She is followed by cardiology and has been told to take 1/2 tablet of Crestor daily She seems to be tolerating this However no lipid levels are available  LDL as follows  Also taking small doses of OTC fish oil      Lab Results  Component Value Date   CHOL 188 04/22/2019   CHOL 186 02/11/2019   CHOL 120 06/10/2018   Lab Results  Component Value Date   HDL 44 04/22/2019   HDL 41 02/11/2019   HDL 34 (L)  06/10/2018   Lab Results  Component Value Date   LDLCALC 115 (H) 04/22/2019   LDLCALC 113 (H) 02/11/2019   LDLCALC 60 06/10/2018   Lab Results  Component Value Date   TRIG 166 (H) 04/22/2019   TRIG 180 (H) 02/11/2019   TRIG 128 06/10/2018   Lab Results  Component Value Date   CHOLHDL 4.3 04/22/2019   CHOLHDL 4.5 (H) 02/11/2019   CHOLHDL 3.5 06/10/2018   Lab Results  Component Value Date   LDLDIRECT 209 (H) 03/19/2018   LDLDIRECT 170.0 12/11/2016   LDLDIRECT 178.0 08/22/2016                Thyroid:    She has had hypothyroidism for about 10 years and initially had a goiter also; highest TSH probably about 9.2 She prefers to take Brand name Synthroid and she thinks that levothyroxine does not work  She had been taking 100 mcg of Synthroid prior to her visit in 1/21 and this was lower than the recommended dose With this she was having more fatigue, sleepiness, low energy and cold intolerance along with weight gain Again she is on 125 mcg of Synthroid brand name No complaints of fatigue  Her TSH is stable in the normal range  Thyroid functions as below:  Lab Results  Component Value Date   TSH 1.43 07/07/2020   TSH 2.52 12/29/2019   TSH 1.54 08/12/2019   FREET4 1.02 07/07/2020   FREET4 1.10 12/29/2019   FREET4 1.08 08/12/2019   Lab Results  Component Value Date   TSH 1.43 07/07/2020   TSH 2.52 12/29/2019   TSH 1.54 08/12/2019   TSH 22.56 (H) 06/06/2019   TSH 2.94 11/19/2018   TSH 2.52 07/18/2018        The blood pressure is being treated with 5 mg amlodipine and 10 mg ramipril along with metoprolol This is also followed by cardiologist She tends to have high reading on the right arm  She does check blood pressure at home    BP Readings from Last 3 Encounters:  07/15/20 140/76  03/09/20 140/84  12/31/19 (!) 150/80     Vitamin D deficiency: She is still taking 5000 units vitamin D3 qd Her level is still relatively high at 74  She appears to have  persistently high ALT levels although better than the last time  Lab Results  Component Value Date   ALT 49 (H) 07/07/2020     LABS:  No visits with results within 1 Week(s) from this visit.  Latest known visit with results is:  Lab on 07/07/2020  Component Date Value Ref Range Status  . Microalb, Ur 07/07/2020 8.7* 0.0 - 1.9 mg/dL Final  . Creatinine,U 07/07/2020 117.1  mg/dL Final  . Microalb  Creat Ratio 07/07/2020 7.4  0.0 - 30.0 mg/g Final  . Free T4 07/07/2020 1.02  0.60 - 1.60 ng/dL Final   Comment: Specimens from patients who are undergoing biotin therapy and /or ingesting biotin supplements may contain high levels of biotin.  The higher biotin concentration in these specimens interferes with this Free T4 assay.  Specimens that contain high levels  of biotin may cause false high results for this Free T4 assay.  Please interpret results in light of the total clinical presentation of the patient.    Marland Kitchen TSH 07/07/2020 1.43  0.35 - 4.50 uIU/mL Final  . Sodium 07/07/2020 139  135 - 145 mEq/L Final  . Potassium 07/07/2020 4.4  3.5 - 5.1 mEq/L Final  . Chloride 07/07/2020 100  96 - 112 mEq/L Final  . CO2 07/07/2020 29  19 - 32 mEq/L Final  . Glucose, Bld 07/07/2020 119* 70 - 99 mg/dL Final  . BUN 07/07/2020 18  6 - 23 mg/dL Final  . Creatinine, Ser 07/07/2020 0.64  0.40 - 1.20 mg/dL Final  . Total Bilirubin 07/07/2020 0.7  0.2 - 1.2 mg/dL Final  . Alkaline Phosphatase 07/07/2020 83  39 - 117 U/L Final  . AST 07/07/2020 26  0 - 37 U/L Final  . ALT 07/07/2020 49* 0 - 35 U/L Final  . Total Protein 07/07/2020 8.1  6.0 - 8.3 g/dL Final  . Albumin 07/07/2020 4.7  3.5 - 5.2 g/dL Final  . GFR 07/07/2020 91.21  >60.00 mL/min Final   Calculated using the CKD-EPI Creatinine Equation (2021)  . Calcium 07/07/2020 10.5  8.4 - 10.5 mg/dL Final  . Hgb A1c MFr Bld 07/07/2020 6.2  4.6 - 6.5 % Final   Glycemic Control Guidelines for People with Diabetes:Non Diabetic:  <6%Goal of Therapy:  <7%Additional Action Suggested:  >8%   . VITD 07/08/2020 74.06  30.00 - 100.00 ng/mL Final    Physical Examination:  BP 140/76   Pulse 76   Ht 5' 4"  (1.626 m)   Wt 165 lb (74.8 kg)   SpO2 99%   BMI 28.32 kg/m        No pedal edema present  Diabetic Foot Exam - Simple   Simple Foot Form Diabetic Foot exam was performed with the following findings: Yes   Visual Inspection No deformities, no ulcerations, no other skin breakdown bilaterally: Yes Sensation Testing Intact to touch and monofilament testing bilaterally: Yes Pulse Check Posterior Tibialis and Dorsalis pulse intact bilaterally: Yes Comments         ASSESSMENT/PLAN:   Diabetes type 2 with BMI 28  See history of present illness for details of her current blood sugar patterns and management  A1c is 6.2 compared to 6.9  She is now able to afford Trulicity and with 3 mg dosage is having better blood sugar control and A1c is lower than usual However no hypoglycemia recently taking Amaryl and fasting readings are reportedly mildly increased still She did not bring her meter She is trying to do well with diet and regular walking program  No changes needed in her regimen  No diabetic complications, foot exam normal and microalbumin is normal also  HYPOTHYROIDISM: Her TSH is staying normal with 125 mcg of Synthroid brand name and she will continue  LIPIDS: Currently followed by cardiology but she needs to have labs checked again   History of vitamin D deficiency: She is taking 5,000 units a day and despite reducing her dose her level is still high normal  at 24 She will reduce it to every other day  High ALT is likely to be from fatty liver since it has improved with better diabetes control She will need to discuss this with her new PCP also   There are no Patient Instructions on file for this visit.    Elayne Snare 07/15/2020, 3:57 PM   Note: This office note was prepared with Dragon voice recognition  system technology. Any transcriptional errors that result from this process are unintentional.

## 2020-07-19 ENCOUNTER — Encounter (HOSPITAL_COMMUNITY): Payer: Self-pay | Admitting: Cardiology

## 2020-07-20 ENCOUNTER — Other Ambulatory Visit: Payer: Self-pay | Admitting: Endocrinology

## 2020-08-25 DIAGNOSIS — H524 Presbyopia: Secondary | ICD-10-CM | POA: Diagnosis not present

## 2020-08-25 DIAGNOSIS — H43813 Vitreous degeneration, bilateral: Secondary | ICD-10-CM | POA: Diagnosis not present

## 2020-08-25 DIAGNOSIS — E119 Type 2 diabetes mellitus without complications: Secondary | ICD-10-CM | POA: Diagnosis not present

## 2020-08-25 DIAGNOSIS — H04123 Dry eye syndrome of bilateral lacrimal glands: Secondary | ICD-10-CM | POA: Diagnosis not present

## 2020-09-04 ENCOUNTER — Other Ambulatory Visit: Payer: Self-pay | Admitting: Endocrinology

## 2020-09-07 ENCOUNTER — Ambulatory Visit (HOSPITAL_COMMUNITY): Payer: Medicare Other | Attending: Cardiovascular Disease

## 2020-09-07 ENCOUNTER — Other Ambulatory Visit: Payer: Self-pay

## 2020-09-07 DIAGNOSIS — I350 Nonrheumatic aortic (valve) stenosis: Secondary | ICD-10-CM | POA: Diagnosis not present

## 2020-09-07 DIAGNOSIS — I2510 Atherosclerotic heart disease of native coronary artery without angina pectoris: Secondary | ICD-10-CM | POA: Diagnosis not present

## 2020-09-07 LAB — ECHOCARDIOGRAM COMPLETE
AR max vel: 0.62 cm2
AV Area VTI: 0.63 cm2
AV Area mean vel: 0.67 cm2
AV Mean grad: 36.3 mmHg
AV Peak grad: 65.5 mmHg
Ao pk vel: 4.05 m/s
Area-P 1/2: 2.11 cm2
S' Lateral: 2.6 cm

## 2020-09-10 ENCOUNTER — Other Ambulatory Visit: Payer: Self-pay | Admitting: Endocrinology

## 2020-09-28 ENCOUNTER — Ambulatory Visit (INDEPENDENT_AMBULATORY_CARE_PROVIDER_SITE_OTHER): Payer: Medicare Other | Admitting: Cardiology

## 2020-09-28 ENCOUNTER — Other Ambulatory Visit: Payer: Self-pay

## 2020-09-28 ENCOUNTER — Encounter: Payer: Self-pay | Admitting: Cardiology

## 2020-09-28 VITALS — BP 120/78 | HR 71 | Ht 64.0 in | Wt 166.0 lb

## 2020-09-28 DIAGNOSIS — I350 Nonrheumatic aortic (valve) stenosis: Secondary | ICD-10-CM | POA: Diagnosis not present

## 2020-09-28 DIAGNOSIS — I2510 Atherosclerotic heart disease of native coronary artery without angina pectoris: Secondary | ICD-10-CM

## 2020-09-28 DIAGNOSIS — E119 Type 2 diabetes mellitus without complications: Secondary | ICD-10-CM | POA: Diagnosis not present

## 2020-09-28 DIAGNOSIS — I10 Essential (primary) hypertension: Secondary | ICD-10-CM

## 2020-09-28 DIAGNOSIS — E785 Hyperlipidemia, unspecified: Secondary | ICD-10-CM

## 2020-09-28 NOTE — Patient Instructions (Signed)
Medication Instructions:  The current medical regimen is effective;  continue present plan and medications.  *If you need a refill on your cardiac medications before your next appointment, please call your pharmacy*  Testing/Procedures: Your physician has requested that you have an echocardiogram (before your next appt with Dr Marlou Porch in 03/2021). Echocardiography is a painless test that uses sound waves to create images of your heart. It provides your doctor with information about the size and shape of your heart and how well your heart's chambers and valves are working. This procedure takes approximately one hour. There are no restrictions for this procedure.  Follow-Up: At [facility], you and your health needs are our priority.  As part of our continuing mission to provide you with exceptional heart care, we have created designated Provider Care Teams.  These Care Teams include your primary Cardiologist (physician) and Advanced Practice Providers (APPs -  Physician Assistants and Nurse Practitioners) who all work together to provide you with the care you need, when you need it.  We recommend signing up for the patient portal called "MyChart".  Sign up information is provided on this After Visit Summary.  MyChart is used to connect with patients for Virtual Visits (Telemedicine).  Patients are able to view lab/test results, encounter notes, upcoming appointments, etc.  Non-urgent messages can be sent to your provider as well.   To learn more about what you can do with MyChart, go to [email or web address].    Your next appointment:   6 month(s)  The format for your next appointment:   In Person  Provider:   Candee Furbish, MD   Thank you for choosing [facility]!!

## 2020-09-28 NOTE — Progress Notes (Signed)
Cardiology Office Note:    Date:  09/28/2020   ID:  Joanna [name], DOB 10-Jul-1950, MRN [id number]  PCP:  Tonia Ghent, MD   [facility] Providers Cardiologist:  Candee Furbish, MD     Referring MD: No ref. provider found     History of Present Illness:     Joanna [name] is a 70 y.o. female here for the follow-up of coronary artery disease prior LAD stent with moderate to severe aortic stenosis and prior stroke in 2018 with left arm periodic weakness.  Father replaced valve at 69.    Denies any fevers chills nausea vomiting syncope bleeding.  Past Medical History:  Diagnosis Date  . Abnormal liver function tests   . Adenomatous colon polyp   . Aortic stenosis, moderate 05/09/2018  . CAD (coronary artery disease) 05/09/2018   a. LHC 05/09/18: DES to mid/dist LAD, mod AS  . Cerebrovascular disease    a. CT 04/2016 -calcific plaque at the origin LEFT vertebral contributing to severe stenosis.  . Diverticulitis of colon   . Fatty liver    a. mild fatty liver infiltration by CT 2014.  Marland Kitchen Foot injury 2008   Left foot/leg with ?? torn muscle -PROBLEM HAS RESOLVED  . Hand pain 05/2007   steroid injections--RESOLVED  . Heart murmur   . History of kidney stones   . Hx of renal calculi    LEFT  . Hyperlipidemia   . Hypertension   . Hypothyroidism    pt states hx of thyroid nodules  . IBS (irritable bowel syndrome)   . Internal hemorrhoids   . Melanoma ([place]) 1980s   mid upper stomach (05/08/2018)  . OSA on CPAP     SETTING IS 14 (05/08/2018)  . Statin intolerance   . Stroke [facility])    a. right thalamic infarct in 04/2016 felt by neuro to be due to small vessel disease.  . Type II diabetes mellitus ([place])     Past Surgical History:  Procedure Laterality Date  . CARDIAC CATHETERIZATION    . CATARACT EXTRACTION W/ INTRAOCULAR LENS  IMPLANT, BILATERAL Bilateral 08/2016  . COLONOSCOPY W/ BIOPSIES AND POLYPECTOMY  05/19/2005   adenomatous polyps  . CORONARY STENT  INTERVENTION N/A 05/09/2018   Procedure: CORONARY STENT INTERVENTION;  Surgeon: Nelva Bush, MD;  Location: [place] [facility];  Service: Cardiovascular;  Laterality: N/A;  . LEFT HEART CATH AND CORONARY ANGIOGRAPHY N/A 05/09/2018   Procedure: LEFT HEART CATH AND CORONARY ANGIOGRAPHY;  Surgeon: Nelva Bush, MD;  Location: [place] [facility];  Service: Cardiovascular;  Laterality: N/A;  . MELANOMA EXCISION  1980s   mid upper stomach  . NEPHROLITHOTOMY Left 01/27/2013   Procedure: NEPHROLITHOTOMY PERCUTANEOUS;  Surgeon: Dutch Gray, MD;  Location: [facility];  Service: Urology;  Laterality: Left;  . TUBAL LIGATION    . WISDOM TEETH EXTRACTED      Current Medications: Current Meds  Medication Sig  . amLODipine (NORVASC) 10 MG tablet TAKE 1/2 TABLET BY MOUTH DAILY  . aspirin EC 81 MG tablet Take 1 tablet (81 mg total) by mouth daily.  . Blood Glucose Monitoring Suppl (ONETOUCH VERIO) w/Device KIT by Does not apply route. Use to check blood sugars once daily.  . carvedilol (COREG) 6.25 MG tablet Take 1 tablet (6.25 mg total) by mouth 2 (two) times daily.  . cholecalciferol (VITAMIN D) 1000 units tablet Take 5,000 Units by mouth daily.   Marland Kitchen glimepiride (AMARYL) 1 MG tablet TAKE 1 TABLET BY MOUTH  EVERY DAY AT BEDTIME  . glucose blood (ONE TOUCH ULTRA TEST) test strip USE TO CHECK BLOOD SUGAR TWICE DAILY  Dx:E11.65  . glucose blood (ONETOUCH VERIO) test strip Use Onetouch Verio test strips once daily to test blood sugars  . glucose blood test strip 1 each by Other route as needed for other. OneTouch Verio test strips . Test blood sugars once daily  . Lancets (ONETOUCH DELICA PLUS LANCET33G) MISC Use to test blood sugars once daily.  . Lancets (ONETOUCH ULTRASOFT) lancets 1 each by Other route as needed for other. Use to check blood sugars once daily.  Marland Kitchen levothyroxine (SYNTHROID) 125 MCG tablet Take 1 tablet (125 mcg total) by mouth daily before breakfast.  . Magnesium 250 MG TABS Take 1 tablet  by mouth 2 (two) times daily.  . metFORMIN (GLUCOPHAGE) 1000 MG tablet TAKE 1 TABLET BY MOUTH TWICE A DAY  . nitroGLYCERIN (NITROSTAT) 0.4 MG SL tablet Place 1 tablet (0.4 mg total) under the tongue every 5 (five) minutes x 3 doses as needed for chest pain.  Letta Pate Delica Lancets 33G MISC by Does not apply route. Use to test blood sugars once daily  . ramipril (ALTACE) 10 MG capsule TAKE 1 CAPSULE BY MOUTH EVERY DAY  . rosuvastatin (CRESTOR) 5 MG tablet TAKE 1/2 TABLET BY MOUTH DAILY  . TRULICITY 3 MG/0.5ML SOPN INJECT 3 MG (O.5ML) INTO THE SKIN EVERY 7 (SEVEN) DAYS     Allergies:   Crestor [rosuvastatin], Lipitor [atorvastatin], Pravastatin sodium, Sulfonamide derivatives, Zetia [ezetimibe], Zocor [simvastatin], and Niacin   Social History   Socioeconomic History  . Marital status: Married    Spouse name: Not on file  . Number of children: 3  . Years of education: 32  . Highest education level: High school graduate  Occupational History  . Occupation: SECRETARY    Employer: [employer]    Comment: sits most of day   Tobacco Use  . Smoking status: Never Smoker  . Smokeless tobacco: Never Used  Vaping Use  . Vaping Use: Never used  Substance and Sexual Activity  . Alcohol use: Never  . Drug use: Never  . Sexual activity: Not Currently  Other Topics Concern  . Not on file  Social History Narrative   Married, one son to daughters. She does office work. One caffeinated drink daily.   Updated as of 04/30/2013   Social Determinants of Health   Financial Resource Strain: Not on file  Food Insecurity: Not on file  Transportation Needs: Not on file  Physical Activity: Not on file  Stress: Not on file  Social Connections: Not on file     Family History: The patient's family history includes Hyperlipidemia in her brother, mother, and sister; Neurofibromatosis in her father; Thyroid disease in her daughter. There is no history of Diabetes, Stroke, Colon cancer, Esophageal  cancer, Pancreatic cancer, or Liver cancer.  ROS:   Please see the history of present illness.     All other systems reviewed and are negative.  EKGs/Labs/Other Studies Reviewed:    The following studies were reviewed today:  ECHO 09/07/20:  1. Left ventricular ejection fraction, by estimation, is 65 to 70%. The  left ventricle has normal function. The left ventricle has no regional  wall motion abnormalities. Left ventricular diastolic parameters are  consistent with Grade I diastolic  dysfunction (impaired relaxation). Elevated left ventricular end-diastolic  pressure.  2. Right ventricular systolic function is normal. The right ventricular  size is normal.  3. The mitral valve is grossly normal. No evidence of mitral valve  regurgitation. No evidence of mitral stenosis. Moderate mitral annular  calcification.  4. The aortic valve is tricuspid. There is moderate calcification of the  aortic valve. There is moderate thickening of the aortic valve. Aortic  valve regurgitation is not visualized. Moderate to severe aortic valve  stenosis.     Recent Labs: 07/07/2020: ALT 49; BUN 18; Creatinine, Ser 0.64; Potassium 4.4; Sodium 139; TSH 1.43  Recent Lipid Panel    Component Value Date/Time   CHOL 188 04/22/2019 1521   TRIG 166 (H) 04/22/2019 1521   HDL 44 04/22/2019 1521   CHOLHDL 4.3 04/22/2019 1521   CHOLHDL 5.4 05/09/2018 0512   VLDL 31 05/09/2018 0512   LDLCALC 115 (H) 04/22/2019 1521   LDLDIRECT 209 (H) 03/19/2018 1643   LDLDIRECT 170.0 12/11/2016 0943     Risk Assessment/Calculations:      Physical Exam:    VS:  BP 120/78 (BP Location: Right Arm, Patient Position: Sitting, Cuff Size: Normal)   Pulse 71   Ht $R'5\' 4"'eW$  (1.626 m)   Wt 166 lb (75.3 kg)   SpO2 96%   BMI 28.49 kg/m     Wt Readings from Last 3 Encounters:  09/28/20 166 lb (75.3 kg)  07/15/20 165 lb (74.8 kg)  03/09/20 167 lb (75.8 kg)     GEN:  Well nourished, well developed in no acute  distress HEENT: Normal NECK: No JVD; No carotid bruits LYMPHATICS: No lymphadenopathy CARDIAC: RRR, 3/6 SM,no rubs, gallops RESPIRATORY:  Clear to auscultation without rales, wheezing or rhonchi  ABDOMEN: Soft, non-tender, non-distended MUSCULOSKELETAL:  No edema; No deformity  SKIN: Warm and dry NEUROLOGIC:  Alert and oriented x 3 PSYCHIATRIC:  Normal affect   ASSESSMENT:    1. Aortic stenosis, moderate   2. Coronary artery disease involving native coronary artery of native heart without angina pectoris   3. Hyperlipidemia LDL goal <70   4. Diabetes mellitus with coincident hypertension ([place])    PLAN:    In order of problems listed above:  Aortic stenosis -Study from 09/07/2020 moderate to severe aortic stenosis, mean gradient 39 mmHg. Peak velocity 3.95 m/s. Almost there.  Repeating echo in 6 months.  Some shortness of breath with house cleaning. No CP,  No syncope.  Mild advancement since prior study  Coronary artery disease - DES to mid to distal LAD on 05/09/2018.  Moderate aortic stenosis at that time.  Continue with goal-directed therapy.  Stroke - Right thalamic infarct in 2017, small vessel disease. Left palmer numbness, motor weakness in left arm at times.   Diabetes with hypertension hyperlipidemia - Medications reviewed as above. - Able to tolerate low-dose Crestor.  Higher doses caused myalgias.  71-month follow-up   Medication Adjustments/Labs and Tests Ordered: Current medicines are reviewed at length with the patient today.  Concerns regarding medicines are outlined above.  Orders Placed This Encounter  Procedures  . ECHOCARDIOGRAM COMPLETE   No orders of the defined types were placed in this encounter.   Patient Instructions  Medication Instructions:  The current medical regimen is effective;  continue present plan and medications.  *If you need a refill on your cardiac medications before your next appointment, please call your  pharmacy*  Testing/Procedures: Your physician has requested that you have an echocardiogram (before your next appt with Dr Marlou Porch in 03/2021). Echocardiography is a painless test that uses sound waves to create images of your heart. It provides your  doctor with information about the size and shape of your heart and how well your heart's chambers and valves are working. This procedure takes approximately one hour. There are no restrictions for this procedure.  Follow-Up: At [facility], you and your health needs are our priority.  As part of our continuing mission to provide you with exceptional heart care, we have created designated Provider Care Teams.  These Care Teams include your primary Cardiologist (physician) and Advanced Practice Providers (APPs -  Physician Assistants and Nurse Practitioners) who all work together to provide you with the care you need, when you need it.  We recommend signing up for the patient portal called "MyChart".  Sign up information is provided on this After Visit Summary.  MyChart is used to connect with patients for Virtual Visits (Telemedicine).  Patients are able to view lab/test results, encounter notes, upcoming appointments, etc.  Non-urgent messages can be sent to your provider as well.   To learn more about what you can do with MyChart, go to [email or web address].    Your next appointment:   6 month(s)  The format for your next appointment:   In Person  Provider:   Candee Furbish, MD   Thank you for choosing [facility]!!        Signed, Candee Furbish, MD  09/28/2020 9:06 AM    [place]

## 2020-10-07 ENCOUNTER — Other Ambulatory Visit: Payer: Self-pay | Admitting: Endocrinology

## 2020-10-27 DIAGNOSIS — H02831 Dermatochalasis of right upper eyelid: Secondary | ICD-10-CM | POA: Diagnosis not present

## 2020-10-27 DIAGNOSIS — H02421 Myogenic ptosis of right eyelid: Secondary | ICD-10-CM | POA: Diagnosis not present

## 2020-10-27 DIAGNOSIS — H02834 Dermatochalasis of left upper eyelid: Secondary | ICD-10-CM | POA: Diagnosis not present

## 2020-10-27 DIAGNOSIS — H02422 Myogenic ptosis of left eyelid: Secondary | ICD-10-CM | POA: Diagnosis not present

## 2020-10-27 DIAGNOSIS — H02423 Myogenic ptosis of bilateral eyelids: Secondary | ICD-10-CM | POA: Diagnosis not present

## 2020-10-27 DIAGNOSIS — H53483 Generalized contraction of visual field, bilateral: Secondary | ICD-10-CM | POA: Diagnosis not present

## 2020-10-27 DIAGNOSIS — D485 Neoplasm of uncertain behavior of skin: Secondary | ICD-10-CM | POA: Diagnosis not present

## 2020-12-29 ENCOUNTER — Other Ambulatory Visit: Payer: Self-pay | Admitting: Endocrinology

## 2021-01-10 ENCOUNTER — Other Ambulatory Visit: Payer: Medicare Other

## 2021-01-13 ENCOUNTER — Ambulatory Visit: Payer: Medicare Other | Admitting: Endocrinology

## 2021-02-10 ENCOUNTER — Other Ambulatory Visit (INDEPENDENT_AMBULATORY_CARE_PROVIDER_SITE_OTHER): Payer: Medicare Other

## 2021-02-10 ENCOUNTER — Other Ambulatory Visit: Payer: Medicare Other

## 2021-02-10 DIAGNOSIS — E559 Vitamin D deficiency, unspecified: Secondary | ICD-10-CM

## 2021-02-10 DIAGNOSIS — E119 Type 2 diabetes mellitus without complications: Secondary | ICD-10-CM

## 2021-02-10 DIAGNOSIS — E782 Mixed hyperlipidemia: Secondary | ICD-10-CM | POA: Diagnosis not present

## 2021-02-10 DIAGNOSIS — E063 Autoimmune thyroiditis: Secondary | ICD-10-CM

## 2021-02-10 LAB — COMPREHENSIVE METABOLIC PANEL
ALT: 39 U/L — ABNORMAL HIGH (ref 0–35)
AST: 24 U/L (ref 0–37)
Albumin: 4.6 g/dL (ref 3.5–5.2)
Alkaline Phosphatase: 81 U/L (ref 39–117)
BUN: 21 mg/dL (ref 6–23)
CO2: 30 mEq/L (ref 19–32)
Calcium: 10 mg/dL (ref 8.4–10.5)
Chloride: 101 mEq/L (ref 96–112)
Creatinine, Ser: 0.73 mg/dL (ref 0.40–1.20)
GFR: 84.52 mL/min (ref >60.00)
Glucose, Bld: 141 mg/dL — ABNORMAL HIGH (ref 70–99)
Potassium: 4.9 mEq/L (ref 3.5–5.1)
Sodium: 139 mEq/L (ref 135–145)
Total Bilirubin: 0.7 mg/dL (ref 0.2–1.2)
Total Protein: 7.9 g/dL (ref 6.0–8.3)

## 2021-02-10 LAB — LIPID PANEL
Cholesterol: 172 mg/dL (ref 0–200)
HDL: 42.1 mg/dL (ref >39.00)
LDL Cholesterol: 107 mg/dL — ABNORMAL HIGH (ref 0–99)
NonHDL: 129.85
Total CHOL/HDL Ratio: 4
Triglycerides: 115 mg/dL (ref 0.0–149.0)
VLDL: 23 mg/dL (ref 0.0–40.0)

## 2021-02-10 LAB — HEMOGLOBIN A1C: Hgb A1c MFr Bld: 6 % (ref 4.6–6.5)

## 2021-02-10 LAB — VITAMIN D 25 HYDROXY (VIT D DEFICIENCY, FRACTURES): VITD: 86.73 ng/mL (ref 30.00–100.00)

## 2021-02-10 LAB — T4, FREE: Free T4: 0.95 ng/dL (ref 0.60–1.60)

## 2021-02-10 LAB — TSH: TSH: 0.24 u[IU]/mL — ABNORMAL LOW (ref 0.35–5.50)

## 2021-02-15 ENCOUNTER — Ambulatory Visit (INDEPENDENT_AMBULATORY_CARE_PROVIDER_SITE_OTHER): Payer: Medicare Other | Admitting: Endocrinology

## 2021-02-15 ENCOUNTER — Other Ambulatory Visit: Payer: Self-pay

## 2021-02-15 VITALS — BP 130/82 | HR 86 | Ht 63.5 in | Wt 157.2 lb

## 2021-02-15 DIAGNOSIS — E559 Vitamin D deficiency, unspecified: Secondary | ICD-10-CM | POA: Diagnosis not present

## 2021-02-15 DIAGNOSIS — I2510 Atherosclerotic heart disease of native coronary artery without angina pectoris: Secondary | ICD-10-CM | POA: Diagnosis not present

## 2021-02-15 DIAGNOSIS — K760 Fatty (change of) liver, not elsewhere classified: Secondary | ICD-10-CM

## 2021-02-15 DIAGNOSIS — E063 Autoimmune thyroiditis: Secondary | ICD-10-CM | POA: Diagnosis not present

## 2021-02-15 DIAGNOSIS — E119 Type 2 diabetes mellitus without complications: Secondary | ICD-10-CM | POA: Diagnosis not present

## 2021-02-15 DIAGNOSIS — E782 Mixed hyperlipidemia: Secondary | ICD-10-CM | POA: Diagnosis not present

## 2021-02-15 MED ORDER — TRULICITY 1.5 MG/0.5ML ~~LOC~~ SOAJ: SUBCUTANEOUS | 0 refills | Status: DC

## 2021-02-15 NOTE — Patient Instructions (Addendum)
Take thyroid only 1/2 on Sundays and 1 on 6 days  Take Crestor at bedtime

## 2021-02-15 NOTE — Progress Notes (Signed)
Patient ID: Joanna [name], female   DOB: [birth year], 70 y.o.   MRN: [id number]           Reason for Appointment: Follow-up of endocrinology problems    History of Present Illness:          Diagnosis: Type 2 diabetes mellitus, date of diagnosis: 2010?       Prior history:  On her initial consultation she was advised to start Tanzeum in addition to metformin for multiple benefits including long-term control; was started in 12/15.  Recent history:  A1c is down to 6 compared to 6.2        Non-insulin hypoglycemic drugs the patient is taking are: Metformin 1 g 2x  a day, Trulicity 3 mg weekly, Amaryl 1 mg at bedtime.     Glucose patterns and problems identified: She has again not brought her home blood sugar records, using [facility] meter again She thinks her insurance does not cover test trips She is following a new diet for the last 6 weeks or so with very low carbohydrate intake especially during the daytime with a regular meal in the evening  She thinks with this she has lost 15 pounds  However her blood sugars in the mornings are still mildly increased, was 141 in the lab  Even with Amaryl she has not felt hypoglycemic however  She is concerned about the cost of Trulicity and wants to stop this  She thinks her blood sugars are usually not high after meals but not sure how often she does these She is walking about 20-30  minutes at least once a day as before     Side effects from medications have been: none    Glucose monitoring:  done once or twice a day         Glucometer:  [facility] brand  Blood Glucose readings  from recall  Am 128-135, PC under 130   Self-care: The diet that the patient has been following is: tries to limit fat intake.      Meals: 3 meals per day. Breakfast is eggs, , sometimes cottage cheese   Lunch is a sandwich or soup.  Snacks: Cottage cheese and fruit, no sweet drinks juices           Dietician visit, most recent: never      CDE visit: 1/16             Weight history:    Wt Readings from Last 3 Encounters:  02/15/21 157 lb 3.2 oz (71.3 kg)  09/28/20 166 lb (75.3 kg)  07/15/20 165 lb (74.8 kg)    Glycemic control:   Lab Results  Component Value Date   HGBA1C 6.0 02/10/2021   HGBA1C 6.2 07/07/2020   HGBA1C 6.9 (H) 12/29/2019   Lab Results  Component Value Date   MICROALBUR 8.7 (H) 07/07/2020   LDLCALC 107 (H) 02/10/2021   CREATININE 0.73 02/10/2021   Lab Results  Component Value Date   FRUCTOSAMINE 262 08/12/2019   FRUCTOSAMINE 247 06/02/2016   FRUCTOSAMINE 274 (H) 07/16/2014    Past diabetes history:  For several years prior to her diagnosis she had been complaining of her feet burning Her blood sugars were mildly increased initially at diagnosis and she did try to control it with diet alone and exercise without getting them back to normal.  Her A1c had continue to be around 7% with the lowest one 6.7  About 2012 she was started on metformin probably when her A1c was  7.3.   In 2015 her blood sugars had been higher with A1c 8.1%  HYPERLIPIDEMIA: Discussed in review of systems    Allergies as of 02/15/2021       Reactions   Crestor [rosuvastatin]    REACTION: N/V and heartburn   Lipitor [atorvastatin]    REACTION: Hot flashes and flu-like symptoms   Pravastatin Sodium    REACTION: Neck swelling and pain in her shoulders and arms.   Sulfonamide Derivatives    Rxn Unknown   Zetia [ezetimibe]    GI upset   Zocor [simvastatin] Other (See Comments)   REACTION: GI.Marland Kitchen Pt unknown of severity   Niacin    REACTION: n/v        Medication List        Accurate as of February 15, 2021  9:43 AM. If you have any questions, ask your nurse or doctor.          amLODipine 10 MG tablet Commonly known as: NORVASC TAKE 1/2 TABLET BY MOUTH DAILY   aspirin EC 81 MG tablet Take 1 tablet (81 mg total) by mouth daily.   carvedilol 6.25 MG tablet Commonly known as: COREG Take 1 tablet (6.25 mg total) by mouth 2  (two) times daily.   cholecalciferol 1000 units tablet Commonly known as: VITAMIN D Take 5,000 Units by mouth daily.   glimepiride 1 MG tablet Commonly known as: AMARYL TAKE 1 TABLET BY MOUTH EVERY DAY AT BEDTIME   glucose blood test strip 1 each by Other route as needed for other. OneTouch Verio test strips . Test blood sugars once daily   glucose blood test strip Commonly known as: ONE TOUCH ULTRA TEST USE TO CHECK BLOOD SUGAR TWICE DAILY  Dx:E11.65   OneTouch Verio test strip Generic drug: glucose blood Use Onetouch Verio test strips once daily to test blood sugars   Magnesium 250 MG Tabs Take 1 tablet by mouth 2 (two) times daily.   metFORMIN 1000 MG tablet Commonly known as: GLUCOPHAGE TAKE 1 TABLET BY MOUTH TWICE A DAY   nitroGLYCERIN 0.4 MG SL tablet Commonly known as: NITROSTAT Place 1 tablet (0.4 mg total) under the tongue every 5 (five) minutes x 3 doses as needed for chest pain.   onetouch ultrasoft lancets 1 each by Other route as needed for other. Use to check blood sugars once daily.   OneTouch Delica Lancets 64Q Misc by Does not apply route. Use to test blood sugars once daily   OneTouch Delica Plus [id number] Misc Use to test blood sugars once daily.   OneTouch Verio w/Device Kit by Does not apply route. Use to check blood sugars once daily.   ramipril 10 MG capsule Commonly known as: ALTACE TAKE 1 CAPSULE BY MOUTH EVERY DAY   rosuvastatin 5 MG tablet Commonly known as: CRESTOR TAKE 1/2 TABLET BY MOUTH DAILY   Synthroid 125 MCG tablet Generic drug: levothyroxine TAKE 1 TABLET BY MOUTH DAILY BEFORE BREAKFAST.   Trulicity 3 GL/8.7FI Sopn Generic drug: Dulaglutide INJECT 3 MG (O.5ML) INTO THE SKIN EVERY 7 (SEVEN) DAYS        Allergies:  Allergies  Allergen Reactions   Crestor [Rosuvastatin]     REACTION: N/V and heartburn   Lipitor [Atorvastatin]     REACTION: Hot flashes and flu-like symptoms   Pravastatin Sodium     REACTION:  Neck swelling and pain in her shoulders and arms.   Sulfonamide Derivatives     Rxn Unknown   Zetia [Ezetimibe]  GI upset   Zocor [Simvastatin] Other (See Comments)    REACTION: GI.Marland Kitchen Pt unknown of severity   Niacin     REACTION: n/v    Past Medical History:  Diagnosis Date   Abnormal liver function tests    Adenomatous colon polyp    Aortic stenosis, moderate 05/09/2018   CAD (coronary artery disease) 05/09/2018   a. LHC 05/09/18: DES to mid/dist LAD, mod AS   Cerebrovascular disease    a. CT 04/2016 -calcific plaque at the origin LEFT vertebral contributing to severe stenosis.   Diverticulitis of colon    Fatty liver    a. mild fatty liver infiltration by CT 2014.   Foot injury 2008   Left foot/leg with ?? torn muscle -PROBLEM HAS RESOLVED   Hand pain 05/2007   steroid injections--RESOLVED   Heart murmur    History of kidney stones    Hx of renal calculi    LEFT   Hyperlipidemia    Hypertension    Hypothyroidism    pt states hx of thyroid nodules   IBS (irritable bowel syndrome)    Internal hemorrhoids    Melanoma ([place]) 1980s   mid upper stomach (05/08/2018)   OSA on CPAP     SETTING IS 14 (05/08/2018)   Statin intolerance    Stroke ([place])    a. right thalamic infarct in 04/2016 felt by neuro to be due to small vessel disease.   Type II diabetes mellitus ([place])     Past Surgical History:  Procedure Laterality Date   CARDIAC CATHETERIZATION     CATARACT EXTRACTION W/ INTRAOCULAR LENS  IMPLANT, BILATERAL Bilateral 08/2016   COLONOSCOPY W/ BIOPSIES AND POLYPECTOMY  05/19/2005   adenomatous polyps   CORONARY STENT INTERVENTION N/A 05/09/2018   Procedure: CORONARY STENT INTERVENTION;  Surgeon: Nelva Bush, MD;  Location: [facility];  Service: Cardiovascular;  Laterality: N/A;   LEFT HEART CATH AND CORONARY ANGIOGRAPHY N/A 05/09/2018   Procedure: LEFT HEART CATH AND CORONARY ANGIOGRAPHY;  Surgeon: Nelva Bush, MD;  Location: [facility];   Service: Cardiovascular;  Laterality: N/A;   MELANOMA EXCISION  1980s   mid upper stomach   NEPHROLITHOTOMY Left 01/27/2013   Procedure: NEPHROLITHOTOMY PERCUTANEOUS;  Surgeon: Dutch Gray, MD;  Location: [facility];  Service: Urology;  Laterality: Left;   TUBAL LIGATION     WISDOM TEETH EXTRACTED      Family History  Problem Relation Age of Onset   Hyperlipidemia Mother    Neurofibromatosis Father    Thyroid disease Daughter    Hyperlipidemia Sister    Hyperlipidemia Brother    Diabetes Neg Hx    Stroke Neg Hx    Colon cancer Neg Hx    Esophageal cancer Neg Hx    Pancreatic cancer Neg Hx    Liver cancer Neg Hx     Social History:  reports that she has never smoked. She has never used smokeless tobacco. She reports that she does not drink alcohol and does not use drugs.     Review of Systems        LIPIDS: she has long-standing severe hypercholesterolemia and has been intolerant to all statin drugs with various reactions Lipitor caused flulike symptoms, abdominal bloating and hot flashes, pravastatin caused neck swelling and upper body pain, Zocor caused GI upset.  Was not able to tolerate Zetia as she thinks it upset his stomach She did not want to continue Repatha because of cost  LDL particle number previously  over 2500 with ideal levels of below 1000  She is followed by cardiology and has been told to take 1/2 tablet of Crestor qod She seems to be tolerating this but she thinks that she has GI side effects with taking it every day and takes it at bedtime  LDL is generally improving although still over 100  Also taking small doses of OTC fish oil      Lab Results  Component Value Date   CHOL 172 02/10/2021   CHOL 188 04/22/2019   CHOL 186 02/11/2019   Lab Results  Component Value Date   HDL 42.10 02/10/2021   HDL 44 04/22/2019   HDL 41 02/11/2019   Lab Results  Component Value Date   LDLCALC 107 (H) 02/10/2021   LDLCALC 115 (H) 04/22/2019   LDLCALC 113 (H)  02/11/2019   Lab Results  Component Value Date   TRIG 115.0 02/10/2021   TRIG 166 (H) 04/22/2019   TRIG 180 (H) 02/11/2019   Lab Results  Component Value Date   CHOLHDL 4 02/10/2021   CHOLHDL 4.3 04/22/2019   CHOLHDL 4.5 (H) 02/11/2019   Lab Results  Component Value Date   LDLDIRECT 209 (H) 03/19/2018   LDLDIRECT 170.0 12/11/2016   LDLDIRECT 178.0 08/22/2016                Thyroid:    She has had hypothyroidism for about 10 years and initially had a goiter also; highest TSH probably about 9.2 She prefers to take Brand name Synthroid and she thinks that levothyroxine does not work  She had been taking 100 mcg of Synthroid prior to her visit in 1/21 and this was lower than the recommended dose With this she was having more fatigue, sleepiness, low energy and cold intolerance along with weight gain Again she is on 125 mcg of Synthroid brand name No complaints of fatigue  Her TSH is stable in the normal range  Thyroid functions as below:  Lab Results  Component Value Date   TSH 0.24 (L) 02/10/2021   TSH 1.43 07/07/2020   TSH 2.52 12/29/2019   FREET4 0.95 02/10/2021   FREET4 1.02 07/07/2020   FREET4 1.10 12/29/2019   Lab Results  Component Value Date   TSH 0.24 (L) 02/10/2021   TSH 1.43 07/07/2020   TSH 2.52 12/29/2019   TSH 1.54 08/12/2019   TSH 22.56 (H) 06/06/2019   TSH 2.94 11/19/2018        The blood pressure is being treated with 5 mg amlodipine and 10 mg ramipril along with metoprolol This is also followed by cardiologist She tends to have high reading on the right arm  She does check blood pressure at home also    BP Readings from Last 3 Encounters:  02/15/21 130/82  09/28/20 120/78  07/15/20 140/76     Vitamin D deficiency: She is still taking 5000 units vitamin D3 qd Her level is still relatively high at 74  She appears to have persistently high liver function levels for the last few years This appears to be gradually improving  CT scan of  abdomen in 2014 showed fatty infiltration of the liver  Lab Results  Component Value Date   ALT 39 (H) 02/10/2021     LABS:  Appointment on 02/10/2021  Component Date Value Ref Range Status   VITD 02/10/2021 86.73  30.00 - 100.00 ng/mL Final   Free T4 02/10/2021 0.95  0.60 - 1.60 ng/dL Final   Comment: Specimens from patients  who are undergoing biotin therapy and /or ingesting biotin supplements may contain high levels of biotin.  The higher biotin concentration in these specimens interferes with this Free T4 assay.  Specimens that contain high levels  of biotin may cause false high results for this Free T4 assay.  Please interpret results in light of the total clinical presentation of the patient.     TSH 02/10/2021 0.24 (A) 0.35 - 5.50 uIU/mL Final   Cholesterol 02/10/2021 172  0 - 200 mg/dL Final   ATP III Classification       Desirable:  < 200 mg/dL               Borderline High:  200 - 239 mg/dL          High:  > = 240 mg/dL   Triglycerides 02/10/2021 115.0  0.0 - 149.0 mg/dL Final   Normal:  <150 mg/dLBorderline High:  150 - 199 mg/dL   HDL 02/10/2021 42.10  >39.00 mg/dL Final   VLDL 02/10/2021 23.0  0.0 - 40.0 mg/dL Final   LDL Cholesterol 02/10/2021 107 (A) 0 - 99 mg/dL Final   Total CHOL/HDL Ratio 02/10/2021 4   Final                  Men          Women1/2 Average Risk     3.4          3.3Average Risk          5.0          4.42X Average Risk          9.6          7.13X Average Risk          15.0          11.0                       NonHDL 02/10/2021 129.85   Final   NOTE:  Non-HDL goal should be 30 mg/dL higher than patient's LDL goal (i.e. LDL goal of < 70 mg/dL, would have non-HDL goal of < 100 mg/dL)   Sodium 02/10/2021 139  135 - 145 mEq/L Final   Potassium 02/10/2021 4.9  3.5 - 5.1 mEq/L Final   Chloride 02/10/2021 101  96 - 112 mEq/L Final   CO2 02/10/2021 30  19 - 32 mEq/L Final   Glucose, Bld 02/10/2021 141 (A) 70 - 99 mg/dL Final   BUN 02/10/2021 21  6 - 23 mg/dL  Final   Creatinine, Ser 02/10/2021 0.73  0.40 - 1.20 mg/dL Final   Total Bilirubin 02/10/2021 0.7  0.2 - 1.2 mg/dL Final   Alkaline Phosphatase 02/10/2021 81  39 - 117 U/L Final   AST 02/10/2021 24  0 - 37 U/L Final   ALT 02/10/2021 39 (A) 0 - 35 U/L Final   Total Protein 02/10/2021 7.9  6.0 - 8.3 g/dL Final   Albumin 02/10/2021 4.6  3.5 - 5.2 g/dL Final   GFR 02/10/2021 84.52  >60.00 mL/min Final   Calculated using the CKD-EPI Creatinine Equation (2021)   Calcium 02/10/2021 10.0  8.4 - 10.5 mg/dL Final   Hgb A1c MFr Bld 02/10/2021 6.0  4.6 - 6.5 % Final   Glycemic Control Guidelines for People with Diabetes:Non Diabetic:  <6%Goal of Therapy: <7%Additional Action Suggested:  >8%     Physical Examination:  BP 130/82   Pulse 86   Ht 5'  3.5" (1.613 m)   Wt 157 lb 3.2 oz (71.3 kg)   SpO2 99%   BMI 27.41 kg/m            ASSESSMENT/PLAN:   Diabetes type 2    See history of present illness for details of her current blood sugar patterns and management  Her A1c is stable at 6% compared to 6.2  She is not able to afford Trulicity because of being in the donut hole recently Previously had benefited from going up to the 3 mg dose, also taking metformin and Amaryl  She has lost significant amount of weight and she thinks her new diet that she is following is helping Also trying to be regular with her exercise Still tends to have high fasting readings  Recommendations: She can try going down to 1.5 mg Trulicity for 1 month and then 0.75 mg If she is able to consistently control her blood sugars with lower doses may consider stopping Trulicity for cost reasons She needs to keep a record of her blood sugars and bring them since she does not apparently get coverage for brand-name test strips   HYPOTHYROIDISM: Her TSH is low normal with 125 mcg of Synthroid brand which she takes regularly She will now take 6-1/2 tablets a week  LIPIDS: Currently followed by cardiology  Her LDL is  still over 100 and discussed need to get it down as low as possible She will try to take her Crestor at least 5 days a week if not every day   History of vitamin D deficiency: She is taking 5,000 units vitamin D3 with normal levels  High ALT is likely to be from fatty liver as this has been chronically increased  Previously had some fatty liver on CT scan of her abdomen This is improving with her weight loss   There are no Patient Instructions on file for this visit.    Elayne Snare 02/15/2021, 9:43 AM   Note: This office note was prepared with Dragon voice recognition system technology. Any transcriptional errors that result from this process are unintentional.

## 2021-03-06 ENCOUNTER — Other Ambulatory Visit: Payer: Self-pay | Admitting: Endocrinology

## 2021-03-24 ENCOUNTER — Ambulatory Visit (HOSPITAL_COMMUNITY): Payer: Medicare Other | Attending: Cardiovascular Disease

## 2021-03-24 ENCOUNTER — Other Ambulatory Visit: Payer: Self-pay

## 2021-03-24 DIAGNOSIS — I350 Nonrheumatic aortic (valve) stenosis: Secondary | ICD-10-CM

## 2021-03-24 LAB — ECHOCARDIOGRAM COMPLETE
AR max vel: 0.64 cm2
AV Area VTI: 0.58 cm2
AV Area mean vel: 0.58 cm2
AV Mean grad: 54 mmHg
AV Peak grad: 81.4 mmHg
Ao pk vel: 4.51 m/s
Area-P 1/2: 2.73 cm2
MV M vel: 4.48 m/s
MV Peak grad: 80.3 mmHg
P 1/2 time: 522 msec
S' Lateral: 2.8 cm

## 2021-04-05 ENCOUNTER — Telehealth: Payer: Self-pay

## 2021-04-05 NOTE — Telephone Encounter (Signed)
Called patient and discussed the PREVAIL trial. She wishes not to participate at this time due to upcoming surgery and to much going on in her life at the moment.

## 2021-04-06 ENCOUNTER — Other Ambulatory Visit: Payer: Self-pay

## 2021-04-06 ENCOUNTER — Ambulatory Visit (INDEPENDENT_AMBULATORY_CARE_PROVIDER_SITE_OTHER): Payer: Medicare Other | Admitting: Cardiology

## 2021-04-06 DIAGNOSIS — E119 Type 2 diabetes mellitus without complications: Secondary | ICD-10-CM | POA: Diagnosis not present

## 2021-04-06 DIAGNOSIS — Z8673 Personal history of transient ischemic attack (TIA), and cerebral infarction without residual deficits: Secondary | ICD-10-CM | POA: Diagnosis not present

## 2021-04-06 DIAGNOSIS — I350 Nonrheumatic aortic (valve) stenosis: Secondary | ICD-10-CM | POA: Diagnosis not present

## 2021-04-06 DIAGNOSIS — E785 Hyperlipidemia, unspecified: Secondary | ICD-10-CM | POA: Diagnosis not present

## 2021-04-06 DIAGNOSIS — I10 Essential (primary) hypertension: Secondary | ICD-10-CM | POA: Diagnosis not present

## 2021-04-06 DIAGNOSIS — I2510 Atherosclerotic heart disease of native coronary artery without angina pectoris: Secondary | ICD-10-CM

## 2021-04-06 NOTE — Patient Instructions (Signed)
Medication Instructions:   Your physician recommends that you continue on your current medications as directed. Please refer to the Current Medication list given to you today.  *If you need a refill on your cardiac medications before your next appointment, please call your pharmacy*    You have been referred to [place] OFFICE--YOU WILL GET A CALLBACK FROM OUR STRUCTURAL NURSE NAVIGATORS ([place]), TO ARRANGE THIS CONSULT APPOINTMENT    Follow-Up: At [facility], you and your health needs are our priority.  As part of our continuing mission to provide you with exceptional heart care, we have created designated Provider Care Teams.  These Care Teams include your primary Cardiologist (physician) and Advanced Practice Providers (APPs -  Physician Assistants and Nurse Practitioners) who all work together to provide you with the care you need, when you need it.  We recommend signing up for the patient portal called "MyChart".  Sign up information is provided on this After Visit Summary.  MyChart is used to connect with patients for Virtual Visits (Telemedicine).  Patients are able to view lab/test results, encounter notes, upcoming appointments, etc.  Non-urgent messages can be sent to your provider as well.   To learn more about what you can do with MyChart, go to [email or web address].    Your next appointment:   1 year(s)  The format for your next appointment:   In Person  Provider:   Candee Furbish, MD

## 2021-04-06 NOTE — Assessment & Plan Note (Addendum)
Aortic stenosis is now in the severe range with aortic valve V-max of 4.5 m/s and mean gradient of 54 mm of mercury.  Likely bicuspid valve.  Based upon this, we will go ahead and set her up with the structural heart clinic for aortic valve replacement.  Her husband was present for discussion.  Her daughter who is a Marine scientist now working at the [facility] hospital was also present via [employer].  Discussed potential for surgical valve replacement given her young age.  Also discussed that given her known coronary artery disease prior stent, she may need further angiogram.  She understands.

## 2021-04-06 NOTE — Assessment & Plan Note (Signed)
Small vessel disease.  Left palmar numbness.  Motor weakness in the left arms at times.  Continuing with goal-directed medical therapy.

## 2021-04-06 NOTE — Assessment & Plan Note (Signed)
Able to tolerate the low-dose Crestor only 5 mg.  Higher doses caused myalgias.  LDL goal less than 70.  Could consider PCSK9 inhibitor.

## 2021-04-06 NOTE — Assessment & Plan Note (Signed)
Prior drug-eluting stent in 2019 to the mid to distal LAD.  Overall stable without any anginal symptoms.

## 2021-04-06 NOTE — Progress Notes (Signed)
Cardiology Office Note:    Date:  04/06/2021   ID:  BLANCHE SCOVELL, DOB Jan 28, 1951, MRN [id number]  PCP:  Tonia Ghent, MD   [facility] Providers Cardiologist:  Candee Furbish, MD     Referring MD: Tonia Ghent, MD     History of Present Illness:     YENIFER SACCENTE is a 70 y.o. female here for the follow-up of coronary artery disease prior LAD stent (2019) with moderate to severe aortic stenosis and prior stroke in 2018 with left arm periodic weakness.  Used to enjoy [place] without much difficulty.  Father replaced valve at 26.    Today, she is accompanied by her husband and is doing okay. Her husband Facetimed their daughter so she could listen in on the appointment and help her parents remember the details discussed. Her daughter was a Marine scientist and is back in college for her Masters and now is the Secretary/administrator for [place].  Recently, her L arm has been feeling progressively tired and weak. This arm weakness started after her stroke incident in 2018. The weakness used to bother her only every 2 to 3 days. However, it has started bothering her constantly.  We discussed the progression of her aortic stenosis and 11/3 echo results. She is open to undergoing a valve replacement and is curious about the safety and recovery process. She is worried about bleeding because  of her 49m aspirin.  She used to walk her dog. However, activity can cause her to feel fatigued. She also noticed becoming more fatigued throughout the day.  She endorses sleep apnea and uses a CPAP machine. She has used a CPAP for at least 20 years.   She denies any palpitations, chest pain, or shortness of breath. No lightheadedness, headaches, syncope, orthopnea, PND, or lower extremity edema.  Past Medical History:  Diagnosis Date   Abnormal liver function tests    Adenomatous colon polyp    Aortic stenosis, moderate 05/09/2018   CAD (coronary artery disease) 05/09/2018   a. LHC 05/09/18:  DES to mid/dist LAD, mod AS   Cerebrovascular disease    a. CT 04/2016 -calcific plaque at the origin LEFT vertebral contributing to severe stenosis.   Diverticulitis of colon    Fatty liver    a. mild fatty liver infiltration by CT 2014.   Foot injury 2008   Left foot/leg with ?? torn muscle -PROBLEM HAS RESOLVED   Hand pain 05/2007   steroid injections--RESOLVED   Heart murmur    History of kidney stones    Hx of renal calculi    LEFT   Hyperlipidemia    Hypertension    Hypothyroidism    pt states hx of thyroid nodules   IBS (irritable bowel syndrome)    Internal hemorrhoids    [place]an[place] 1980[place]   mid upper stomach (05/08/2018)   OSA on CPAP     SETTING IS 14 (05/08/2018)   Statin intolerance    Stroke ([place]    a. right thalamic infarct in 04/2016 felt by neuro to be due to small vessel disease.   Type II diabetes mellitus ([place]     Past Surgical History:  Procedure Laterality Date   CARDIAC CATHETERIZATION     CATARACT EXTRACTION W/ INTRAOCULAR LENS  IMPLANT, BILATERAL Bilateral 08/2016   COLONOSCOPY W/ BIOPSIES AND POLYPECTOMY  05/19/2005   adenomatous polyps   CORONARY STENT INTERVENTION N/A 05/09/2018   Procedure: CORONARY STENT INTERVENTION;  Surgeon: ENelva Bush MD;  Location: [place] [facility];  Service: Cardiovascular;  Laterality: N/A;   LEFT HEART CATH AND CORONARY ANGIOGRAPHY N/A 05/09/2018   Procedure: LEFT HEART CATH AND CORONARY ANGIOGRAPHY;  Surgeon: Nelva Bush, MD;  Location: [place] [facility];  Service: Cardiovascular;  Laterality: N/A;   MELANOMA EXCISION  1980s   mid upper stomach   NEPHROLITHOTOMY Left 01/27/2013   Procedure: NEPHROLITHOTOMY PERCUTANEOUS;  Surgeon: Dutch Gray, MD;  Location: [facility];  Service: Urology;  Laterality: Left;   TUBAL LIGATION     WISDOM TEETH EXTRACTED      Current Medications: Current Meds  Medication Sig   amLODipine (NORVASC) 10 MG tablet TAKE 1/2 TABLET BY MOUTH DAILY   aspirin EC 81 MG tablet  Take 1 tablet (81 mg total) by mouth daily.   Blood Glucose Monitoring Suppl (ONETOUCH VERIO) w/Device KIT by Does not apply route. Use to check blood sugars once daily.   carvedilol (COREG) 6.25 MG tablet Take 1 tablet (6.25 mg total) by mouth 2 (two) times daily.   cholecalciferol (VITAMIN D) 1000 units tablet Take 5,000 Units by mouth daily.    Dulaglutide (TRULICITY) 1.5 JS/2.8BT SOPN Inject weekly   glimepiride (AMARYL) 1 MG tablet TAKE 1 TABLET BY MOUTH EVERYDAY AT BEDTIME   glucose blood (ONE TOUCH ULTRA TEST) test strip USE TO CHECK BLOOD SUGAR TWICE DAILY  Dx:E11.65   glucose blood (ONETOUCH VERIO) test strip Use Onetouch Verio test strips once daily to test blood sugars   glucose blood test strip 1 each by Other route as needed for other. OneTouch Verio test strips . Test blood sugars once daily   Lancets (ONETOUCH DELICA PLUS [id number]) MISC Use to test blood sugars once daily.   Lancets (ONETOUCH ULTRASOFT) lancets 1 each by Other route as needed for other. Use to check blood sugars once daily.   Magnesium 250 MG TABS Take 1 tablet by mouth 2 (two) times daily.   metFORMIN (GLUCOPHAGE) 1000 MG tablet TAKE 1 TABLET BY MOUTH TWICE A DAY   nitroGLYCERIN (NITROSTAT) 0.4 MG SL tablet Place 1 tablet (0.4 mg total) under the tongue every 5 (five) minutes x 3 doses as needed for chest pain.   OneTouch Delica Lancets 71G MISC by Does not apply route. Use to test blood sugars once daily   ramipril (ALTACE) 10 MG capsule TAKE 1 CAPSULE BY MOUTH EVERY DAY   rosuvastatin (CRESTOR) 5 MG tablet TAKE 1/2 TABLET BY MOUTH DAILY   SYNTHROID 125 MCG tablet TAKE 1 TABLET BY MOUTH DAILY BEFORE BREAKFAST.     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 She does office work. One caffeinated drink daily.   Updated as of 04/30/2013  Social Determinants of Health   Financial Resource Strain: Not on file  Food Insecurity: Not on file  Transportation Needs: Not on file  Physical Activity: Not on file  Stress: Not on file  Social Connections: Not on file     Family History: The patient's family history includes Hyperlipidemia in her brother, mother, and sister; Neurofibromatosis in her father; Thyroid disease in her daughter. There is no history of Diabetes, Stroke, Colon cancer, Esophageal cancer, Pancreatic cancer, or Liver cancer.  ROS:   Please see the history of present illness.    (+) L arm weakness (+) Fatigue  All other systems reviewed and are negative.  EKGs/Labs/Other Studies Reviewed:    The following studies were reviewed today: Echo 03/24/21  1. Left ventricular ejection fraction, by estimation, is 60 to 65%. The left ventricle has normal function. The left ventricle has no regional wall motion abnormalities. There is mild concentric left ventricular hypertrophy. Left ventricular diastolic parameters are consistent with Grade I diastolic dysfunction (impaired relaxation). Elevated left ventricular end-diastolic pressure. The average left ventricular global longitudinal strain is -19.8 %. The global longitudinal strain is normal.   2. Right ventricular systolic function is normal. The right ventricular size is normal. There is normal pulmonary artery systolic  pressure.   3. The mitral valve is normal in structure. Trivial mitral valve regurgitation. No evidence of mitral stenosis. Moderate mitral annular calcification.   4. The aortic valve is calcified. There is severe calcifcation of the aortic valve. There is severe thickening of the aortic valve. Aortic valve regurgitation is trivial. Severe aortic valve stenosis.   5. The inferior vena cava is normal in size with greater than 50% respiratory variability, suggesting right atrial pressure of 3 mmHg.  AV Vmax:           451.00 cm/s  AV Mean Grad:      54.0 mmHg   ECHO 09/07/20:  1. Left ventricular ejection fraction, by estimation, is 65 to 70%. The left ventricle has normal function. The left ventricle has no regional wall motion abnormalities. Left ventricular diastolic parameters are consistent with Grade I diastolic  dysfunction (impaired relaxation). Elevated left ventricular end-diastolic pressure.   2. Right ventricular systolic function is normal. The right ventricular size is normal.   3. The mitral valve is grossly normal. No evidence of mitral valve regurgitation. No evidence of mitral stenosis. Moderate mitral annular calcification.   4. The aortic valve is tricuspid. There is moderate calcification of the aortic valve. There is moderate thickening of the aortic valve. Aortic valve regurgitation is not visualized. Moderate to severe aortic valve stenosis.   Cardiac Cath 05/09/18 Conclusions: Severe single-vessel CAD with 90% mid/distal LAD stenosis.  There are additional mild to moderate stenoses involving the mid and distal LAD. Mild, non-obstructive OM and RCA disease. Low left ventricular filling pressure. Moderate aortic valve stenosis. Successful PCI to 90% mid/distal LAD stenosis using Orsiro 2.5 x 13 mm DES with 0% residual stenosis and TIMI-3 flow.  CT Head 05/08/18 1. No acute intracranial abnormality identified. 2. Mild chronic microvascular ischemic changes and mild volume  loss of the brain. Small chronic lacunar infarct in right thalamus.  CT Coronary FFR 05/03/18 1. CT FFR analysis showed significant stenosis in the mid LAD. A cardiac catheterization is recommended.  CT Coronary 05/03/18 1. Coronary calcium score of 257. This was 54 percentile for age and sex matched control. 2. Normal coronary origin with right dominance. 3. The study is affected by motion. There  is a diffuse CAD with 50-69% stenosis in the ostial RCA, 50-69% stenosis in the mid LAD and in a proximal portion of a small 1. diagonal artery. Additional analysis with CT FFR will be submitted. 4. Severe mitral annular calcifications  EKG: EKG was personally reviewed 04/06/21: Sinus rhythm, rate 60 bpm; Non-specific ST-T wave changes  Recent Labs: 02/10/2021: ALT 39; BUN 21; Creatinine, Ser 0.73; Potassium 4.9; Sodium 139; TSH 0.24  Recent Lipid Panel    Component Value Date/Time   CHOL 172 02/10/2021 0848   CHOL 188 04/22/2019 1521   TRIG 115.0 02/10/2021 0848   HDL 42.10 02/10/2021 0848   HDL 44 04/22/2019 1521   CHOLHDL 4 02/10/2021 0848   VLDL 23.0 02/10/2021 0848   LDLCALC 107 (H) 02/10/2021 0848   LDLCALC 115 (H) 04/22/2019 1521   LDLDIRECT 209 (H) 03/19/2018 1643   LDLDIRECT 170.0 12/11/2016 0943     Risk Assessment/Calculations:      Physical Exam:    VS:  BP 130/74 (BP Location: Left Arm, Patient Position: Sitting, Cuff Size: Normal)   Pulse 60   Ht 5' 3.5" (1.613 m)   Wt 154 lb (69.9 kg)   SpO2 97%   BMI 26.85 kg/m     Wt Readings from Last 3 Encounters:  04/06/21 154 lb (69.9 kg)  02/15/21 157 lb 3.2 oz (71.3 kg)  09/28/20 166 lb (75.3 kg)     GEN:  Well nourished, well developed in no acute distress HEENT: Normal NECK: No JVD; No carotid bruits LYMPHATICS: No lymphadenopathy CARDIAC: RRR, 4/6 SM, no rubs, gallops  RESPIRATORY:  Clear to auscultation without rales, wheezing or rhonchi  ABDOMEN: Soft, non-tender, non-distended MUSCULOSKELETAL:  No  edema; No deformity  SKIN: Warm and dry NEUROLOGIC:  Alert and oriented x 3 PSYCHIATRIC:  Normal affect   ASSESSMENT:    1. Nonrheumatic aortic valve stenosis   2. Coronary artery disease involving native coronary artery of native heart without angina pectoris   3. History of right thalamic stroke in 2017   4. Diabetes mellitus with coincident hypertension ([place])   5. Hyperlipidemia LDL goal <70     PLAN:    Aortic stenosis Aortic stenosis is now in the severe range with aortic valve V-max of 4.5 m/s and mean gradient of 54 mm of mercury.  Likely bicuspid valve.  Based upon this, we will go ahead and set her up with the structural heart clinic for aortic valve replacement.  Her husband was present for discussion.  Her daughter who is a Marine scientist now working at the [employer] hospital was also present via [employer].  Discussed potential for surgical valve replacement given her young age.  Also discussed that given her known coronary artery disease prior stent, she may need further angiogram.  She understands.  CAD (coronary artery disease) Prior drug-eluting stent in 2019 to the mid to distal LAD.  Overall stable without any anginal symptoms.  History of right thalamic stroke in 2017 Small vessel disease.  Left palmar numbness.  Motor weakness in the left arms at times.  Continuing with goal-directed medical therapy.  Diabetes mellitus with coincident hypertension ([place]) Medications reviewed such as Amaryl metformin.  On ramipril 10 mg daily for renal protection.  Blood pressure.  Also could not tolerate higher doses of Crestor.  Currently on Crestor 5 mg a day.  Had myalgias.  Hyperlipidemia LDL goal <70 Able to tolerate the low-dose Crestor only 5 mg.  Higher doses caused myalgias.  LDL goal less than  70.  Could consider PCSK9 inhibitor.   Medication Adjustments/Labs and Tests Ordered: Current medicines are reviewed at length with the patient today.  Concerns regarding medicines are outlined  above.  Orders Placed This Encounter  Procedures   Ambulatory referral to Structural Heart/Valve Clinic (only at [place])   EKG 12-Lead    No orders of the defined types were placed in this encounter.   Patient Instructions  Medication Instructions:   Your physician recommends that you continue on your current medications as directed. Please refer to the Current Medication list given to you today.  *If you need a refill on your cardiac medications before your next appointment, please call your pharmacy*    You have been referred to [place] OFFICE--YOU WILL GET A CALLBACK FROM OUR STRUCTURAL NURSE NAVIGATORS ([place]), TO ARRANGE THIS CONSULT APPOINTMENT    Follow-Up: At [facility], you and your health needs are our priority.  As part of our continuing mission to provide you with exceptional heart care, we have created designated Provider Care Teams.  These Care Teams include your primary Cardiologist (physician) and Advanced Practice Providers (APPs -  Physician Assistants and Nurse Practitioners) who all work together to provide you with the care you need, when you need it.  We recommend signing up for the patient portal called "MyChart".  Sign up information is provided on this After Visit Summary.  MyChart is used to connect with patients for Virtual Visits (Telemedicine).  Patients are able to view lab/test results, encounter notes, upcoming appointments, etc.  Non-urgent messages can be sent to your provider as well.   To learn more about what you can do with MyChart, go to [email or web address].    Your next appointment:   1 year(s)  The format for your next appointment:   In Person  Provider:   Candee Furbish, MD       Wilhemina Bonito as a scribe for Candee Furbish, MD.,have documented all relevant documentation on the behalf of Candee Furbish, MD,as directed by  Candee Furbish, MD while in the presence of Candee Furbish,  MD.  I, Candee Furbish, MD, have reviewed all documentation for this visit. The documentation on 04/06/21 for the exam, diagnosis, procedures, and orders are all accurate and complete.   Signed, Candee Furbish, MD  04/06/2021 5:29 PM    [facility]

## 2021-04-06 NOTE — Assessment & Plan Note (Signed)
Medications reviewed such as Amaryl metformin.  On ramipril 10 mg daily for renal protection.  Blood pressure.  Also could not tolerate higher doses of Crestor.  Currently on Crestor 5 mg a day.  Had myalgias.

## 2021-04-09 ENCOUNTER — Other Ambulatory Visit: Payer: Self-pay | Admitting: Cardiology

## 2021-04-20 ENCOUNTER — Other Ambulatory Visit: Payer: Self-pay | Admitting: Cardiology

## 2021-04-20 ENCOUNTER — Other Ambulatory Visit: Payer: Self-pay | Admitting: Endocrinology

## 2021-04-20 NOTE — Progress Notes (Signed)
° ° °Patient ID: °Joanna [name] °MRN: [id number] °DOB/AGE: 11/21/1950 70 y.o. ° °Primary Care Physician:Duncan, Graham S, MD °Primary Cardiologist: Skains, Mark, MD ° ° °PROBLEM LIST: °1.  Severe aortic stenosis with a mean gradient of 54 mmHg and a normal ejection fraction; EKG demonstrates normal sinus rhythm °2.  Coronary artery disease status post mid LAD stenting 2019 °3.  Hypertension °4.  Hyperlipidemia °5.  Type 2 Diabetes Mellitus °6.  History of stroke 2017 without residual deficits ° °HISTORY OF PRESENT ILLNESS: °The patient is a 70 y.o. year old female with the indicated medical issues referred by Dr. Skains for recommendations regarding the patient's aortic valvular disease.  The patient has noted increasing dyspnea and fatigue.  She fortunately has had no chest pain.  She denies any presyncope or syncope.  Complicating matters is the fact that she broke her ankle a couple of years ago and since that time she has been relatively sedentary.  She did go to [place] within the last year and stayed at a house with 4 sets of stairs and was moderately short of breath after ascending these.  She fortunately has not had any presyncope or syncope.  She denies any paroxysmal nocturnal dyspnea.  She has required no emergency room visits or hospitalizations. ° °Of note she did have a stroke presenting with left-sided weakness in 2017.  No significant carotid disease was seen.  She has no residual deficits from this. ° °Past Medical History:  °Diagnosis Date  ° Abnormal liver function tests   ° Adenomatous colon polyp   ° Aortic stenosis, moderate 05/09/2018  ° CAD (coronary artery disease) 05/09/2018  ° a. LHC 05/09/18: DES to mid/dist LAD, mod AS  ° Cerebrovascular disease   ° a. CT 04/2016 -calcific plaque at the origin LEFT vertebral contributing to severe stenosis.  ° Diverticulitis of colon   ° Fatty liver   ° a. mild fatty liver infiltration by CT 2014.  ° Foot injury 2008  ° Left foot/leg with  ?? torn muscle -PROBLEM HAS RESOLVED  ° Hand pain 05/2007  ° steroid injections--RESOLVED  ° Heart murmur   ° History of kidney stones   ° Hx of renal calculi   ° LEFT  ° Hyperlipidemia   ° Hypertension   ° Hypothyroidism   ° pt states hx of thyroid nodules  ° IBS (irritable bowel syndrome)   ° Internal hemorrhoids   ° Melanoma (HCC) 1980s  ° mid upper stomach (05/08/2018)  ° OSA on CPAP   °  SETTING IS 14 (05/08/2018)  ° Statin intolerance   ° Stroke (HCC)   ° a. right thalamic infarct in 04/2016 felt by neuro to be due to small vessel disease.  ° Type II diabetes mellitus (HCC)   °  °Past Surgical History:  °Procedure Laterality Date  ° CARDIAC CATHETERIZATION    ° CATARACT EXTRACTION W/ INTRAOCULAR LENS  IMPLANT, BILATERAL Bilateral 08/2016  ° COLONOSCOPY W/ BIOPSIES AND POLYPECTOMY  05/19/2005  ° adenomatous polyps  ° CORONARY STENT INTERVENTION N/A 05/09/2018  ° Procedure: CORONARY STENT INTERVENTION;  Surgeon: End, Christopher, MD;  Location: [facility];  Service: Cardiovascular;  Laterality: N/A;  ° LEFT HEART CATH AND CORONARY ANGIOGRAPHY N/A 05/09/2018  ° Procedure: LEFT HEART CATH AND CORONARY ANGIOGRAPHY;  Surgeon: End, Christopher, MD;  Location: [facility];  Service: Cardiovascular;  Laterality: N/A;  ° MELANOMA EXCISION  1980s  ° mid upper stomach  ° NEPHROLITHOTOMY Left 01/27/2013  °   Procedure: NEPHROLITHOTOMY PERCUTANEOUS;  Surgeon: Les Borden, MD;  Location: [facility];  Service: Urology;  Laterality: Left;  ° TUBAL LIGATION    ° WISDOM TEETH EXTRACTED    °  °Family History  °Problem Relation Age of Onset  ° Hyperlipidemia Mother   ° Neurofibromatosis Father   ° Thyroid disease Daughter   ° Hyperlipidemia Sister   ° Hyperlipidemia Brother   ° Diabetes Neg Hx   ° Stroke Neg Hx   ° Colon cancer Neg Hx   ° Esophageal cancer Neg Hx   ° Pancreatic cancer Neg Hx   ° Liver cancer Neg Hx   °  °Social History  ° °Socioeconomic History  ° Marital status: Married  °  Spouse name: Not on file  °  Number of children: 3  ° Years of education: 13  ° Highest education level: High school graduate  °Occupational History  ° Occupation: SECRETARY  °  Employer: [employer]  °  Comment: sits most of day   °Tobacco Use  ° Smoking status: Never  ° Smokeless tobacco: Never  °Vaping Use  ° Vaping Use: Never used  °Substance and Sexual Activity  ° Alcohol use: Never  ° Drug use: Never  ° Sexual activity: Not Currently  °Other Topics Concern  ° Not on file  °Social History Narrative  ° Married, one son to daughters. She does office work. One caffeinated drink daily.  ° Updated as of 04/30/2013  ° °Social Determinants of Health  ° °Financial Resource Strain: Not on file  °Food Insecurity: Not on file  °Transportation Needs: Not on file  °Physical Activity: Not on file  °Stress: Not on file  °Social Connections: Not on file  °Intimate Partner Violence: Not on file  °  ° °Prior to Admission medications   °Medication Sig Start Date End Date Taking? Authorizing Provider  °amLODipine (NORVASC) 10 MG tablet TAKE 1/2 TABLET BY MOUTH DAILY 06/04/20   Skains, Mark C, MD  °aspirin EC 81 MG tablet Take 1 tablet (81 mg total) by mouth daily. 05/11/16   Choi, Jennifer, DO  °Blood Glucose Monitoring Suppl (ONETOUCH VERIO) w/Device KIT by Does not apply route. Use to check blood sugars once daily.    [provider]  °carvedilol (COREG) 6.25 MG tablet Take 1 tablet (6.25 mg total) by mouth 2 (two) times daily. 04/20/20   Skains, Mark C, MD  °cholecalciferol (VITAMIN D) 1000 units tablet Take 5,000 Units by mouth daily.     [provider]  °Dulaglutide (TRULICITY) 1.5 MG/0.5ML SOPN Inject weekly 02/15/21   Kumar, Ajay, MD  °glimepiride (AMARYL) 1 MG tablet TAKE 1 TABLET BY MOUTH EVERYDAY AT BEDTIME 03/07/21   Kumar, Ajay, MD  °glucose blood (ONE TOUCH ULTRA TEST) test strip USE TO CHECK BLOOD SUGAR TWICE DAILY ° °Dx:E11.65 10/22/17   Kumar, Ajay, MD  °glucose blood (ONETOUCH VERIO) test strip Use Onetouch Verio test strips  once daily to test blood sugars 01/01/20   Kumar, Ajay, MD  °glucose blood test strip 1 each by Other route as needed for other. OneTouch Verio test strips . Test blood sugars once daily    [provider]  °Lancets (ONETOUCH DELICA PLUS LANCET33G) MISC Use to test blood sugars once daily. 01/01/20   Kumar, Ajay, MD  °Lancets (ONETOUCH ULTRASOFT) lancets 1 each by Other route as needed for other. Use to check blood sugars once daily.    [provider]  °Magnesium 250 MG TABS Take 1 tablet   by mouth 2 (two) times daily.    [provider]  °metFORMIN (GLUCOPHAGE) 1000 MG tablet TAKE 1 TABLET BY MOUTH TWICE A DAY 09/10/20   Kumar, Ajay, MD  °nitroGLYCERIN (NITROSTAT) 0.4 MG SL tablet Place 1 tablet (0.4 mg total) under the tongue every 5 (five) minutes x 3 doses as needed for chest pain. 05/10/18   Hammond, Janine, NP  °OneTouch Delica Lancets 33G MISC by Does not apply route. Use to test blood sugars once daily    [provider]  °ramipril (ALTACE) 10 MG capsule TAKE 1 CAPSULE BY MOUTH EVERY DAY 04/11/21   Skains, Mark C, MD  °rosuvastatin (CRESTOR) 5 MG tablet TAKE 1/2 TABLET BY MOUTH DAILY 06/04/20   Skains, Mark C, MD  °SYNTHROID 125 MCG tablet TAKE 1 TABLET BY MOUTH DAILY BEFORE BREAKFAST. 12/29/20   Kumar, Ajay, MD  ° ° °Allergies  °Allergen Reactions  ° Crestor [Rosuvastatin]   °  REACTION: N/V and heartburn  ° Lipitor [Atorvastatin]   °  REACTION: Hot flashes and flu-like symptoms  ° Pravastatin Sodium   °  REACTION: Neck swelling and pain in her shoulders and arms.  ° Sulfonamide Derivatives   °  Rxn Unknown  ° Zetia [Ezetimibe]   °  GI upset  ° Zocor [Simvastatin] Other (See Comments)  °  REACTION: GI.. Pt unknown of severity  ° Niacin   °  REACTION: n/v  ° ° °REVIEW OF SYSTEMS:  °General: no fevers/chills/night sweats °Eyes: no blurry vision, diplopia, or amaurosis °ENT: no sore throat or hearing loss °Resp: no cough, wheezing, or hemoptysis °CV: no edema or palpitations °GI:  no abdominal pain, nausea, vomiting, diarrhea, or constipation °GU: no dysuria, frequency, or hematuria °Skin: no rash °Neuro: no headache, numbness, tingling, or weakness of extremities °Musculoskeletal: no joint pain or swelling °Heme: no bleeding, DVT, or easy bruising °Endo: no polydipsia or polyuria ° °BP 138/72 (BP Location: Right Arm, Patient Position: Sitting, Cuff Size: Normal)    Pulse (!) 59    Ht 5' 3.5" (1.613 m)    Wt 153 lb 12.8 oz (69.8 kg)    SpO2 99%    BMI 26.82 kg/m²  ° °PHYSICAL EXAM: °GEN:  AO x 3 in no acute distress °HEENT: normal °Dentition: Normal °Neck: JVP normal. +2 carotid upstrokes without bruits. No thyromegaly. °Lungs: equal expansion, clear bilaterally °CV: Apex is discrete and nondisplaced, RRR with 4/6 SEM °Abd: soft, non-tender, non-distended; no bruit; positive bowel sounds °Ext: no edema, ecchymoses, or cyanosis °Vascular: 2+ femoral pulses, 2+ radial pulses       °Skin: warm and dry without rash °Neuro: CN II-XII grossly intact; motor and sensory grossly intact ° ° ° °DATA AND STUDIES: ° °EKG: Normal sinus rhythm with no conduction abnormalities ° °2D ECHO:  °11/22 °1. Left ventricular ejection fraction, by estimation, is 60 to 65%. The left ventricle has normal function. The left ventricle has no regional wall motion abnormalities. There is mild concentric left ventricular hypertrophy. Left ventricular diastolic parameters are consistent with Grade I diastolic dysfunction (impaired relaxation). Elevated left ventricular end-diastolic pressure. The average left ventricular global longitudinal strain is -19.8 %. The global longitudinal strain is normal.  ° 2. Right ventricular systolic function is normal. The right ventricular size is normal. There is normal pulmonary artery systolic pressure.   °3. The mitral valve is normal in structure. Trivial mitral valve regurgitation. No evidence of mitral stenosis. Moderate mitral annular calcification.  ° 4. The aortic valve is    calcified. There is severe calcifcation of the aortic valve. There is severe thickening of the aortic valve. Aortic valve regurgitation is trivial. Severe aortic valve stenosis.  ° 5. The inferior vena cava is normal in size with greater than 50% respiratory variability, suggesting right atrial pressure of 3 mmHg.  °AV Vmax:           451.00 cm/s  °AV Mean Grad:      54.0 mmHg ° °CARDIAC CATH: 12/19 °Conclusions: °Severe single-vessel CAD with 90% mid/distal LAD stenosis.  There are additional mild to moderate stenoses involving the mid and distal LAD. °Mild, non-obstructive OM and RCA disease. °Low left ventricular filling pressure. °Moderate aortic valve stenosis. °Successful PCI to 90% mid/distal LAD stenosis using Orsiro 2.5 x 13 mm DES with 0% residual stenosis and TIMI-3 flow. ° ° °STS RISK CALCULATOR:  °Risk of Mortality:  °2.189%  °Renal Failure:  °1.294%  °Permanent Stroke:  °3.345%  °Prolonged Ventilation:  °6.618%  °DSW Infection:  °0.125%  °Reoperation:  °2.341%  °Morbidity or Mortality:  °10.293%  °Short Length of Stay:  °36.645%  °Long Length of Stay:  °5.333%  ° °NHYA CLASS: II ° ° ° °ASSESSMENT AND PLAN:  ° °Nonrheumatic aortic valve stenosis - Plan: CBC, Basic metabolic panel ° °Pre-procedure lab exam - Plan: CBC, Basic metabolic panel ° °The patient has developed severe symptomatic aortic stenosis.  We had a long conversation about potential treatment modalities.  She agrees that her dyspnea and ability to complete her activities of daily living has suffered recently.  She is relatively young.  I discussed treatment options including surgery versus a TAVR procedure.  I also discussed in detail how she may likely require another procedure in her lifetime.  Her CT scan will be important to adjudicate which treatment modality will be best in this initial phase.  Certainly if she has a problematic risk plane or is high risk for coronary obstruction, then I think surgery is her best option.  However if  her anatomy is suitable then I think TAVR could be feasible.  I will refer the patient for cardiac catheterization as well as a cardiothoracic surgical opinion.  Again the CT scan will be imperative to review carefully. ° °I have personally reviewed the patients imaging data as summarized above. ° °I have reviewed the natural history of aortic stenosis with the patient and family members who are present today. We have discussed the limitations of medical therapy and the poor prognosis associated with symptomatic aortic stenosis. We have also reviewed potential treatment options, including palliative medical therapy, conventional surgical aortic valve replacement, and transcatheter aortic valve replacement. We discussed treatment options in the context of this patient's specific comorbid medical conditions over 55 minutes.  ° °All of the patient's questions were answered today. Will make further recommendations based on the results of studies outlined above.  ° °Nitza Schmid K Morell Mears, MD  °04/21/2021 4:55 PM    °[facility] °[address], [place], [place]  [address] °Phone: [phone]; Fax: [phone]  ° ° ° ° °

## 2021-04-20 NOTE — H&P (View-Only) (Signed)
Patient ID: Joanna [name] MRN: [id number] DOB/AGE: Jul 31, 1950 70 y.o.  Primary Care Physician:Duncan, Elveria Rising, MD Primary Cardiologist: Candee Furbish, MD   PROBLEM LIST: 1.  Severe aortic stenosis with a mean gradient of 54 mmHg and a normal ejection fraction; EKG demonstrates normal sinus rhythm 2.  Coronary artery disease status post mid LAD stenting 2019 3.  Hypertension 4.  Hyperlipidemia 5.  Type 2 Diabetes Mellitus 6.  History of stroke 2017 without residual deficits  HISTORY OF PRESENT ILLNESS: The patient is a 70 y.o. year old female with the indicated medical issues referred by Dr. Marlou Porch for recommendations regarding the patient's aortic valvular disease.  The patient has noted increasing dyspnea and fatigue.  She fortunately has had no chest pain.  She denies any presyncope or syncope.  Complicating matters is the fact that she broke her ankle a couple of years ago and since that time she has been relatively sedentary.  She did go to [facility] within the last year and stayed at a house with 4 sets of stairs and was moderately short of breath after ascending these.  She fortunately has not had any presyncope or syncope.  She denies any paroxysmal nocturnal dyspnea.  She has required no emergency room visits or hospitalizations.  Of note she did have a stroke presenting with left-sided weakness in 2017.  No significant carotid disease was seen.  She has no residual deficits from this.  Past Medical History:  Diagnosis Date   Abnormal liver function tests    Adenomatous colon polyp    Aortic stenosis, moderate 05/09/2018   CAD (coronary artery disease) 05/09/2018   a. LHC 05/09/18: DES to mid/dist LAD, mod AS   Cerebrovascular disease    a. CT 04/2016 -calcific plaque at the origin LEFT vertebral contributing to severe stenosis.   Diverticulitis of colon    Fatty liver    a. mild fatty liver infiltration by CT 2014.   Foot injury 2008   Left foot/leg with  ?? torn muscle -PROBLEM HAS RESOLVED   Hand pain 05/2007   steroid injections--RESOLVED   Heart murmur    History of kidney stones    Hx of renal calculi    LEFT   Hyperlipidemia    Hypertension    Hypothyroidism    pt states hx of thyroid nodules   IBS (irritable bowel syndrome)    Internal hemorrhoids    Melanoma ([place]) 1980s   mid upper stomach (05/08/2018)   OSA on CPAP     SETTING IS 14 (05/08/2018)   Statin intolerance    Stroke ([place])    a. right thalamic infarct in 04/2016 felt by neuro to be due to small vessel disease.   Type II diabetes mellitus ([place])     Past Surgical History:  Procedure Laterality Date   CARDIAC CATHETERIZATION     CATARACT EXTRACTION W/ INTRAOCULAR LENS  IMPLANT, BILATERAL Bilateral 08/2016   COLONOSCOPY W/ BIOPSIES AND POLYPECTOMY  05/19/2005   adenomatous polyps   CORONARY STENT INTERVENTION N/A 05/09/2018   Procedure: CORONARY STENT INTERVENTION;  Surgeon: Nelva Bush, MD;  Location: [facility];  Service: Cardiovascular;  Laterality: N/A;   LEFT HEART CATH AND CORONARY ANGIOGRAPHY N/A 05/09/2018   Procedure: LEFT HEART CATH AND CORONARY ANGIOGRAPHY;  Surgeon: Nelva Bush, MD;  Location: [facility];  Service: Cardiovascular;  Laterality: N/A;   MELANOMA EXCISION  1980s   mid upper stomach   NEPHROLITHOTOMY Left 01/27/2013  Procedure: NEPHROLITHOTOMY PERCUTANEOUS;  Surgeon: Dutch Gray, MD;  Location: [facility];  Service: Urology;  Laterality: Left;   TUBAL LIGATION     WISDOM TEETH EXTRACTED      Family History  Problem Relation Age of Onset   Hyperlipidemia Mother    Neurofibromatosis Father    Thyroid disease Daughter    Hyperlipidemia Sister    Hyperlipidemia Brother    Diabetes Neg Hx    Stroke Neg Hx    Colon cancer Neg Hx    Esophageal cancer Neg Hx    Pancreatic cancer Neg Hx    Liver cancer Neg Hx     Social History   Socioeconomic History   Marital status: Married    Spouse name: Not on file    Number of children: 3   Years of education: 13   Highest education level: High school graduate  Occupational History   Occupation: Producer, 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 She does office work. One caffeinated drink daily.   Updated as of 04/30/2013   Social Determinants of Health   Financial Resource Strain: Not on file  Food Insecurity: Not on file  Transportation Needs: Not on file  Physical Activity: Not on file  Stress: Not on file  Social Connections: Not on file  Intimate Partner Violence: Not on file     Prior to Admission medications   Medication Sig Start Date End Date Taking? Authorizing Provider  amLODipine (NORVASC) 10 MG tablet TAKE 1/2 TABLET BY MOUTH DAILY 06/04/20   Jerline Pain, MD  aspirin EC 81 MG tablet Take 1 tablet (81 mg total) by mouth daily. 05/11/16   Dessa Phi, DO  Blood Glucose Monitoring Suppl (ONETOUCH VERIO) w/Device KIT by Does not apply route. Use to check blood sugars once daily.    [provider]  carvedilol (COREG) 6.25 MG tablet Take 1 tablet (6.25 mg total) by mouth 2 (two) times daily. 04/20/20   Jerline Pain, MD  cholecalciferol (VITAMIN D) 1000 units tablet Take 5,000 Units by mouth daily.     [provider]  Dulaglutide (TRULICITY) 1.5 ZE/0.9QZ SOPN Inject weekly 02/15/21   Elayne Snare, MD  glimepiride (AMARYL) 1 MG tablet TAKE 1 TABLET BY MOUTH EVERYDAY AT BEDTIME 03/07/21   Elayne Snare, MD  glucose blood (ONE TOUCH ULTRA TEST) test strip USE TO CHECK BLOOD SUGAR TWICE DAILY  Dx:E11.65 10/22/17   Elayne Snare, MD  glucose blood (ONETOUCH VERIO) test strip Use Onetouch Verio test strips  once daily to test blood sugars 01/01/20   Elayne Snare, MD  glucose blood test strip 1 each by Other route as needed for other. OneTouch Verio test strips . Test blood sugars once daily    [provider]  Lancets [facility] DELICA PLUS [id number]) MISC Use to test blood sugars once daily. 01/01/20   Elayne Snare, MD  Lancets [facility] ULTRASOFT) lancets 1 each by Other route as needed for other. Use to check blood sugars once daily.    [provider]  Magnesium 250 MG TABS Take 1 tablet  by mouth 2 (two) times daily.    [provider]  metFORMIN (GLUCOPHAGE) 1000 MG tablet TAKE 1 TABLET BY MOUTH TWICE A DAY 09/10/20   Elayne Snare, MD  nitroGLYCERIN (NITROSTAT) 0.4 MG SL tablet Place 1 tablet (0.4 mg total) under the tongue every 5 (five) minutes x 3 doses as needed for chest pain. 05/10/18   Daune Perch, NP  OneTouch Delica Lancets 82M MISC by Does not apply route. Use to test blood sugars once daily    [provider]  ramipril (ALTACE) 10 MG capsule TAKE 1 CAPSULE BY MOUTH EVERY DAY 04/11/21   Jerline Pain, MD  rosuvastatin (CRESTOR) 5 MG tablet TAKE 1/2 TABLET BY MOUTH DAILY 06/04/20   Jerline Pain, MD  SYNTHROID 125 MCG tablet TAKE 1 TABLET BY MOUTH DAILY BEFORE BREAKFAST. 12/29/20   Elayne Snare, MD    Allergies  Allergen Reactions   Crestor [Rosuvastatin]     REACTION: N/V and heartburn   Lipitor [Atorvastatin]     REACTION: Hot flashes and flu-like symptoms   Pravastatin Sodium     REACTION: Neck swelling and pain in her shoulders and arms.   Sulfonamide Derivatives     Rxn Unknown   Zetia [Ezetimibe]     GI upset   Zocor [Simvastatin] Other (See Comments)    REACTION: GI.Marland Kitchen Pt unknown of severity   Niacin     REACTION: n/v    REVIEW OF SYSTEMS:  General: no fevers/chills/night sweats Eyes: no blurry vision, diplopia, or amaurosis ENT: no sore throat or hearing loss Resp: no cough, wheezing, or hemoptysis CV: no edema or palpitations GI:  no abdominal pain, nausea, vomiting, diarrhea, or constipation GU: no dysuria, frequency, or hematuria Skin: no rash Neuro: no headache, numbness, tingling, or weakness of extremities Musculoskeletal: no joint pain or swelling Heme: no bleeding, DVT, or easy bruising Endo: no polydipsia or polyuria  BP 138/72 (BP Location: Right Arm, Patient Position: Sitting, Cuff Size: Normal)    Pulse (!) 59    Ht 5' 3.5" (1.613 m)    Wt 153 lb 12.8 oz (69.8 kg)    SpO2 99%    BMI 26.82 kg/m   PHYSICAL EXAM: GEN:  AO x 3 in no acute distress HEENT: normal Dentition: Normal Neck: JVP normal. +2 carotid upstrokes without bruits. No thyromegaly. Lungs: equal expansion, clear bilaterally CV: Apex is discrete and nondisplaced, RRR with 4/6 SEM Abd: soft, non-tender, non-distended; no bruit; positive bowel sounds Ext: no edema, ecchymoses, or cyanosis Vascular: 2+ femoral pulses, 2+ radial pulses       Skin: warm and dry without rash Neuro: CN II-XII grossly intact; motor and sensory grossly intact    DATA AND STUDIES:  EKG: Normal sinus rhythm with no conduction abnormalities  2D ECHO:  11/22 1. Left ventricular ejection fraction, by estimation, is 60 to 65%. The left ventricle has normal function. The left ventricle has no regional wall motion abnormalities. There is mild concentric left ventricular hypertrophy. Left ventricular diastolic parameters are consistent with Grade I diastolic dysfunction (impaired relaxation). Elevated left ventricular end-diastolic pressure. The average left ventricular global longitudinal strain is -19.8 %. The global longitudinal strain is normal.   2. Right ventricular systolic function is normal. The right ventricular size is normal. There is normal pulmonary artery systolic pressure.   3. The mitral valve is normal in structure. Trivial mitral valve regurgitation. No evidence of mitral stenosis. Moderate mitral annular calcification.   4. The aortic valve is  calcified. There is severe calcifcation of the aortic valve. There is severe thickening of the aortic valve. Aortic valve regurgitation is trivial. Severe aortic valve stenosis.   5. The inferior vena cava is normal in size with greater than 50% respiratory variability, suggesting right atrial pressure of 3 mmHg.  AV Vmax:           451.00 cm/s  AV Mean Grad:      54.0 mmHg  CARDIAC CATH: 12/19 Conclusions: Severe single-vessel CAD with 90% mid/distal LAD stenosis.  There are additional mild to moderate stenoses involving the mid and distal LAD. Mild, non-obstructive OM and RCA disease. Low left ventricular filling pressure. Moderate aortic valve stenosis. Successful PCI to 90% mid/distal LAD stenosis using Orsiro 2.5 x 13 mm DES with 0% residual stenosis and TIMI-3 flow.   STS RISK CALCULATOR:  Risk of Mortality:  2.189%  Renal Failure:  1.294%  Permanent Stroke:  3.345%  Prolonged Ventilation:  6.618%  DSW Infection:  0.125%  Reoperation:  2.341%  Morbidity or Mortality:  10.293%  Short Length of Stay:  36.645%  Long Length of Stay:  5.333%   NHYA CLASS: II    ASSESSMENT AND PLAN:   Nonrheumatic aortic valve stenosis - Plan: CBC, Basic metabolic panel  Pre-procedure lab exam - Plan: CBC, Basic metabolic panel  The patient has developed severe symptomatic aortic stenosis.  We had a long conversation about potential treatment modalities.  She agrees that her dyspnea and ability to complete her activities of daily living has suffered recently.  She is relatively young.  I discussed treatment options including surgery versus a TAVR procedure.  I also discussed in detail how she may likely require another procedure in her lifetime.  Her CT scan will be important to adjudicate which treatment modality will be best in this initial phase.  Certainly if she has a problematic risk plane or is high risk for coronary obstruction, then I think surgery is her best option.  However if  her anatomy is suitable then I think TAVR could be feasible.  I will refer the patient for cardiac catheterization as well as a cardiothoracic surgical opinion.  Again the CT scan will be imperative to review carefully.  I have personally reviewed the patients imaging data as summarized above.  I have reviewed the natural history of aortic stenosis with the patient and family members who are present today. We have discussed the limitations of medical therapy and the poor prognosis associated with symptomatic aortic stenosis. We have also reviewed potential treatment options, including palliative medical therapy, conventional surgical aortic valve replacement, and transcatheter aortic valve replacement. We discussed treatment options in the context of this patient's specific comorbid medical conditions over 55 minutes.   All of the patient's questions were answered today. Will make further recommendations based on the results of studies outlined above.   Early Osmond, MD  04/21/2021 4:55 PM    [facility] [place], [place], [place]  [address] Phone: [phone]; Fax: [phone]

## 2021-04-21 ENCOUNTER — Ambulatory Visit (INDEPENDENT_AMBULATORY_CARE_PROVIDER_SITE_OTHER): Payer: Medicare Other | Admitting: Internal Medicine

## 2021-04-21 ENCOUNTER — Other Ambulatory Visit: Payer: Self-pay | Admitting: Internal Medicine

## 2021-04-21 ENCOUNTER — Other Ambulatory Visit: Payer: Self-pay

## 2021-04-21 ENCOUNTER — Encounter: Payer: Self-pay | Admitting: Internal Medicine

## 2021-04-21 VITALS — BP 138/72 | HR 59 | Ht 63.5 in | Wt 153.8 lb

## 2021-04-21 DIAGNOSIS — I350 Nonrheumatic aortic (valve) stenosis: Secondary | ICD-10-CM | POA: Diagnosis not present

## 2021-04-21 DIAGNOSIS — Z01812 Encounter for preprocedural laboratory examination: Secondary | ICD-10-CM

## 2021-04-21 DIAGNOSIS — Z01818 Encounter for other preprocedural examination: Secondary | ICD-10-CM | POA: Diagnosis not present

## 2021-04-21 MED ORDER — SODIUM CHLORIDE 0.9% FLUSH: 3.0000 mL | Freq: Two times a day (BID) | INTRAVENOUS | Status: DC

## 2021-04-21 NOTE — Progress Notes (Signed)
Pre Surgical Assessment: 5 M Walk Test  62M=16.51ft  5 Meter Walk Test- trial 1: 4.67 seconds 5 Meter Walk Test- trial 2: 3.81 seconds 5 Meter Walk Test- trial 3: 3.78 seconds 5 Meter Walk Test Average: 4.09 seconds

## 2021-04-21 NOTE — Patient Instructions (Signed)
Medication Instructions:  No changes *If you need a refill on your cardiac medications before your next appointment, please call your pharmacy*   Lab Work: Today: BMET, CBC   Testing/Procedures: Your physician has requested that you have a cardiac catheterization. Cardiac catheterization is used to diagnose and/or treat various heart conditions. Doctors may recommend this procedure for a number of different reasons. The most common reason is to evaluate chest pain. Chest pain can be a symptom of coronary artery disease (CAD), and cardiac catheterization can show whether plaque is narrowing or blocking your heart's arteries. This procedure is also used to evaluate the valves, as well as measure the blood flow and oxygen levels in different parts of your heart. For further information please visit [email or web address]. Please follow instruction sheet, as given.   Follow-Up: Per Structural Heart Valve Team    Other Instructions  [place] OFFICE [place], [address] [place] [address] Dept: [phone] Loc: [phone]  Joanna [name]  04/21/2021  You are scheduled for a Cardiac Catheterization on Wednesday, December 14 with Dr. Lenna Sciara.  1. Please arrive at the [facility] (Main Entrance A) at [facility]: [address] [place], [place] [address] at 6:30 AM (This time is two hours before your procedure to ensure your preparation). Free valet parking service is available.   Special note: Every effort is made to have your procedure done on time. Please understand that emergencies sometimes delay scheduled procedures.  2. Diet: Do not eat solid foods after midnight.  The patient may have clear liquids until 5am upon the day of the procedure.  3. Labs: You will need to have blood drawn today. You do not need to be fasting.  4. Medication instructions in preparation for your  procedure:   Contrast Allergy: No  Do not take Diabetes Med Glucophage (Metformin) on the day of the procedure and HOLD 48 HOURS AFTER THE PROCEDURE.  On the morning of your procedure, take your Aspirin 81 mg and any morning medicines NOT listed above.  You may use sips of water.  5. Plan for one night stay--bring personal belongings. 6. Bring a current list of your medications and current insurance cards. 7. You MUST have a responsible person to drive you home. 8. Someone MUST be with you the first 24 hours after you arrive home or your discharge will be delayed. 9. Please wear clothes that are easy to get on and off and wear slip-on shoes.  Thank you for allowing [place] to care for you!   -- [facility]

## 2021-04-22 LAB — CBC
Hematocrit: 39.8 % (ref 34.0–46.6)
Hemoglobin: 13.3 g/dL (ref 11.1–15.9)
MCH: 28.6 pg (ref 26.6–33.0)
MCHC: 33.4 g/dL (ref 31.5–35.7)
MCV: 86 fL (ref 79–97)
Platelets: 243 10*3/uL (ref 150–450)
RBC: 4.65 x10E6/uL (ref 3.77–5.28)
RDW: 13 % (ref 11.7–15.4)
WBC: 8.3 10*3/uL (ref 3.4–10.8)

## 2021-04-22 LAB — BASIC METABOLIC PANEL
BUN/Creatinine Ratio: 24 (ref 12–28)
BUN: 17 mg/dL (ref 8–27)
CO2: 28 mmol/L (ref 20–29)
Calcium: 10.8 mg/dL — ABNORMAL HIGH (ref 8.7–10.3)
Chloride: 99 mmol/L (ref 96–106)
Creatinine, Ser: 0.71 mg/dL (ref 0.57–1.00)
Glucose: 80 mg/dL (ref 70–99)
Potassium: 5 mmol/L (ref 3.5–5.2)
Sodium: 141 mmol/L (ref 134–144)
eGFR: 93 mL/min/{1.73_m2} (ref >59)

## 2021-04-28 ENCOUNTER — Other Ambulatory Visit: Payer: Self-pay | Admitting: Endocrinology

## 2021-04-28 ENCOUNTER — Encounter: Payer: Self-pay | Admitting: Cardiology

## 2021-04-28 ENCOUNTER — Other Ambulatory Visit: Payer: Self-pay | Admitting: Cardiology

## 2021-04-28 DIAGNOSIS — I350 Nonrheumatic aortic (valve) stenosis: Secondary | ICD-10-CM

## 2021-04-28 DIAGNOSIS — Z01818 Encounter for other preprocedural examination: Secondary | ICD-10-CM

## 2021-05-02 ENCOUNTER — Telehealth: Payer: Self-pay | Admitting: *Deleted

## 2021-05-02 NOTE — Telephone Encounter (Signed)
Cardiac catheterization scheduled at [facility] for: Wednesday May 04, 2021 8:30 AM [facility] Main Entrance [facility]) at: 6:30 AM   Diet-no solid food after midnight prior to cath, clear liquids until 5 AM day of procedure.  Medication instructions for procedure: -Hold:  Metformin-day of procedure and 48 hours post procedure -Glimepirde-AM of procedure -Except hold medications usual morning medications can be taken pre-cath with sips of water including aspirin 81 mg.    Confirmed patient has responsible adult to drive home post procedure and be with patient first 24 hours after arriving home.  [facility] does allow one visitor to accompany you and wait in the hospital waiting room while you are there for your procedure. You and your visitor will be asked to wear a mask once you enter the hospital.   Patient reports does not currently have any new symptoms concerning for COVID-19 and no household members with COVID-19 like illness.    Left message to call back to review procedure instructions.

## 2021-05-03 NOTE — Telephone Encounter (Signed)
Left message for patient to call back to review cath instructions.

## 2021-05-03 NOTE — Telephone Encounter (Signed)
No answer, voicemail message. 

## 2021-05-04 ENCOUNTER — Ambulatory Visit (HOSPITAL_COMMUNITY)
Admission: RE | Admit: 2021-05-04 | Discharge: 2021-05-04 | Disposition: A | Discharge status: Home / Self Care | Payer: Medicare Other | Attending: Internal Medicine | Admitting: Internal Medicine

## 2021-05-04 ENCOUNTER — Encounter (HOSPITAL_COMMUNITY): Payer: Self-pay | Admitting: Internal Medicine

## 2021-05-04 ENCOUNTER — Encounter (HOSPITAL_COMMUNITY)
Admission: RE | Disposition: A | Discharge status: Home / Self Care | Payer: Self-pay | Source: Home / Self Care | Attending: Internal Medicine

## 2021-05-04 ENCOUNTER — Other Ambulatory Visit: Payer: Self-pay

## 2021-05-04 DIAGNOSIS — E785 Hyperlipidemia, unspecified: Secondary | ICD-10-CM | POA: Insufficient documentation

## 2021-05-04 DIAGNOSIS — I10 Essential (primary) hypertension: Secondary | ICD-10-CM | POA: Insufficient documentation

## 2021-05-04 DIAGNOSIS — E119 Type 2 diabetes mellitus without complications: Secondary | ICD-10-CM | POA: Diagnosis not present

## 2021-05-04 DIAGNOSIS — Z8673 Personal history of transient ischemic attack (TIA), and cerebral infarction without residual deficits: Secondary | ICD-10-CM | POA: Diagnosis not present

## 2021-05-04 DIAGNOSIS — Z955 Presence of coronary angioplasty implant and graft: Secondary | ICD-10-CM | POA: Insufficient documentation

## 2021-05-04 DIAGNOSIS — I2510 Atherosclerotic heart disease of native coronary artery without angina pectoris: Secondary | ICD-10-CM | POA: Diagnosis not present

## 2021-05-04 DIAGNOSIS — I350 Nonrheumatic aortic (valve) stenosis: Secondary | ICD-10-CM | POA: Diagnosis not present

## 2021-05-04 HISTORY — PX: RIGHT/LEFT HEART CATH AND CORONARY ANGIOGRAPHY: CATH118266

## 2021-05-04 LAB — POCT I-STAT EG7
Acid-Base Excess: 2 mmol/L (ref 0.0–2.0)
Acid-Base Excess: 4 mmol/L — ABNORMAL HIGH (ref 0.0–2.0)
Bicarbonate: 27.7 mmol/L (ref 20.0–28.0)
Bicarbonate: 29.4 mmol/L — ABNORMAL HIGH (ref 20.0–28.0)
Calcium, Ion: 1.1 mmol/L — ABNORMAL LOW (ref 1.15–1.40)
Calcium, Ion: 1.29 mmol/L (ref 1.15–1.40)
HCT: 34 % — ABNORMAL LOW (ref 36.0–46.0)
HCT: 37 % (ref 36.0–46.0)
Hemoglobin: 11.6 g/dL — ABNORMAL LOW (ref 12.0–15.0)
Hemoglobin: 12.6 g/dL (ref 12.0–15.0)
O2 Saturation: 68 %
O2 Saturation: 79 %
Potassium: 3.4 mmol/L — ABNORMAL LOW (ref 3.5–5.1)
Potassium: 3.9 mmol/L (ref 3.5–5.1)
Sodium: 141 mmol/L (ref 135–145)
Sodium: 145 mmol/L (ref 135–145)
TCO2: 29 mmol/L (ref 22–32)
TCO2: 31 mmol/L (ref 22–32)
pCO2, Ven: 47.3 mmHg (ref 44.0–60.0)
pCO2, Ven: 48.4 mmHg (ref 44.0–60.0)
pH, Ven: 7.375 (ref 7.250–7.430)
pH, Ven: 7.392 (ref 7.250–7.430)
pO2, Ven: 37 mmHg (ref 32.0–45.0)
pO2, Ven: 44 mmHg (ref 32.0–45.0)

## 2021-05-04 LAB — POCT I-STAT 7, (LYTES, BLD GAS, ICA,H+H)
Acid-Base Excess: 4 mmol/L — ABNORMAL HIGH (ref 0.0–2.0)
Bicarbonate: 28.2 mmol/L — ABNORMAL HIGH (ref 20.0–28.0)
Calcium, Ion: 1.24 mmol/L (ref 1.15–1.40)
HCT: 35 % — ABNORMAL LOW (ref 36.0–46.0)
Hemoglobin: 11.9 g/dL — ABNORMAL LOW (ref 12.0–15.0)
O2 Saturation: 98 %
Potassium: 3.8 mmol/L (ref 3.5–5.1)
Sodium: 140 mmol/L (ref 135–145)
TCO2: 29 mmol/L (ref 22–32)
pCO2 arterial: 40.6 mmHg (ref 32.0–48.0)
pH, Arterial: 7.45 (ref 7.350–7.450)
pO2, Arterial: 94 mmHg (ref 83.0–108.0)

## 2021-05-04 LAB — GLUCOSE, CAPILLARY: Glucose-Capillary: 130 mg/dL — ABNORMAL HIGH (ref 70–99)

## 2021-05-04 SURGERY — RIGHT/LEFT HEART CATH AND CORONARY ANGIOGRAPHY
Anesthesia: LOCAL

## 2021-05-04 MED ORDER — HEPARIN (PORCINE) IN NACL 1000-0.9 UT/500ML-% IV SOLN
INTRAVENOUS | Status: DC | PRN
  Administered 2021-05-04 (×2): 500 mL

## 2021-05-04 MED ORDER — SODIUM CHLORIDE 0.9 % IV SOLN: 250.0000 mL | INTRAVENOUS | Status: DC | PRN

## 2021-05-04 MED ORDER — HEPARIN SODIUM (PORCINE) 1000 UNIT/ML IJ SOLN
INTRAMUSCULAR | Status: DC | PRN
  Administered 2021-05-04: 5000 [IU] via INTRAVENOUS

## 2021-05-04 MED ORDER — SODIUM CHLORIDE 0.9% FLUSH: 3.0000 mL | INTRAVENOUS | Status: DC | PRN

## 2021-05-04 MED ORDER — SODIUM CHLORIDE 0.9 % IV SOLN: INTRAVENOUS | Status: DC

## 2021-05-04 MED ORDER — ACETAMINOPHEN 325 MG PO TABS: 650.0000 mg | ORAL_TABLET | ORAL | Status: DC | PRN

## 2021-05-04 MED ORDER — MIDAZOLAM HCL 2 MG/2ML IJ SOLN
INTRAMUSCULAR | Status: AC
  Filled 2021-05-04: qty 2

## 2021-05-04 MED ORDER — HEPARIN SODIUM (PORCINE) 1000 UNIT/ML IJ SOLN
INTRAMUSCULAR | Status: AC
  Filled 2021-05-04: qty 10

## 2021-05-04 MED ORDER — ASPIRIN 81 MG PO CHEW
81.0000 mg | CHEWABLE_TABLET | ORAL | Status: DC
  Filled 2021-05-04: qty 1

## 2021-05-04 MED ORDER — VERAPAMIL HCL 2.5 MG/ML IV SOLN: INTRAVENOUS | Status: DC | PRN

## 2021-05-04 MED ORDER — FENTANYL CITRATE (PF) 100 MCG/2ML IJ SOLN
INTRAMUSCULAR | Status: DC | PRN
  Administered 2021-05-04: 25 ug via INTRAVENOUS

## 2021-05-04 MED ORDER — ONDANSETRON HCL 4 MG/2ML IJ SOLN: 4.0000 mg | Freq: Four times a day (QID) | INTRAMUSCULAR | Status: DC | PRN

## 2021-05-04 MED ORDER — HYDRALAZINE HCL 20 MG/ML IJ SOLN: 10.0000 mg | INTRAMUSCULAR | Status: DC | PRN

## 2021-05-04 MED ORDER — LIDOCAINE HCL (PF) 1 % IJ SOLN
INTRAMUSCULAR | Status: AC
  Filled 2021-05-04: qty 30

## 2021-05-04 MED ORDER — LIDOCAINE HCL (PF) 1 % IJ SOLN
INTRAMUSCULAR | Status: DC | PRN
  Administered 2021-05-04 (×2): 2 mL

## 2021-05-04 MED ORDER — IOHEXOL 350 MG/ML SOLN
INTRAVENOUS | Status: DC | PRN
  Administered 2021-05-04: 09:00:00 65 mL

## 2021-05-04 MED ORDER — LABETALOL HCL 5 MG/ML IV SOLN: 10.0000 mg | INTRAVENOUS | Status: DC | PRN

## 2021-05-04 MED ORDER — MIDAZOLAM HCL 2 MG/2ML IJ SOLN
INTRAMUSCULAR | Status: DC | PRN
  Administered 2021-05-04: 1 mg via INTRAVENOUS

## 2021-05-04 MED ORDER — FENTANYL CITRATE (PF) 100 MCG/2ML IJ SOLN
INTRAMUSCULAR | Status: AC
  Filled 2021-05-04: qty 2

## 2021-05-04 MED ORDER — VERAPAMIL HCL 2.5 MG/ML IV SOLN
INTRAVENOUS | Status: AC
  Filled 2021-05-04: qty 2

## 2021-05-04 MED ORDER — HEPARIN (PORCINE) IN NACL 1000-0.9 UT/500ML-% IV SOLN
INTRAVENOUS | Status: AC
  Filled 2021-05-04: qty 1000

## 2021-05-04 MED ORDER — SODIUM CHLORIDE 0.9% FLUSH: 3.0000 mL | Freq: Two times a day (BID) | INTRAVENOUS | Status: DC

## 2021-05-04 SURGICAL SUPPLY — 15 items
CATH BALLN WEDGE 5F 110CM (CATHETERS) ×1 IMPLANT
CATH DIAG 6FR JR4 (CATHETERS) ×1 IMPLANT
CATH DIAG 6FR PIGTAIL ANGLED (CATHETERS) ×1 IMPLANT
CATH INFINITI 6F FL3.5 (CATHETERS) ×1 IMPLANT
DEVICE RAD COMP TR BAND LRG (VASCULAR PRODUCTS) ×1 IMPLANT
GLIDESHEATH SLEND SS 6F .021 (SHEATH) ×1 IMPLANT
GUIDEWIRE .025 260CM (WIRE) ×1 IMPLANT
GUIDEWIRE INQWIRE 1.5J.035X260 (WIRE) IMPLANT
INQWIRE 1.5J .035X260CM (WIRE) ×2
KIT HEART LEFT (KITS) ×2 IMPLANT
PACK CARDIAC CATHETERIZATION (CUSTOM PROCEDURE TRAY) ×2 IMPLANT
SHEATH GLIDE SLENDER 4/5FR (SHEATH) ×1 IMPLANT
SYR MEDRAD MARK 7 150ML (SYRINGE) ×2 IMPLANT
TRANSDUCER W/STOPCOCK (MISCELLANEOUS) ×2 IMPLANT
TUBING CIL FLEX 10 FLL-RA (TUBING) ×2 IMPLANT

## 2021-05-04 NOTE — Interval H&P Note (Signed)
History and Physical Interval Note:  05/04/2021 6:58 AM  [name]  has presented today for surgery, with the diagnosis of severe aortic stenosis.  The various methods of treatment have been discussed with the patient and family. After consideration of risks, benefits and other options for treatment, the patient has consented to  Procedure(s): RIGHT/LEFT HEART CATH AND CORONARY ANGIOGRAPHY (N/A) as a surgical intervention.  The patient's history has been reviewed, patient examined, no change in status, stable for surgery.  I have reviewed the patient's chart and labs.  Questions were answered to the patient's satisfaction.    Cath Lab Visit (complete for each Cath Lab visit)  Clinical Evaluation Leading to the Procedure:   ACS: No.  Non-ACS:    Anginal Classification: CCS II  Anti-ischemic medical therapy: Minimal Therapy (1 class of medications)  Non-Invasive Test Results: No non-invasive testing performed  Prior CABG: No previous CABG        Early Osmond

## 2021-05-04 NOTE — Discharge Instructions (Addendum)
Resume metformin on 05/06/2021

## 2021-05-10 ENCOUNTER — Other Ambulatory Visit: Payer: Medicare Other

## 2021-05-12 ENCOUNTER — Other Ambulatory Visit: Payer: Self-pay

## 2021-05-12 ENCOUNTER — Ambulatory Visit (HOSPITAL_COMMUNITY)
Admission: RE | Admit: 2021-05-12 | Discharge: 2021-05-12 | Disposition: A | Discharge status: Home / Self Care | Payer: Medicare Other | Source: Ambulatory Visit | Attending: Cardiology | Admitting: Cardiology

## 2021-05-12 ENCOUNTER — Other Ambulatory Visit: Payer: Self-pay | Admitting: Cardiology

## 2021-05-12 DIAGNOSIS — Z01818 Encounter for other preprocedural examination: Secondary | ICD-10-CM

## 2021-05-12 DIAGNOSIS — I350 Nonrheumatic aortic (valve) stenosis: Secondary | ICD-10-CM | POA: Insufficient documentation

## 2021-05-12 DIAGNOSIS — K5730 Diverticulosis of large intestine without perforation or abscess without bleeding: Secondary | ICD-10-CM | POA: Diagnosis not present

## 2021-05-12 DIAGNOSIS — N200 Calculus of kidney: Secondary | ICD-10-CM | POA: Diagnosis not present

## 2021-05-12 DIAGNOSIS — I2510 Atherosclerotic heart disease of native coronary artery without angina pectoris: Secondary | ICD-10-CM | POA: Diagnosis not present

## 2021-05-12 MED ORDER — IOHEXOL 350 MG/ML SOLN
100.0000 mL | Freq: Once | INTRAVENOUS | Status: AC | PRN
  Administered 2021-05-12: 12:00:00 100 mL via INTRAVENOUS

## 2021-05-12 MED ORDER — NITROGLYCERIN 0.4 MG SL SUBL: 0.8000 mg | SUBLINGUAL_TABLET | Freq: Once | SUBLINGUAL | Status: DC

## 2021-05-19 ENCOUNTER — Ambulatory Visit: Payer: Medicare Other | Admitting: Endocrinology

## 2021-05-26 ENCOUNTER — Other Ambulatory Visit: Payer: Self-pay

## 2021-05-26 ENCOUNTER — Other Ambulatory Visit (INDEPENDENT_AMBULATORY_CARE_PROVIDER_SITE_OTHER): Payer: Medicare Other

## 2021-05-26 DIAGNOSIS — E559 Vitamin D deficiency, unspecified: Secondary | ICD-10-CM

## 2021-05-26 DIAGNOSIS — E063 Autoimmune thyroiditis: Secondary | ICD-10-CM

## 2021-05-26 DIAGNOSIS — E119 Type 2 diabetes mellitus without complications: Secondary | ICD-10-CM | POA: Diagnosis not present

## 2021-05-26 LAB — COMPREHENSIVE METABOLIC PANEL
ALT: 23 U/L (ref 0–35)
AST: 16 U/L (ref 0–37)
Albumin: 4.6 g/dL (ref 3.5–5.2)
Alkaline Phosphatase: 76 U/L (ref 39–117)
BUN: 14 mg/dL (ref 6–23)
CO2: 31 mEq/L (ref 19–32)
Calcium: 10.2 mg/dL (ref 8.4–10.5)
Chloride: 98 mEq/L (ref 96–112)
Creatinine, Ser: 0.7 mg/dL (ref 0.40–1.20)
GFR: 88.71 mL/min (ref >60.00)
Glucose, Bld: 139 mg/dL — ABNORMAL HIGH (ref 70–99)
Potassium: 4.9 mEq/L (ref 3.5–5.1)
Sodium: 137 mEq/L (ref 135–145)
Total Bilirubin: 0.9 mg/dL (ref 0.2–1.2)
Total Protein: 7.5 g/dL (ref 6.0–8.3)

## 2021-05-26 LAB — LIPID PANEL
Cholesterol: 190 mg/dL (ref 0–200)
HDL: 46.9 mg/dL (ref >39.00)
LDL Cholesterol: 118 mg/dL — ABNORMAL HIGH (ref 0–99)
NonHDL: 143.31
Total CHOL/HDL Ratio: 4
Triglycerides: 125 mg/dL (ref 0.0–149.0)
VLDL: 25 mg/dL (ref 0.0–40.0)

## 2021-05-26 LAB — HEMOGLOBIN A1C: Hgb A1c MFr Bld: 6.2 % (ref 4.6–6.5)

## 2021-05-26 LAB — VITAMIN D 25 HYDROXY (VIT D DEFICIENCY, FRACTURES): VITD: 102.03 ng/mL (ref 30.00–100.00)

## 2021-05-26 LAB — T4, FREE: Free T4: 1.05 ng/dL (ref 0.60–1.60)

## 2021-05-26 LAB — TSH: TSH: 0.38 u[IU]/mL (ref 0.35–5.50)

## 2021-05-27 NOTE — Progress Notes (Signed)
Vitamin D level is high, need to stop vitamin D supplements completely

## 2021-06-03 ENCOUNTER — Other Ambulatory Visit: Payer: Self-pay | Admitting: Endocrinology

## 2021-06-07 ENCOUNTER — Ambulatory Visit (INDEPENDENT_AMBULATORY_CARE_PROVIDER_SITE_OTHER): Payer: Medicare Other | Admitting: Endocrinology

## 2021-06-07 ENCOUNTER — Other Ambulatory Visit: Payer: Self-pay

## 2021-06-07 ENCOUNTER — Encounter: Payer: Self-pay | Admitting: Endocrinology

## 2021-06-07 VITALS — BP 118/70 | HR 60 | Wt 155.6 lb

## 2021-06-07 DIAGNOSIS — E559 Vitamin D deficiency, unspecified: Secondary | ICD-10-CM | POA: Diagnosis not present

## 2021-06-07 DIAGNOSIS — E1165 Type 2 diabetes mellitus with hyperglycemia: Secondary | ICD-10-CM | POA: Diagnosis not present

## 2021-06-07 DIAGNOSIS — E063 Autoimmune thyroiditis: Secondary | ICD-10-CM | POA: Diagnosis not present

## 2021-06-07 DIAGNOSIS — E782 Mixed hyperlipidemia: Secondary | ICD-10-CM

## 2021-06-07 MED ORDER — LEVOTHYROXINE SODIUM 125 MCG PO TABS: ORAL_TABLET | ORAL | 1 refills | Status: DC

## 2021-06-07 NOTE — Patient Instructions (Signed)
Take 1/2 thyroid on Sundays

## 2021-06-07 NOTE — Progress Notes (Signed)
Patient ID: Joanna [name], female   DOB: 1950/12/05, 71 y.o.   MRN: [id number]           Reason for Appointment: Follow-up of endocrinology problems    History of Present Illness:          Diagnosis: Type 2 diabetes mellitus, date of diagnosis: 2010?       Prior history:  On her initial consultation she was advised to start Tanzeum in addition to metformin for multiple benefits including long-term control; was started in 12/15.  Recent history:  A1c is about the same at 6.2   Non-insulin hypoglycemic drugs the patient is taking are: Metformin 1 g 2x  a day, Trulicity 1.5 mg weekly, Amaryl 1 mg at bedtime.     Glucose patterns and problems identified: She has again been using a generic [employer] meter because she believes insurance does not cover her test strips even with Medicare She thinks her insurance does not cover test trips Recently has not been able to exercise because of weakness and fatigue but has been able to maintain her weight loss from last year As before her fasting readings tend to be relatively higher, 139 in the lab  She thinks her blood sugars may be as low as 80 at times but generally only about 140 at the most after eating  Not clear how often she monitors  Previously was wanting to stop her Trulicity but has agreed to continue the 1.5 dose instead of the 3 mg She does not think she has any increased hunger with this No hypoglycemia reported     Side effects from medications have been: none    Glucose monitoring:  done  twice a day         Glucometer:  [employer] brand  Blood Glucose readings  from recall  Am 120-130; PC 140  128-135, PC under 130  Self-care: The diet that the patient has been following is: tries to limit fat intake.      Meals: 3 meals per day. Breakfast is eggs, , sometimes cottage cheese   Lunch is a sandwich or soup.  Snacks: Cottage cheese and fruit, no sweet drinks juices           Dietician visit, most recent: never      CDE visit: 1/16             Weight history:    Wt Readings from Last 3 Encounters:  06/07/21 155 lb 9.6 oz (70.6 kg)  05/04/21 150 lb (68 kg)  04/21/21 153 lb 12.8 oz (69.8 kg)    Glycemic control:   Lab Results  Component Value Date   HGBA1C 6.2 05/26/2021   HGBA1C 6.0 02/10/2021   HGBA1C 6.2 07/07/2020   Lab Results  Component Value Date   MICROALBUR 8.7 (H) 07/07/2020   LDLCALC 118 (H) 05/26/2021   CREATININE 0.70 05/26/2021   Lab Results  Component Value Date   FRUCTOSAMINE 262 08/12/2019   FRUCTOSAMINE 247 06/02/2016   FRUCTOSAMINE 274 (H) 07/16/2014    Past diabetes history:  For several years prior to her diagnosis she had been complaining of her feet burning Her blood sugars were mildly increased initially at diagnosis and she did try to control it with diet alone and exercise without getting them back to normal.  Her A1c had continue to be around 7% with the lowest one 6.7  About 2012 she was started on metformin probably when her A1c was 7.3.   In 2015  her blood sugars had been higher with A1c 8.1%  HYPERLIPIDEMIA: Discussed in review of systems    Allergies as of 06/07/2021       Reactions   Crestor [rosuvastatin]    REACTION: N/V and heartburn   Lipitor [atorvastatin]    REACTION: Hot flashes and flu-like symptoms   Pravastatin Sodium    REACTION: Neck swelling and pain in her shoulders and arms.   Sulfonamide Derivatives    Rxn Unknown   Zetia [ezetimibe]    GI upset   Zocor [simvastatin] Other (See Comments)   GI issues.. Pt unknown of severity   Niacin Nausea And Vomiting        Medication List        Accurate as of June 07, 2021 11:59 PM. If you have any questions, ask your nurse or doctor.          STOP taking these medications    cholecalciferol 25 MCG (1000 UNIT) tablet Commonly known as: VITAMIN D3 Stopped by: Elayne Snare, MD       TAKE these medications    amLODipine 10 MG tablet Commonly known as: NORVASC TAKE 1/2 TABLET BY  MOUTH EVERY DAY   aspirin EC 81 MG tablet Take 1 tablet (81 mg total) by mouth daily.   carvedilol 6.25 MG tablet Commonly known as: COREG TAKE 1 TABLET BY MOUTH TWICE A DAY   COD LIVER OIL PO Take 1 capsule by mouth at bedtime.   glimepiride 1 MG tablet Commonly known as: AMARYL TAKE 1 TABLET BY MOUTH EVERYDAY AT BEDTIME   glucose blood test strip Commonly known as: ONE TOUCH ULTRA TEST USE TO CHECK BLOOD SUGAR TWICE DAILY  Dx:E11.65   levothyroxine 125 MCG tablet Commonly known as: Synthroid 1 tab daily except 1/2 on Sundays What changed:  how much to take how to take this when to take this additional instructions Changed by: Elayne Snare, MD   Magnesium 500 MG Tabs Take 500 mg by mouth 2 (two) times daily.   metFORMIN 1000 MG tablet Commonly known as: GLUCOPHAGE TAKE 1 TABLET BY MOUTH TWICE A DAY   nitroGLYCERIN 0.4 MG SL tablet Commonly known as: NITROSTAT Place 1 tablet (0.4 mg total) under the tongue every 5 (five) minutes x 3 doses as needed for chest pain.   OneTouch Delica Lancets 01U Misc by Does not apply route. Use to test blood sugars once daily   OneTouch Delica Plus [id number] Misc Use to test blood sugars once daily.   OneTouch Verio w/Device Kit by Does not apply route. Use to check blood sugars once daily.   ramipril 10 MG capsule Commonly known as: ALTACE TAKE 1 CAPSULE BY MOUTH EVERY DAY   rosuvastatin 5 MG tablet Commonly known as: CRESTOR TAKE 1/2 TABLET BY MOUTH DAILY What changed: how much to take   Trulicity 1.5 OP/1.6FO Sopn Generic drug: Dulaglutide INJECT WEEKLY AS DIRECTED        Allergies:  Allergies  Allergen Reactions   Crestor [Rosuvastatin]     REACTION: N/V and heartburn   Lipitor [Atorvastatin]     REACTION: Hot flashes and flu-like symptoms   Pravastatin Sodium     REACTION: Neck swelling and pain in her shoulders and arms.   Sulfonamide Derivatives     Rxn Unknown   Zetia [Ezetimibe]     GI upset    Zocor [Simvastatin] Other (See Comments)    GI issues.. Pt unknown of severity   Niacin Nausea And Vomiting  Past Medical History:  Diagnosis Date   Abnormal liver function tests    Adenomatous colon polyp    Aortic stenosis, moderate 05/09/2018   CAD (coronary artery disease) 05/09/2018   a. LHC 05/09/18: DES to mid/dist LAD, mod AS   Cerebrovascular disease    a. CT 04/2016 -calcific plaque at the origin LEFT vertebral contributing to severe stenosis.   Diverticulitis of colon    Fatty liver    a. mild fatty liver infiltration by CT 2014.   Foot injury 2008   Left foot/leg with ?? torn muscle -PROBLEM HAS RESOLVED   Hand pain 05/2007   steroid injections--RESOLVED   Heart murmur    History of kidney stones    Hx of renal calculi    LEFT   Hyperlipidemia    Hypertension    Hypothyroidism    pt states hx of thyroid nodules   IBS (irritable bowel syndrome)    Internal hemorrhoids    Melanoma ([place]) 1980s   mid upper stomach (05/08/2018)   OSA on CPAP     SETTING IS 14 (05/08/2018)   Statin intolerance    Stroke ([place])    a. right thalamic infarct in 04/2016 felt by neuro to be due to small vessel disease.   Type II diabetes mellitus ([place])     Past Surgical History:  Procedure Laterality Date   CARDIAC CATHETERIZATION     CATARACT EXTRACTION W/ INTRAOCULAR LENS  IMPLANT, BILATERAL Bilateral 08/2016   COLONOSCOPY W/ BIOPSIES AND POLYPECTOMY  05/19/2005   adenomatous polyps   CORONARY STENT INTERVENTION N/A 05/09/2018   Procedure: CORONARY STENT INTERVENTION;  Surgeon: Nelva Bush, MD;  Location: [facility];  Service: Cardiovascular;  Laterality: N/A;   LEFT HEART CATH AND CORONARY ANGIOGRAPHY N/A 05/09/2018   Procedure: LEFT HEART CATH AND CORONARY ANGIOGRAPHY;  Surgeon: Nelva Bush, MD;  Location: [facility];  Service: Cardiovascular;  Laterality: N/A;   MELANOMA EXCISION  1980s   mid upper stomach   NEPHROLITHOTOMY Left 01/27/2013    Procedure: NEPHROLITHOTOMY PERCUTANEOUS;  Surgeon: Dutch Gray, MD;  Location: [facility];  Service: Urology;  Laterality: Left;   RIGHT/LEFT HEART CATH AND CORONARY ANGIOGRAPHY N/A 05/04/2021   Procedure: RIGHT/LEFT HEART CATH AND CORONARY ANGIOGRAPHY;  Surgeon: Early Osmond, MD;  Location: [facility];  Service: Cardiovascular;  Laterality: N/A;   TUBAL LIGATION     WISDOM TEETH EXTRACTED      Family History  Problem Relation Age of Onset   Hyperlipidemia Mother    Neurofibromatosis Father    Thyroid disease Daughter    Hyperlipidemia Sister    Hyperlipidemia Brother    Diabetes Neg Hx    Stroke Neg Hx    Colon cancer Neg Hx    Esophageal cancer Neg Hx    Pancreatic cancer Neg Hx    Liver cancer Neg Hx     Social History:  reports that she has never smoked. She has never used smokeless tobacco. She reports that she does not drink alcohol and does not use drugs.     Review of Systems        LIPIDS: she has long-standing severe hypercholesterolemia and has been intolerant to all statin drugs with various reactions Lipitor caused flulike symptoms, abdominal bloating and hot flashes, pravastatin caused neck swelling and upper body pain, Zocor caused GI upset.  Was not able to tolerate Zetia as she thinks it upset his stomach She did not want to continue Repatha because  of cost  LDL particle number previously over 2500 with ideal levels of below 1000  She is followed by cardiology and has been told to take 1/2 tablet of Crestor q 3 days  She seems to be tolerating this but she thinks that she has GI side effects with taking it every day and takes it at bedtime  LDL is generally improving although still over 100  Also taking small doses of OTC fish oil      Lab Results  Component Value Date   CHOL 190 05/26/2021   CHOL 172 02/10/2021   CHOL 188 04/22/2019   Lab Results  Component Value Date   HDL 46.90 05/26/2021   HDL 42.10 02/10/2021   HDL 44 04/22/2019   Lab  Results  Component Value Date   LDLCALC 118 (H) 05/26/2021   LDLCALC 107 (H) 02/10/2021   LDLCALC 115 (H) 04/22/2019   Lab Results  Component Value Date   TRIG 125.0 05/26/2021   TRIG 115.0 02/10/2021   TRIG 166 (H) 04/22/2019   Lab Results  Component Value Date   CHOLHDL 4 05/26/2021   CHOLHDL 4 02/10/2021   CHOLHDL 4.3 04/22/2019   Lab Results  Component Value Date   LDLDIRECT 209 (H) 03/19/2018   LDLDIRECT 170.0 12/11/2016   LDLDIRECT 178.0 08/22/2016                Thyroid:    She has had hypothyroidism for about 10 years and initially had a goiter also; highest TSH probably about 9.2 She prefers to take Brand name Synthroid and she thinks that levothyroxine does not work  She had been taking 100 mcg of Synthroid prior to her visit in 1/21 and this was lower than the recommended dose With this she was having more fatigue, sleepiness, low energy and cold intolerance along with weight gain  She is on generic levothyroxine and is supposed to be taking 6-1/2 tablets a week but occasionally will forget to skip half a tablet  Her TSH is again in the low normal range  Thyroid functions as below:  Lab Results  Component Value Date   TSH 0.38 05/26/2021   TSH 0.24 (L) 02/10/2021   TSH 1.43 07/07/2020   FREET4 1.05 05/26/2021   FREET4 0.95 02/10/2021   FREET4 1.02 07/07/2020   Lab Results  Component Value Date   TSH 0.38 05/26/2021   TSH 0.24 (L) 02/10/2021   TSH 1.43 07/07/2020   TSH 2.52 12/29/2019   TSH 1.54 08/12/2019   TSH 22.56 (H) 06/06/2019        The blood pressure is being treated with 5 mg amlodipine and 10 mg ramipril along with metoprolol This is also followed by cardiologist She tends to have high reading on the right arm    BP Readings from Last 3 Encounters:  06/07/21 118/70  05/12/21 (!) 147/83  05/04/21 126/71     Vitamin D deficiency: She was previously taking 5000 units vitamin D3 qd With her vitamin D levels being upper normal she  stopped the supplement on her own but in the meantime started taking cod liver oil for dry skin With this now her vitamin D level is above normal   FATTY liver: Has had high liver function levels for the last few years This appears to be improving  CT scan of abdomen in 2014 showed fatty infiltration of the liver  Lab Results  Component Value Date   ALT 23 05/26/2021     LABS:  No visits with results within 1 Week(s) from this visit.  Latest known visit with results is:  Lab on 05/26/2021  Component Date Value Ref Range Status   VITD 05/26/2021 102.03 (HH)  30.00 - 100.00 ng/mL Final   Cholesterol 05/26/2021 190  0 - 200 mg/dL Final   ATP III Classification       Desirable:  < 200 mg/dL               Borderline High:  200 - 239 mg/dL          High:  > = 170 mg/dL   Triglycerides 64/42/7039 125.0  0.0 - 149.0 mg/dL Final   Normal:  <920 mg/dLBorderline High:  150 - 199 mg/dL   HDL 14/80/7946 38.27  >39.00 mg/dL Final   VLDL 00/89/7271 25.0  0.0 - 40.0 mg/dL Final   LDL Cholesterol 05/26/2021 118 (H)  0 - 99 mg/dL Final   Total CHOL/HDL Ratio 05/26/2021 4   Final                  Men          Women1/2 Average Risk     3.4          3.3Average Risk          5.0          4.42X Average Risk          9.6          7.13X Average Risk          15.0          11.0                       NonHDL 05/26/2021 143.31   Final   NOTE:  Non-HDL goal should be 30 mg/dL higher than patient's LDL goal (i.e. LDL goal of < 70 mg/dL, would have non-HDL goal of < 100 mg/dL)   Free T4 04/51/9068 1.78  0.60 - 1.60 ng/dL Final   Comment: Specimens from patients who are undergoing biotin therapy and /or ingesting biotin supplements may contain high levels of biotin.  The higher biotin concentration in these specimens interferes with this Free T4 assay.  Specimens that contain high levels  of biotin may cause false high results for this Free T4 assay.  Please interpret results in light of the total clinical  presentation of the patient.     TSH 05/26/2021 0.38  0.35 - 5.50 uIU/mL Final   Sodium 05/26/2021 137  135 - 145 mEq/L Final   Potassium 05/26/2021 4.9  3.5 - 5.1 mEq/L Final   Chloride 05/26/2021 98  96 - 112 mEq/L Final   CO2 05/26/2021 31  19 - 32 mEq/L Final   Glucose, Bld 05/26/2021 139 (H)  70 - 99 mg/dL Final   BUN 95/44/4855 14  6 - 23 mg/dL Final   Creatinine, Ser 05/26/2021 0.70  0.40 - 1.20 mg/dL Final   Total Bilirubin 05/26/2021 0.9  0.2 - 1.2 mg/dL Final   Alkaline Phosphatase 05/26/2021 76  39 - 117 U/L Final   AST 05/26/2021 16  0 - 37 U/L Final   ALT 05/26/2021 23  0 - 35 U/L Final   Total Protein 05/26/2021 7.5  6.0 - 8.3 g/dL Final   Albumin 33/64/7586 4.6  3.5 - 5.2 g/dL Final   GFR 70/56/0722 88.71  >60.00 mL/min Final   Calculated using the CKD-EPI Creatinine Equation (2021)  Calcium 05/26/2021 10.2  8.4 - 10.5 mg/dL Final   Hgb A1c MFr Bld 05/26/2021 6.2  4.6 - 6.5 % Final   Glycemic Control Guidelines for People with Diabetes:Non Diabetic:  <6%Goal of Therapy: <7%Additional Action Suggested:  >8%     Physical Examination:  BP 118/70 (BP Location: Left Arm, Patient Position: Sitting, Cuff Size: Normal)    Pulse 60    Wt 155 lb 9.6 oz (70.6 kg)    SpO2 97%    BMI 27.13 kg/m            ASSESSMENT/PLAN:   Diabetes type 2    See history of present illness for details of her current blood sugar patterns and management  Her A1c is stable at 6.2  She is taking metformin, Amaryl and Trulicity 1.5 mg  She has not been able to exercise but her blood sugars at home look excellent except relatively high fasting reading as usual She will continue the same regimen, try to monitor fasting and postprandial readings consistently She needs to bring a record of her blood sugars   HYPOTHYROIDISM: Her TSH is low normal with 125 mcg of levothyroxine She will now take 6-1/2 tablets a week consistently  LIPIDS: Currently followed by cardiology  Her LDL is still over  100 She does not understand the need to get her LDL below 70 Refuses to consider Repatha  Again has complains of GI side effects even with 1 quarter tablets of Crestor 5 mg She will discuss this further with the lipid clinic   History of vitamin D deficiency: She is taking cod liver oil and with this her vitamin D level is above normal Discussed that she has likely large stores of vitamin D and she can stop supplements for at least 6 months  Fatty liver: Improved with weight loss   Patient Instructions  Take 1/2 thyroid on Sundays    Elayne Snare 06/08/2021, 8:03 AM   Note: This office note was prepared with Dragon voice recognition system technology. Any transcriptional errors that result from this process are unintentional.

## 2021-06-13 ENCOUNTER — Encounter: Payer: Self-pay | Admitting: Physician Assistant

## 2021-06-15 ENCOUNTER — Other Ambulatory Visit: Payer: Self-pay

## 2021-06-15 ENCOUNTER — Encounter: Payer: Self-pay | Admitting: Surgery

## 2021-06-15 ENCOUNTER — Institutional Professional Consult (permissible substitution) (INDEPENDENT_AMBULATORY_CARE_PROVIDER_SITE_OTHER): Payer: Medicare Other | Admitting: Surgery

## 2021-06-15 VITALS — BP 129/71 | HR 65 | Resp 20 | Ht 63.0 in

## 2021-06-15 DIAGNOSIS — I350 Nonrheumatic aortic (valve) stenosis: Secondary | ICD-10-CM

## 2021-06-16 NOTE — Progress Notes (Signed)
Patient ID: Joanna [name], female   DOB: Mar 29, 1951, 71 y.o.   MRN: [id number]   [facility]INIC        [address]       [place] [address]             [phone]          CARDIOTHORACIC SURGERY CONSULTATION REPORT  PCP is Tonia Ghent, MD Referring Provider is Lenna Sciara, MD Primary Cardiologist is Candee Furbish, MD  Reason for consultation:  Severe aortic stenosis  HPI:  The patient is a 71 year old woman with a history of hypertension, hyperlipidemia, hypothyroidism, type 2 diabetes, previous right thalamic stroke in 04/2016 felt to be due to small vessel disease without residual deficit, coronary artery disease status post DES to the mid LAD in 2019, and moderate to severe aortic stenosis.  A 2D echocardiogram in April 2022 showed a mean aortic valve gradient of 36.3 mmHg with a peak gradient of 65.5 mmHg.  Valve area was 0.63 cm.  Left ventricular ejection fraction is 65 to 70%.  She now presents with a 31-monthhistory of progressive exertional fatigue and shortness of breath.  She said she has not doing very much activity because she tires very quickly.  She has had some heaviness in her left shoulder and arm that feels like she can barely lift her arm.  This has been occurring more often but is not necessarily related to exertion.  It started after she had her stroke but has been occurring with more regularity.  She has had some lower extremity edema in her ankles.  She has had no orthopnea.  She recently started having some dizziness but no syncope.  She had a repeat echo on 03/24/2021 which showed an increase in the mean gradient across aortic valve to 54 mmHg with a valve area of 0.58 cm.  Left ventricular systolic function is normal.  She is here today with her husband.  She continues to work in an office.  Past Medical History:  Diagnosis Date   Abnormal liver function tests    Adenomatous colon polyp     CAD (coronary artery disease) 05/09/2018   a. LHC 05/09/18: DES to mid/dist LAD, mod AS   Cerebrovascular disease    a. CT 04/2016 -calcific plaque at the origin LEFT vertebral contributing to severe stenosis.   Diverticulitis of colon    Fatty liver    a. mild fatty liver infiltration by CT 2014.   Hx of renal calculi    LEFT   Hyperlipidemia    Hypertension    Hypothyroidism    pt states hx of thyroid nodules   IBS (irritable bowel syndrome)    Internal hemorrhoids    [place] [place]   mid upper stomach (05/08/2018)   OSA on CPAP     SETTING IS 14 (05/08/2018)   Statin intolerance    Stroke ([facility]    a. right thalamic infarct in 04/2016 felt by neuro to be due to small vessel disease.   Type II diabetes mellitus ([facility]     Past Surgical History:  Procedure Laterality Date   CARDIAC CATHETERIZATION     CATARACT EXTRACTION W/ INTRAOCULAR LENS  IMPLANT, BILATERAL Bilateral 08/2016   COLONOSCOPY W/ BIOPSIES AND POLYPECTOMY  05/19/2005   adenomatous polyps   CORONARY STENT INTERVENTION N/A 05/09/2018   Procedure: CORONARY STENT INTERVENTION;  Surgeon: ENelva Bush MD;  [facility];  Service: Cardiovascular;  Laterality: N/A;   LEFT HEART CATH AND CORONARY ANGIOGRAPHY N/A 05/09/2018   Procedure: LEFT HEART CATH AND CORONARY ANGIOGRAPHY;  Surgeon: Nelva Bush, MD;  Location: [facility];  Service: Cardiovascular;  Laterality: N/A;   MELANOMA EXCISION  1980s   mid upper stomach   NEPHROLITHOTOMY Left 01/27/2013   Procedure: NEPHROLITHOTOMY PERCUTANEOUS;  Surgeon: Dutch Gray, MD;  Location: [facility];  Service: Urology;  Laterality: Left;   RIGHT/LEFT HEART CATH AND CORONARY ANGIOGRAPHY N/A 05/04/2021   Procedure: RIGHT/LEFT HEART CATH AND CORONARY ANGIOGRAPHY;  Surgeon: Early Osmond, MD;  Location: [facility];  Service: Cardiovascular;  Laterality: N/A;   TUBAL LIGATION     WISDOM TEETH EXTRACTED      Family History  Problem  Relation Age of Onset   Hyperlipidemia Mother    Neurofibromatosis Father    Thyroid disease Daughter    Hyperlipidemia Sister    Hyperlipidemia Brother    Diabetes Neg Hx    Stroke Neg Hx    Colon cancer Neg Hx    Esophageal cancer Neg Hx    Pancreatic cancer Neg Hx    Liver cancer Neg Hx     Social History   Socioeconomic History   Marital status: Married    Spouse name: Not on file   Number of children: 3   Years of education: 13   Highest education level: High school graduate  Occupational History   Occupation: Producer, 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 She does office work. One caffeinated drink daily.   Updated as of 04/30/2013   Social Determinants of Health   Financial Resource Strain: Not on file  Food Insecurity: Not on file  Transportation Needs: Not on file  Physical Activity: Not on file  Stress: Not on file  Social Connections: Not on file  Intimate Partner Violence: Not on file    Prior to Admission medications   Medication Sig Start Date End Date Taking? Authorizing Provider  amLODipine (NORVASC) 10 MG tablet TAKE 1/2 TABLET BY MOUTH EVERY DAY 04/20/21  Yes Jerline Pain, MD  aspirin EC 81 MG tablet Take 1 tablet (81 mg total) by mouth daily. 05/11/16  Yes Dessa Phi, DO  Blood Glucose Monitoring Suppl (ONETOUCH VERIO) w/Device KIT by Does not apply route. Use to check blood sugars once daily.   Yes [provider]  carvedilol (COREG) 6.25 MG tablet TAKE 1 TABLET BY MOUTH TWICE A DAY 04/20/21  Yes Jerline Pain, MD  glimepiride (AMARYL) 1 MG tablet TAKE 1 TABLET BY MOUTH EVERYDAY AT BEDTIME 03/07/21  Yes Elayne Snare, MD  glucose blood (ONE TOUCH ULTRA TEST) test strip USE TO CHECK BLOOD SUGAR TWICE DAILY  Dx:E11.65 10/22/17  Yes Elayne Snare, MD  Lancets (ONETOUCH DELICA PLUS [id number]) MISC Use to test blood sugars once daily. 01/01/20  Yes Elayne Snare, MD  levothyroxine (SYNTHROID) 125 MCG tablet 1 tab daily except 1/2 on Sundays Patient taking differently: Take 125 mcg by mouth See admin instructions. Mon - Sat Only 06/07/21  Yes Elayne Snare, MD  Magnesium 500 MG TABS Take 500 mg by mouth 2 (two) times daily.   Yes [provider]  metFORMIN (GLUCOPHAGE) 1000 MG tablet TAKE 1 TABLET BY MOUTH TWICE A DAY 04/20/21  Yes Elayne Snare, MD  nitroGLYCERIN (NITROSTAT) 0.4 MG SL tablet Place 1 tablet (0.4 mg total) under the tongue every 5 (five) minutes x 3 doses as needed for chest pain. 05/10/18  Yes Daune Perch, NP  OneTouch Delica Lancets 71G MISC by Does not apply route. Use to test blood sugars once daily   Yes [provider]  ramipril (ALTACE) 10 MG capsule TAKE 1 CAPSULE BY MOUTH EVERY DAY 04/11/21  Yes Jerline Pain, MD  rosuvastatin (CRESTOR) 5 MG tablet TAKE 1/2 TABLET BY MOUTH DAILY Patient taking differently: Take 1.25 mg by mouth daily. 06/04/20  Yes Jerline Pain, MD    Current Outpatient Medications  Medication Sig Dispense Refill   amLODipine (NORVASC) 10 MG tablet TAKE 1/2 TABLET BY MOUTH EVERY DAY 45 tablet 3   aspirin EC 81 MG tablet Take 1 tablet (81 mg total) by mouth daily. 30 tablet 0   Blood Glucose Monitoring Suppl (ONETOUCH VERIO) w/Device KIT by Does not apply route. Use to check blood sugars once daily.     carvedilol (COREG) 6.25 MG tablet TAKE 1 TABLET BY MOUTH TWICE A DAY 180 tablet 3   glimepiride (AMARYL) 1 MG tablet TAKE 1 TABLET BY MOUTH EVERYDAY AT BEDTIME 90 tablet 1   glucose blood (ONE TOUCH ULTRA TEST) test strip USE TO CHECK BLOOD SUGAR TWICE DAILY  Dx:E11.65 100 each 3   Lancets (ONETOUCH DELICA PLUS [id number]) MISC Use to test blood sugars  once daily. 100 each 0   levothyroxine (SYNTHROID) 125 MCG tablet 1 tab daily except 1/2 on Sundays (Patient taking differently: Take 125 mcg by mouth See admin instructions. Mon - Sat Only) 90 tablet 1   Magnesium 500 MG TABS Take 500 mg by mouth 2 (two) times daily.     metFORMIN (GLUCOPHAGE) 1000 MG tablet TAKE 1 TABLET BY MOUTH TWICE A DAY 180 tablet 1   nitroGLYCERIN (NITROSTAT) 0.4 MG SL tablet Place 1 tablet (0.4 mg total) under the tongue every 5 (five) minutes x 3 doses as needed for chest pain. 25 tablet 12   OneTouch Delica Lancets 27O MISC by Does not apply route. Use to test blood sugars once daily     ramipril (ALTACE) 10 MG capsule TAKE 1 CAPSULE BY MOUTH EVERY DAY 90 capsule 3   rosuvastatin (CRESTOR) 5 MG tablet TAKE 1/2 TABLET BY MOUTH DAILY (Patient taking differently: Take 1.25 mg by mouth daily.) 45 tablet 2   Current Facility-Administered Medications  Medication Dose Route Frequency Provider Last Rate Last Admin   sodium chloride flush (NS) 0.9 % injection 3 mL  3 mL Intravenous Q12H Early Osmond, MD        Allergies  Allergen Reactions   Crestor [Rosuvastatin]     REACTION: N/V and heartburn, can very small dose    Lipitor [Atorvastatin]     REACTION: Hot flashes and flu-like symptoms   Pravastatin Sodium     REACTION: Neck swelling and pain in her shoulders and arms.   Sulfonamide Derivatives     Rxn Unknown   Zetia [Ezetimibe]     GI upset   Zocor [Simvastatin] Other (See Comments)    GI issues.. Pt unknown of severity   Niacin Nausea And Vomiting  Review of Systems:   General:  normal appetite, + decreased energy, no weight gain, + weight loss, no fever  Cardiac:  no chest pain with exertion, no chest pain at rest, +SOB with moderate exertion, no resting SOB, no PND, no orthopnea, no palpitations, no arrhythmia, no atrial fibrillation, + LE edema, + dizzy spells, no syncope  Respiratory:  + exertional shortness of breath, no home oxygen, no  productive cough, no dry cough, no bronchitis, no wheezing, no hemoptysis, no asthma, no pain with inspiration or cough, + sleep apnea, + CPAP at night  GI:   no difficulty swallowing, no reflux, no frequent heartburn, no hiatal hernia, no abdominal pain, + constipation, no diarrhea, no hematochezia, no hematemesis, no melena  GU:   no dysuria,  no frequency, no urinary tract infection, no hematuria, no kidney stones, no kidney disease  Vascular:  no pain suggestive of claudication, no pain in feet, no leg cramps, no varicose veins, no DVT, no non-healing foot ulcer  Neuro:   + stroke, no TIA's, no seizures, no headaches, no temporary blindness one eye,  no slurred speech, + peripheral neuropathy in hands, no chronic pain, no instability of gait, no memory/cognitive dysfunction  Musculoskeletal: no arthritis , no joint swelling, no myalgias, no difficulty walking, normal mobility   Skin:   no rash, no itching, no skin infections, no pressure sores or ulcerations  Psych:   no anxiety, no depression, no nervousness, no unusual recent stress  Eyes:   no blurry vision, no floaters, no recent vision changes, + wears glasses  ENT:   no hearing loss, no loose or painful teeth, no dentures, last saw dentist last year  Hematologic:  no easy bruising, no abnormal bleeding, no clotting disorder, no frequent epistaxis  Endocrine:  + diabetes, does check CBG's at home     Physical Exam:   BP 129/71 (BP Location: Right Arm, Patient Position: Sitting)    Pulse 65    Resp 20    Ht _0  (1.6 m)    SpO2 99% Comment: RA   BMI 27.56 kg/m   General:  well-appearing  HEENT:  Unremarkable, NCAT, PERLA, EOMI,   Neck:   no JVD, no bruits, no adenopathy   Chest:   clear to auscultation, symmetrical breath sounds, no wheezes, no rhonchi   CV:   RRR, 3/6 systolic murmur RSB, no diastolic murmur  Abdomen:  soft, non-tender, no masses   Extremities:  warm, well-perfused, pulses palpable in ankles , no lower extremity  edema  Rectal/GU  Deferred  Neuro:   Grossly non-focal and symmetrical throughout  Skin:   Clean and dry, no rashes, no breakdown  Diagnostic Tests:  ECHOCARDIOGRAM REPORT         Patient Name:   ABRIE EGLOFF  Date of Exam: 03/24/2021  Medical Rec #:  [id number]     Height:       63.5 in  Accession #:    [id number]    Weight:       157.2 lb  Date of Birth:  1951-04-17      BSA:          1.756 m  Patient Age:    14 years      BP:           142/82 mmHg  Patient Gender: F             HR:           65 bpm.  Exam Location:  [facility]   Procedure: 2D Echo, Cardiac Doppler, Color Doppler and Strain Analysis   Indications:    I35.0 Aortic Stenosis     History:        Patient has prior history of Echocardiogram examinations,  most                  recent 09/07/2020. CAD; Risk Factors:Hypertension, Diabetes  and                  HLD.     Sonographer:    Marygrace Drought RCS  Referring Phys: [id number] [address]     1. Left ventricular ejection fraction, by estimation, is 60 to 65%. The  left ventricle has normal function. The left ventricle has no regional  wall motion abnormalities. There is mild concentric left ventricular  hypertrophy. Left ventricular diastolic  parameters are consistent with Grade I diastolic dysfunction (impaired  relaxation). Elevated left ventricular end-diastolic pressure. The average  left ventricular global longitudinal strain is -19.8 %. The global  longitudinal strain is normal.   2. Right ventricular systolic function is normal. The right ventricular  size is normal. There is normal pulmonary artery systolic pressure.   3. The mitral valve is normal in structure. Trivial mitral valve  regurgitation. No evidence of mitral stenosis. Moderate mitral annular  calcification.   4. The aortic valve is calcified. There is severe calcifcation of the  aortic valve. There is severe thickening of the aortic valve. Aortic valve  regurgitation is  trivial. Severe aortic valve stenosis.   5. The inferior vena cava is normal in size with greater than 50%  respiratory variability, suggesting right atrial pressure of 3 mmHg.   FINDINGS   Left Ventricle: Left ventricular ejection fraction, by estimation, is 60  to 65%. The left ventricle has normal function. The left ventricle has no  regional wall motion abnormalities. The average left ventricular global  longitudinal strain is -19.8 %.  The global longitudinal strain is normal. The left ventricular internal  cavity size was normal in size. There is mild concentric left ventricular  hypertrophy. Left ventricular diastolic parameters are consistent with  Grade I diastolic dysfunction  (impaired relaxation). Elevated left ventricular end-diastolic pressure.   Right Ventricle: The right ventricular size is normal. No increase in  right ventricular wall thickness. Right ventricular systolic function is  normal. There is normal pulmonary artery systolic pressure. The tricuspid  regurgitant velocity is 2.02 m/s, and   with an assumed right atrial pressure of 3 mmHg, the estimated right  ventricular systolic pressure is 76.8 mmHg.   Left Atrium: Left atrial size was normal in size.   Right Atrium: Right atrial size was normal in size.   Pericardium: There is no evidence of pericardial effusion.   Mitral Valve: The mitral valve is normal in structure. There is mild  thickening of the mitral valve leaflet(s). Moderate mitral annular  calcification. Trivial mitral valve regurgitation. No evidence of mitral  valve stenosis.   Tricuspid Valve: The tricuspid valve is normal in structure. Tricuspid  valve regurgitation is trivial. No evidence of tricuspid stenosis.   Aortic Valve: The aortic valve is calcified. There is severe calcifcation  of the aortic valve. There is severe thickening of the aortic valve.  Aortic valve regurgitation is trivial. Aortic regurgitation PHT measures  522  msec. Severe aortic stenosis is  present. Aortic valve mean gradient measures 54.0 mmHg. Aortic valve peak  gradient  measures 81.4 mmHg. Aortic valve area, by VTI measures 0.58 cm.   Pulmonic Valve: The pulmonic valve was normal in structure. Pulmonic valve  regurgitation is trivial. No evidence of pulmonic stenosis.   Aorta: The aortic root is normal in size and structure.   Venous: The inferior vena cava is normal in size with greater than 50%  respiratory variability, suggesting right atrial pressure of 3 mmHg.   IAS/Shunts: No atrial level shunt detected by color flow Doppler.      LEFT VENTRICLE  PLAX 2D  LVIDd:         4.20 cm   Diastology  LVIDs:         2.80 cm   LV e' medial:    5.77 cm/s  LV PW:         1.10 cm   LV E/e' medial:  19.2  LV IVS:        1.20 cm   LV e' lateral:   6.53 cm/s  LVOT diam:     1.80 cm   LV E/e' lateral: 17.0  LV SV:         69  LV SV Index:   40        2D Longitudinal Strain  LVOT Area:     2.54 cm  2D Strain GLS (A2C):   -20.4 %                           2D Strain GLS (A3C):   -19.0 %                           2D Strain GLS (A4C):   -20.4 %                           2D Strain GLS Avg:     -19.8 %   RIGHT VENTRICLE  RV Basal diam:  3.40 cm  RV S prime:     16.90 cm/s  TAPSE (M-mode): 2.1 cm  RVSP:           19.3 mmHg   LEFT ATRIUM             Index        RIGHT ATRIUM           Index  LA diam:        4.40 cm 2.51 cm/m   RA Pressure: 3.00 mmHg  LA Vol (A2C):   48.9 ml 27.85 ml/m  RA Area:     13.90 cm  LA Vol (A4C):   27.6 ml 15.72 ml/m  RA Volume:   31.80 ml  18.11 ml/m  LA Biplane Vol: 38.8 ml 22.10 ml/m   AORTIC VALVE  AV Area (Vmax):    0.64 cm  AV Area (Vmean):   0.58 cm  AV Area (VTI):     0.58 cm  AV Vmax:           451.00 cm/s  AV Vmean:          351.000 cm/s  AV VTI:            1.190 m  AV Peak Grad:      81.4 mmHg  AV Mean Grad:      54.0 mmHg  LVOT Vmax:         113.00 cm/s  LVOT Vmean:  79.700 cm/s   LVOT VTI:          0.273 m  LVOT/AV VTI ratio: 0.23  AI PHT:            522 msec     AORTA  Ao Root diam: 2.70 cm  Ao Asc diam:  3.10 cm   MITRAL VALVE                TRICUSPID VALVE  MV Area (PHT):              TR Peak grad:   16.3 mmHg  MV Decel Time:              TR Vmax:        202.00 cm/s  MR Peak grad: 80.3 mmHg     Estimated RAP:  3.00 mmHg  MR Mean grad: 59.0 mmHg     RVSP:           19.3 mmHg  MR Vmax:      448.00 cm/s  MR Vmean:     367.0 cm/s    SHUNTS  MV E velocity: 111.00 cm/s  Systemic VTI:  0.27 m  MV A velocity: 133.00 cm/s  Systemic Diam: 1.80 cm  MV E/A ratio:  0.83   Skeet Latch MD  Electronically signed by Skeet Latch MD  Signature Date/Time: 03/24/2021/3:00:03 PM         Final     Physicians  Panel Physicians Referring Physician Case Authorizing Physician  Early Osmond, MD (Primary)     Procedures  RIGHT/LEFT HEART CATH AND CORONARY ANGIOGRAPHY   Conclusion      Prox LAD lesion is 40% stenosed.   Ost RCA to Prox RCA lesion is 30% stenosed.   Ost 3rd Mrg lesion is 30% stenosed.   Ost 2nd Diag lesion is 70% stenosed.   Non-stenotic Mid LAD lesion was previously treated.   1.  Normal filling pressures, cardiac output, cardiac index 2.  Patent distal LAD stent with one-vessel ostial second diagonal disease with mild to moderate disease elsewhere; medical therapy will be pursued.   Indications  Nonrheumatic aortic valve stenosis [I35.0 (ICD-10-CM)]   Procedural Details  Technical Details The patient is a 71 year old female with a history of coronary artery disease status post PCI of the distal LAD in 2019, hypertension, hyperlipidemia, type 2 diabetes, history of stroke, and severe symptomatic aortic stenosis was evaluated in the outpatient setting.  She is referred for preprocedural assessment consisting of right heart catheterization and coronary angiography study.  After obtaining consent the patient was brought to the  cardiac catheterization laboratory prepped draped sterile fashion ultrasound was used to gain access to the right radial artery and a 6 French glide sheath placed there.  5 mg of verapamil and 5000 units of heparin were administered.  Previous placed antecubital IV was exchanged for a 5 French femoral glide sheath.  Right heart catheterization and coronary angiography study was then performed .  At the conclusion of the procedure manual pressure was applied to the antecubital site and a TR band was placed on the radial site.  There were no acute complications.   Estimated blood loss <50 mL.   During this procedure medications were administered to achieve and maintain moderate conscious sedation while the patient's heart rate, blood pressure, and oxygen saturation were continuously monitored and I was present face-to-face 100% of this time.   Medications (Filter: Administrations occurring from [phone] to 0928 on 05/04/21)  important  Continuous medications are totaled by the amount administered until 05/04/21 0928.   Heparin (Porcine) in NaCl 1000-0.9 UT/500ML-% SOLN (mL) Total volume:  1,000 mL Date/Time Rate/Dose/Volume Action   05/04/21 0840 500 mL Given   0840 500 mL Given    midazolam (VERSED) injection (mg) Total dose:  1 mg Date/Time Rate/Dose/Volume Action   05/04/21 0851 1 mg Given    fentaNYL (SUBLIMAZE) injection (mcg) Total dose:  25 mcg Date/Time Rate/Dose/Volume Action   05/04/21 0851 25 mcg Given    lidocaine (PF) (XYLOCAINE) 1 % injection (mL) Total volume:  4 mL Date/Time Rate/Dose/Volume Action   05/04/21 0856 2 mL Given   0857 2 mL Given    Radial Cocktail/Verapamil only Total dose:  Cannot be calculated* *Administration dose not documented Date/Time Rate/Dose/Volume Action   05/04/21 0859  Given    heparin sodium (porcine) injection (Units) Total dose:  5,000 Units Date/Time Rate/Dose/Volume Action   05/04/21 0900 5,000 Units Given    iohexol (OMNIPAQUE)  350 MG/ML injection (mL) Total volume:  65 mL Date/Time Rate/Dose/Volume Action   05/04/21 0927 65 mL Given    Sedation Time  Sedation Time Physician-1: 34 minutes 34 seconds Contrast  Medication Name Total Dose  iohexol (OMNIPAQUE) 350 MG/ML injection 65 mL   Radiation/Fluoro  Fluoro time: 6.1 (min) DAP: 11.8 (Gycm2) Cumulative Air Kerma: 643.3 (mGy) Complications  Complications documented before study signed (05/04/2021  9:47 AM)   RIGHT/LEFT HEART CATH AND CORONARY ANGIOGRAPHY  None Documented by Early Osmond, MD 05/04/2021  9:43 AM  Date Found: 05/04/2021  Time Range: Intraprocedure       Coronary Findings  Diagnostic Dominance: Right Left Main  Vessel is large. Vessel is angiographically normal.    Left Anterior Descending  Vessel is large.  Prox LAD lesion is 40% stenosed.  Non-stenotic Mid LAD lesion was previously treated. Vessel is the culprit lesion.    First Diagonal Branch  Vessel is small in size.    Second Diagonal Branch  Vessel is moderate in size.  Ost 2nd Diag lesion is 70% stenosed.    Left Circumflex  Vessel is large.    First Obtuse Marginal Branch  Vessel is small in size.    Second Obtuse Marginal Branch  Vessel is moderate in size.    Third Obtuse Marginal Branch  Vessel is moderate in size.  Ost 3rd Mrg lesion is 30% stenosed.    Right Coronary Artery  Vessel is large. Anterior take-off of the RCA.  Ost RCA to Prox RCA lesion is 30% stenosed.    Right Posterior Descending Artery  Vessel is moderate in size.    Right Posterior Atrioventricular Artery  Vessel is moderate in size.    Intervention   No interventions have been documented.   Coronary Diagrams  Diagnostic Dominance: Right Intervention  Implants     No implant documentation for this case.   [employer] Images   Show images for CARDIAC CATHETERIZATION Images on Long Term Storage   Show images for [place], [place] to Procedure Log  Procedure  Log    Hemo Data  Flowsheet Row Most Recent Value  Fick Cardiac Output 6.7 L/min  Fick Cardiac Output Index 3.87 (L/min)/BSA  RA A Wave 5 mmHg  RA V Wave 3 mmHg  RA Mean 2 mmHg  RV Systolic Pressure 29 mmHg  RV Diastolic Pressure 0 mmHg  RV EDP 5 mmHg  PA Systolic Pressure 28 mmHg  PA Diastolic Pressure 9 mmHg  PA Mean  17 mmHg  PW A Wave 11 mmHg  PW V Wave 9 mmHg  PW Mean 7 mmHg  AO Systolic Pressure 937 mmHg  AO Diastolic Pressure 62 mmHg  AO Mean 88 mmHg  QP/QS 0.63  TPVR Index 4.39 HRUI  TSVR Index 22.73 HRUI  PVR SVR Ratio 0.12  TPVR/TSVR Ratio 0.19    ADDENDUM REPORT: 05/12/2021 14:28   EXAM: OVER-READ INTERPRETATION  CT CHEST   The following report is an over-read performed by radiologist Dr. Samara Snide [facility] on 05/12/2021. This over-read does not include interpretation of cardiac or coronary anatomy or pathology. The coronary CTA interpretation by the cardiologist is attached.   COMPARISON:  05/03/2018 coronary CT.   FINDINGS: Please see the separate concurrent chest CT angiogram report for details.   IMPRESSION: Please see the separate concurrent chest CT angiogram report for details.     Electronically Signed   By: Ilona Sorrel M.D.   On: 05/12/2021 14:28    Addended by Sharyn Blitz, MD on 05/12/2021  2:31 PM   Study Result  Narrative & Impression  CLINICAL DATA:  [phone] with severe aortic stenosis being evaluated for a TAVR procedure.   EXAM: Cardiac TAVR CT   TECHNIQUE: The patient was scanned on a [employer]. A 120 kV retrospective scan was triggered in the descending thoracic aorta at 111 HU's. Gantry rotation speed was 250 msecs and collimation was .6 mm. No beta blockade or nitro were given. The 3D data set was reconstructed in 5% intervals of the R-R cycle. Systolic and diastolic phases were analyzed on a dedicated work station using MPR, MIP and VRT modes. The patient received 80 cc of  contrast.   FINDINGS: Aortic Root:   Aortic valve: Tricuspid   Aortic valve calcium score: 2538   Aortic annulus:   Diameter: 52m x 156m  Perimeter: 7037m Area: 371 mm^2   Calcifications: No calcifications   Coronary height: Min Left - 60m29max Left - 21mm62mn Right - 16mm 48mnotubular height: Left cusp - 23mm; 63mt cusp - 18mm; N104mronary cusp - 21mm   L47m(as measured 3 mm below the annulus):   Diameter: 26mm x 1822m Are39m45 mm^2   Calcifications: No calcifications   Aortic sinus width: Left cusp - 28mm; Right66mp - 28mm; Noncor31my cusp - 29mm   Sinotu41mr junction width: 30mm x 27mm   8mmum 76mroscopic Angle for Delivery: LAO 24 CAU 1   Cardiac:   Right atrium: Mild enlargement   Right ventricle: Mild enlargement   Pulmonary arteries: Normal size   Pulmonary veins: Normal configuration   Left atrium: Mild enlargement   Left ventricle: Normal size   Pericardium: Normal thickness   Coronary arteries: Distal LAD stent   IMPRESSION: 1. Trileaflet aortic valve with severe calcifications (AV calcium score 2538)   2. Aortic annulus measures 25mm x 19mm in d94mter 91m perimeter 70mm and area 371 65m. No annular or LVOT calcifications. Annular measurements are suitable for delivery of 23mm Edwards Sapien70malve   3. Sufficient coronary to annulus distance, measuring 60mm to left main an104mmm to RCA   4. Opti4mFluoroscopic Angle for Delivery:  LAO 24 CAU 1   Electronically Signed: By: Christopher  Schumann Oswaldo Milian4:16    Narrative & Impression  CLINICAL DATA:  Aortic valve replacement (TAVR), pre-op eval. Aortic valve stenosis, severe.   EXAM: CT ANGIOGRAPHY CHEST, ABDOMEN AND PELVIS  TECHNIQUE: Multidetector CT imaging through the chest, abdomen and pelvis was performed using the standard protocol during bolus administration of intravenous contrast. Multiplanar reconstructed images and MIPs  were obtained and reviewed to evaluate the vascular anatomy.   CONTRAST:  189m OMNIPAQUE IOHEXOL 350 MG/ML SOLN   COMPARISON:  05/03/2018 coronary CT.  04/22/2013 CT abdomen/pelvis.   FINDINGS: CTA CHEST FINDINGS   Cardiovascular: Borderline mild cardiomegaly. Diffuse thickening and coarse calcification of the aortic valve. No significant pericardial effusion/thickening. Left anterior descending and left circumflex coronary atherosclerosis. Atherosclerotic nonaneurysmal thoracic aorta. Normal caliber pulmonary arteries. No central pulmonary emboli.   Mediastinum/Nodes: No discrete thyroid nodules. Unremarkable esophagus. No pathologically enlarged axillary, mediastinal or hilar lymph nodes.   Lungs/Pleura: No pneumothorax. No pleural effusion. No acute consolidative airspace disease, lung masses or significant pulmonary nodules.   Musculoskeletal: No aggressive appearing focal osseous lesions. Mild thoracic spondylosis.   CTA ABDOMEN AND PELVIS FINDINGS   Hepatobiliary: Normal liver with no liver mass. Normal gallbladder with no radiopaque cholelithiasis. No biliary ductal dilatation.   Pancreas: Normal, with no mass or duct dilation.   Spleen: Normal size. No mass.   Adrenals/Urinary Tract: Mildly lobulated adrenal glands without discrete adrenal nodules. No contour deforming renal masses. No hydronephrosis. Nonobstructing 8 mm lower left renal stone. Normal bladder.   Stomach/Bowel: Normal non-distended stomach. Normal caliber small bowel with no small bowel wall thickening. Normal appendix. Mild left colonic diverticulosis, with no large bowel wall thickening or significant pericolonic fat stranding.   Vascular/Lymphatic: Atherosclerotic nonaneurysmal abdominal aorta. No pathologically enlarged lymph nodes in the abdomen or pelvis.   Reproductive: Grossly normal uterus.  No adnexal mass.   Other: No pneumoperitoneum, ascites or focal fluid collection.    Musculoskeletal: No aggressive appearing focal osseous lesions. Marked degenerative disc disease at L5-S1.   VASCULAR MEASUREMENTS PERTINENT TO TAVR:   AORTA:   Minimal Aortic Diameter-12.4 x 11.5 mm   Severity of Aortic Calcification-moderate   RIGHT PELVIS:   Right Common Iliac Artery -   Minimal Diameter-8.7 x 7.6 mm   Tortuosity-mild   Calcification-moderate   Right External Iliac Artery -   Minimal Diameter-7.2 x 6.5 mm   Tortuosity-mild-to-moderate   Calcification-mild   Right Common Femoral Artery -   Minimal Diameter-7.6 x 7.0 mm   Tortuosity-mild   Calcification-mild-to-moderate   LEFT PELVIS:   Left Common Iliac Artery -   Minimal Diameter-9.3 x 8.2 mm   Tortuosity-mild-to-moderate   Calcification-mild-to-moderate   Left External Iliac Artery -   Minimal Diameter-7.3 x 7.0 mm   Tortuosity-moderate   Calcification-mild   Left Common Femoral Artery -   Minimal Diameter-7.4 x 6.4 mm   Tortuosity-mild   Calcification-mild-to-moderate   Review of the MIP images confirms the above findings.   IMPRESSION: 1. Vascular findings and measurements pertinent to potential TAVR procedure, as detailed. 2. Diffuse thickening and coarse calcification of the aortic valve, compatible with the reported clinical history of severe aortic stenosis. 3. Two-vessel coronary atherosclerosis. 4. Mild left colonic diverticulosis. 5. Nonobstructing left nephrolithiasis. 6. Aortic Atherosclerosis (ICD10-I70.0).     Electronically Signed   By: JIlona SorrelM.D.   On: 05/12/2021 14:24     Impression:  This 71year old woman has stage D, severe, symptomatic aortic stenosis with New York Heart Association class II symptoms of exertional fatigue and shortness of breath consistent with chronic diastolic congestive heart failure.  She has recently started having some episodes of dizziness as well as swelling in her ankles.  I have  personally reviewed her 2D  echocardiogram, cardiac catheterization, and CTA studies.  Her echo from November 2022 showed a severely calcified and thickened aortic valve with restricted leaflet mobility.  The mean gradient was 54 mmHg with a peak gradient of 81.4 mmHg and a valve area of 0.58 cm consistent with critical aortic stenosis.  There is trivial aortic insufficiency.  Left ventricular ejection fraction was 60 to 65% with grade 1 diastolic dysfunction.  Cardiac catheterization showed a patent stent in the mid LAD, a 70% ostial second diagonal stenosis, and otherwise mild to moderate nonobstructive coronary disease which can be treated medically.  I agree that aortic valve replacement is indicated in this patient for relief of her symptoms and to prevent progressive left ventricular deterioration.  She is 82 but has multiple comorbidities that may increase her risk with open surgical aortic valve replacement.  Therefore I think transcatheter aortic valve replacement is a reasonable alternative.  Her gated cardiac CTA shows anatomy suitable for TAVR using a SAPIEN 3 valve.  Her abdominal and pelvic CTA shows adequate pelvic vascular anatomy to allow transfemoral insertion.  The patient and her husband were counseled at length regarding treatment alternatives for management of severe symptomatic aortic stenosis. The risks and benefits of surgical intervention has been discussed in detail. Long-term prognosis with medical therapy was discussed. Alternative approaches such as conventional surgical aortic valve replacement, transcatheter aortic valve replacement, and palliative medical therapy were compared and contrasted at length. This discussion was placed in the context of the patient's own specific clinical presentation and past medical history. All of their questions have been addressed.   Following the decision to proceed with transcatheter aortic valve replacement, a discussion was held regarding what types of management  strategies would be attempted intraoperatively in the event of life-threatening complications, including whether or not the patient would be considered a candidate for the use of cardiopulmonary bypass and/or conversion to open sternotomy for attempted surgical intervention.  She is certainly a candidate for emergent sternotomy to manage any intraoperative complications.  The patient is aware of the fact that transient use of cardiopulmonary bypass may be necessary. The patient has been advised of a variety of complications that might develop including but not limited to risks of death, stroke, paravalvular leak, aortic dissection or other major vascular complications, aortic annulus rupture, device embolization, cardiac rupture or perforation, mitral regurgitation, acute myocardial infarction, arrhythmia, heart block or bradycardia requiring permanent pacemaker placement, congestive heart failure, respiratory failure, renal failure, pneumonia, infection, other late complications related to structural valve deterioration or migration, or other complications that might ultimately cause a temporary or permanent loss of functional independence or other long term morbidity. The patient provides full informed consent for the procedure as described and all questions were answered.      Plan:  She will be scheduled for transfemoral TAVR using a SAPIEN 3 valve on 06/21/2021.  I spent 60 minutes performing this consultation and > 50% of this time was spent face to face counseling and coordinating the care of this patient's severe, symptomatic aortic stenosis.   Gaye Pollack, MD 06/15/2021

## 2021-06-17 ENCOUNTER — Other Ambulatory Visit (HOSPITAL_COMMUNITY): Payer: Medicare Other

## 2021-06-17 NOTE — Pre-Procedure Instructions (Signed)
Surgical Instructions    Your procedure is scheduled on Tuesday, January 31st.  Report to [facility] Main Entrance "A" at 11:45 A.M., then check in with the Admitting office.  Call this number if you have problems the morning of surgery:  [phone]   If you have any questions prior to your surgery date call [phone]: Open Monday-Friday 8am-4pm    Remember:  Do not eat or drink after midnight the night before your surgery     Take these medicines the morning of surgery with A SIP OF WATER :  levothyroxine (SYNTHROID)  STOP now taking any Aleve, Naproxen, Ibuprofen, Motrin, Advil, Goody's, BC's, all herbal medications, fish oil, and all non-prescription vitamins.    Please follow instructions from pre-admission testing in regards to your diabetic medication regimen prior to surgery.   Stop taking Metformin on 1/29 (Sunday). You will take your last dose on 1/28 (Saturday). Continue taking all other medications including Aspirin without change through the day before surgery. On the morning of surgery take only Synthroid with a sip of water.   WHAT DO I DO ABOUT MY DIABETES MEDICATION?   Do not take metFORMIN (GLUCOPHAGE) or  glimepiride (AMARYL) the morning of surgery.  THE NIGHT BEFORE SURGERY (1/30) do not take glimepiride (AMARYL).       HOW TO MANAGE YOUR DIABETES BEFORE AND AFTER SURGERY  Why is it important to control my blood sugar before and after surgery? Improving blood sugar levels before and after surgery helps healing and can limit problems. A way of improving blood sugar control is eating a healthy diet by:  Eating less sugar and carbohydrates  Increasing activity/exercise  Talking with your doctor about reaching your blood sugar goals High blood sugars (greater than 180 mg/dL) can raise your risk of infections and slow your recovery, so you will need to focus on controlling your diabetes during the weeks before surgery. Make sure that the doctor who  takes care of your diabetes knows about your planned surgery including the date and location.  How do I manage my blood sugar before surgery? Check your blood sugar at least 4 times a day, starting 2 days before surgery, to make sure that the level is not too high or low.  Check your blood sugar the morning of your surgery when you wake up and every 2 hours until you get to the Short Stay unit.  If your blood sugar is less than 70 mg/dL, you will need to treat for low blood sugar: Do not take insulin. Treat a low blood sugar (less than 70 mg/dL) with  cup of clear juice (cranberry or apple), 4 glucose tablets, OR glucose gel. Recheck blood sugar in 15 minutes after treatment (to make sure it is greater than 70 mg/dL). If your blood sugar is not greater than 70 mg/dL on recheck, call [phone] for further instructions. Report your blood sugar to the short stay nurse when you get to Short Stay.  If you are admitted to the hospital after surgery: Your blood sugar will be checked by the staff and you will probably be given insulin after surgery (instead of oral diabetes medicines) to make sure you have good blood sugar levels. The goal for blood sugar control after surgery is 80-180 mg/dL.                      Do NOT Smoke (Tobacco/Vaping) for 24 hours prior to your procedure.  If you use a CPAP at  night, you may bring your mask/headgear for your overnight stay.   Contacts, glasses, piercing's, hearing aid's, dentures or partials may not be worn into surgery, please bring cases for these belongings.    For patients admitted to the hospital, discharge time will be determined by your treatment team.   Patients discharged the day of surgery will not be allowed to drive home, and someone needs to stay with them for 24 hours.  NO VISITORS WILL BE ALLOWED IN PRE-OP WHERE PATIENTS ARE PREPPED FOR SURGERY.  ONLY 1 SUPPORT PERSON MAY BE PRESENT IN THE WAITING ROOM WHILE YOU ARE IN SURGERY.  IF YOU  ARE TO BE ADMITTED, ONCE YOU ARE IN YOUR ROOM YOU WILL BE ALLOWED TWO (2) VISITORS. (1) VISITOR MAY STAY OVERNIGHT BUT MUST ARRIVE TO THE ROOM BY 8pm.  Minor children may have two parents present. Special consideration for safety and communication needs will be reviewed on a case by case basis.   Special instructions:   [facility]- Preparing For Surgery  Before surgery, you can play an important role. Because skin is not sterile, your skin needs to be as free of germs as possible. You can reduce the number of germs on your skin by washing with CHG (chlorahexidine gluconate) Soap before surgery.  CHG is an antiseptic cleaner which kills germs and bonds with the skin to continue killing germs even after washing.    Oral Hygiene is also important to reduce your risk of infection.  Remember - BRUSH YOUR TEETH THE MORNING OF SURGERY WITH YOUR REGULAR TOOTHPASTE  Please do not use if you have an allergy to CHG or antibacterial soaps. If your skin becomes reddened/irritated stop using the CHG.  Do not shave (including legs and underarms) for at least 48 hours prior to first CHG shower. It is OK to shave your face.  Please follow these instructions carefully.   Shower the NIGHT BEFORE SURGERY and the MORNING OF SURGERY  If you chose to wash your hair, wash your hair first as usual with your normal shampoo.  After you shampoo, rinse your hair and body thoroughly to remove the shampoo.  Use CHG Soap as you would any other liquid soap. You can apply CHG directly to the skin and wash gently with a scrungie or a clean washcloth.   Apply the CHG Soap to your body ONLY FROM THE NECK DOWN.  Do not use on open wounds or open sores. Avoid contact with your eyes, ears, mouth and genitals (private parts). Wash Face and genitals (private parts)  with your normal soap.   Wash thoroughly, paying special attention to the area where your surgery will be performed.  Thoroughly rinse your body with warm water from  the neck down.  DO NOT shower/wash with your normal soap after using and rinsing off the CHG Soap.  Pat yourself dry with a CLEAN TOWEL.  Wear CLEAN PAJAMAS to bed the night before surgery  Place CLEAN SHEETS on your bed the night before your surgery  DO NOT SLEEP WITH PETS.   Day of Surgery: Shower with CHG soap. Do not wear jewelry, make up, nail polish, gel polish, artificial nails, or any other type of covering on natural nails including finger and toenails. If patients have artificial nails, gel coating, etc. that need to be removed by a nail salon please have this removed prior to surgery. Surgery may need to be canceled/delayed if the surgeon/anesthesiologist feels like the patient is unable to be adequately monitored.  Do not wear lotions, powders, perfumes, or deodorant. Do not shave 48 hours prior to surgery.   Do not bring valuables to the hospital. [facility] is not responsible for any belongings or valuables. Wear Clean/Comfortable clothing the morning of surgery Remember to brush your teeth WITH YOUR REGULAR TOOTHPASTE.   Please read over the following fact sheets that you were given.   3 days prior to your procedure or After your COVID test   You are not required to quarantine however you are required to wear a well-fitting mask when you are out and around people not in your household. If your mask becomes wet or soiled, replace with a new one.   Wash your hands often with soap and water for 20 seconds or clean your hands with an alcohol-based hand sanitizer that contains at least 60% alcohol.   Do not share personal items.   Notify your provider:  o if you are in close contact with someone who has COVID  o or if you develop a fever of 100.4 or greater, sneezing, cough, sore throat, shortness of breath or body aches.

## 2021-06-20 ENCOUNTER — Encounter (HOSPITAL_COMMUNITY): Payer: Self-pay

## 2021-06-20 ENCOUNTER — Ambulatory Visit (HOSPITAL_COMMUNITY)
Admission: RE | Admit: 2021-06-20 | Discharge: 2021-06-20 | Disposition: A | Discharge status: Home / Self Care | Payer: Medicare Other | Source: Ambulatory Visit | Attending: Internal Medicine | Admitting: Internal Medicine

## 2021-06-20 ENCOUNTER — Encounter (HOSPITAL_COMMUNITY)
Admission: RE | Admit: 2021-06-20 | Discharge: 2021-06-20 | Disposition: A | Discharge status: Home / Self Care | Payer: Medicare Other | Source: Ambulatory Visit | Attending: Internal Medicine | Admitting: Internal Medicine

## 2021-06-20 ENCOUNTER — Other Ambulatory Visit: Payer: Self-pay

## 2021-06-20 VITALS — BP 135/70 | HR 64 | Temp 98.1°F | Resp 18 | Wt 151.5 lb

## 2021-06-20 DIAGNOSIS — I350 Nonrheumatic aortic (valve) stenosis: Secondary | ICD-10-CM

## 2021-06-20 DIAGNOSIS — Z85820 Personal history of malignant melanoma of skin: Secondary | ICD-10-CM | POA: Diagnosis not present

## 2021-06-20 DIAGNOSIS — Z8349 Family history of other endocrine, nutritional and metabolic diseases: Secondary | ICD-10-CM | POA: Diagnosis not present

## 2021-06-20 DIAGNOSIS — R42 Dizziness and giddiness: Secondary | ICD-10-CM | POA: Diagnosis not present

## 2021-06-20 DIAGNOSIS — Z888 Allergy status to other drugs, medicaments and biological substances status: Secondary | ICD-10-CM | POA: Diagnosis not present

## 2021-06-20 DIAGNOSIS — Z7984 Long term (current) use of oral hypoglycemic drugs: Secondary | ICD-10-CM | POA: Diagnosis not present

## 2021-06-20 DIAGNOSIS — E1165 Type 2 diabetes mellitus with hyperglycemia: Secondary | ICD-10-CM | POA: Diagnosis not present

## 2021-06-20 DIAGNOSIS — Z952 Presence of prosthetic heart valve: Secondary | ICD-10-CM | POA: Diagnosis not present

## 2021-06-20 DIAGNOSIS — Z87442 Personal history of urinary calculi: Secondary | ICD-10-CM | POA: Diagnosis not present

## 2021-06-20 DIAGNOSIS — Z01818 Encounter for other preprocedural examination: Secondary | ICD-10-CM | POA: Insufficient documentation

## 2021-06-20 DIAGNOSIS — Z954 Presence of other heart-valve replacement: Secondary | ICD-10-CM | POA: Diagnosis not present

## 2021-06-20 DIAGNOSIS — Z882 Allergy status to sulfonamides status: Secondary | ICD-10-CM | POA: Diagnosis not present

## 2021-06-20 DIAGNOSIS — E785 Hyperlipidemia, unspecified: Secondary | ICD-10-CM | POA: Diagnosis not present

## 2021-06-20 DIAGNOSIS — Z955 Presence of coronary angioplasty implant and graft: Secondary | ICD-10-CM | POA: Diagnosis not present

## 2021-06-20 DIAGNOSIS — G4733 Obstructive sleep apnea (adult) (pediatric): Secondary | ICD-10-CM | POA: Diagnosis not present

## 2021-06-20 DIAGNOSIS — H5500 Unspecified nystagmus: Secondary | ICD-10-CM | POA: Diagnosis not present

## 2021-06-20 DIAGNOSIS — I2510 Atherosclerotic heart disease of native coronary artery without angina pectoris: Secondary | ICD-10-CM | POA: Diagnosis not present

## 2021-06-20 DIAGNOSIS — H532 Diplopia: Secondary | ICD-10-CM | POA: Diagnosis not present

## 2021-06-20 DIAGNOSIS — Z86010 Personal history of colonic polyps: Secondary | ICD-10-CM | POA: Diagnosis not present

## 2021-06-20 DIAGNOSIS — U071 COVID-19: Secondary | ICD-10-CM | POA: Insufficient documentation

## 2021-06-20 DIAGNOSIS — Z006 Encounter for examination for normal comparison and control in clinical research program: Secondary | ICD-10-CM | POA: Diagnosis not present

## 2021-06-20 DIAGNOSIS — Z8673 Personal history of transient ischemic attack (TIA), and cerebral infarction without residual deficits: Secondary | ICD-10-CM | POA: Diagnosis not present

## 2021-06-20 DIAGNOSIS — Z7982 Long term (current) use of aspirin: Secondary | ICD-10-CM | POA: Diagnosis not present

## 2021-06-20 DIAGNOSIS — I6381 Other cerebral infarction due to occlusion or stenosis of small artery: Secondary | ICD-10-CM | POA: Diagnosis not present

## 2021-06-20 DIAGNOSIS — Z79899 Other long term (current) drug therapy: Secondary | ICD-10-CM | POA: Diagnosis not present

## 2021-06-20 DIAGNOSIS — E039 Hypothyroidism, unspecified: Secondary | ICD-10-CM | POA: Diagnosis not present

## 2021-06-20 DIAGNOSIS — Z79890 Hormone replacement therapy (postmenopausal): Secondary | ICD-10-CM | POA: Diagnosis not present

## 2021-06-20 DIAGNOSIS — I639 Cerebral infarction, unspecified: Secondary | ICD-10-CM | POA: Diagnosis not present

## 2021-06-20 DIAGNOSIS — I10 Essential (primary) hypertension: Secondary | ICD-10-CM | POA: Diagnosis not present

## 2021-06-20 LAB — URINALYSIS, ROUTINE W REFLEX MICROSCOPIC
Bilirubin Urine: NEGATIVE
Glucose, UA: NEGATIVE mg/dL
Ketones, ur: NEGATIVE mg/dL
Leukocytes,Ua: NEGATIVE
Nitrite: NEGATIVE
Protein, ur: NEGATIVE mg/dL
Specific Gravity, Urine: 1.025 (ref 1.005–1.030)
pH: 5.5 (ref 5.0–8.0)

## 2021-06-20 LAB — SURGICAL PCR SCREEN
MRSA, PCR: NEGATIVE
Staphylococcus aureus: POSITIVE — AB

## 2021-06-20 LAB — BLOOD GAS, ARTERIAL
Acid-Base Excess: 1.4 mmol/L (ref 0.0–2.0)
Bicarbonate: 25 mmol/L (ref 20.0–28.0)
Drawn by: 60286
FIO2: 21
O2 Saturation: 98.4 %
Patient temperature: 37
pCO2 arterial: 36.6 mmHg (ref 32.0–48.0)
pH, Arterial: 7.449 (ref 7.350–7.450)
pO2, Arterial: 104 mmHg (ref 83.0–108.0)

## 2021-06-20 LAB — GLUCOSE, CAPILLARY: Glucose-Capillary: 198 mg/dL — ABNORMAL HIGH (ref 70–99)

## 2021-06-20 LAB — COMPREHENSIVE METABOLIC PANEL
ALT: 30 U/L (ref 0–44)
AST: 22 U/L (ref 15–41)
Albumin: 4.3 g/dL (ref 3.5–5.0)
Alkaline Phosphatase: 68 U/L (ref 38–126)
Anion gap: 9 (ref 5–15)
BUN: 16 mg/dL (ref 8–23)
CO2: 25 mmol/L (ref 22–32)
Calcium: 10 mg/dL (ref 8.9–10.3)
Chloride: 104 mmol/L (ref 98–111)
Creatinine, Ser: 0.54 mg/dL (ref 0.44–1.00)
GFR, Estimated: >60 mL/min (ref >60)
Glucose, Bld: 156 mg/dL — ABNORMAL HIGH (ref 70–99)
Potassium: 4.2 mmol/L (ref 3.5–5.1)
Sodium: 138 mmol/L (ref 135–145)
Total Bilirubin: 0.7 mg/dL (ref 0.3–1.2)
Total Protein: 7.7 g/dL (ref 6.5–8.1)

## 2021-06-20 LAB — URINALYSIS, MICROSCOPIC (REFLEX)

## 2021-06-20 LAB — CBC
HCT: 40.2 % (ref 36.0–46.0)
Hemoglobin: 13.1 g/dL (ref 12.0–15.0)
MCH: 29 pg (ref 26.0–34.0)
MCHC: 32.6 g/dL (ref 30.0–36.0)
MCV: 88.9 fL (ref 80.0–100.0)
Platelets: 220 10*3/uL (ref 150–400)
RBC: 4.52 MIL/uL (ref 3.87–5.11)
RDW: 13.9 % (ref 11.5–15.5)
WBC: 6.8 10*3/uL (ref 4.0–10.5)
nRBC: 0 % (ref 0.0–0.2)

## 2021-06-20 LAB — TYPE AND SCREEN
ABO/RH(D): O POS
Antibody Screen: NEGATIVE

## 2021-06-20 LAB — BRAIN NATRIURETIC PEPTIDE: B Natriuretic Peptide: 109.1 pg/mL — ABNORMAL HIGH (ref 0.0–100.0)

## 2021-06-20 LAB — PROTIME-INR
INR: 0.9 (ref 0.8–1.2)
Prothrombin Time: 12.3 seconds (ref 11.4–15.2)

## 2021-06-20 LAB — SARS CORONAVIRUS 2 (TAT 6-24 HRS): SARS Coronavirus 2: POSITIVE — AB

## 2021-06-20 MED ORDER — CEFAZOLIN SODIUM-DEXTROSE 2-4 GM/100ML-% IV SOLN
2.0000 g | INTRAVENOUS | Status: AC
  Administered 2021-06-21: 2 g via INTRAVENOUS
  Filled 2021-06-20: qty 100

## 2021-06-20 MED ORDER — HEPARIN 30,000 UNITS/1000 ML (OHS) CELLSAVER SOLUTION
Status: DC
  Filled 2021-06-20: qty 1000

## 2021-06-20 MED ORDER — POTASSIUM CHLORIDE 2 MEQ/ML IV SOLN
80.0000 meq | INTRAVENOUS | Status: DC
  Filled 2021-06-20: qty 40

## 2021-06-20 MED ORDER — MAGNESIUM SULFATE 50 % IJ SOLN
40.0000 meq | INTRAMUSCULAR | Status: DC
  Filled 2021-06-20: qty 9.85

## 2021-06-20 MED ORDER — DEXMEDETOMIDINE HCL IN NACL 400 MCG/100ML IV SOLN
0.1000 ug/kg/h | INTRAVENOUS | Status: AC
  Administered 2021-06-21: 34.36 ug via INTRAVENOUS
  Administered 2021-06-21: 1 ug/kg/h via INTRAVENOUS
  Filled 2021-06-20: qty 100

## 2021-06-20 MED ORDER — NOREPINEPHRINE 4 MG/250ML-% IV SOLN
0.0000 ug/min | INTRAVENOUS | Status: AC
  Administered 2021-06-21: 1 ug/min via INTRAVENOUS
  Filled 2021-06-20: qty 250

## 2021-06-20 NOTE — H&P (Signed)
[address]       [place] [address]             [phone]      Cardiothoracic Surgery Admission History and Physical  PCP is Tonia Ghent, MD Referring Provider is Lenna Sciara, MD Primary Cardiologist is Candee Furbish, MD   Reason for admission:  Severe aortic stenosis   HPI:   The patient is a 71 year old woman with a history of hypertension, hyperlipidemia, hypothyroidism, type 2 diabetes, previous right thalamic stroke in 04/2016 felt to be due to small vessel disease without residual deficit, coronary artery disease status post DES to the mid LAD in 2019, and moderate to severe aortic stenosis.  A 2D echocardiogram in April 2022 showed a mean aortic valve gradient of 36.3 mmHg with a peak gradient of 65.5 mmHg.  Valve area was 0.63 cm.  Left ventricular ejection fraction is 65 to 70%.  She now presents with a 63-monthhistory of progressive exertional fatigue and shortness of breath.  She said she has not doing very much activity because she tires very quickly.  She has had some heaviness in her left shoulder and arm that feels like she can barely lift her arm.  This has been occurring more often but is not necessarily related to exertion.  It started after she had her stroke but has been occurring with more regularity.  She has had some lower extremity edema in her ankles.  She has had no orthopnea.  She recently started having some dizziness but no syncope.  She had a repeat echo on 03/24/2021 which showed an increase in the mean gradient across aortic valve to 54 mmHg with a valve area of 0.58 cm.  Left ventricular systolic function is normal.   She lives with her husband.  She continues to work in an office.       Past Medical History:  Diagnosis Date   Abnormal liver function tests     Adenomatous colon polyp     CAD (coronary artery disease) 05/09/2018    a. LHC 05/09/18: DES to mid/dist LAD, mod AS   Cerebrovascular disease      a. CT 04/2016  -calcific plaque at the origin LEFT vertebral contributing to severe stenosis.   Diverticulitis of colon     Fatty liver      a. mild fatty liver infiltration by CT 2014.   Hx of renal calculi      LEFT   Hyperlipidemia     Hypertension     Hypothyroidism      pt states hx of thyroid nodules   IBS (irritable bowel syndrome)     Internal hemorrhoids     [place]    mid upper stomach (05/08/2018)   OSA on CPAP       SETTING IS 14 (05/08/2018)   Statin intolerance     [place] ([place]      a. right thalamic infarct in 04/2016 felt by neuro to be due to small vessel disease.   Type II diabetes mellitus ([place]             Past Surgical History:  Procedure Laterality Date   CARDIAC CATHETERIZATION       CATARACT EXTRACTION W/ INTRAOCULAR LENS  IMPLANT, BILATERAL Bilateral 08/2016   COLONOSCOPY W/ BIOPSIES AND POLYPECTOMY   05/19/2005    adenomatous polyps   CORONARY STENT INTERVENTION N/A 05/09/2018    Procedure: CORONARY STENT INTERVENTION;  Surgeon: Nelva Bush, MD;  Location: [facility];  Service: Cardiovascular;  Laterality: N/A;   LEFT HEART CATH AND CORONARY ANGIOGRAPHY N/A 05/09/2018    Procedure: LEFT HEART CATH AND CORONARY ANGIOGRAPHY;  Surgeon: Nelva Bush, MD;  Location: [facility];  Service: Cardiovascular;  Laterality: N/A;   MELANOMA EXCISION   1980s    mid upper stomach   NEPHROLITHOTOMY Left 01/27/2013    Procedure: NEPHROLITHOTOMY PERCUTANEOUS;  Surgeon: Dutch Gray, MD;  Location: [facility];  Service: Urology;  Laterality: Left;   RIGHT/LEFT HEART CATH AND CORONARY ANGIOGRAPHY N/A 05/04/2021    Procedure: RIGHT/LEFT HEART CATH AND CORONARY ANGIOGRAPHY;  Surgeon: Early Osmond, MD;  Location: [facility];  Service: Cardiovascular;  Laterality: N/A;   TUBAL LIGATION       WISDOM TEETH EXTRACTED               Family History  Problem Relation Age of Onset   Hyperlipidemia Mother     Neurofibromatosis Father     Thyroid  disease Daughter     Hyperlipidemia Sister     Hyperlipidemia Brother     Diabetes Neg Hx     Stroke Neg Hx     Colon cancer Neg Hx     Esophageal cancer Neg Hx     Pancreatic cancer Neg Hx     Liver cancer Neg Hx        Social History         Socioeconomic History   Marital status: Married      Spouse name: Not on file   Number of children: 3   Years of education: 13   Highest education level: High school graduate  Occupational History   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 She does office work. One caffeinated drink daily.    Updated as of 04/30/2013    Social Determinants of Health    Financial Resource Strain: Not on file  Food Insecurity: Not on file  Transportation Needs: Not on file  Physical Activity: Not on file  Stress: Not on file  Social Connections: Not on file  Intimate Partner Violence: Not on file             Prior to Admission medications   Medication Sig Start Date End Date Taking? Authorizing Provider  amLODipine (NORVASC) 10 MG tablet TAKE 1/2 TABLET BY MOUTH EVERY DAY 04/20/21   Yes Jerline Pain, MD  aspirin EC 81 MG tablet Take 1 tablet (81 mg total) by mouth daily. 05/11/16   Yes Dessa Phi, DO  Blood Glucose Monitoring Suppl (ONETOUCH VERIO) w/Device KIT by Does not apply route. Use to check blood sugars once daily.     Yes [provider]  carvedilol (COREG) 6.25 MG tablet TAKE 1 TABLET BY MOUTH TWICE A DAY 04/20/21   Yes Jerline Pain, MD  glimepiride (AMARYL) 1 MG tablet TAKE 1 TABLET BY MOUTH EVERYDAY AT BEDTIME 03/07/21   Yes Elayne Snare, MD  glucose blood (ONE [facility]  ULTRA  TEST) test strip USE TO CHECK BLOOD SUGAR TWICE DAILY   Dx:E11.65 10/22/17   Yes Elayne Snare, MD  Lancets (ONETOUCH DELICA PLUS [id number]) MISC Use to test blood sugars once daily. 01/01/20   Yes Elayne Snare, MD  levothyroxine (SYNTHROID) 125 MCG tablet 1 tab daily except 1/2 on Sundays Patient taking differently: Take 125 mcg by mouth See admin instructions. Mon - Sat Only 06/07/21   Yes Elayne Snare, MD  Magnesium 500 MG TABS Take 500 mg by mouth 2 (two) times daily.     Yes [provider]  metFORMIN (GLUCOPHAGE) 1000 MG tablet TAKE 1 TABLET BY MOUTH TWICE A DAY 04/20/21   Yes Elayne Snare, MD  nitroGLYCERIN (NITROSTAT) 0.4 MG SL tablet Place 1 tablet (0.4 mg total) under the tongue every 5 (five) minutes x 3 doses as needed for chest pain. 05/10/18   Yes Daune Perch, NP  OneTouch Delica Lancets 25W MISC by Does not apply route. Use to test blood sugars once daily     Yes [provider]  ramipril (ALTACE) 10 MG capsule TAKE 1 CAPSULE BY MOUTH EVERY DAY 04/11/21   Yes Jerline Pain, MD  rosuvastatin (CRESTOR) 5 MG tablet TAKE 1/2 TABLET BY MOUTH DAILY Patient taking differently: Take 1.25 mg by mouth daily. 06/04/20   Yes Jerline Pain, MD            Current Outpatient Medications  Medication Sig Dispense Refill   amLODipine (NORVASC) 10 MG tablet TAKE 1/2 TABLET BY MOUTH EVERY DAY 45 tablet 3   aspirin EC 81 MG tablet Take 1 tablet (81 mg total) by mouth daily. 30 tablet 0   Blood Glucose Monitoring Suppl (ONETOUCH VERIO) w/Device KIT by Does not apply route. Use to check blood sugars once daily.       carvedilol (COREG) 6.25 MG tablet TAKE 1 TABLET BY MOUTH TWICE A DAY 180 tablet 3   glimepiride (AMARYL) 1 MG tablet TAKE 1 TABLET BY MOUTH EVERYDAY AT BEDTIME 90 tablet 1   glucose blood (ONE TOUCH ULTRA TEST) test strip USE TO CHECK BLOOD SUGAR TWICE DAILY   Dx:E11.65 100 each 3   Lancets (ONETOUCH DELICA PLUS [id number]) MISC Use to test blood sugars once daily. 100  each 0   levothyroxine (SYNTHROID) 125 MCG tablet 1 tab daily except 1/2 on Sundays (Patient taking differently: Take 125 mcg by mouth See admin instructions. Mon - Sat Only) 90 tablet 1   Magnesium 500 MG TABS Take 500 mg by mouth 2 (two) times daily.       metFORMIN (GLUCOPHAGE) 1000 MG tablet TAKE 1 TABLET BY MOUTH TWICE A DAY 180 tablet 1   nitroGLYCERIN (NITROSTAT) 0.4 MG SL tablet Place 1 tablet (0.4 mg total) under the tongue every 5 (five) minutes x 3 doses as needed for chest pain. 25 tablet 12   OneTouch Delica Lancets 76O MISC by Does not apply route. Use to test blood sugars once daily       ramipril (ALTACE) 10 MG capsule TAKE 1 CAPSULE BY MOUTH EVERY DAY 90 capsule 3   rosuvastatin (CRESTOR) 5 MG tablet TAKE 1/2 TABLET BY MOUTH DAILY (Patient taking differently: Take 1.25 mg by mouth daily.) 45 tablet 2             Current Facility-Administered Medications  Medication Dose Route Frequency Provider Last Rate Last Admin   sodium chloride flush (NS) 0.9 % injection 3 mL  3 mL Intravenous Q12H Lenna Sciara  K, MD               Allergies  Allergen Reactions   Crestor [Rosuvastatin]        REACTION: N/V and heartburn, can very small dose    Lipitor [Atorvastatin]        REACTION: Hot flashes and flu-like symptoms   Pravastatin Sodium        REACTION: Neck swelling and pain in her shoulders and arms.   Sulfonamide Derivatives        Rxn Unknown   Zetia [Ezetimibe]        GI upset   Zocor [Simvastatin] Other (See Comments)      GI issues.. Pt unknown of severity   Niacin Nausea And Vomiting          Review of Systems:               General:                      normal appetite, + decreased energy, no weight gain, + weight loss, no fever             Cardiac:                       no chest pain with exertion, no chest pain at rest, +SOB with moderate exertion, no resting SOB, no PND, no orthopnea, no palpitations, no arrhythmia, no atrial fibrillation, + LE edema, + dizzy  spells, no syncope             Respiratory:                 + exertional shortness of breath, no home oxygen, no productive cough, no dry cough, no bronchitis, no wheezing, no hemoptysis, no asthma, no pain with inspiration or cough, + sleep apnea, + CPAP at night             GI:                               no difficulty swallowing, no reflux, no frequent heartburn, no hiatal hernia, no abdominal pain, + constipation, no diarrhea, no hematochezia, no hematemesis, no melena             GU:                              no dysuria,  no frequency, no urinary tract infection, no hematuria, no kidney stones, no kidney disease             Vascular:                     no pain suggestive of claudication, no pain in feet, no leg cramps, no varicose veins, no DVT, no non-healing foot ulcer             Neuro:                         + stroke, no TIA's, no seizures, no headaches, no temporary blindness one eye,  no slurred speech, + peripheral neuropathy in hands, no chronic pain, no instability of gait, no memory/cognitive dysfunction             Musculoskeletal:         no arthritis , no joint  swelling, no myalgias, no difficulty walking, normal mobility              Skin:                            no rash, no itching, no skin infections, no pressure sores or ulcerations             Psych:                         no anxiety, no depression, no nervousness, no unusual recent stress             Eyes:                           no blurry vision, no floaters, no recent vision changes, + wears glasses            ENT:                            no hearing loss, no loose or painful teeth, no dentures, last saw dentist last year             Hematologic:               no easy bruising, no abnormal bleeding, no clotting disorder, no frequent epistaxis             Endocrine:                   + diabetes, does check CBG's at home                            Physical Exam:               BP 129/71 (BP Location: Right Arm,  Patient Position: Sitting)    Pulse 65    Resp 20    Ht 5' 3"  (1.6 m)    SpO2 99% Comment: RA   BMI 27.56 kg/m              General:                      well-appearing             HEENT:                       Unremarkable, NCAT, PERLA, EOMI,              Neck:                           no JVD, no bruits, no adenopathy              Chest:                          clear to auscultation, symmetrical breath sounds, no wheezes, no rhonchi              CV:                              RRR, 3/6 systolic murmur RSB, no diastolic murmur  Abdomen:                    soft, non-tender, no masses              Extremities:                 warm, well-perfused, pulses palpable in ankles , no lower extremity edema             Rectal/GU                   Deferred             Neuro:                         Grossly non-focal and symmetrical throughout             Skin:                            Clean and dry, no rashes, no breakdown   Diagnostic Tests:   ECHOCARDIOGRAM REPORT         Patient Name:   Joanna [name]  Date of Exam: 03/24/2021  Medical Rec #:  [id number]     Height:       63.5 in  Accession #:    [id number]    Weight:       157.2 lb  Date of Birth:  [birth year]      BSA:          1.756 m  Patient Age:    4 years      BP:           142/82 mmHg  Patient Gender: F             HR:           65 bpm.  Exam Location:  [address]   Procedure: 2D Echo, Cardiac Doppler, Color Doppler and Strain Analysis   Indications:    I35.0 Aortic Stenosis     History:        Patient has prior history of Echocardiogram examinations,  most                  recent 09/07/2020. CAD; Risk Factors:Hypertension, Diabetes  and                  HLD.     Sonographer:    Marygrace Drought RCS  Referring Phys: [id number]     1. Left ventricular ejection fraction, by estimation, is 60 to 65%. The  left ventricle has normal function. The left ventricle has no regional  wall motion  abnormalities. There is mild concentric left ventricular  hypertrophy. Left ventricular diastolic  parameters are consistent with Grade I diastolic dysfunction (impaired  relaxation). Elevated left ventricular end-diastolic pressure. The average  left ventricular global longitudinal strain is -19.8 %. The global  longitudinal strain is normal.   2. Right ventricular systolic function is normal. The right ventricular  size is normal. There is normal pulmonary artery systolic pressure.   3. The mitral valve is normal in structure. Trivial mitral valve  regurgitation. No evidence of mitral stenosis. Moderate mitral annular  calcification.   4. The aortic valve is calcified. There is severe calcifcation of the  aortic valve. There is severe thickening of the aortic valve. Aortic valve  regurgitation is trivial.  Severe aortic valve stenosis.   5. The inferior vena cava is normal in size with greater than 50%  respiratory variability, suggesting right atrial pressure of 3 mmHg.   FINDINGS   Left Ventricle: Left ventricular ejection fraction, by estimation, is 60  to 65%. The left ventricle has normal function. The left ventricle has no  regional wall motion abnormalities. The average left ventricular global  longitudinal strain is -19.8 %.  The global longitudinal strain is normal. The left ventricular internal  cavity size was normal in size. There is mild concentric left ventricular  hypertrophy. Left ventricular diastolic parameters are consistent with  Grade I diastolic dysfunction  (impaired relaxation). Elevated left ventricular end-diastolic pressure.   Right Ventricle: The right ventricular size is normal. No increase in  right ventricular wall thickness. Right ventricular systolic function is  normal. There is normal pulmonary artery systolic pressure. The tricuspid  regurgitant velocity is 2.02 m/s, and   with an assumed right atrial pressure of 3 mmHg, the estimated right   ventricular systolic pressure is 73.2 mmHg.   Left Atrium: Left atrial size was normal in size.   Right Atrium: Right atrial size was normal in size.   Pericardium: There is no evidence of pericardial effusion.   Mitral Valve: The mitral valve is normal in structure. There is mild  thickening of the mitral valve leaflet(s). Moderate mitral annular  calcification. Trivial mitral valve regurgitation. No evidence of mitral  valve stenosis.   Tricuspid Valve: The tricuspid valve is normal in structure. Tricuspid  valve regurgitation is trivial. No evidence of tricuspid stenosis.   Aortic Valve: The aortic valve is calcified. There is severe calcifcation  of the aortic valve. There is severe thickening of the aortic valve.  Aortic valve regurgitation is trivial. Aortic regurgitation PHT measures  522 msec. Severe aortic stenosis is  present. Aortic valve mean gradient measures 54.0 mmHg. Aortic valve peak  gradient measures 81.4 mmHg. Aortic valve area, by VTI measures 0.58 cm.   Pulmonic Valve: The pulmonic valve was normal in structure. Pulmonic valve  regurgitation is trivial. No evidence of pulmonic stenosis.   Aorta: The aortic root is normal in size and structure.   Venous: The inferior vena cava is normal in size with greater than 50%  respiratory variability, suggesting right atrial pressure of 3 mmHg.   IAS/Shunts: No atrial level shunt detected by color flow Doppler.      LEFT VENTRICLE  PLAX 2D  LVIDd:         4.20 cm   Diastology  LVIDs:         2.80 cm   LV e' medial:    5.77 cm/s  LV PW:         1.10 cm   LV E/e' medial:  19.2  LV IVS:        1.20 cm   LV e' lateral:   6.53 cm/s  LVOT diam:     1.80 cm   LV E/e' lateral: 17.0  LV SV:         69  LV SV Index:   40        2D Longitudinal Strain  LVOT Area:     2.54 cm  2D Strain GLS (A2C):   -20.4 %                           2D Strain GLS (A3C):   -19.0 %  2D Strain GLS (A4C):   -20.4 %                            2D Strain GLS Avg:     -19.8 %   RIGHT VENTRICLE  RV Basal diam:  3.40 cm  RV S prime:     16.90 cm/s  TAPSE (M-mode): 2.1 cm  RVSP:           19.3 mmHg   LEFT ATRIUM             Index        RIGHT ATRIUM           Index  LA diam:        4.40 cm 2.51 cm/m   RA Pressure: 3.00 mmHg  LA Vol (A2C):   48.9 ml 27.85 ml/m  RA Area:     13.90 cm  LA Vol (A4C):   27.6 ml 15.72 ml/m  RA Volume:   31.80 ml  18.11 ml/m  LA Biplane Vol: 38.8 ml 22.10 ml/m   AORTIC VALVE  AV Area (Vmax):    0.64 cm  AV Area (Vmean):   0.58 cm  AV Area (VTI):     0.58 cm  AV Vmax:           451.00 cm/s  AV Vmean:          351.000 cm/s  AV VTI:            1.190 m  AV Peak Grad:      81.4 mmHg  AV Mean Grad:      54.0 mmHg  LVOT Vmax:         113.00 cm/s  LVOT Vmean:        79.700 cm/s  LVOT VTI:          0.273 m  LVOT/AV VTI ratio: 0.23  AI PHT:            522 msec     AORTA  Ao Root diam: 2.70 cm  Ao Asc diam:  3.10 cm   MITRAL VALVE                TRICUSPID VALVE  MV Area (PHT):              TR Peak grad:   16.3 mmHg  MV Decel Time:              TR Vmax:        202.00 cm/s  MR Peak grad: 80.3 mmHg     Estimated RAP:  3.00 mmHg  MR Mean grad: 59.0 mmHg     RVSP:           19.3 mmHg  MR Vmax:      448.00 cm/s  MR Vmean:     367.0 cm/s    SHUNTS  MV E velocity: 111.00 cm/s  Systemic VTI:  0.27 m  MV A velocity: 133.00 cm/s  Systemic Diam: 1.80 cm  MV E/A ratio:  0.83   Skeet Latch MD  Electronically signed by Skeet Latch MD  Signature Date/Time: 03/24/2021/3:00:03 PM         Final       Physicians   Panel Physicians Referring Physician Case Authorizing Physician  Early Osmond, MD (Primary)        Procedures   RIGHT/LEFT HEART CATH AND CORONARY ANGIOGRAPHY    Conclusion  Prox LAD lesion is 40% stenosed.   Ost RCA to Prox RCA lesion is 30% stenosed.   Ost 3rd Mrg lesion is 30% stenosed.   Ost 2nd Diag lesion is 70% stenosed.    Non-stenotic Mid LAD lesion was previously treated.   1.  Normal filling pressures, cardiac output, cardiac index 2.  Patent distal LAD stent with one-vessel ostial second diagonal disease with mild to moderate disease elsewhere; medical therapy will be pursued.   Indications   Nonrheumatic aortic valve stenosis [I35.0 (ICD-10-CM)]    Procedural Details   Technical Details The patient is a 71 year old female with a history of coronary artery disease status post PCI of the distal LAD in 2019, hypertension, hyperlipidemia, type 2 diabetes, history of stroke, and severe symptomatic aortic stenosis was evaluated in the outpatient setting.  She is referred for preprocedural assessment consisting of right heart catheterization and coronary angiography study.  After obtaining consent the patient was brought to the cardiac catheterization laboratory prepped draped sterile fashion ultrasound was used to gain access to the right radial artery and a 6 French glide sheath placed there.  5 mg of verapamil and 5000 units of heparin were administered.  Previous placed antecubital IV was exchanged for a 5 French femoral glide sheath.  Right heart catheterization and coronary angiography study was then performed .  At the conclusion of the procedure manual pressure was applied to the antecubital site and a TR band was placed on the radial site.  There were no acute complications.   Estimated blood loss <50 mL.   During this procedure medications were administered to achieve and maintain moderate conscious sedation while the patient's heart rate, blood pressure, and oxygen saturation were continuously monitored and I was present face-to-face 100% of this time.    Medications (Filter: Administrations occurring from [phone] to 0928 on 05/04/21)  important  Continuous medications are totaled by the amount administered until 05/04/21 0928.    Heparin (Porcine) in NaCl 1000-0.9 UT/500ML-% SOLN (mL) Total volume:   1,000 mL Date/Time Rate/Dose/Volume Action    05/04/21 0840 500 mL Given    0840 500 mL Given      midazolam (VERSED) injection (mg) Total dose:  1 mg Date/Time Rate/Dose/Volume Action    05/04/21 0851 1 mg Given      fentaNYL (SUBLIMAZE) injection (mcg) Total dose:  25 mcg Date/Time Rate/Dose/Volume Action    05/04/21 0851 25 mcg Given      lidocaine (PF) (XYLOCAINE) 1 % injection (mL) Total volume:  4 mL Date/Time Rate/Dose/Volume Action    05/04/21 0856 2 mL Given    0857 2 mL Given      Radial Cocktail/Verapamil only Total dose:  Cannot be calculated* *Administration dose not documented Date/Time Rate/Dose/Volume Action    05/04/21 0859   Given      heparin sodium (porcine) injection (Units) Total dose:  5,000 Units Date/Time Rate/Dose/Volume Action    05/04/21 0900 5,000 Units Given      iohexol (OMNIPAQUE) 350 MG/ML injection (mL) Total volume:  65 mL Date/Time Rate/Dose/Volume Action    05/04/21 0927 65 mL Given      Sedation Time   Sedation Time Physician-1: 34 minutes 34 seconds Contrast   Medication Name Total Dose  iohexol (OMNIPAQUE) 350 MG/ML injection 65 mL    Radiation/Fluoro   Fluoro time: 6.1 (min) DAP: 11.8 (Gycm2) Cumulative Air Kerma: 147.8 (mGy) Complications      Complications documented before study signed (05/04/2021  9:47 AM)  RIGHT/LEFT HEART CATH AND CORONARY ANGIOGRAPHY   None Documented by Early Osmond, MD 05/04/2021  9:43 AM  Date Found: 05/04/2021  Time Range: Intraprocedure          Coronary Findings   Diagnostic Dominance: Right Left Main  Vessel is large. Vessel is angiographically normal.    Left Anterior Descending  Vessel is large.  Prox LAD lesion is 40% stenosed.  Non-stenotic Mid LAD lesion was previously treated. Vessel is the culprit lesion.    First Diagonal Branch  Vessel is small in size.    Second Diagonal Branch  Vessel is moderate in size.  Ost 2nd Diag lesion is 70% stenosed.     Left Circumflex  Vessel is large.    First Obtuse Marginal Branch  Vessel is small in size.    Second Obtuse Marginal Branch  Vessel is moderate in size.    Third Obtuse Marginal Branch  Vessel is moderate in size.  Ost 3rd Mrg lesion is 30% stenosed.    Right Coronary Artery  Vessel is large. Anterior take-off of the RCA.  Ost RCA to Prox RCA lesion is 30% stenosed.    Right Posterior Descending Artery  Vessel is moderate in size.    Right Posterior Atrioventricular Artery  Vessel is moderate in size.    Intervention    No interventions have been documented.    Coronary Diagrams   Diagnostic Dominance: Right Intervention   Implants      No implant documentation for this case.    [employer] Images    Show images for CARDIAC CATHETERIZATION Images on Long Term Storage    Show images for [place], [place] to Procedure Log   Procedure Log    Hemo Data   Flowsheet Row Most Recent Value  Fick Cardiac Output 6.7 L/min  Fick Cardiac Output Index 3.87 (L/min)/BSA  RA A Wave 5 mmHg  RA V Wave 3 mmHg  RA Mean 2 mmHg  RV Systolic Pressure 29 mmHg  RV Diastolic Pressure 0 mmHg  RV EDP 5 mmHg  PA Systolic Pressure 28 mmHg  PA Diastolic Pressure 9 mmHg  PA Mean 17 mmHg  PW A Wave 11 mmHg  PW V Wave 9 mmHg  PW Mean 7 mmHg  AO Systolic Pressure 599 mmHg  AO Diastolic Pressure 62 mmHg  AO Mean 88 mmHg  QP/QS 0.63  TPVR Index 4.39 HRUI  TSVR Index 22.73 HRUI  PVR SVR Ratio 0.12  TPVR/TSVR Ratio 0.19      ADDENDUM REPORT: 05/12/2021 14:28   EXAM: OVER-READ INTERPRETATION  CT CHEST   The following report is an over-read performed by radiologist Dr. Samara Snide [facility] on 05/12/2021. This over-read does not include interpretation of cardiac or coronary anatomy or pathology. The coronary CTA interpretation by the cardiologist is attached.   COMPARISON:  05/03/2018 coronary CT.   FINDINGS: Please see the separate concurrent chest CT  angiogram report for details.   IMPRESSION: Please see the separate concurrent chest CT angiogram report for details.     Electronically Signed   By: Ilona Sorrel M.D.   On: 05/12/2021 14:28    Addended by Sharyn Blitz, MD on 05/12/2021  2:31 PM    Study Result   Narrative & Impression  CLINICAL DATA:  [phone] with severe aortic stenosis being evaluated for a TAVR procedure.   EXAM: Cardiac TAVR CT   TECHNIQUE: The patient was scanned on a [employer]. A 120 kV retrospective  scan was triggered in the descending thoracic aorta at 111 HU's. Gantry rotation speed was 250 msecs and collimation was .6 mm. No beta blockade or nitro were given. The 3D data set was reconstructed in 5% intervals of the R-R cycle. Systolic and diastolic phases were analyzed on a dedicated work station using MPR, MIP and VRT modes. The patient received 80 cc of contrast.   FINDINGS: Aortic Root:   Aortic valve: Tricuspid   Aortic valve calcium score: 2538   Aortic annulus:   Diameter: 10m x 118m  Perimeter: 7053m Area: 371 mm^2   Calcifications: No calcifications   Coronary height: Min Left - 65m43max Left - 21mm88mn Right - 16mm 42mnotubular height: Left cusp - 23mm; 72mt cusp - 18mm; N41mronary cusp - 21mm   L32m(as measured 3 mm below the annulus):   Diameter: 26mm x 1864m Are43m45 mm^2   Calcifications: No calcifications   Aortic sinus width: Left cusp - 28mm; Right5mp - 28mm; Noncor3my cusp - 29mm   Sinotu26mr junction width: 30mm x 27mm   52mmum 49mroscopic Angle for Delivery: LAO 24 CAU 1   Cardiac:   Right atrium: Mild enlargement   Right ventricle: Mild enlargement   Pulmonary arteries: Normal size   Pulmonary veins: Normal configuration   Left atrium: Mild enlargement   Left ventricle: Normal size   Pericardium: Normal thickness   Coronary arteries: Distal LAD stent   IMPRESSION: 1. Trileaflet aortic valve with  severe calcifications (AV calcium score 2538)   2. Aortic annulus measures 25mm x 19mm in d48mter 29m perimeter 70mm and area 371 22m. No annular or LVOT calcifications. Annular measurements are suitable for delivery of 23mm Edwards Sapien69malve   3. Sufficient coronary to annulus distance, measuring 65mm to left main an18mmm to RCA   4. Opti59mFluoroscopic Angle for Delivery:  LAO 24 CAU 1   Electronically Signed: By: Christopher  Schumann Oswaldo Milian4:16      Narrative & Impression  CLINICAL DATA:  Aortic valve replacement (TAVR), pre-op eval. Aortic valve stenosis, severe.   EXAM: CT ANGIOGRAPHY CHEST, ABDOMEN AND PELVIS   TECHNIQUE: Multidetector CT imaging through the chest, abdomen and pelvis was performed using the standard protocol during bolus administration of intravenous contrast. Multiplanar reconstructed images and MIPs were obtained and reviewed to evaluate the vascular anatomy.   CONTRAST:  100mL OMNIPAQUE IOHEXOL74m MG/ML SOLN   COMPARISON:  05/03/2018 coronary CT.  04/22/2013 CT abdomen/pelvis.   FINDINGS: CTA CHEST FINDINGS   Cardiovascular: Borderline mild cardiomegaly. Diffuse thickening and coarse calcification of the aortic valve. No significant pericardial effusion/thickening. Left anterior descending and left circumflex coronary atherosclerosis. Atherosclerotic nonaneurysmal thoracic aorta. Normal caliber pulmonary arteries. No central pulmonary emboli.   Mediastinum/Nodes: No discrete thyroid nodules. Unremarkable esophagus. No pathologically enlarged axillary, mediastinal or hilar lymph nodes.   Lungs/Pleura: No pneumothorax. No pleural effusion. No acute consolidative airspace disease, lung masses or significant pulmonary nodules.   Musculoskeletal: No aggressive appearing focal osseous lesions. Mild thoracic spondylosis.   CTA ABDOMEN AND PELVIS FINDINGS   Hepatobiliary: Normal liver with no liver mass. Normal  gallbladder with no radiopaque cholelithiasis. No biliary ductal dilatation.   Pancreas: Normal, with no mass or duct dilation.   Spleen: Normal size. No mass.   Adrenals/Urinary Tract: Mildly lobulated adrenal glands without discrete adrenal nodules. No contour deforming renal masses. No hydronephrosis. Nonobstructing 8 mm lower left renal stone. Normal bladder.  Stomach/Bowel: Normal non-distended stomach. Normal caliber small bowel with no small bowel wall thickening. Normal appendix. Mild left colonic diverticulosis, with no large bowel wall thickening or significant pericolonic fat stranding.   Vascular/Lymphatic: Atherosclerotic nonaneurysmal abdominal aorta. No pathologically enlarged lymph nodes in the abdomen or pelvis.   Reproductive: Grossly normal uterus.  No adnexal mass.   Other: No pneumoperitoneum, ascites or focal fluid collection.   Musculoskeletal: No aggressive appearing focal osseous lesions. Marked degenerative disc disease at L5-S1.   VASCULAR MEASUREMENTS PERTINENT TO TAVR:   AORTA:   Minimal Aortic Diameter-12.4 x 11.5 mm   Severity of Aortic Calcification-moderate   RIGHT PELVIS:   Right Common Iliac Artery -   Minimal Diameter-8.7 x 7.6 mm   Tortuosity-mild   Calcification-moderate   Right External Iliac Artery -   Minimal Diameter-7.2 x 6.5 mm   Tortuosity-mild-to-moderate   Calcification-mild   Right Common Femoral Artery -   Minimal Diameter-7.6 x 7.0 mm   Tortuosity-mild   Calcification-mild-to-moderate   LEFT PELVIS:   Left Common Iliac Artery -   Minimal Diameter-9.3 x 8.2 mm   Tortuosity-mild-to-moderate   Calcification-mild-to-moderate   Left External Iliac Artery -   Minimal Diameter-7.3 x 7.0 mm   Tortuosity-moderate   Calcification-mild   Left Common Femoral Artery -   Minimal Diameter-7.4 x 6.4 mm   Tortuosity-mild   Calcification-mild-to-moderate   Review of the MIP images confirms the  above findings.   IMPRESSION: 1. Vascular findings and measurements pertinent to potential TAVR procedure, as detailed. 2. Diffuse thickening and coarse calcification of the aortic valve, compatible with the reported clinical history of severe aortic stenosis. 3. Two-vessel coronary atherosclerosis. 4. Mild left colonic diverticulosis. 5. Nonobstructing left nephrolithiasis. 6. Aortic Atherosclerosis (ICD10-I70.0).     Electronically Signed   By: Ilona Sorrel M.D.   On: 05/12/2021 14:24      Impression:   This 71 year old woman has stage D, severe, symptomatic aortic stenosis with New York Heart Association class II symptoms of exertional fatigue and shortness of breath consistent with chronic diastolic congestive heart failure.  She has recently started having some episodes of dizziness as well as swelling in her ankles.  I have personally reviewed her 2D echocardiogram, cardiac catheterization, and CTA studies.  Her echo from November 2022 showed a severely calcified and thickened aortic valve with restricted leaflet mobility.  The mean gradient was 54 mmHg with a peak gradient of 81.4 mmHg and a valve area of 0.58 cm consistent with critical aortic stenosis.  There is trivial aortic insufficiency.  Left ventricular ejection fraction was 60 to 65% with grade 1 diastolic dysfunction.  Cardiac catheterization showed a patent stent in the mid LAD, a 70% ostial second diagonal stenosis, and otherwise mild to moderate nonobstructive coronary disease which can be treated medically.  I agree that aortic valve replacement is indicated in this patient for relief of her symptoms and to prevent progressive left ventricular deterioration.  She is 7 but has multiple comorbidities that may increase her risk with open surgical aortic valve replacement.  Therefore I think transcatheter aortic valve replacement is a reasonable alternative.  Her gated cardiac CTA shows anatomy suitable for TAVR using a SAPIEN  3 valve.  Her abdominal and pelvic CTA shows adequate pelvic vascular anatomy to allow transfemoral insertion.   The patient and her husband were counseled at length regarding treatment alternatives for management of severe symptomatic aortic stenosis. The risks and benefits of surgical intervention has been discussed in detail.  Long-term prognosis with medical therapy was discussed. Alternative approaches such as conventional surgical aortic valve replacement, transcatheter aortic valve replacement, and palliative medical therapy were compared and contrasted at length. This discussion was placed in the context of the patient's own specific clinical presentation and past medical history. All of their questions have been addressed.    Following the decision to proceed with transcatheter aortic valve replacement, a discussion was held regarding what types of management strategies would be attempted intraoperatively in the event of life-threatening complications, including whether or not the patient would be considered a candidate for the use of cardiopulmonary bypass and/or conversion to open sternotomy for attempted surgical intervention.  She is certainly a candidate for emergent sternotomy to manage any intraoperative complications.  The patient is aware of the fact that transient use of cardiopulmonary bypass may be necessary. The patient has been advised of a variety of complications that might develop including but not limited to risks of death, stroke, paravalvular leak, aortic dissection or other major vascular complications, aortic annulus rupture, device embolization, cardiac rupture or perforation, mitral regurgitation, acute myocardial infarction, arrhythmia, heart block or bradycardia requiring permanent pacemaker placement, congestive heart failure, respiratory failure, renal failure, pneumonia, infection, other late complications related to structural valve deterioration or migration, or other  complications that might ultimately cause a temporary or permanent loss of functional independence or other long term morbidity. The patient provides full informed consent for the procedure as described and all questions were answered.       Plan:  Transfemoral TAVR using a SAPIEN 3 valve.     Gaye Pollack, MD

## 2021-06-20 NOTE — Progress Notes (Signed)
PCP - Dr. Elsie Stain Cardiologist - Dr. Candee Furbish  PPM/ICD - denies   Chest x-ray - 06/20/21 at PAT EKG -  06/20/21 at PAT Stress Test - denies ECHO - 03/24/21 Cardiac Cath - 05/04/21  Sleep Study - 08/2005, OSA+ CPAP - yes  DM- Type 2 Fasting Blood Sugar - 135-160 Checks Blood Sugar 2-3 times a day  Blood Thinner Instructions: n/a Aspirin Instructions: continue thru DOS. Do not take DOS  ERAS Protcol - no, NPO   COVID TEST- 06/20/21 at PAT   Anesthesia review: yes, cardiac hx  Patient denies shortness of breath, fever, cough and chest pain at PAT appointment   All instructions explained to the patient, with a verbal understanding of the material. Patient agrees to go over the instructions while at home for a better understanding. Patient also instructed to wear a mask in public after being tested for COVID-19. The opportunity to ask questions was provided.

## 2021-06-21 ENCOUNTER — Encounter (HOSPITAL_COMMUNITY)
Admission: RE | Disposition: A | Discharge status: Home / Self Care | Payer: Self-pay | Source: Home / Self Care | Attending: Internal Medicine

## 2021-06-21 ENCOUNTER — Inpatient Hospital Stay (HOSPITAL_COMMUNITY): Payer: Medicare Other | Admitting: Physician Assistant

## 2021-06-21 ENCOUNTER — Encounter (HOSPITAL_COMMUNITY): Payer: Self-pay | Admitting: Internal Medicine

## 2021-06-21 ENCOUNTER — Inpatient Hospital Stay (HOSPITAL_COMMUNITY): Payer: Medicare Other | Admitting: Anesthesiology

## 2021-06-21 ENCOUNTER — Other Ambulatory Visit: Payer: Self-pay | Admitting: Cardiology

## 2021-06-21 ENCOUNTER — Other Ambulatory Visit: Payer: Self-pay

## 2021-06-21 ENCOUNTER — Inpatient Hospital Stay (HOSPITAL_COMMUNITY)
Admission: RE | Admit: 2021-06-21 | Discharge: 2021-06-21 | Disposition: A | Discharge status: Home / Self Care | Payer: Medicare Other | Source: Ambulatory Visit | Attending: Internal Medicine | Admitting: Internal Medicine

## 2021-06-21 ENCOUNTER — Inpatient Hospital Stay (HOSPITAL_COMMUNITY)
Admission: RE | Admit: 2021-06-21 | Discharge: 2021-06-23 | DRG: 266 | Disposition: A | Discharge status: Home / Self Care | Payer: Medicare Other | Attending: Internal Medicine | Admitting: Internal Medicine

## 2021-06-21 DIAGNOSIS — Z7982 Long term (current) use of aspirin: Secondary | ICD-10-CM | POA: Diagnosis not present

## 2021-06-21 DIAGNOSIS — Z8349 Family history of other endocrine, nutritional and metabolic diseases: Secondary | ICD-10-CM | POA: Diagnosis not present

## 2021-06-21 DIAGNOSIS — Z006 Encounter for examination for normal comparison and control in clinical research program: Secondary | ICD-10-CM | POA: Diagnosis not present

## 2021-06-21 DIAGNOSIS — E039 Hypothyroidism, unspecified: Secondary | ICD-10-CM | POA: Diagnosis present

## 2021-06-21 DIAGNOSIS — I2510 Atherosclerotic heart disease of native coronary artery without angina pectoris: Secondary | ICD-10-CM | POA: Diagnosis present

## 2021-06-21 DIAGNOSIS — Z955 Presence of coronary angioplasty implant and graft: Secondary | ICD-10-CM

## 2021-06-21 DIAGNOSIS — E1165 Type 2 diabetes mellitus with hyperglycemia: Secondary | ICD-10-CM | POA: Diagnosis present

## 2021-06-21 DIAGNOSIS — Z954 Presence of other heart-valve replacement: Secondary | ICD-10-CM | POA: Diagnosis not present

## 2021-06-21 DIAGNOSIS — Z882 Allergy status to sulfonamides status: Secondary | ICD-10-CM | POA: Diagnosis not present

## 2021-06-21 DIAGNOSIS — Z8673 Personal history of transient ischemic attack (TIA), and cerebral infarction without residual deficits: Secondary | ICD-10-CM | POA: Diagnosis not present

## 2021-06-21 DIAGNOSIS — Z85820 Personal history of malignant melanoma of skin: Secondary | ICD-10-CM | POA: Diagnosis not present

## 2021-06-21 DIAGNOSIS — Z79890 Hormone replacement therapy (postmenopausal): Secondary | ICD-10-CM | POA: Diagnosis not present

## 2021-06-21 DIAGNOSIS — R42 Dizziness and giddiness: Secondary | ICD-10-CM | POA: Diagnosis not present

## 2021-06-21 DIAGNOSIS — G4733 Obstructive sleep apnea (adult) (pediatric): Secondary | ICD-10-CM | POA: Diagnosis present

## 2021-06-21 DIAGNOSIS — Z86010 Personal history of colonic polyps: Secondary | ICD-10-CM | POA: Diagnosis not present

## 2021-06-21 DIAGNOSIS — Z95820 Peripheral vascular angioplasty status with implants and grafts: Secondary | ICD-10-CM

## 2021-06-21 DIAGNOSIS — Z952 Presence of prosthetic heart valve: Secondary | ICD-10-CM | POA: Diagnosis not present

## 2021-06-21 DIAGNOSIS — H532 Diplopia: Secondary | ICD-10-CM | POA: Diagnosis not present

## 2021-06-21 DIAGNOSIS — I350 Nonrheumatic aortic (valve) stenosis: Secondary | ICD-10-CM

## 2021-06-21 DIAGNOSIS — Z7984 Long term (current) use of oral hypoglycemic drugs: Secondary | ICD-10-CM

## 2021-06-21 DIAGNOSIS — Z87442 Personal history of urinary calculi: Secondary | ICD-10-CM | POA: Diagnosis not present

## 2021-06-21 DIAGNOSIS — I10 Essential (primary) hypertension: Secondary | ICD-10-CM | POA: Diagnosis present

## 2021-06-21 DIAGNOSIS — Z79899 Other long term (current) drug therapy: Secondary | ICD-10-CM | POA: Diagnosis not present

## 2021-06-21 DIAGNOSIS — H5500 Unspecified nystagmus: Secondary | ICD-10-CM | POA: Diagnosis not present

## 2021-06-21 DIAGNOSIS — I6381 Other cerebral infarction due to occlusion or stenosis of small artery: Secondary | ICD-10-CM | POA: Diagnosis not present

## 2021-06-21 DIAGNOSIS — E785 Hyperlipidemia, unspecified: Secondary | ICD-10-CM | POA: Diagnosis present

## 2021-06-21 DIAGNOSIS — I639 Cerebral infarction, unspecified: Secondary | ICD-10-CM | POA: Diagnosis not present

## 2021-06-21 DIAGNOSIS — Z888 Allergy status to other drugs, medicaments and biological substances status: Secondary | ICD-10-CM | POA: Diagnosis not present

## 2021-06-21 DIAGNOSIS — U071 COVID-19: Secondary | ICD-10-CM | POA: Diagnosis present

## 2021-06-21 HISTORY — PX: TRANSCATHETER AORTIC VALVE REPLACEMENT, TRANSFEMORAL: SHX6400

## 2021-06-21 HISTORY — PX: INTRAOPERATIVE TRANSTHORACIC ECHOCARDIOGRAM: SHX6523

## 2021-06-21 HISTORY — DX: Presence of prosthetic heart valve: Z95.2

## 2021-06-21 LAB — GLUCOSE, CAPILLARY
Glucose-Capillary: 116 mg/dL — ABNORMAL HIGH (ref 70–99)
Glucose-Capillary: 101 mg/dL — ABNORMAL HIGH (ref 70–99)
Glucose-Capillary: 106 mg/dL — ABNORMAL HIGH (ref 70–99)

## 2021-06-21 SURGERY — IMPLANTATION, AORTIC VALVE, TRANSCATHETER, FEMORAL APPROACH
Anesthesia: Monitor Anesthesia Care

## 2021-06-21 MED ORDER — MIDAZOLAM HCL 2 MG/2ML IJ SOLN
INTRAMUSCULAR | Status: AC
  Filled 2021-06-21: qty 2

## 2021-06-21 MED ORDER — CHLORHEXIDINE GLUCONATE 4 % EX LIQD: 60.0000 mL | Freq: Once | CUTANEOUS | Status: DC

## 2021-06-21 MED ORDER — HEPARIN SODIUM (PORCINE) 1000 UNIT/ML IJ SOLN
INTRAMUSCULAR | Status: AC
  Filled 2021-06-21: qty 2

## 2021-06-21 MED ORDER — SODIUM CHLORIDE 0.9 % IV SOLN: INTRAVENOUS | Status: AC

## 2021-06-21 MED ORDER — MIDAZOLAM HCL 2 MG/2ML IJ SOLN
INTRAMUSCULAR | Status: DC | PRN
  Administered 2021-06-21: 2 mg via INTRAVENOUS

## 2021-06-21 MED ORDER — ASPIRIN 81 MG PO CHEW
81.0000 mg | CHEWABLE_TABLET | Freq: Every day | ORAL | Status: DC
  Administered 2021-06-22 – 2021-06-23 (×2): 81 mg via ORAL
  Filled 2021-06-21 (×2): qty 1

## 2021-06-21 MED ORDER — LIDOCAINE HCL 1 % IJ SOLN
INTRAMUSCULAR | Status: DC | PRN
  Administered 2021-06-21: 5 mL

## 2021-06-21 MED ORDER — LEVOTHYROXINE SODIUM 25 MCG PO TABS
125.0000 ug | ORAL_TABLET | ORAL | Status: DC
Start: 2021-06-22 — End: 2021-06-23
  Administered 2021-06-22 – 2021-06-23 (×2): 125 ug via ORAL
  Filled 2021-06-21 (×2): qty 1

## 2021-06-21 MED ORDER — PROTAMINE SULFATE 10 MG/ML IV SOLN
INTRAVENOUS | Status: AC
  Filled 2021-06-21: qty 15

## 2021-06-21 MED ORDER — LIDOCAINE HCL 1 % IJ SOLN
INTRAMUSCULAR | Status: AC
  Filled 2021-06-21: qty 20

## 2021-06-21 MED ORDER — PHENYLEPHRINE 40 MCG/ML (10ML) SYRINGE FOR IV PUSH (FOR BLOOD PRESSURE SUPPORT)
PREFILLED_SYRINGE | INTRAVENOUS | Status: DC | PRN
  Administered 2021-06-21 (×3): 80 ug via INTRAVENOUS

## 2021-06-21 MED ORDER — CHLORHEXIDINE GLUCONATE 0.12 % MT SOLN
15.0000 mL | Freq: Once | OROMUCOSAL | Status: AC
  Administered 2021-06-21: 15 mL via OROMUCOSAL
  Filled 2021-06-21: qty 15

## 2021-06-21 MED ORDER — ESMOLOL HCL 100 MG/10ML IV SOLN
INTRAVENOUS | Status: DC | PRN
  Administered 2021-06-21: 30 mg via INTRAVENOUS

## 2021-06-21 MED ORDER — HEPARIN 6000 UNIT IRRIGATION SOLUTION
Status: AC
  Filled 2021-06-21: qty 1500

## 2021-06-21 MED ORDER — NITROGLYCERIN IN D5W 200-5 MCG/ML-% IV SOLN: 0.0000 ug/min | INTRAVENOUS | Status: DC

## 2021-06-21 MED ORDER — ACETAMINOPHEN 325 MG PO TABS
650.0000 mg | ORAL_TABLET | Freq: Four times a day (QID) | ORAL | Status: DC | PRN
  Administered 2021-06-21 – 2021-06-22 (×2): 650 mg via ORAL
  Filled 2021-06-21 (×2): qty 2

## 2021-06-21 MED ORDER — HEPARIN SODIUM (PORCINE) 1000 UNIT/ML IJ SOLN
INTRAMUSCULAR | Status: DC | PRN
  Administered 2021-06-21: 11000 [IU] via INTRAVENOUS

## 2021-06-21 MED ORDER — PROPOFOL 10 MG/ML IV BOLUS
INTRAVENOUS | Status: DC | PRN
  Administered 2021-06-21: 15 mg via INTRAVENOUS

## 2021-06-21 MED ORDER — SODIUM CHLORIDE 0.9 % IV SOLN: INTRAVENOUS | Status: DC

## 2021-06-21 MED ORDER — MAGNESIUM OXIDE -MG SUPPLEMENT 400 (240 MG) MG PO TABS
400.0000 mg | ORAL_TABLET | Freq: Two times a day (BID) | ORAL | Status: DC
  Administered 2021-06-21 – 2021-06-23 (×4): 400 mg via ORAL
  Filled 2021-06-21 (×4): qty 1

## 2021-06-21 MED ORDER — MAGNESIUM 500 MG PO TABS
500.0000 mg | ORAL_TABLET | Freq: Two times a day (BID) | ORAL | Status: DC
Start: 2021-06-21 — End: 2021-06-21

## 2021-06-21 MED ORDER — SODIUM CHLORIDE 0.9% FLUSH
3.0000 mL | Freq: Two times a day (BID) | INTRAVENOUS | Status: DC
  Administered 2021-06-22 (×2): 3 mL via INTRAVENOUS

## 2021-06-21 MED ORDER — SODIUM CHLORIDE 0.9% FLUSH: 3.0000 mL | INTRAVENOUS | Status: DC | PRN

## 2021-06-21 MED ORDER — SODIUM CHLORIDE 0.9 % IV SOLN: INTRAVENOUS | Status: DC | PRN

## 2021-06-21 MED ORDER — ACETAMINOPHEN 650 MG RE SUPP: 650.0000 mg | Freq: Four times a day (QID) | RECTAL | Status: DC | PRN

## 2021-06-21 MED ORDER — FENTANYL CITRATE (PF) 250 MCG/5ML IJ SOLN
INTRAMUSCULAR | Status: DC | PRN
Start: 2021-06-21 — End: 2021-06-21
  Administered 2021-06-21: 25 ug via INTRAVENOUS

## 2021-06-21 MED ORDER — CHLORHEXIDINE GLUCONATE 4 % EX LIQD: 30.0000 mL | CUTANEOUS | Status: DC

## 2021-06-21 MED ORDER — LEVOTHYROXINE SODIUM 50 MCG PO TABS: 62.5000 ug | ORAL_TABLET | ORAL | Status: DC

## 2021-06-21 MED ORDER — EPHEDRINE SULFATE-NACL 50-0.9 MG/10ML-% IV SOSY
PREFILLED_SYRINGE | INTRAVENOUS | Status: DC | PRN
  Administered 2021-06-21 (×2): 10 mg via INTRAVENOUS
  Administered 2021-06-21: 5 mg via INTRAVENOUS

## 2021-06-21 MED ORDER — SODIUM CHLORIDE 0.9 % IV SOLN: 250.0000 mL | INTRAVENOUS | Status: DC | PRN

## 2021-06-21 MED ORDER — IODIXANOL 320 MG/ML IV SOLN
INTRAVENOUS | Status: DC | PRN
  Administered 2021-06-21: 85 mL via INTRA_ARTERIAL

## 2021-06-21 MED ORDER — TRAMADOL HCL 50 MG PO TABS: 50.0000 mg | ORAL_TABLET | ORAL | Status: DC | PRN

## 2021-06-21 MED ORDER — OXYCODONE HCL 5 MG PO TABS: 5.0000 mg | ORAL_TABLET | ORAL | Status: DC | PRN

## 2021-06-21 MED ORDER — FENTANYL CITRATE (PF) 250 MCG/5ML IJ SOLN
INTRAMUSCULAR | Status: AC
  Filled 2021-06-21: qty 5

## 2021-06-21 MED ORDER — HEPARIN 6000 UNIT IRRIGATION SOLUTION
Status: DC | PRN
  Administered 2021-06-21: 3

## 2021-06-21 MED ORDER — ROSUVASTATIN CALCIUM 5 MG PO TABS
2.5000 mg | ORAL_TABLET | Freq: Every day | ORAL | Status: DC
  Administered 2021-06-21: 2.5 mg via ORAL
  Filled 2021-06-21 (×3): qty 1

## 2021-06-21 MED ORDER — INSULIN ASPART 100 UNIT/ML IJ SOLN
0.0000 [IU] | INTRAMUSCULAR | Status: DC
  Administered 2021-06-21: 2 [IU] via SUBCUTANEOUS
  Administered 2021-06-22 (×2): 4 [IU] via SUBCUTANEOUS
  Administered 2021-06-22 (×2): 2 [IU] via SUBCUTANEOUS

## 2021-06-21 MED ORDER — PROPOFOL 500 MG/50ML IV EMUL
INTRAVENOUS | Status: DC | PRN
  Administered 2021-06-21: 10 ug/kg/min via INTRAVENOUS

## 2021-06-21 MED ORDER — MORPHINE SULFATE (PF) 2 MG/ML IV SOLN: 1.0000 mg | INTRAVENOUS | Status: DC | PRN

## 2021-06-21 MED ORDER — PROTAMINE SULFATE 10 MG/ML IV SOLN
INTRAVENOUS | Status: DC | PRN
  Administered 2021-06-21: 110 mg via INTRAVENOUS

## 2021-06-21 MED ORDER — ONDANSETRON HCL 4 MG/2ML IJ SOLN: 4.0000 mg | Freq: Four times a day (QID) | INTRAMUSCULAR | Status: DC | PRN

## 2021-06-21 MED ORDER — SODIUM CHLORIDE 0.9 % IV SOLN: 250.0000 mL | INTRAVENOUS | Status: DC

## 2021-06-21 MED ORDER — CEFAZOLIN SODIUM-DEXTROSE 2-4 GM/100ML-% IV SOLN
2.0000 g | Freq: Three times a day (TID) | INTRAVENOUS | Status: AC
  Administered 2021-06-21 – 2021-06-22 (×2): 2 g via INTRAVENOUS
  Filled 2021-06-21 (×2): qty 100

## 2021-06-21 SURGICAL SUPPLY — 68 items
ADH SKN CLS APL DERMABOND .7 (GAUZE/BANDAGES/DRESSINGS) ×1
APL PRP STRL LF DISP 70% ISPRP (MISCELLANEOUS) ×1
BAG COUNTER SPONGE SURGICOUNT (BAG) ×2 IMPLANT
BAG DECANTER FOR FLEXI CONT (MISCELLANEOUS) ×1 IMPLANT
BAG SPNG CNTER NS LX DISP (BAG) ×1
BALLN TRUE 20X4.5 (BALLOONS) ×2
BALLOON TRUE 20X4.5 (BALLOONS) IMPLANT
BLADE CLIPPER SURG (BLADE) IMPLANT
BLADE STERNUM SYSTEM 6 (BLADE) IMPLANT
CABLE ADAPT CONN TEMP 6FT (ADAPTER) ×2 IMPLANT
CABLE ADAPT PACING TEMP 12FT (ADAPTER) ×1 IMPLANT
CANISTER SUCT 3000ML PPV (MISCELLANEOUS) IMPLANT
CATH DIAG EXPO 6F AL1 (CATHETERS) IMPLANT
CATH DIAG EXPO 6F VENT PIG 145 (CATHETERS) ×4 IMPLANT
CATH INFINITI 6F AL2 (CATHETERS) IMPLANT
CATH S G BIP PACING (CATHETERS) ×3 IMPLANT
CHLORAPREP W/TINT 26 (MISCELLANEOUS) ×2 IMPLANT
CLOSURE MYNX CONTROL 6F/7F (Vascular Products) ×1 IMPLANT
CNTNR URN SCR LID CUP LEK RST (MISCELLANEOUS) ×2 IMPLANT
CONT SPEC 4OZ STRL OR WHT (MISCELLANEOUS) ×4
COVER BACK TABLE 80X110 HD (DRAPES) IMPLANT
DECANTER SPIKE VIAL GLASS SM (MISCELLANEOUS) ×2 IMPLANT
DERMABOND ADVANCED (GAUZE/BANDAGES/DRESSINGS) ×1
DERMABOND ADVANCED .7 DNX12 (GAUZE/BANDAGES/DRESSINGS) ×1 IMPLANT
DEVICE CLOSURE PERCLS PRGLD 6F (VASCULAR PRODUCTS) ×2 IMPLANT
DRSG TEGADERM 4X10 (GAUZE/BANDAGES/DRESSINGS) ×2 IMPLANT
DRSG TEGADERM 4X4.75 (GAUZE/BANDAGES/DRESSINGS) ×4 IMPLANT
ELECT REM PT RETURN 9FT ADLT (ELECTROSURGICAL) ×2
ELECTRODE REM PT RTRN 9FT ADLT (ELECTROSURGICAL) ×1 IMPLANT
GAUZE SPONGE 4X4 12PLY STRL (GAUZE/BANDAGES/DRESSINGS) ×2 IMPLANT
GAUZE SPONGE 4X4 12PLY STRL LF (GAUZE/BANDAGES/DRESSINGS) ×2 IMPLANT
GLOVE SURG ENC MOIS LTX SZ7.5 (GLOVE) IMPLANT
GLOVE SURG ENC MOIS LTX SZ8 (GLOVE) IMPLANT
GLOVE SURG ORTHO LTX SZ7.5 (GLOVE) IMPLANT
GOWN STRL REUS W/ TWL LRG LVL3 (GOWN DISPOSABLE) IMPLANT
GOWN STRL REUS W/ TWL XL LVL3 (GOWN DISPOSABLE) ×1 IMPLANT
GOWN STRL REUS W/TWL LRG LVL3 (GOWN DISPOSABLE)
GOWN STRL REUS W/TWL XL LVL3 (GOWN DISPOSABLE) ×2
GUIDEWIRE SAF TJ AMPL .035X180 (WIRE) ×2 IMPLANT
GUIDEWIRE SAFE TJ AMPLATZ EXST (WIRE) ×2 IMPLANT
KIT BASIN OR (CUSTOM PROCEDURE TRAY) ×2 IMPLANT
KIT HEART LEFT (KITS) ×2 IMPLANT
KIT SAPIAN 3 ULTRA RESILIA 23 (Valve) ×1 IMPLANT
KIT TURNOVER KIT B (KITS) ×2 IMPLANT
NS IRRIG 1000ML POUR BTL (IV SOLUTION) ×2 IMPLANT
PACK ENDO MINOR (CUSTOM PROCEDURE TRAY) ×2 IMPLANT
PAD ARMBOARD 7.5X6 YLW CONV (MISCELLANEOUS) ×4 IMPLANT
PAD ELECT DEFIB RADIOL ZOLL (MISCELLANEOUS) ×2 IMPLANT
PERCLOSE PROGLIDE 6F (VASCULAR PRODUCTS) ×4
POSITIONER HEAD DONUT 9IN (MISCELLANEOUS) ×2 IMPLANT
SET MICROPUNCTURE 5F STIFF (MISCELLANEOUS) ×2 IMPLANT
SHEATH BRITE TIP 7FR 35CM (SHEATH) ×2 IMPLANT
SHEATH PINNACLE 6F 10CM (SHEATH) ×2 IMPLANT
SHEATH PINNACLE 8F 10CM (SHEATH) ×2 IMPLANT
SLEEVE REPOSITIONING LENGTH 30 (MISCELLANEOUS) ×2 IMPLANT
STOPCOCK MORSE 400PSI 3WAY (MISCELLANEOUS) ×4 IMPLANT
SUT PROLENE 6 0 C 1 30 (SUTURE) IMPLANT
SUT SILK  1 MH (SUTURE) ×2
SUT SILK 1 MH (SUTURE) ×1 IMPLANT
SYR 50ML LL SCALE MARK (SYRINGE) ×3 IMPLANT
SYR BULB IRRIG 60ML STRL (SYRINGE) IMPLANT
TOWEL GREEN STERILE (TOWEL DISPOSABLE) ×4 IMPLANT
TRANSDUCER W/STOPCOCK (MISCELLANEOUS) ×4 IMPLANT
TRAY FOLEY SLVR 14FR TEMP STAT (SET/KITS/TRAYS/PACK) IMPLANT
TUBE SUCT INTRACARD DLP 20F (MISCELLANEOUS) IMPLANT
WIRE EMERALD 3MM-J .035X150CM (WIRE) ×2 IMPLANT
WIRE EMERALD 3MM-J .035X260CM (WIRE) ×3 IMPLANT
WIRE MICRO SET SILHO 5FR 7 (SHEATH) ×1 IMPLANT

## 2021-06-21 NOTE — Op Note (Signed)
[facility]  TAVR OPERATIVE NOTE   Date of Procedure:  06/21/2021  Preoperative Diagnosis: Severe Aortic Stenosis   Postoperative Diagnosis: Same   Procedure:   Transcatheter Aortic Valve Replacement - Transfemoral Approach  Edwards Sapien 3 Resilia THV (size 23 mm, model # 9755RLS, serial #[id number])   Co-Surgeons:   Gaye Pollack, MD and Lenna Sciara, MD Anesthesiologist:    Echocardiographer:  Adele Barthel, MD  Pre-operative Echo Findings: Severe aortic stenosis Normal left ventricular systolic function  Post-operative Echo Findings: Trace paravalvular leak Normal left ventricular systolic function  BRIEF CLINICAL NOTE AND INDICATIONS FOR SURGERY  The patient is a 71 year old female with severe symptomatic aortic stenosis, coronary artery disease status post LAD stenting, hypertension, hyperlipidemia, type 2 diabetes, and a history of stroke was referred due to severe symptomatic aortic stenosis.  During the course of the patient's preoperative work up they have been evaluated comprehensively by a multidisciplinary team of specialists coordinated through the [facility].  They have been demonstrated to suffer from symptomatic severe aortic stenosis as noted above. The patient has been counseled extensively as to the relative risks and benefits of all options for the treatment of severe aortic stenosis including long term medical therapy, conventional surgery for aortic valve replacement, and transcatheter aortic valve replacement.  The patient has been independently evaluated by Dr. Cyndia Bent with CT surgery and they are felt to be at high risk for conventional surgical aortic valve replacement. The surgeon indicated the patient would be a poor candidate for conventional surgery. Based upon review of all of the patient's preoperative diagnostic tests they are felt to be candidate for transcatheter  aortic valve replacement using the transfemoral approach as an alternative to high risk conventional surgery.    Following the decision to proceed with transcatheter aortic valve replacement, a discussion has been held regarding what types of management strategies would be attempted intraoperatively in the event of life-threatening complications, including whether or not the patient would be considered a candidate for the use of cardiopulmonary bypass and/or conversion to open sternotomy for attempted surgical intervention.  The patient has been advised of a variety of complications that might develop peculiar to this approach including but not limited to risks of death, stroke, paravalvular leak, aortic dissection or other major vascular complications, aortic annulus rupture, device embolization, cardiac rupture or perforation, acute myocardial infarction, arrhythmia, heart block or bradycardia requiring permanent pacemaker placement, congestive heart failure, respiratory failure, renal failure, pneumonia, infection, other late complications related to structural valve deterioration or migration, or other complications that might ultimately cause a temporary or permanent loss of functional independence or other long term morbidity.  The patient provides full informed consent for the procedure as described and all questions were answered preoperatively.    DETAILS OF THE OPERATIVE PROCEDURE  PREPARATION:   The patient is brought to the operating room on the above mentioned date and central monitoring was established by the anesthesia team including placement of a radial arterial line. The patient is placed in the supine position on the operating table.  Intravenous antibiotics are administered. Conscious sedation is used.   Baseline transthoracic echocardiogram was performed. The patient's chest, abdomen, both groins, and both lower extremities are prepared and draped in a sterile manner. A time out  procedure is performed.   PERIPHERAL ACCESS:   Using the modified Seldinger technique, femoral arterial and venous access were obtained with placement of a 6  Fr sheath in the artery and a 7 Fr sheath in the vein on the left side using u/s guidance.  A pigtail diagnostic catheter was passed through the femoral arterial sheath under fluoroscopic guidance into the aortic root.  A temporary transvenous pacemaker catheter was passed through the femoral venous sheath under fluoroscopic guidance into the right ventricle.  The pacemaker was tested to ensure stable lead placement and pacemaker capture. Aortic root angiography was performed in order to determine the optimal angiographic angle for valve deployment.  TRANSFEMORAL ACCESS:  A micropuncture kit was used to gain access to the right femoral artery using u/s guidance. Position confirmed with angiography. Pre-closure with double ProGlide closure devices. The patient was heparinized systemically and ACT verified > 250 seconds.    A 14 Fr transfemoral E-sheath was introduced into the right femoral artery after progressively dilating over an Amplatz superstiff wire. An AL-1 catheter was used to direct a straight-tip exchange length wire across the native aortic valve into the left ventricle. This was exchanged out for a pigtail catheter and position was confirmed in the LV apex. Simultaneous LV and Ao pressures were recorded.  The pigtail catheter was then exchanged for an Amplatz Extra-stiff wire in the LV apex.   TRANSCATHETER HEART VALVE DEPLOYMENT:  An Edwards Sapien 3 THV (size 23 mm) was prepared and crimped per manufacturer's guidelines, and the proper orientation of the valve is confirmed on the [employer] delivery system. The valve was advanced through the introducer sheath using normal technique until in an appropriate position in the abdominal aorta beyond the sheath tip. The balloon was then retracted and using the fine-tuning wheel was  centered on the valve. The valve was then advanced across the aortic arch using appropriate flexion of the catheter. The valve was carefully positioned across the aortic valve annulus. The Commander catheter was retracted using normal technique. Once final position of the valve has been confirmed by angiographic assessment, the valve is deployed while temporarily holding ventilation and during rapid ventricular pacing to maintain systolic blood pressure < 50 mmHg and pulse pressure < 10 mmHg. The balloon inflation is held for >3 seconds after reaching full deployment volume. Once the balloon has fully deflated the balloon is retracted into the ascending aorta and valve function is assessed using TTE. There is felt to be mild paravalvular leak and no central aortic insufficiency.  The patient's hemodynamic recovery following valve deployment is good.  The deployment balloon and guidewire are both removed. Echo demostrated acceptable post-procedural gradients, stable mitral valve function, and  AI.   PROCEDURE COMPLETION:  The sheath was then removed and closure devices were completed. Protamine was administered once femoral arterial repair was complete. The temporary pacemaker, pigtail catheters and femoral sheaths were removed with a Mynx closure device placed in the artery and manual pressure used for venous hemostasis.    The patient tolerated the procedure well and is transported to the surgical intensive care in stable condition. There were no immediate intraoperative complications. All sponge instrument and needle counts are verified correct at completion of the operation.   No blood products were administered during the operation.  The patient received a total of 100 mL of intravenous contrast during the procedure.  Early Osmond MD 06/21/2021 5:53 PM

## 2021-06-21 NOTE — Interval H&P Note (Signed)
History and Physical Interval Note:  06/21/2021 1:51 PM  Joanna [name]  has presented today for surgery, with the diagnosis of Severe Aortic Stenosis.  The various methods of treatment have been discussed with the patient and family. After consideration of risks, benefits and other options for treatment, the patient has consented to  Procedure(s): TRANSCATHETER AORTIC VALVE REPLACEMENT, TRANSFEMORAL (N/A) INTRAOPERATIVE TRANSTHORACIC ECHOCARDIOGRAM (N/A) as a surgical intervention.  The patient's history has been reviewed, patient examined, no change in status, stable for surgery.  I have reviewed the patient's chart and labs.  Questions were answered to the patient's satisfaction.     Gaye Pollack

## 2021-06-21 NOTE — Anesthesia Procedure Notes (Signed)
Arterial Line Insertion Start/End1/31/2023 1:45 PM Performed by: Janace Litten, CRNA, CRNA  Preanesthetic checklist: IV checked and monitors and equipment checked Lidocaine 1% used for infiltration Left, radial was placed Catheter size: 20 G Hand hygiene performed  and maximum sterile barriers used   Attempts: 1 Procedure performed without using ultrasound guided technique. Following insertion, dressing applied and Biopatch. Post procedure assessment: normal  Patient tolerated the procedure well with no immediate complications.

## 2021-06-21 NOTE — Anesthesia Preprocedure Evaluation (Addendum)
Anesthesia Evaluation  Patient identified by MRN, date of birth, ID band Patient awake    Reviewed: Allergy & Precautions, NPO status , Patient's Chart, lab work & pertinent test results  Airway Mallampati: II  TM Distance: >3 FB Neck ROM: Full    Dental no notable dental hx.    Pulmonary sleep apnea and Continuous Positive Airway Pressure Ventilation ,    Pulmonary exam normal breath sounds clear to auscultation       Cardiovascular hypertension, Pt. on medications and Pt. on home beta blockers + CAD and + Cardiac Stents  + Valvular Problems/Murmurs AS  Rhythm:Regular Rate:Normal + Systolic murmurs ECHO: Left ventricular ejection fraction, by estimation, is 60 to 65%. The left ventricle has normal function. The left ventricle has no regional wall motion abnormalities. There is mild concentric left ventricular hypertrophy. Left ventricular diastolic parameters are consistent with Grade I diastolic dysfunction (impaired relaxation). Elevated left ventricular end-diastolic pressure. The average left ventricular global longitudinal strain is -19.8 %. The global longitudinal strain is normal. Right ventricular systolic function is normal. The right ventricular size is normal. There is normal pulmonary artery systolic pressure. The mitral valve is normal in structure. Trivial mitral valve regurgitation. No evidence of mitral stenosis. Moderate mitral annular calcification. The aortic valve is calcified. There is severe calcifcation of the aortic valve. There is severe thickening of the aortic valve. Aortic valve regurgitation is trivial. Severe aortic valve stenosis. The inferior vena cava is normal in size with greater than 50% respiratory variability, suggesting right atrial pressure of 3 mmHg.   Neuro/Psych Anxiety CVA, Residual Symptoms    GI/Hepatic negative GI ROS, Neg liver ROS,   Endo/Other  diabetes, Oral Hypoglycemic  AgentsHypothyroidism   Renal/GU negative Renal ROS     Musculoskeletal negative musculoskeletal ROS (+)   Abdominal   Peds  Hematology negative hematology ROS (+)   Anesthesia Other Findings Severe Aortic Stenosis  Reproductive/Obstetrics                            Anesthesia Physical Anesthesia Plan  ASA: 4  Anesthesia Plan: MAC   Post-op Pain Management:    Induction: Intravenous  PONV Risk Score and Plan: 2 and Ondansetron, Dexamethasone and Treatment may vary due to age or medical condition  Airway Management Planned: Simple Face Mask  Additional Equipment: Arterial line  Intra-op Plan:   Post-operative Plan:   Informed Consent: I have reviewed the patients History and Physical, chart, labs and discussed the procedure including the risks, benefits and alternatives for the proposed anesthesia with the patient or authorized representative who has indicated his/her understanding and acceptance.     Dental advisory given  Plan Discussed with: CRNA  Anesthesia Plan Comments:         Anesthesia Quick Evaluation

## 2021-06-21 NOTE — Anesthesia Procedure Notes (Signed)
Procedure Name: MAC Date/Time: 06/21/2021 3:30 PM Performed by: Janace Litten, CRNA Pre-anesthesia Checklist: Patient identified, Emergency Drugs available, Suction available and Patient being monitored Patient Re-evaluated:Patient Re-evaluated prior to induction Oxygen Delivery Method: Simple face mask

## 2021-06-21 NOTE — Interval H&P Note (Signed)
History and Physical Interval Note:  06/21/2021 1:48 PM  [name]  has presented today for surgery, with the diagnosis of Severe Aortic Stenosis.  The various methods of treatment have been discussed with the patient and family. After consideration of risks, benefits and other options for treatment, the patient has consented to  Procedure(s): TRANSCATHETER AORTIC VALVE REPLACEMENT, TRANSFEMORAL (N/A) INTRAOPERATIVE TRANSTHORACIC ECHOCARDIOGRAM (N/A) as a surgical intervention.  The patient's history has been reviewed, patient examined, no change in status, stable for surgery.  I have reviewed the patient's chart and labs.  Questions were answered to the patient's satisfaction.     Gaye Pollack

## 2021-06-21 NOTE — Anesthesia Postprocedure Evaluation (Signed)
Anesthesia Post Note  Patient: Joanna [name]  Procedure(s) Performed: TRANSCATHETER AORTIC VALVE REPLACEMENT, TRANSFEMORAL USING A 23 MM EDWARDS SAPIEN 3 ULTRA  AORTIC VALVE INTRAOPERATIVE TRANSTHORACIC ECHOCARDIOGRAM     Patient location during evaluation: Other (OR due to [place]) Anesthesia Type: MAC Level of consciousness: awake Pain management: pain level controlled Vital Signs Assessment: post-procedure vital signs reviewed and stable Respiratory status: spontaneous breathing, nonlabored ventilation, respiratory function stable and patient connected to nasal cannula oxygen Cardiovascular status: stable and blood pressure returned to baseline Postop Assessment: no apparent nausea or vomiting Anesthetic complications: no Comments: Patient recovered in OR 16 and then transferred to 4th floor due to COVID positivity.    No notable events documented.  Last Vitals:  Vitals:   06/21/21 2000 06/21/21 2100  BP: 109/72 117/72  Pulse: 62 63  Resp: 13 13  Temp: 36.4 C 36.4 C  SpO2: 96% 97%    Last Pain:  Vitals:   06/21/21 2100  TempSrc: Oral  PainSc: 0-No pain                 Nyssa Sayegh P Maxine Huynh

## 2021-06-21 NOTE — Progress Notes (Signed)
Patient arrived from OR to 4e09, patient placed on monitor, vital signs obtained an CHG bath complete. CCMD made aware patient on box [id number], Patient arrived with right radial A-line , B groins level 0. EKG obtained. Dr. Ali Lowe in room at bedside stated to DC A line after bed rest completed. Primary RN at bedside. Bruchy Mikel, Bettina Gavia RN

## 2021-06-21 NOTE — Transfer of Care (Signed)
Immediate Anesthesia Transfer of Care Note  Patient: Joanna [name]  Procedure(s) Performed: TRANSCATHETER AORTIC VALVE REPLACEMENT, TRANSFEMORAL USING A 23 MM EDWARDS SAPIEN 3 ULTRA  AORTIC VALVE INTRAOPERATIVE TRANSTHORACIC ECHOCARDIOGRAM  Patient Location: Nursing Unit  Anesthesia Type:MAC  Level of Consciousness: drowsy, patient cooperative and responds to stimulation  Airway & Oxygen Therapy: Patient Spontanous Breathing  Post-op Assessment: Report given to RN and Post -op Vital signs reviewed and stable  Post vital signs: Reviewed and stable  Last Vitals:  Vitals Value Taken Time  BP 106/69 06/21/21 1803  Temp    Pulse 76 06/21/21 1807  Resp 14 06/21/21 1807  SpO2 96 % 06/21/21 1807  Vitals shown include unvalidated device data.  Last Pain:  Vitals:   06/21/21 1225  TempSrc:   PainSc: 0-No pain         Complications: No notable events documented.

## 2021-06-22 ENCOUNTER — Inpatient Hospital Stay (HOSPITAL_COMMUNITY): Payer: Medicare Other

## 2021-06-22 ENCOUNTER — Encounter (HOSPITAL_COMMUNITY): Payer: Self-pay | Admitting: Internal Medicine

## 2021-06-22 DIAGNOSIS — H532 Diplopia: Secondary | ICD-10-CM

## 2021-06-22 DIAGNOSIS — E039 Hypothyroidism, unspecified: Secondary | ICD-10-CM | POA: Diagnosis not present

## 2021-06-22 DIAGNOSIS — I639 Cerebral infarction, unspecified: Secondary | ICD-10-CM

## 2021-06-22 DIAGNOSIS — I350 Nonrheumatic aortic (valve) stenosis: Secondary | ICD-10-CM

## 2021-06-22 DIAGNOSIS — Z952 Presence of prosthetic heart valve: Secondary | ICD-10-CM | POA: Diagnosis not present

## 2021-06-22 DIAGNOSIS — I10 Essential (primary) hypertension: Secondary | ICD-10-CM

## 2021-06-22 DIAGNOSIS — Z954 Presence of other heart-valve replacement: Secondary | ICD-10-CM

## 2021-06-22 DIAGNOSIS — Z006 Encounter for examination for normal comparison and control in clinical research program: Secondary | ICD-10-CM | POA: Diagnosis not present

## 2021-06-22 DIAGNOSIS — U071 COVID-19: Secondary | ICD-10-CM | POA: Diagnosis not present

## 2021-06-22 LAB — CBC
HCT: 36.1 % (ref 36.0–46.0)
Hemoglobin: 12.3 g/dL (ref 12.0–15.0)
MCH: 29.4 pg (ref 26.0–34.0)
MCHC: 34.1 g/dL (ref 30.0–36.0)
MCV: 86.4 fL (ref 80.0–100.0)
Platelets: 193 10*3/uL (ref 150–400)
RBC: 4.18 MIL/uL (ref 3.87–5.11)
RDW: 13.8 % (ref 11.5–15.5)
WBC: 7.1 10*3/uL (ref 4.0–10.5)
nRBC: 0 % (ref 0.0–0.2)

## 2021-06-22 LAB — ECHOCARDIOGRAM COMPLETE
AV Mean grad: 12 mmHg
AV Peak grad: 21.3 mmHg
Ao pk vel: 2.31 m/s
Area-P 1/2: 3.89 cm2
Height: 63 in
S' Lateral: 2.2 cm
Weight: 2430.4 oz

## 2021-06-22 LAB — COMPREHENSIVE METABOLIC PANEL
ALT: 26 U/L (ref 0–44)
AST: 26 U/L (ref 15–41)
Albumin: 3.7 g/dL (ref 3.5–5.0)
Alkaline Phosphatase: 59 U/L (ref 38–126)
Anion gap: 10 (ref 5–15)
BUN: 13 mg/dL (ref 8–23)
CO2: 24 mmol/L (ref 22–32)
Calcium: 9.3 mg/dL (ref 8.9–10.3)
Chloride: 103 mmol/L (ref 98–111)
Creatinine, Ser: 0.56 mg/dL (ref 0.44–1.00)
GFR, Estimated: >60 mL/min (ref >60)
Glucose, Bld: 130 mg/dL — ABNORMAL HIGH (ref 70–99)
Potassium: 3.7 mmol/L (ref 3.5–5.1)
Sodium: 137 mmol/L (ref 135–145)
Total Bilirubin: 1 mg/dL (ref 0.3–1.2)
Total Protein: 6.8 g/dL (ref 6.5–8.1)

## 2021-06-22 LAB — GLUCOSE, CAPILLARY
Glucose-Capillary: 144 mg/dL — ABNORMAL HIGH (ref 70–99)
Glucose-Capillary: 145 mg/dL — ABNORMAL HIGH (ref 70–99)
Glucose-Capillary: 154 mg/dL — ABNORMAL HIGH (ref 70–99)
Glucose-Capillary: 170 mg/dL — ABNORMAL HIGH (ref 70–99)
Glucose-Capillary: 177 mg/dL — ABNORMAL HIGH (ref 70–99)

## 2021-06-22 MED ORDER — RAMIPRIL 5 MG PO CAPS
10.0000 mg | ORAL_CAPSULE | Freq: Every day | ORAL | Status: DC
  Administered 2021-06-22 – 2021-06-23 (×2): 10 mg via ORAL
  Filled 2021-06-22 (×2): qty 2

## 2021-06-22 MED ORDER — AMLODIPINE BESYLATE 5 MG PO TABS
5.0000 mg | ORAL_TABLET | Freq: Every day | ORAL | Status: DC
  Administered 2021-06-22 – 2021-06-23 (×2): 5 mg via ORAL
  Filled 2021-06-22 (×2): qty 1

## 2021-06-22 MED ORDER — CARVEDILOL 6.25 MG PO TABS
6.2500 mg | ORAL_TABLET | Freq: Two times a day (BID) | ORAL | Status: DC
  Administered 2021-06-22 – 2021-06-23 (×2): 6.25 mg via ORAL
  Filled 2021-06-22 (×2): qty 1

## 2021-06-22 NOTE — Progress Notes (Signed)
Echocardiogram 2D Echocardiogram has been performed.  Joanna [name] 06/22/2021, 11:06 AM

## 2021-06-22 NOTE — Progress Notes (Signed)
Mobility Specialist: Progress Note   06/22/21 1225  Mobility  Activity Ambulated with assistance in room  Level of Assistance Contact guard assist, steadying assist  Assistive Device Front wheel walker  Distance Ambulated (ft) 84 ft  Activity Response Tolerated fair  $Mobility charge 1 Mobility   Post-Mobility: 81 HR, 155/88 BP  Pt c/o continuous double vision and dizziness during ambulation. Lateral lean when turning during ambulation but was able to self correct, no overt LOB. Pt to BR and then back to bed with call bell in reach and family present in the room.   [facility] Niklas Chretien Mobility Specialist Mobility Specialist [address] [place]: [phone] Mobility Specialist 2 [place]: [phone]

## 2021-06-22 NOTE — Plan of Care (Signed)
  Problem: Clinical Measurements: Goal: Will remain free from infection Outcome: Progressing Goal: Respiratory complications will improve Outcome: Progressing Goal: Cardiovascular complication will be avoided Outcome: Progressing   

## 2021-06-22 NOTE — Progress Notes (Addendum)
[facility]VE TEAM  Patient Name: Joanna [name] Date of Encounter: 06/22/2021  Primary Cardiologist: Dr. Marlou Porch, MD/ Dr. Ali Lowe, MD and Dr. Cyndia Bent, MD (TAVR)  Hospital Problem List     Principal Problem:   S/P TAVR (transcatheter aortic valve replacement) Active Problems:   Hyperlipidemia LDL goal <70   Essential hypertension   Obstructive sleep apnea   Uncontrolled type 2 diabetes mellitus with hyperglycemia, without long-term current use of insulin ([place])   Status post angioplasty with stent   Aortic stenosis   History of right thalamic stroke in 2017   Diplopia   Subjective   Pt reports new visual changes and dizziness with ambulation after TAVR after TAVR yesterday afternoon.   Inpatient Medications    Scheduled Meds:  amLODipine  5 mg Oral Daily   aspirin  81 mg Oral Daily   carvedilol  6.25 mg Oral BID   insulin aspart  0-24 Units Subcutaneous Q4H   levothyroxine  125 mcg Oral Once per day on Mon Tue Wed Thu Fri Sat   [START ON 06/26/2021] levothyroxine  62.5 mcg Oral Once per day on Sun   magnesium oxide  400 mg Oral BID   ramipril  10 mg Oral Daily   rosuvastatin  2.5 mg Oral Daily   sodium chloride flush  3 mL Intravenous Q12H   Continuous Infusions:  sodium chloride     sodium chloride     nitroGLYCERIN     PRN Meds: sodium chloride, acetaminophen **OR** acetaminophen, morphine injection, ondansetron (ZOFRAN) IV, oxyCODONE, sodium chloride flush, traMADol   Vital Signs    Vitals:   06/22/21 0800 06/22/21 0805 06/22/21 0809 06/22/21 0909  BP:    (!) 151/92  Pulse: (!) 0 (!) 0 (!) 0 76  Resp:    15  Temp:    98.4 F (36.9 C)  TempSrc:    Oral  SpO2:    97%  Weight:      Height:        Intake/Output Summary (Last 24 hours) at 06/22/2021 1030 Last data filed at 06/22/2021 0554 Gross per 24 hour  Intake 2885.72 ml  Output 400 ml  Net 2485.72 ml   Filed Weights   06/21/21 1208 06/22/21 0500  Weight:  68 kg 68.9 kg   Physical Exam    General: Well developed, well nourished, NAD Neck: Negative for carotid bruits. No JVD Lungs:Clear to ausculation bilaterally. Breathing is unlabored. Cardiovascular: RRR with S1 S2. Soft flow murmur Abdomen: Soft, non-tender, non-distended. No obvious abdominal masses. MSK: Strength and ton, equal on both sides. 5/5 in all extremities Extremities: No edema.Groin sites stable with no hematoma or bleeding.  Neuro: Alert and oriented. No focal deficits. No facial asymmetry. MAE spontaneously. Reports diplopia. No nystagmus  Psych: Responds to questions appropriately with normal affect.    Labs    CBC Recent Labs    06/20/21 0843 06/22/21 0608  WBC 6.8 7.1  HGB 13.1 12.3  HCT 40.2 36.1  MCV 88.9 86.4  PLT 220 628   Basic Metabolic Panel Recent Labs    06/20/21 0843 06/22/21 0608  NA 138 137  K 4.2 3.7  CL 104 103  CO2 25 24  GLUCOSE 156* 130*  BUN 16 13  CREATININE 0.54 0.56  CALCIUM 10.0 9.3   Liver Function Tests Recent Labs    06/20/21 0843 06/22/21 0608  AST 22 26  ALT 30 26  ALKPHOS 68 59  BILITOT 0.7 1.0  PROT 7.7 6.8  ALBUMIN 4.3 3.7   No results for input(s): LIPASE, AMYLASE in the last 72 hours. Cardiac Enzymes No results for input(s): CKTOTAL, CKMB, CKMBINDEX, TROPONINI in the last 72 hours. BNP Invalid input(s): POCBNP D-Dimer No results for input(s): DDIMER in the last 72 hours. Hemoglobin A1C No results for input(s): HGBA1C in the last 72 hours. Fasting Lipid Panel No results for input(s): CHOL, HDL, LDLCALC, TRIG, CHOLHDL, LDLDIRECT in the last 72 hours. Thyroid Function Tests No results for input(s): TSH, T4TOTAL, T3FREE, THYROIDAB in the last 72 hours.  Invalid input(s): FREET3  Telemetry    06/22/21 NSR with HR 70s. IVCD resolved from post procedure EKG 06/21/21  - Personally Reviewed  ECG    06/22/21 NSR with HR 70bpm and TW in inferior and lateral leads (no change from prior), IVCD resolved from  post procedure EKG 06/21/21 - Personally Reviewed  Radiology    CT HEAD WO CONTRAST (5MM)  Result Date: 06/22/2021 CLINICAL DATA:  Diplopia EXAM: CT HEAD WITHOUT CONTRAST TECHNIQUE: Contiguous axial images were obtained from the base of the skull through the vertex without intravenous contrast. RADIATION DOSE REDUCTION: This exam was performed according to the departmental dose-optimization program which includes automated exposure control, adjustment of the mA and/or kV according to patient size and/or use of iterative reconstruction technique. COMPARISON:  CT head 05/08/2021, brain MRI 05/10/2016 FINDINGS: Brain: There is no evidence of acute intracranial hemorrhage, extra-axial fluid collection, or acute infarct. Parenchymal volume is normal. The ventricles are normal in size. Gray-white differentiation is preserved. A remote lacunar infarct in the right thalamus is unchanged. There is no mass lesion.  There is no midline shift. Vascular: There is calcification of the bilateral cavernous ICAs. Skull: Normal. Negative for fracture or focal lesion. Sinuses/Orbits: There is minimal mucosal thickening in the right sphenoid sinus. Bilateral lens implants are in place. The globes and orbits are otherwise unremarkable. Other: None. IMPRESSION: No acute intracranial pathology. Electronically Signed   By: Valetta Mole M.D.   On: 06/22/2021 10:03   Structural Heart Procedure  Result Date: 06/21/2021 See surgical note for result.   Cardiac Studies   TAVR OPERATIVE NOTE     Date of Procedure:                06/21/2021   Preoperative Diagnosis:      Severe Aortic Stenosis    Postoperative Diagnosis:    Same    Procedure:        Transcatheter Aortic Valve Replacement - Transfemoral Approach             Edwards Sapien 3 Resilia THV (size 23 mm, model # 9755RLS, serial #[id number])              Co-Surgeons:                         Gaye Pollack, MD and Lenna Sciara, MD Anesthesiologist:                      Echocardiographer:              Adele Barthel, MD   Pre-operative Echo Findings: Severe aortic stenosis Normal left ventricular systolic function   Post-operative Echo Findings: Trace paravalvular leak Normal left ventricular systolic function  Echocardiogram 06/22/21: Pending   Patient Profile   Joanna [name] is a 71yo F with a hx of HTN, HLD, DMT2, CVA 2017 without  residual deficits, CAD s/p DES to mid/dist LAD (2019), OSA on CPAP and  critical AS.  Assessment & Plan   Severe AS: s/p successful TAVR with a 23 mm Edwards Sapien 3SU THV via the TF approach on 06/21/21. Post operative echo pending. Groin sites are stable with no s/s bleeding or hematoma.  ECG with NSR and no high grade heart block. Continue ASA monotherapy.   New diplopia with hx of thalamic CVA 2017: Reports new diplopia since TAVR 06/21/21 along with dizziness with ambulation. No other neuro changes however concerning given hx of prior CVA from 2017. Will obtain head CT today. Will reach out to neurology team for their assistance as well.   HTN: Elevated above goal today. Restart PTA antihypertensives.   DM2: SSI for glucose control while inpatient status. Last Hb A1c, 6.2 from 05/26/21  CAD s/p DES to mLAD/dLAD (2019): Denies anginal symptoms. Continue ASA, statin   HLD: Last LDL, 113>>above goal of less than 70 however noted to have severe intolerance to statins. Unclear if she has trialed PCSK9-inhibitors.   Incidental findings: None   Signed, Kathyrn Drown, NP  06/22/2021, 10:30 AM  Pager [phone]    Chart reviewed, patient examined, agree with above. She has been hemodynamically stable in sinus rhythm with no heart block. She reports double vision since waking up from the procedure yesterday that has not improved. She is ok with one eye closed. She had a previous right thalamic stroke in 2017. She has not other neuro changes to my exam. I am concerned about the possibility of a posterior circulation stoke. Head  CT today negative but may need MRI. Will consult neurology.

## 2021-06-22 NOTE — Consult Note (Signed)
Neurology Consult H&P  Joanna [name] MR# [id number] 06/22/2021  CC: double vision  History is obtained from: patient, husband and chart.  HPI: Joanna [name] is a 71 y.o. female PMHx as reviewed below s/p TAVR after procedure noted blurry vision which improved with closing either left or right eye which then progressed to double vision and sensation of dizziness.   She has never had these symptoms before.  LKW: unknown tNK given: No OSW IR Thrombectomy No, not candidate Modified Rankin Scale: 0-Completely asymptomatic and back to baseline post- stroke NIHSS: 0  ROS: A complete ROS was performed and is negative except as noted in the HPI.   Past Medical History:  Diagnosis Date   Abnormal liver function tests    Adenomatous colon polyp    CAD (coronary artery disease) 05/09/2018   a. LHC 05/09/18: DES to mid/dist LAD, mod AS   Cerebrovascular disease    a. CT 04/2016 -calcific plaque at the origin LEFT vertebral contributing to severe stenosis.   Diverticulitis of colon    Fatty liver    a. mild fatty liver infiltration by CT 2014.   Hx of renal calculi    LEFT   Hyperlipidemia    Hypertension    Hypothyroidism    pt states hx of thyroid nodules   IBS (irritable bowel syndrome)    Internal hemorrhoids    Melanoma ([place]) 1980s   mid upper stomach (05/08/2018)   OSA on CPAP     SETTING IS 14 (05/08/2018)   S/P TAVR (transcatheter aortic valve replacement) 06/21/2021   Edwards 70m S3UR via TF approach with Dr. TAli Loweand Dr. BCyndia Bent  Statin intolerance    Stroke ([facility]    a. right thalamic infarct in 04/2016 felt by neuro to be due to small vessel disease.   Type II diabetes mellitus ([place]      Family History  Problem Relation Age of Onset   Hyperlipidemia Mother    Neurofibromatosis Father    Thyroid disease Daughter    Hyperlipidemia Sister    Hyperlipidemia Brother    Diabetes Neg Hx    Stroke Neg Hx    Colon cancer Neg Hx    Esophageal cancer Neg Hx     Pancreatic cancer Neg Hx    Liver cancer Neg Hx    Social History:  reports that she has never smoked. She has never used smokeless tobacco. She reports that she does not drink alcohol and does not use drugs.  Prior to Admission medications   Medication Sig Start Date End Date Taking? Authorizing Provider  amLODipine (NORVASC) 10 MG tablet TAKE 1/2 TABLET BY MOUTH EVERY DAY 04/20/21  Yes SJerline Pain MD  aspirin EC 81 MG tablet Take 1 tablet (81 mg total) by mouth daily. 05/11/16  Yes CDessa Phi DO  Blood Glucose Monitoring Suppl (ONETOUCH VERIO) w/Device KIT by Does not apply route. Use to check blood sugars once daily.   Yes [provider]  carvedilol (COREG) 6.25 MG tablet TAKE 1 TABLET BY MOUTH TWICE A DAY 04/20/21  Yes SJerline Pain MD  glimepiride (AMARYL) 1 MG tablet TAKE 1 TABLET BY MOUTH EVERYDAY AT BEDTIME 03/07/21  Yes KElayne Snare MD  levothyroxine (SYNTHROID) 125 MCG tablet 1 tab daily except 1/2 on Sundays Patient taking differently: Take 125 mcg by mouth See admin instructions. Mon - Sat Only 06/07/21  Yes KElayne Snare MD  Magnesium 500 MG TABS Take 500 mg by mouth 2 (two) times  daily.   Yes [provider]  metFORMIN (GLUCOPHAGE) 1000 MG tablet TAKE 1 TABLET BY MOUTH TWICE A DAY 04/20/21  Yes Elayne Snare, MD  nitroGLYCERIN (NITROSTAT) 0.4 MG SL tablet Place 1 tablet (0.4 mg total) under the tongue every 5 (five) minutes x 3 doses as needed for chest pain. 05/10/18  Yes Daune Perch, NP  ramipril (ALTACE) 10 MG capsule TAKE 1 CAPSULE BY MOUTH EVERY DAY 04/11/21  Yes Jerline Pain, MD  rosuvastatin (CRESTOR) 5 MG tablet TAKE 1/2 TABLET BY MOUTH DAILY Patient taking differently: Take 1.25 mg by mouth daily. 06/04/20  Yes Jerline Pain, MD  glucose blood (ONE TOUCH ULTRA TEST) test strip USE TO CHECK BLOOD SUGAR TWICE DAILY  Dx:E11.65 10/22/17   Elayne Snare, MD  Lancets [facility] DELICA PLUS [id number]) MISC Use to test blood sugars once daily. 01/01/20    Elayne Snare, MD  OneTouch Delica Lancets 49S MISC by Does not apply route. Use to test blood sugars once daily    [provider]   Exam: Current vital signs: BP (!) 153/82 (BP Location: Right Arm)    Pulse 85    Temp 97.9 F (36.6 C) (Oral)    Resp 17    Ht 5' 3"  (1.6 m)    Wt 68.9 kg    SpO2 98%    BMI 26.91 kg/m   Physical Exam  Constitutional: Appears well-developed and well-nourished.  Psych: Affect appropriate to situation Eyes: No scleral injection HENT: No OP obstruction. Head: Normocephalic.  Cardiovascular: Normal rate and regular rhythm.  Respiratory: Effort normal, symmetric excursions bilaterally, no audible wheezing. GI: Soft.  No distension. There is no tenderness.  Skin: WDI  Neuro: Mental Status: Patient is awake, alert, oriented to person, place, month, year, and situation. Patient is able to give a clear and coherent history. Speech fluent, intact comprehension and repetition. No signs of aphasia or neglect. Visual Fields are full. Pupils are equal, round, and reactive to light. Right nystagmus. Diplopia in the diagonal plane worsening with left gaze with left image above. Facial sensation is symmetric to temperature. Facial movement is symmetric.  Hearing is intact to voice. Uvula midline and palate elevates symmetrically. Shoulder shrug is symmetric. Tongue is midline without atrophy or fasciculations.  Tone is normal. Bulk is normal. 5/5 strength was present in all four extremities. Sensation is symmetric to light touch and temperature in the arms and legs. Deep Tendon Reflexes: 2+ and symmetric in the biceps and patellae. Toes are downgoing bilaterally. FNF and HKS are intact bilaterally. Gait - Deferred  I have reviewed labs in epic and the pertinent results are: LDL 118  I have reviewed the images obtained: NCT head showed no acute intracranial pathology.  Assessment: Joanna [name] is a 71 y.o. female PMHx as noted above s/p TAVR with  post procedural diplopia in the diagonal plane worsening with left gaze with left image above with right nystagmus and she will need further imaging to rule out stroke in low midbrain/pons.    Plan: - MRI brain without contrast.  If positive she will need further stroke workup: - MRA head and neck. - Recommend labs: HbA1c, lipid panel. - Recommend Statin if LDL > 70 - Aspirin 35m daily. - Clopidogrel 733mdaily for 3 weeks. - SBP goal <160 for now. - Telemetry monitoring for arrhythmia. - Recommend bedside Swallow screen. - Recommend Stroke education. - Recommend PT/OT/SLP consult.  - Neurology will continue to follow.   Electronically signed by:  Lynnae Sandhoff, MD Page: [phone] 06/22/2021, 6:49 PM  If 7pm- 7am, please page neurology on call as listed in [place].

## 2021-06-22 NOTE — Op Note (Signed)
[facility]   MULTIDISCIPLINARY HEART VALVE TEAM   TAVR OPERATIVE NOTE   Date of Procedure:  06/21/2021  Preoperative Diagnosis: Severe Aortic Stenosis   Postoperative Diagnosis: Same   Procedure:   Transcatheter Aortic Valve Replacement - Percutaneous Right Transfemoral Approach  Edwards Sapien 3 Ultra Resilia THV (size 23 mm, model # 9755RSL, serial # [id number])   Co-Surgeons:  Gaye Pollack, MD and Lenna Sciara, MD   Anesthesiologist:  Perfecto Kingdom, MD  Echocardiographer:  P. Johnsie Cancel, MD  Pre-operative Echo Findings: Severe aortic stenosis Normal left ventricular systolic function  Post-operative Echo Findings: Trace paravalvular leak Normal left ventricular systolic function   BRIEF CLINICAL NOTE AND INDICATIONS FOR SURGERY    This 71 year old woman has stage D, severe, symptomatic aortic stenosis with New York Heart Association class II symptoms of exertional fatigue and shortness of breath consistent with chronic diastolic congestive heart failure.  She has recently started having some episodes of dizziness as well as swelling in her ankles.  I have personally reviewed her 2D echocardiogram, cardiac catheterization, and CTA studies.  Her echo from November 2022 showed a severely calcified and thickened aortic valve with restricted leaflet mobility.  The mean gradient was 54 mmHg with a peak gradient of 81.4 mmHg and a valve area of 0.58 cm consistent with critical aortic stenosis.  There is trivial aortic insufficiency.  Left ventricular ejection fraction was 60 to 65% with grade 1 diastolic dysfunction.  Cardiac catheterization showed a patent stent in the mid LAD, a 70% ostial second diagonal stenosis, and otherwise mild to moderate nonobstructive coronary disease which can be treated medically.  I agree that aortic valve replacement is indicated in this patient for relief of her symptoms and to prevent progressive left ventricular deterioration.  She is 71  but has multiple comorbidities that may increase her risk with open surgical aortic valve replacement.  Therefore I think transcatheter aortic valve replacement is a reasonable alternative.  Her gated cardiac CTA shows anatomy suitable for TAVR using a SAPIEN 3 valve.  Her abdominal and pelvic CTA shows adequate pelvic vascular anatomy to allow transfemoral insertion.   The patient and her husband were counseled at length regarding treatment alternatives for management of severe symptomatic aortic stenosis. The risks and benefits of surgical intervention has been discussed in detail. Long-term prognosis with medical therapy was discussed. Alternative approaches such as conventional surgical aortic valve replacement, transcatheter aortic valve replacement, and palliative medical therapy were compared and contrasted at length. This discussion was placed in the context of the patient's own specific clinical presentation and past medical history. All of their questions have been addressed.    Following the decision to proceed with transcatheter aortic valve replacement, a discussion was held regarding what types of management strategies would be attempted intraoperatively in the event of life-threatening complications, including whether or not the patient would be considered a candidate for the use of cardiopulmonary bypass and/or conversion to open sternotomy for attempted surgical intervention.  She is certainly a candidate for emergent sternotomy to manage any intraoperative complications.  The patient is aware of the fact that transient use of cardiopulmonary bypass may be necessary. The patient has been advised of a variety of complications that might develop including but not limited to risks of death, stroke, paravalvular leak, aortic dissection or other major vascular complications, aortic annulus rupture, device embolization, cardiac rupture or perforation, mitral regurgitation, acute myocardial infarction,  arrhythmia, heart block or bradycardia requiring permanent pacemaker placement, congestive  heart failure, respiratory failure, renal failure, pneumonia, infection, other late complications related to structural valve deterioration or migration, or other complications that might ultimately cause a temporary or permanent loss of functional independence or other long term morbidity. The patient provides full informed consent for the procedure as described and all questions were answered.     DETAILS OF THE OPERATIVE PROCEDURE  PREPARATION:    The patient was brought to the operating room on the above mentioned date and appropriate monitoring was established by the anesthesia team. The patient was placed in the supine position on the operating table.  Intravenous antibiotics were administered. The patient was monitored closely throughout the procedure under conscious sedation.    Baseline transthoracic echocardiogram was performed. The patient's abdomen and both groins were prepped and draped in a sterile manner. A time out procedure was performed.   PERIPHERAL ACCESS:    Using the modified Seldinger technique, femoral arterial and venous access was obtained with placement of 6 Fr sheaths on the left side.  A pigtail diagnostic catheter was passed through the left arterial sheath under fluoroscopic guidance into the aortic root.  A temporary transvenous pacemaker catheter was passed through the left femoral venous sheath under fluoroscopic guidance into the right ventricle.  The pacemaker was tested to ensure stable lead placement and pacemaker capture. Aortic root angiography was performed in order to determine the optimal angiographic angle for valve deployment.   TRANSFEMORAL ACCESS:   Percutaneous transfemoral access and sheath placement was performed using ultrasound guidance.  The right common femoral artery was cannulated using a micropuncture needle and appropriate location was verified using  hand injection angiogram.  A pair of Abbott Perclose percutaneous closure devices were placed and a 6 French sheath replaced into the femoral artery.  The patient was heparinized systemically and ACT verified > 250 seconds.    A 14 Fr transfemoral E-sheath was introduced into the right common femoral artery after progressively dilating over an Amplatz superstiff wire. An AL-1 catheter was used to direct a straight-tip exchange length wire across the native aortic valve into the left ventricle. This was exchanged out for a pigtail catheter and position was confirmed in the LV apex. Simultaneous LV and Ao pressures were recorded.  The pigtail catheter was exchanged for a Safari wire in the LV apex.    BALLOON AORTIC VALVULOPLASTY:   Not performed.   TRANSCATHETER HEART VALVE DEPLOYMENT:   An Edwards Sapien 3 Ultra transcatheter heart valve (size 23 mm) was prepared and crimped per manufacturer's guidelines, and the proper orientation of the valve is confirmed on the [employer] delivery system. The valve was advanced through the introducer sheath using normal technique until in an appropriate position in the abdominal aorta beyond the sheath tip. The balloon was then retracted and using the fine-tuning wheel was centered on the valve. The valve was then advanced across the aortic arch using appropriate flexion of the catheter. The valve was carefully positioned across the aortic valve annulus. The Commander catheter was retracted using normal technique. Once final position of the valve has been confirmed by angiographic assessment, the valve is deployed while temporarily holding ventilation and during rapid ventricular pacing to maintain systolic blood pressure < 50 mmHg and pulse pressure < 10 mmHg. The balloon inflation is held for >3 seconds after reaching full deployment volume. Once the balloon has fully deflated the balloon is retracted into the ascending aorta and valve function is assessed  using echocardiography. There is felt to be  trace paravalvular leak and no central aortic insufficiency.  The patient's hemodynamic recovery following valve deployment is good.  The deployment balloon and guidewire are both removed.    PROCEDURE COMPLETION:   The sheath was removed and femoral artery closure performed.  Protamine was administered once femoral arterial repair was complete. The temporary pacemaker, pigtail catheters and femoral sheaths were removed with manual pressure used for hemostasis.  A Mynx femoral closure device was utilized following removal of the diagnostic sheath in the left femoral artery.  The patient tolerated the procedure well and is transported to the cath lab recovery area in stable condition. There were no immediate intraoperative complications. All sponge instrument and needle counts are verified correct at completion of the operation.   No blood products were administered during the operation.  The patient received a total of 100 mL of intravenous contrast during the procedure.   Gaye Pollack, MD 06/21/2021

## 2021-06-22 NOTE — Progress Notes (Signed)
Pt is alert and fully oriented. She is hemodynamically stable. NSR on the monitor, afebrile, no distress noted. Left and right groin gauze dressing is dry and clean, negative for drainage or hematoma. Pt ambulated well to the bathroom with one standby assistance.   Pt has complaints of new onset of both eyes dryness and difficulty to focus. Pt normally wears reading glasses, but she did not bring her glasses to the hospital. Assessment: Pt has normal peripheral vision. Neurological intact. Denies headache. Pt also has complaint of burning with urination. Stated the symptom of burning and frequent of urination, symptoms started yesterday in PACU. We will hand off to the next morning round provider. Continue to monitor.  Kennyth Lose, RN

## 2021-06-22 NOTE — Progress Notes (Signed)
Patient complaining of R hand PIV hurting. Flushes well. RN removed PIV at patient's request. Patient still has a patent R AC PIV

## 2021-06-22 NOTE — Progress Notes (Signed)
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 Head CT without acute changes. Appreciate neurology input.   Kathyrn Drown NP-C Structural Heart Team  Pager: [phone] Phone: [phone]

## 2021-06-23 LAB — CBC
HCT: 36.7 % (ref 36.0–46.0)
Hemoglobin: 12.4 g/dL (ref 12.0–15.0)
MCH: 29.5 pg (ref 26.0–34.0)
MCHC: 33.8 g/dL (ref 30.0–36.0)
MCV: 87.2 fL (ref 80.0–100.0)
Platelets: 170 10*3/uL (ref 150–400)
RBC: 4.21 MIL/uL (ref 3.87–5.11)
RDW: 13.9 % (ref 11.5–15.5)
WBC: 7.6 10*3/uL (ref 4.0–10.5)
nRBC: 0 % (ref 0.0–0.2)

## 2021-06-23 LAB — ECHOCARDIOGRAM LIMITED
AR max vel: 3.1 cm2
AV Area VTI: 3.02 cm2
AV Area mean vel: 2.92 cm2
AV Mean grad: 4 mmHg
AV Peak grad: 7.1 mmHg
Ao pk vel: 1.33 m/s

## 2021-06-23 LAB — BASIC METABOLIC PANEL
Anion gap: 8 (ref 5–15)
BUN: 13 mg/dL (ref 8–23)
CO2: 26 mmol/L (ref 22–32)
Calcium: 9.2 mg/dL (ref 8.9–10.3)
Chloride: 104 mmol/L (ref 98–111)
Creatinine, Ser: 0.62 mg/dL (ref 0.44–1.00)
GFR, Estimated: >60 mL/min (ref >60)
Glucose, Bld: 132 mg/dL — ABNORMAL HIGH (ref 70–99)
Potassium: 3.8 mmol/L (ref 3.5–5.1)
Sodium: 138 mmol/L (ref 135–145)

## 2021-06-23 LAB — POCT I-STAT, CHEM 8
BUN: 15 mg/dL (ref 8–23)
Calcium, Ion: 1.33 mmol/L (ref 1.15–1.40)
Chloride: 103 mmol/L (ref 98–111)
Creatinine, Ser: 0.4 mg/dL — ABNORMAL LOW (ref 0.44–1.00)
Glucose, Bld: 152 mg/dL — ABNORMAL HIGH (ref 70–99)
HCT: 34 % — ABNORMAL LOW (ref 36.0–46.0)
Hemoglobin: 11.6 g/dL — ABNORMAL LOW (ref 12.0–15.0)
Potassium: 3.9 mmol/L (ref 3.5–5.1)
Sodium: 140 mmol/L (ref 135–145)
TCO2: 31 mmol/L (ref 22–32)

## 2021-06-23 LAB — GLUCOSE, CAPILLARY
Glucose-Capillary: 101 mg/dL — ABNORMAL HIGH (ref 70–99)
Glucose-Capillary: 139 mg/dL — ABNORMAL HIGH (ref 70–99)
Glucose-Capillary: 158 mg/dL — ABNORMAL HIGH (ref 70–99)

## 2021-06-23 LAB — POCT ACTIVATED CLOTTING TIME
Activated Clotting Time: 144 seconds
Activated Clotting Time: 355 seconds

## 2021-06-23 MED ORDER — CLOPIDOGREL BISULFATE 75 MG PO TABS: 75.0000 mg | ORAL_TABLET | Freq: Every day | ORAL | 1 refills | Status: DC

## 2021-06-23 MED ORDER — CLOPIDOGREL BISULFATE 75 MG PO TABS: 75.0000 mg | ORAL_TABLET | Freq: Every day | ORAL | Status: DC

## 2021-06-23 NOTE — Plan of Care (Signed)
Neurology Plan of Care:  MRI brain 2/1: No acute intracranial process. No etiology is seen for the patient's diplopia.  Lab Results  Component Value Date   CHOL 190 05/26/2021   HDL 46.90 05/26/2021   LDLCALC 118 (H) 05/26/2021   LDLDIRECT 209 (H) 03/19/2018   TRIG 125.0 05/26/2021   CHOLHDL 4 05/26/2021   Lab Results  Component Value Date   HGBA1C 6.2 05/26/2021   Assessment/Plan: - Presentation may be due to stroke not identified on MRI versus anesthesia effect versus vestibular etiology.  - Statin for LDL goal of < 70 - Continue clopidogrel 75 mg daily for 3 weeks and ASA 81 mg PO daily - PT/OT evaluation with vestibular rehab - Follow up with outpatient neurology in 2-3 weeks and consider repeat MRI brain imaging  - Discussed with attending neurologist, Dr. Theda Sers who is in agreement  Anibal Henderson, AGACNP-BC [facility] [phone]

## 2021-06-23 NOTE — Discharge Summary (Signed)
[facility] VALVE TEAM  Discharge Summary    Patient ID: Joanna [name] MRN: [id number]; DOB: 1951/02/10  Admit date: 06/21/2021 Discharge date: 06/23/2021  Primary Care Provider: No primary care provider on file.  Primary Cardiologist: Candee Furbish, MD /Dr. Ali Lowe, MD and Dr. Cyndia Bent, MD (TAVR)  Discharge Diagnoses    Principal Problem:   S/P TAVR (transcatheter aortic valve replacement) Active Problems:   Hyperlipidemia LDL goal <70   Essential hypertension   Obstructive sleep apnea   Uncontrolled type 2 diabetes mellitus with hyperglycemia, without long-term current use of insulin ([place])   Status post angioplasty with stent   Aortic stenosis   History of right thalamic stroke in 2017   Diplopia  Allergies Allergies  Allergen Reactions   Crestor [Rosuvastatin]     REACTION: N/V and heartburn, can very small dose    Lipitor [Atorvastatin]     REACTION: Hot flashes and flu-like symptoms   Pravastatin Sodium     REACTION: Neck swelling and pain in her shoulders and arms.   Sulfonamide Derivatives     Rxn Unknown   Zetia [Ezetimibe]     GI upset   Zocor [Simvastatin] Other (See Comments)    GI issues.. Pt unknown of severity   Niacin Nausea And Vomiting    Diagnostic Studies/Procedures    TAVR OPERATIVE NOTE     Date of Procedure:                06/21/2021   Preoperative Diagnosis:      Severe Aortic Stenosis    Postoperative Diagnosis:    Same    Procedure:        Transcatheter Aortic Valve Replacement - Transfemoral Approach             Edwards Sapien 3 Resilia THV (size 23 mm, model # 9755RLS, serial #[id number])              Co-Surgeons:                         Gaye Pollack, MD and Lenna Sciara, MD Anesthesiologist:                     Echocardiographer:              Adele Barthel, MD   Pre-operative Echo Findings: Severe aortic stenosis Normal left ventricular systolic function   Post-operative Echo  Findings: Trace paravalvular leak Normal left ventricular systolic function   Echocardiogram 06/22/21:    1. Left ventricular ejection fraction, by estimation, is 60 to 65%. The  left ventricle has normal function. The left ventricle has no regional  wall motion abnormalities. There is mild concentric left ventricular  hypertrophy. Left ventricular diastolic  parameters are indeterminate.   2. Right ventricular systolic function is normal. The right ventricular  size is normal.   3. The mitral valve is degenerative. Mild mitral valve regurgitation. No  evidence of mitral stenosis. Moderate mitral annular calcification.   4. Well seated bioprosthetic aortic valve. Aortic valve regurgitation is  not visualized. No aortic stenosis is present. There is a 23 mm Sapien  prosthetic (TAVR) valve present in the aortic position. Aortic valve mean  gradient measures 12.0 mmHg. Aortic   valve Vmax measures 2.30 m/s.   5. The inferior vena cava is normal in size with greater than 50%  respiratory variability, suggesting right atrial pressure of 3 mmHg.  History of Present Illness     Joanna [name] is a 71 y.o. female with a history of hypertension, hyperlipidemia, hypothyroidism, type 2 diabetes, previous right thalamic stroke in 04/2016 felt to be due to small vessel disease without residual deficit, coronary artery disease status post DES to the mid LAD in 2019, and moderate to severe aortic stenosis. Echocardiogram in April 2022 showed a mean aortic valve gradient of 36.3 mmHg with a peak gradient of 65.5 mmHg. Valve area was 0.63 cm. Left ventricular ejection fraction is 65 to 70%. She was referred by Dr. Marlou Porch due to a 35-month history of progressive exertional fatigue and shortness of breath. She had been unable to perform much activity due to her symptoms. A repeat echo 03/24/2021 showed an increase in the mean gradient across aortic valve to 54 mmHg with a valve area of 0.58 cm with normal LVEF.  Plan was to pursue further workup with [facility] and TAVR scans. LHC from 05/04/22 showed normal filling pressures, cardiac output, cardiac index. Patent distal LAD stent with one-vessel ostial second diagonal disease with mild to moderate disease elsewhere. Recommendations were to continue with medical therapy.   She was evaluated by the multidisciplinary valve team, including Dr. Cyndia Bent with TCTS surgery and felt to have severe, symptomatic aotic stenosis and to be a suitable candidate for TAVR, which was set up for 06/21/21.     Hospital Course     Severe AS: s/p successful TAVR with a 23 mm Edwards Sapien 3SU THV via the TF approach on 06/21/21. Post operative echo with well seated bioprosthetic aortic valve with a mean gradient measuring 3mmHg, no mention of PVL on echo reading. Groin sites have remained stable with no s/s bleeding or hematoma. ECG with NSR and no high grade heart block. Continue ASA monotherapy. SBE discussed and will be RX'ed at follow up next week. Post procedure care reviewed with patient and husband understanding.   New diplopia with hx of thalamic CVA 2017: Reported new diplopia, unresolved since TAVR 06/21/21 with associated dizziness with ambulation. No other neuro changes however concerning given hx of prior CVA from 2017. Head CT 06/22/21 with no acute findings. Seen by neurology 06/22/21 with brain MRI w/o contrast showing no acute intracranial process with no etiology seen for patients diplopia. Plan will be to add Plavix $RemoveBe'75mg'jUTjKlZoc$  PO QD to regimen along with ASA $Remove'81mg'GcBLUVF$  PO QD. Follow up with OP neurology in 2-3 weeks. Will make referral at discharge. Additional recommendations are for PT/OT evaluation with vestibular rehab. Seen by PT today. Patient has deferred OP PT follow up. Reports vision is improving today with "eye exercises".    HTN: Stable today with the addition of PTA antihypertensives.    DM2: Restart home regimen and follow with PCP. Last Hb A1c, 6.2 from 05/26/21   CAD s/p  DES to mLAD/dLAD (2019): Denies anginal symptoms. Continue ASA, statin. Plavix added as above.    HLD: Last LDL, 113>>above goal of less than 70. Noted to have severe intolerance to statins. Recommended that she follow with lipid clinic for PCSK9 inhibitor however she has deferred and wishes to attempt control with diet and exercise.    Incidental findings: None   Consultants: Neurology    The patient was seen and examined by Dr. Cyndia Bent who feels that she is stable and ready for discharge today, 06/23/21.  _____________  Discharge Vitals Blood pressure 129/76, pulse 77, temperature 99.2 F (37.3 C), temperature source Oral, resp. rate 17, height $RemoveBe'5\' 3"'zsbbfdmWV$  (1.6  m), weight 67.6 kg, SpO2 98 %.  Filed Weights   06/21/21 1208 06/22/21 0500 06/23/21 0500  Weight: 68 kg 68.9 kg 67.6 kg   General: Well developed, well nourished, NAD Neck: Negative for carotid bruits. No JVD Lungs:Clear to ausculation bilaterally. Breathing is unlabored. Cardiovascular: RRR with S1 S2. Soft murmur MSK: Strength and tone appear normal for age. 5/5 in all extremities Extremities: No edema.  Neuro: Alert and oriented. No focal deficits. No facial asymmetry. MAE spontaneously. Psych: Responds to questions appropriately with normal affect.    Labs & Radiologic Studies    CBC Recent Labs    06/22/21 0608 06/23/21 0329  WBC 7.1 7.6  HGB 12.3 12.4  HCT 36.1 36.7  MCV 86.4 87.2  PLT 193 637   Basic Metabolic Panel Recent Labs    06/22/21 0608 06/23/21 0329  NA 137 138  K 3.7 3.8  CL 103 104  CO2 24 26  GLUCOSE 130* 132*  BUN 13 13  CREATININE 0.56 0.62  CALCIUM 9.3 9.2   Liver Function Tests Recent Labs    06/22/21 0608  AST 26  ALT 26  ALKPHOS 59  BILITOT 1.0  PROT 6.8  ALBUMIN 3.7   No results for input(s): LIPASE, AMYLASE in the last 72 hours. Cardiac Enzymes No results for input(s): CKTOTAL, CKMB, CKMBINDEX, TROPONINI in the last 72 hours. BNP Invalid input(s): POCBNP D-Dimer No  results for input(s): DDIMER in the last 72 hours. Hemoglobin A1C No results for input(s): HGBA1C in the last 72 hours. Fasting Lipid Panel No results for input(s): CHOL, HDL, LDLCALC, TRIG, CHOLHDL, LDLDIRECT in the last 72 hours. Thyroid Function Tests No results for input(s): TSH, T4TOTAL, T3FREE, THYROIDAB in the last 72 hours.  Invalid input(s): FREET3 _____________  DG Chest 2 View  Result Date: 06/20/2021 CLINICAL DATA:  71 year old female with preoperative chest x-ray EXAM: CHEST - 2 VIEW COMPARISON:  05/11/2016 FINDINGS: Cardiomediastinal silhouette unchanged in size and contour. No evidence of central vascular congestion. No interlobular septal thickening. Mitral annular calcifications. No pneumothorax or pleural effusion. Coarsened interstitial markings, with no confluent airspace disease. No acute displaced fracture. Degenerative changes of the spine. IMPRESSION: No active cardiopulmonary disease. Electronically Signed   By: Corrie Mckusick D.O.   On: 06/20/2021 13:53   CT HEAD WO CONTRAST (5MM)  Result Date: 06/22/2021 CLINICAL DATA:  Diplopia EXAM: CT HEAD WITHOUT CONTRAST TECHNIQUE: Contiguous axial images were obtained from the base of the skull through the vertex without intravenous contrast. RADIATION DOSE REDUCTION: This exam was performed according to the departmental dose-optimization program which includes automated exposure control, adjustment of the mA and/or kV according to patient size and/or use of iterative reconstruction technique. COMPARISON:  CT head 05/08/2021, brain MRI 05/10/2016 FINDINGS: Brain: There is no evidence of acute intracranial hemorrhage, extra-axial fluid collection, or acute infarct. Parenchymal volume is normal. The ventricles are normal in size. Gray-white differentiation is preserved. A remote lacunar infarct in the right thalamus is unchanged. There is no mass lesion.  There is no midline shift. Vascular: There is calcification of the bilateral  cavernous ICAs. Skull: Normal. Negative for fracture or focal lesion. Sinuses/Orbits: There is minimal mucosal thickening in the right sphenoid sinus. Bilateral lens implants are in place. The globes and orbits are otherwise unremarkable. Other: None. IMPRESSION: No acute intracranial pathology. Electronically Signed   By: Valetta Mole M.D.   On: 06/22/2021 10:03   MR BRAIN WO CONTRAST  Result Date: 06/23/2021 CLINICAL DATA:  [place] EXAM: MRI  HEAD WITHOUT CONTRAST TECHNIQUE: Multiplanar, multiecho pulse sequences of the brain and surrounding structures were obtained without intravenous contrast. COMPARISON:  MRI head 05/10/2016, correlation is also made with CT head 06/22/2021 FINDINGS: Brain: No restricted diffusion to suggest acute or subacute infarct. No acute hemorrhage, mass, mass effect, or midline shift. Scattered T2 hyperintense signal in the periventricular white matter, likely the sequela of chronic small vessel ischemic disease. Vascular: Normal flow voids. Skull and upper cervical spine: Normal marrow signal. Sinuses/Orbits: No acute finding. Status post bilateral lens replacements. Other: Fluid in right mastoid air cells. IMPRESSION: No acute intracranial process. No etiology is seen for the patient's diplopia. Electronically Signed   By: Merilyn Baba M.D.   On: 06/23/2021 02:20   ECHOCARDIOGRAM COMPLETE  Result Date: 06/22/2021    ECHOCARDIOGRAM REPORT   Patient Name:   Kalii Chesmore Date of Exam: 06/22/2021 Medical Rec #:  [id number]  Height:       63.0 in Accession #:    [id number] Weight:       151.9 lb Date of Birth:  31-Mar-1951   BSA:          1.720 m Patient Age:    63 years   BP:           151/92 mmHg Patient Gender: F          HR:           74 bpm. Exam Location:  Inpatient Procedure: 2D Echo Indications:    Post TAVR  History:        Patient has prior history of Echocardiogram examinations, most                 recent 06/21/2021. Risk Factors:Diabetes, Hypertension and Sleep                  Apnea.                 Aortic Valve: 23 mm Sapien prosthetic, stented (TAVR) valve is                 present in the aortic position.  Sonographer:    Jefferey Pica Referring Phys: [phone] Courtlynn Holloman D Xiong Haidar IMPRESSIONS  1. Left ventricular ejection fraction, by estimation, is 60 to 65%. The left ventricle has normal function. The left ventricle has no regional wall motion abnormalities. There is mild concentric left ventricular hypertrophy. Left ventricular diastolic parameters are indeterminate.  2. Right ventricular systolic function is normal. The right ventricular size is normal.  3. The mitral valve is degenerative. Mild mitral valve regurgitation. No evidence of mitral stenosis. Moderate mitral annular calcification.  4. Well seated bioprosthetic aortic valve. Aortic valve regurgitation is not visualized. No aortic stenosis is present. There is a 23 mm Sapien prosthetic (TAVR) valve present in the aortic position. Aortic valve mean gradient measures 12.0 mmHg. Aortic  valve Vmax measures 2.30 m/s.  5. The inferior vena cava is normal in size with greater than 50% respiratory variability, suggesting right atrial pressure of 3 mmHg. FINDINGS  Left Ventricle: Left ventricular ejection fraction, by estimation, is 60 to 65%. The left ventricle has normal function. The left ventricle has no regional wall motion abnormalities. The left ventricular internal cavity size was small. There is mild concentric left ventricular hypertrophy. Left ventricular diastolic function could not be evaluated due to mitral annular calcification (moderate or greater). Left ventricular diastolic parameters are indeterminate. Right Ventricle: The right ventricular size is normal. No increase in right ventricular  wall thickness. Right ventricular systolic function is normal. Left Atrium: Left atrial size was normal in size. Right Atrium: Right atrial size was normal in size. Pericardium: There is no evidence of pericardial effusion.  Presence of epicardial fat layer. Mitral Valve: The mitral valve is degenerative in appearance. Moderate mitral annular calcification. Mild mitral valve regurgitation. No evidence of mitral valve stenosis. Tricuspid Valve: The tricuspid valve is normal in structure. Tricuspid valve regurgitation is not demonstrated. No evidence of tricuspid stenosis. Aortic Valve: Well seated bioprosthetic aortic valve. The aortic valve has been repaired/replaced. Aortic valve regurgitation is not visualized. No aortic stenosis is present. Aortic valve mean gradient measures 12.0 mmHg. Aortic valve peak gradient measures 21.3 mmHg. There is a 23 mm Sapien prosthetic, stented (TAVR) valve present in the aortic position. Pulmonic Valve: The pulmonic valve was normal in structure. Pulmonic valve regurgitation is not visualized. No evidence of pulmonic stenosis. Aorta: The aortic root is normal in size and structure. Venous: The inferior vena cava is normal in size with greater than 50% respiratory variability, suggesting right atrial pressure of 3 mmHg. IAS/Shunts: No atrial level shunt detected by color flow Doppler.  LEFT VENTRICLE PLAX 2D LVIDd:         3.90 cm Diastology LVIDs:         2.20 cm LV e' medial:    3.38 cm/s LV PW:         1.40 cm LV E/e' medial:  22.7 LV IVS:        1.30 cm LV e' lateral:   5.40 cm/s                        LV E/e' lateral: 14.2  RIGHT VENTRICLE             IVC RV Basal diam:  1.80 cm     IVC diam: 1.90 cm RV S prime:     19.70 cm/s TAPSE (M-mode): 1.9 cm LEFT ATRIUM             Index        RIGHT ATRIUM           Index LA diam:        4.20 cm 2.44 cm/m   RA Area:     10.10 cm LA Vol (A2C):   35.3 ml 20.52 ml/m  RA Volume:   18.50 ml  10.75 ml/m LA Vol (A4C):   51.4 ml 29.88 ml/m LA Biplane Vol: 42.6 ml 24.76 ml/m  AORTIC VALVE                    PULMONIC VALVE AV Vmax:           230.50 cm/s  PV Vmax:       1.19 m/s AV Vmean:          160.500 cm/s PV Peak grad:  5.7 mmHg AV VTI:            0.436 m  AV Peak Grad:      21.3 mmHg AV Mean Grad:      12.0 mmHg LVOT Vmax:         184.00 cm/s LVOT Vmean:        125.000 cm/s LVOT VTI:          0.353 m LVOT/AV VTI ratio: 0.81  AORTA Ao Asc diam: 2.40 cm MITRAL VALVE MV Area (PHT): 3.89 cm     SHUNTS MV Decel Time: 195 msec  Systemic VTI: 0.35 m MV E velocity: 76.70 cm/s MV A velocity: 148.00 cm/s MV E/A ratio:  0.52 Kardie Tobb DO Electronically signed by Berniece Salines DO Signature Date/Time: 06/22/2021/12:48:44 PM    Final    ECHOCARDIOGRAM LIMITED  Result Date: 06/23/2021    ECHOCARDIOGRAM LIMITED REPORT   Patient Name:   Joanna [name] Date of Exam: 06/21/2021 Medical Rec #:  [id number]    Height:       63.0 in Accession #:    [id number]   Weight:       151.5 lb Date of Birth:  January 28, 1951     BSA:          1.718 m Patient Age:    57 years     BP:           170/76 mmHg Patient Gender: F            HR:           69 bpm. Exam Location:  Inpatient Procedure: Limited Echo, Color Doppler and Cardiac Doppler Indications:     Aortic stenosis I35.0  History:         Patient has prior history of Echocardiogram examinations, most                  recent 03/24/2021. CAD, Stroke; Risk Factors:Diabetes,                  Dyslipidemia, Hypertension and Sleep Apnea.                  Aortic Valve: 23 mm Sapien prosthetic, stented (TAVR) valve is                  present in the aortic position. Procedure Date: 06/21/2021.  Sonographer:     Darlina Sicilian RDCS Referring Phys:  [id number] Early Osm[place] Diagnosing Phys: Jenkins Rouge MD IMPRESSIONS  1. Left ventricular ejection fraction, by estimation, is 60 to 65%. The left ventricle has normal function. There is mild left ventricular hypertrophy.  2. The mitral valve is abnormal. Trivial mitral valve regurgitation.  3. Pre TAVR: calcified tri leaflet AV mean gradient 42 peak 68 mmhg AVA 0.51 cm2         Post TAVR : well positioned 23 mm Sapien 3 valve . The aortic valve has been repaired/replaced. There is a 23 mm Sapien prosthetic (TAVR)  valve present in the aortic position. Procedure Date: 06/21/2021. FINDINGS  Left Ventricle: Left ventricular ejection fraction, by estimation, is 60 to 65%. The left ventricle has normal function. The left ventricular internal cavity size was small. There is mild left ventricular hypertrophy. Pericardium: There is no evidence of pericardial effusion. Mitral Valve: The mitral valve is abnormal. There is mild thickening of the mitral valve leaflet(s). There is mild calcification of the mitral valve leaflet(s). Mild mitral annular calcification. Trivial mitral valve regurgitation. Aortic Valve: Pre TAVR: calcified tri leaflet AV mean gradient 42 peak 68 mmhg AVA 0.51 cm2 Post TAVR : well positioned 23 mm Sapien 3 valve. The aortic valve has been repaired/replaced. Aortic valve mean gradient measures 4.0 mmHg. Aortic valve peak gradient measures 7.1 mmHg. Aortic valve area, by VTI measures 3.02 cm. There is a 23 mm Sapien prosthetic, stented (TAVR) valve present in the aortic position. Procedure Date: 06/21/2021. LEFT VENTRICLE PLAX 2D LVOT diam:     2.30 cm LV SV:         103 LV SV Index:   60 LVOT  Area:     4.15 cm  AORTIC VALVE AV Area (Vmax):    3.10 cm AV Area (Vmean):   2.92 cm AV Area (VTI):     3.02 cm AV Vmax:           133.00 cm/s AV Vmean:          99.000 cm/s AV VTI:            0.341 m AV Peak Grad:      7.1 mmHg AV Mean Grad:      4.0 mmHg LVOT Vmax:         99.10 cm/s LVOT Vmean:        69.600 cm/s LVOT VTI:          0.248 m LVOT/AV VTI ratio: 0.73  SHUNTS Systemic VTI:  0.25 m Systemic Diam: 2.30 cm Jenkins Rouge MD Electronically signed by Jenkins Rouge MD Signature Date/Time: 06/23/2021/9:43:19 AM    Final    Structural Heart Procedure  Result Date: 06/21/2021 See surgical note for result.  Disposition   Pt is being discharged home today in good condition.  Follow-up Plans & Appointments     Follow-up Information     Eileen Stanford, PA-C Follow up on 07/01/2021.   Specialties:  Cardiology, Radiology Why: at 1pm. Please arrive to your appointment at 12:45pm. Contact information: [place] [address] [phone]                Discharge Instructions     Ambulatory referral to Neurology   Complete by: As directed    An appointment is requested in approximately: 2 weeks   Call MD for:  difficulty breathing, headache or visual disturbances   Complete by: As directed    Call MD for:  extreme fatigue   Complete by: As directed    Call MD for:  hives   Complete by: As directed    Call MD for:  persistant dizziness or light-headedness   Complete by: As directed    Call MD for:  persistant nausea and vomiting   Complete by: As directed    Call MD for:  redness, tenderness, or signs of infection (pain, swelling, redness, odor or green/yellow discharge around incision site)   Complete by: As directed    Call MD for:  severe uncontrolled pain   Complete by: As directed    Call MD for:  temperature >100.4   Complete by: As directed    Diet - low sodium heart healthy   Complete by: As directed    Discharge instructions   Complete by: As directed    ACTIVITY AND EXERCISE  Daily activity and exercise are an important part of your recovery. People recover at different rates depending on their general health and type of valve procedure.  Most people recovering from TAVR feel better relatively quickly   No lifting, pushing, pulling more than 10 pounds (examples to avoid: groceries, vacuuming, gardening, golfing):             - For one week with a procedure through the groin.             - For six weeks for procedures through the chest wall or neck. NOTE: You will typically see one of our providers 7-14 days after your procedure to discuss [place] the above activities.      DRIVING  Do not drive until you are seen for follow up and cleared by  a provider. Generally, we ask patient to not drive for 1 week after their  procedure.  If you have been told by your doctor in the past that you may not drive, you must talk with him/her before you begin driving again.   DRESSING  Groin site: you may leave the clear dressing over the site for up to one week or until it falls off.   HYGIENE  If you had a femoral (leg) procedure, you may take a shower when you return home. After the shower, pat the site dry. Do NOT use powder, oils or lotions in your groin area until the site has completely healed.  If you had a chest procedure, you may shower when you return home unless specifically instructed not to by your discharging practitioner.             - DO NOT scrub incision; pat dry with a towel.             - DO NOT apply any lotions, oils, powders to the incision.             - No tub baths / swimming for at least 2 weeks.  If you notice any fevers, chills, increased pain, swelling, bleeding or pus, please contact your doctor.   ADDITIONAL INFORMATION  If you are going to have an upcoming dental procedure, please contact our office as you will require antibiotics ahead of time to prevent infection on your heart valve.    If you have any questions or concerns you can call the structural heart phone during normal business hours 8am-4pm. If you have an urgent need after hours or weekends please call [phone] to talk to the on call provider for general cardiology. If you have an emergency that requires immediate attention, please call 911.    After TAVR Checklist  Check  Test Description  Follow up appointment in 1-2 weeks  You will see our structural heart advanced practice provider. Your incision sites will be checked and you will be cleared to drive and resume all normal activities if you are doing well.    1 month echo and follow up  You will have an echo to check on your new heart valve and be seen back in the office by a structural heart advanced practice provider.  Follow up with your primary  cardiologist You will need to be seen by your primary cardiologist in the following 3-6 months after your 1 month appointment in the valve clinic.   1 year echo and follow up You will have another echo to check on your heart valve after 1 year and be seen back in the office by a structural heart advanced practice provider. This your last structural heart visit.  Bacterial endocarditis prophylaxis  You will have to take antibiotics for the rest of your life before all dental procedures (even teeth cleanings) to protect your heart valve. Antibiotics are also required before some surgeries. Please check with your cardiologist before scheduling any surgeries. Also, please make sure to tell us if you have a penicillin allergy as you will require an alternative antibiotic.   If the dressing is still on your incision site when you go home, remove it on the third day after your surgery date. Remove dressing if it begins to fall off, or if it is dirty or damaged before the third day.   Complete by: As directed    Increase activity slowly   Complete by: As directed  Discharge Medications   Allergies as of 06/23/2021       Reactions   Crestor [rosuvastatin]    REACTION: N/V and heartburn, can very small dose    Lipitor [atorvastatin]    REACTION: Hot flashes and flu-like symptoms   Pravastatin Sodium    REACTION: Neck swelling and pain in her shoulders and arms.   Sulfonamide Derivatives    Rxn Unknown   Zetia [ezetimibe]    GI upset   Zocor [simvastatin] Other (See Comments)   GI issues.. Pt unknown of severity   Niacin Nausea And Vomiting        Medication List     TAKE these medications    amLODipine 10 MG tablet Commonly known as: NORVASC TAKE 1/2 TABLET BY MOUTH EVERY DAY   aspirin EC 81 MG tablet Take 1 tablet (81 mg total) by mouth daily.   carvedilol 6.25 MG tablet Commonly known as: COREG TAKE 1 TABLET BY MOUTH TWICE A DAY   clopidogrel 75 MG tablet Commonly known  as: PLAVIX Take 1 tablet (75 mg total) by mouth daily.   glimepiride 1 MG tablet Commonly known as: AMARYL TAKE 1 TABLET BY MOUTH EVERYDAY AT BEDTIME   glucose blood test strip Commonly known as: ONE TOUCH ULTRA TEST USE TO CHECK BLOOD SUGAR TWICE DAILY  Dx:E11.65   levothyroxine 125 MCG tablet Commonly known as: Synthroid 1 tab daily except 1/2 on _0 /02/23 1114              Outstanding Labs/Studies   None   Duration of Discharge Encounter   Greater than 30 minutes including physician time.  SignedKathyrn Drown, NP 06/23/2021, 11:14 AM [phone]

## 2021-06-23 NOTE — TOC Transition Note (Signed)
Transition of Care (TOC) - CM/SW Discharge Note Marvetta Gibbons RN, BSN Transitions of Care Unit 4E- RN Case Manager See Treatment Team for direct phone #    Patient Details  Name: [name] MRN: [id number] Date of Birth: [birth year]  Transition of Care [facility]) CM/SW Contact:  Dawayne Patricia, RN Phone Number: 06/23/2021, 11:56 AM   Clinical Narrative:    Pt stable for transition home s/p TAVR, Transition of Care Department [facility]) has reviewed patient and no TOC needs have been identified at this time. Pt to return home with family  Final next level of care: Home/Self Care Barriers to Discharge: No Barriers Identified   Patient Goals and CMS Choice     Choice offered to / list presented to : NA  Discharge Placement               Home        Discharge Plan and Services                DME Arranged: N/A DME Agency: NA       HH Arranged: NA HH Agency: NA        Social Determinants of Health (SDOH) Interventions     Readmission Risk Interventions Readmission Risk Prevention Plan 06/23/2021  Post Dischage Appt Complete  Medication Screening Complete  Transportation Screening Complete  Some recent data might be hidden

## 2021-06-23 NOTE — Evaluation (Signed)
Occupational Therapy Evaluation Patient Details Name: Joanna [name] MRN: [id number] DOB: Jun 02, 1950 Today's Date: 06/23/2021   History of Present Illness Pt is a 71 y/o female s/p TAVR. Also +covid and developed double vision following surgery. MRI negatvie. PMH includes DM, CAD, aortic stenosis, and CVA.   Clinical Impression   PTA patient was living with her spouse in a private residence and was grossly I with ADLs/IADLs without AD. Patient currently functioning near baseline demonstrating observed ADLs with independence. Patient reports nystagmus and diplopia in all directions s/p TAVR. This a.m. patient reports improvement in diplopia reporting presence with far R gaze only. Patient reports finding visual exercises online and states that she completed them several times yesterday including visual fixation (centrally) and convergence. Patient in agreement with family assisting with transportation. Recommend follow-up with eye doctor if diplopia does not continue to improve. Patient does not require continued acute occupational therapy services with OT to sign off at this time.      Recommendations for follow up therapy are one component of a multi-disciplinary discharge planning process, led by the attending physician.  Recommendations may be updated based on patient status, additional functional criteria and insurance authorization.   Follow Up Recommendations  Other (comment) (If R gaze diplopia continues, recommend follow-up with eye doctor.)    Assistance Recommended at Discharge PRN  Patient can return home with the following      Functional Status Assessment  Patient has not had a recent decline in their functional status  Equipment Recommendations  None recommended by OT    Recommendations for Other Services       Precautions / Restrictions Precautions Precautions: Fall Restrictions Weight Bearing Restrictions: No      Mobility Bed Mobility Overal bed mobility: Modified  Independent                  Transfers Overall transfer level: Independent                        Balance Overall balance assessment: Independent                                         ADL either performed or assessed with clinical judgement   ADL Overall ADL's : Independent                                             Vision Baseline Vision/History: 0 No visual deficits Ability to See in Adequate Light: 0 Adequate Patient Visual Report: Diplopia (Reports diplopia in all directions after TAPR 2/1. This a.m. patient reports improvement with diplopia on with scanning to the R.) Vision Assessment?: Yes Eye Alignment: Within Functional Limits Ocular Range of Motion: Within Functional Limits Alignment/Gaze Preference: Within Defined Limits Tracking/Visual Pursuits:  (Can track in all directions. Nystagmus when scanning to R.) Saccades: Within functional limits Diplopia Assessment: Objects split on top of one another (Only with far right gaze)     Perception     Praxis      Pertinent Vitals/Pain Pain Assessment Pain Assessment: No/denies pain     Hand Dominance Right   Extremity/Trunk Assessment Upper Extremity Assessment Upper Extremity Assessment: Overall WFL for tasks assessed   Lower Extremity Assessment Lower Extremity Assessment: Overall WFL for  tasks assessed   Cervical / Trunk Assessment Cervical / Trunk Assessment: Normal   Communication Communication Communication: No difficulties   Cognition Arousal/Alertness: Awake/alert Behavior During Therapy: WFL for tasks assessed/performed Overall Cognitive Status: Within Functional Limits for tasks assessed                                       General Comments  Performed dix hallpike to the R and did not note any nystagmus and pt asymptomatic. With visual tracking to the R did note some nystagmus and pt reporting double vision, vertically.     Exercises     Shoulder Instructions      Home Living Family/patient expects to be discharged to:: Private residence Living Arrangements: Spouse/significant other Available Help at Discharge: Family;Available 24 hours/day Type of Home: House Home Access: Stairs to enter [employer] of Steps: 2 Entrance Stairs-Rails: None Home Layout: One level               Home Equipment: None          Prior Functioning/Environment Prior Level of Function : Independent/Modified Independent                        OT Problem List:        OT Treatment/Interventions:      OT Goals(Current goals can be found in the care plan section) Acute Rehab OT Goals Patient Stated Goal: To return home. OT Goal Formulation: With patient  OT Frequency:      Co-evaluation              AM-PAC OT "6 Clicks" Daily Activity     Outcome Measure Help from another person eating meals?: None Help from another person taking care of personal grooming?: None Help from another person toileting, which includes using toliet, bedpan, or urinal?: None Help from another person bathing (including washing, rinsing, drying)?: None Help from another person to put on and taking off regular upper body clothing?: None Help from another person to put on and taking off regular lower body clothing?: None 6 Click Score: 24   End of Session Nurse Communication: Mobility status  Activity Tolerance: Patient tolerated treatment well Patient left: in bed;with call bell/phone within reach;with family/visitor present                   Time: 2836-6294 OT Time Calculation (min): 13 min Charges:  OT General Charges $OT Visit: 1 Visit OT Evaluation $OT Eval Low Complexity: 1 Low  Cashe Gatt H. OTR/L Supplemental OT, Department of rehab services [phone]  Marquis Diles R H. 06/23/2021, 10:45 AM

## 2021-06-23 NOTE — Progress Notes (Signed)
CARDIAC REHAB PHASE I   TAVR education completed via phone due to Covid restrictions. Pt educated on site care, restrictions, and exercise guidelines. Pt states her breathing feels much improved and is grateful for the care she has received. Declines CRP II at this time.  [id number] Rufina Falco, RN BSN 06/23/2021 11:27 AM

## 2021-06-23 NOTE — Evaluation (Signed)
Physical Therapy Evaluation Patient Details Name: Joanna [name] MRN: [id number] DOB: 10-03-1950 Today's Date: 06/23/2021  History of Present Illness  Pt is a 71 y/o female s/p TAVR. Also +covid and developed double vision following surgery. MRI negatvie. PMH includes DM, CAD, aortic stenosis, and CVA.  Clinical Impression  Pt admitted secondary to problem above with deficits below. Pt with much improved tolerance for mobility and with very mild symptoms. Only noted double vision when tracking to the R this session; also noted some nystagmus with tracking. Performed dix hallpike, but did not reproduce symptoms. Pt has been performing visual exercises that have helped and wants to continue to work on those after d/c before pursuing any follow up. Pt reports she will have family to assist as needed at d/c. Will continue to follow acutely.         Recommendations for follow up therapy are one component of a multi-disciplinary discharge planning process, led by the attending physician.  Recommendations may be updated based on patient status, additional functional criteria and insurance authorization.  Follow Up Recommendations No PT follow up (pt requesting to see how she does at home with visual exercises before follow up)    Assistance Recommended at Discharge PRN  Patient can return home with the following       Equipment Recommendations None recommended by PT  Recommendations for Other Services       Functional Status Assessment Patient has had a recent decline in their functional status and demonstrates the ability to make significant improvements in function in a reasonable and predictable amount of time.     Precautions / Restrictions Precautions Precautions: Fall Restrictions Weight Bearing Restrictions: No      Mobility  Bed Mobility Overal bed mobility: Modified Independent             General bed mobility comments: Reports asymptomatic    Transfers Overall transfer  level: Needs assistance Equipment used: None, Rolling walker (2 wheels) Transfers: Sit to/from Stand Sit to Stand: Min guard           General transfer comment: Min guard for safety. No overt LOB noted.    Ambulation/Gait Ambulation/Gait assistance: Min guard, Supervision Gait Distance (Feet): 40 Feet Assistive device: None, Rolling walker (2 wheels) Gait Pattern/deviations: Step-through pattern, Decreased stride length Gait velocity: Decreased     General Gait Details: overall steady gait. Mildly symptomatic when looking to the R. Otherwise tolerated well.  Stairs            Wheelchair Mobility    Modified Rankin (Stroke Patients Only)       Balance Overall balance assessment: No apparent balance deficits (not formally assessed)                                           Pertinent Vitals/Pain Pain Assessment Pain Assessment: No/denies pain    Home Living Family/patient expects to be discharged to:: Private residence Living Arrangements: Spouse/significant other Available Help at Discharge: Family;Available 24 hours/day Type of Home: House Home Access: Stairs to enter Entrance Stairs-Rails: None Entrance Stairs-Number of Steps: 2   Home Layout: One level Home Equipment: None      Prior Function Prior Level of Function : Independent/Modified Independent                     Hand Dominance  Extremity/Trunk Assessment   Upper Extremity Assessment Upper Extremity Assessment: Overall WFL for tasks assessed    Lower Extremity Assessment Lower Extremity Assessment: Overall WFL for tasks assessed    Cervical / Trunk Assessment Cervical / Trunk Assessment: Normal  Communication   Communication: No difficulties  Cognition Arousal/Alertness: Awake/alert Behavior During Therapy: WFL for tasks assessed/performed Overall Cognitive Status: Within Functional Limits for tasks assessed                                           General Comments General comments (skin integrity, edema, etc.): Performed dix hallpike to the R and did not note any nystagmus and pt asymptomatic. With visual tracking to the R did note some nystagmus and pt reporting double vision, vertically.    Exercises     Assessment/Plan    PT Assessment Patient needs continued PT services  PT Problem List Decreased activity tolerance;Decreased mobility       PT Treatment Interventions Gait training;Stair training;Functional mobility training;Therapeutic activities;Balance training;Therapeutic exercise;Patient/family education    PT Goals (Current goals can be found in the Care Plan section)  Acute Rehab PT Goals Patient Stated Goal: to go home PT Goal Formulation: With patient Time For Goal Achievement: 07/07/21 Potential to Achieve Goals: Good    Frequency Min 3X/week     Co-evaluation               AM-PAC PT "6 Clicks" Mobility  Outcome Measure Help needed turning from your back to your side while in a flat bed without using bedrails?: None Help needed moving from lying on your back to sitting on the side of a flat bed without using bedrails?: None Help needed moving to and from a bed to a chair (including a wheelchair)?: A Little Help needed standing up from a chair using your arms (e.g., wheelchair or bedside chair)?: A Little Help needed to walk in hospital room?: A Little Help needed climbing 3-5 steps with a railing? : A Little 6 Click Score: 20    End of Session Equipment Utilized During Treatment: Gait belt Activity Tolerance: Patient tolerated treatment well Patient left: in bed;with call bell/phone within reach Nurse Communication: Mobility status PT Visit Diagnosis: Other abnormalities of gait and mobility (R26.89)    Time: 1497-0263 PT Time Calculation (min) (ACUTE ONLY): 25 min   Charges:   PT Evaluation $PT Eval Low Complexity: 1 Low PT Treatments $Gait Training: 8-22 mins         Lou Miner, DPT  Acute Rehabilitation Services  Pager: [phone] Office: [phone]   Rudean Hitt 06/23/2021, 10:18 AM

## 2021-06-23 NOTE — Progress Notes (Signed)
Pt to be d/c'd home from 4E. D/c instructions and medication education provided. Tele and IV removed.  Raelyn Number, RN

## 2021-06-24 ENCOUNTER — Telehealth: Payer: Self-pay | Admitting: Cardiology

## 2021-06-24 NOTE — Telephone Encounter (Signed)
°  [place] VALVE TEAM   Patient contacted regarding discharge from [facility] on 06/24/21   Patient understands to follow up with provider Kathyrn Drown, NP on 07/01/21 at the [place] location   Patient understands discharge instructions? Yes  Patient understands medications and regimen? Yes  Patient understands to bring all medications to this visit? Yes   Kathyrn Drown NP-C Structural Heart Team  Pager: [phone]

## 2021-06-24 NOTE — Telephone Encounter (Signed)
°  [facility]   MULTIDISCIPLINARY HEART VALVE TEAM   Patient called regarding discharge from [facility] on 06/23/21. Left message for patient to return my call.    Kathyrn Drown NP-C Structural Heart Team  Pager: [phone]

## 2021-06-27 ENCOUNTER — Encounter: Payer: Self-pay | Admitting: Neurology

## 2021-06-27 LAB — POCT ACTIVATED CLOTTING TIME: Activated Clotting Time: 151 seconds

## 2021-06-27 MED FILL — Potassium Chloride Inj 2 mEq/ML: INTRAVENOUS | Qty: 40 | Status: AC

## 2021-06-27 MED FILL — Magnesium Sulfate Inj 50%: INTRAMUSCULAR | Qty: 10 | Status: AC

## 2021-06-27 MED FILL — Heparin Sodium (Porcine) Inj 1000 Unit/ML: Qty: 1000 | Status: AC

## 2021-06-27 NOTE — Progress Notes (Signed)
We do this on all our patients knowing it doesn't get paid for. sorry

## 2021-06-30 NOTE — Progress Notes (Signed)
[place]                                     Cardiology Office Note:    Date:  07/01/2021   ID:  JERUSHA REISING, DOB 09/08/1950, MRN [id number]  PCP:  Merryl Hacker, No  [facility] Cardiologist:  Candee Furbish, MD/Dr. Ali Lowe, MD and Dr. Cyndia Bent, MD (TAVR)  [facility] Electrophysiologist:  None   Referring MD: No ref. provider found   Chief Complaint  Patient presents with   Follow-up    1 week s/p TAVR   History of Present Illness:    SABRINE PATCHEN is a 71 y.o. female with a hx of hypertension, hyperlipidemia, hypothyroidism, type 2 diabetes, previous right thalamic stroke in 04/2016 felt to be due to small vessel disease without residual deficit, coronary artery disease status post DES to the mid LAD in 2019, and moderate to severe aortic stenosis. Echocardiogram in April 2022 showed a mean aortic valve gradient of 36.3 mmHg with a peak gradient of 65.5 mmHg. Valve area was 0.63 cm. Left ventricular ejection fraction is 65 to 70%. She was referred by Dr. Marlou Porch due to a 18-monthhistory of progressive exertional fatigue and shortness of breath. She had been unable to perform much activity due to her symptoms. A repeat echo 03/24/2021 showed an increase in the mean gradient across aortic valve to 54 mmHg with a valve area of 0.58 cm with normal LVEF. [facility]. LHC from 05/04/22 showed normal filling pressures, cardiac output, cardiac index. Patent distal LAD stent with one-vessel ostial second diagonal disease with mild to moderate disease elsewhere. Recommendations were to continue with medical therapy.    She was evaluated by the multidisciplinary valve team, including Dr. BCyndia Bentwith TCTS surgery and felt to have severe, symptomatic aotic stenosis and to be a suitable candidate for TAVR. She ultimately underwent  successful TAVR with a 23 mm Edwards Sapien 3SU THV via the TF approach on  06/21/21. Post operative echo with well seated bioprosthetic aortic valve with a mean gradient measuring 192mg, no mention of PVL on echo reading. Groin sites with stable with no bleeding or hematoma. Plan initially was for ASA monotherapy but unfortunately she had symptoms of new diplopia with associated dizziness with ambulation. No other neuro changes however concerning given hx of prior CVA from 2017. Head CT 06/22/21 with no acute findings. Neurology was consulted 06/22/21 with brain MRI showing no acute intracranial process with no etiology seen for patients diplopia. Plan was to add Plavix 7553mO QD to regimen along with ASA 45m64m QD. OP neurology referral was placed at discharge with appointment set for 4/26.   Today she is here with her husband and reports that she is doing so well after TAVR. Her previous arm "heaviness" and SOB with exertion is nearly gone. She is no longer fatigued and her sleeping habits are also back to her baseline. She states that she still has very mild diplopia in the right lateral corner of her sight however she continues to do vestibular exercises which have helped. She will continue on Plavix until 07/12/21 per neurology recommendations and has follow up 4/26. She denies chest pain, palpitations, SOB, LE edema, orthopnea, dizziness, or syncope. No new neuro changes. No worsening diplopia. No bleeding.   Past Medical History:  Diagnosis Date   Abnormal liver function tests    Adenomatous colon polyp    CAD (coronary artery disease) 05/09/2018   a. LHC 05/09/18: DES to mid/dist LAD, mod AS   Cerebrovascular disease    a. CT 04/2016 -calcific plaque at the origin LEFT vertebral contributing to severe stenosis.   Diverticulitis of colon    Fatty liver    a. mild fatty liver infiltration by CT 2014.   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 TAli Loweand Dr. BCyndia Bent  Statin intolerance    Stroke ([facility]    a. right thalamic infarct in 04/2016 felt by neuro to be due to small vessel disease.   Type II diabetes mellitus ([place]     Past Surgical History:  Procedure Laterality Date   CARDIAC CATHETERIZATION     CATARACT EXTRACTION W/ INTRAOCULAR LENS  IMPLANT, BILATERAL Bilateral 08/2016   COLONOSCOPY W/ BIOPSIES AND POLYPECTOMY  05/19/2005   adenomatous polyps   CORONARY STENT INTERVENTION N/A 05/09/2018   Procedure: CORONARY STENT INTERVENTION;  Surgeon: ENelva Bush MD;  [place] [facility];  Service: Cardiovascular;  Laterality: N/A;   INTRAOPERATIVE TRANSTHORACIC ECHOCARDIOGRAM N/A 06/21/2021   Procedure: INTRAOPERATIVE TRANSTHORACIC ECHOCARDIOGRAM;  Surgeon: TEarly Osmond MD;  [place] [facility]  Service: Open Heart Surgery;  Laterality: N/A;   LEFT HEART CATH AND CORONARY ANGIOGRAPHY N/A 05/09/2018   Procedure: LEFT HEART CATH AND CORONARY ANGIOGRAPHY;  Surgeon: ENelva Bush MD;  [place] [facility];  Service: Cardiovascular;  Laterality: N/A;   MELANOMA EXCISION  1980s   mid upper stomach   NEPHROLITHOTOMY Left 01/27/2013   Procedure: NEPHROLITHOTOMY PERCUTANEOUS;  Surgeon: LDutch Gray MD;  Location: [facility];  Service: Urology;  Laterality: Left;   RIGHT/LEFT HEART CATH AND CORONARY ANGIOGRAPHY N/A 05/04/2021   Procedure: RIGHT/LEFT HEART CATH AND CORONARY ANGIOGRAPHY;  Surgeon: TEarly Osmond MD;  [place] [facility];  Service: Cardiovascular;  Laterality: N/A;   TRANSCATHETER AORTIC VALVE REPLACEMENT, TRANSFEMORAL N/A 06/21/2021   Procedure: TRANSCATHETER AORTIC VALVE REPLACEMENT, TRANSFEMORAL USING A 23 MM EDWARDS SAPIEN 3 ULTRA  AORTIC VALVE;  Surgeon: TEarly Osmond MD;  [place] [facility]  Service: Open Heart Surgery;  Laterality: N/A;   TUBAL LIGATION     WISDOM TEETH EXTRACTED      Current Medications: Current Meds  Medication Sig   amLODipine (NORVASC) 10 MG tablet TAKE 1/2 TABLET BY MOUTH EVERY DAY   amoxicillin (AMOXIL) 500 MG tablet Take 4 tablets (2,000 mg total) by mouth as directed. 1 HOUR PRIOR TO DENTAL APPOINTMENTS   aspirin EC 81 MG tablet Take 1 tablet (81 mg total) by mouth daily.   Blood Glucose Monitoring Suppl (ONETOUCH VERIO) w/Device KIT by Does not apply route. Use to check blood sugars once daily.   carvedilol (COREG) 6.25 MG tablet TAKE 1 TABLET BY MOUTH TWICE A DAY   clopidogrel (PLAVIX) 75 MG tablet Take 1 tablet (75 mg total) by mouth daily.   glimepiride (AMARYL) 1 MG tablet TAKE 1 TABLET BY MOUTH  EVERYDAY AT BEDTIME   glucose blood (ONE TOUCH ULTRA TEST) test strip USE TO CHECK BLOOD SUGAR TWICE DAILY  Dx:E11.65   Lancets (ONETOUCH DELICA PLUS [id number]) MISC Use to test blood sugars once daily.   levothyroxine (SYNTHROID) 125 MCG tablet 1 tab daily except 1/2 on Sundays   Magnesium 500 MG TABS Take 500 mg by mouth 2 (two) times daily.   metFORMIN (GLUCOPHAGE) 1000 MG tablet TAKE 1 TABLET BY MOUTH TWICE A DAY   nitroGLYCERIN (NITROSTAT) 0.4 MG SL tablet Place 1 tablet (0.4 mg total) under the tongue every 5 (five) minutes x 3 doses as needed for chest pain.   OneTouch Delica Lancets 42A MISC by Does not apply route. Use to test blood sugars once daily   ramipril (ALTACE) 10 MG capsule TAKE 1 CAPSULE BY MOUTH EVERY DAY   rosuvastatin (CRESTOR) 5 MG tablet TAKE 1/2 TABLET BY MOUTH DAILY (Patient taking differently: Take 1.25 mg by mouth daily.)     Allergies:   Crestor [rosuvastatin], Lipitor [atorvastatin], Pravastatin sodium, Sulfonamide derivatives, Zetia [ezetimibe], Zocor [simvastatin], and Niacin   Social History   Socioeconomic History   Marital status: Married    Spouse name: Not on file   Number of  children: 3   Years of education: 13   Highest education level: High school graduate  Occupational History   Occupation: Producer, television/film/video: [employer]    Comment: sits most of day   Tobacco Use   Smoking status: Never   Smokeless tobacco: Never  Vaping Use   Vaping Use: Never used  Substance and Sexual Activity   Alcohol use: Never   Drug use: Never   Sexual activity: Not Currently  Other Topics Concern   Not on file  Social History Narrative   Married, 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 One caffeinated drink daily.Updated as of 04/30/2013   Social Determinants of Health   Financial Resource Strain: Not on file  Food Insecurity: Not on file  Transportation Needs: Not on file  Physical Activity: Not on file  Stress: Not on file  Social Connections: Not on file     Family History: The patient's family history includes Hyperlipidemia in her brother, mother, and sister; Neurofibromatosis in her father; Thyroid disease in her daughter. There is no history of Diabetes, Stroke, Colon cancer, Esophageal cancer, Pancreatic cancer, or Liver cancer.  ROS:   Please see the history of present illness.    All other systems reviewed and are negative.  EKGs/Labs/Other Studies Reviewed:    The following studies were reviewed today:  TAVR OPERATIVE NOTE     Date of Procedure:                06/21/2021   Preoperative Diagnosis:      Severe Aortic Stenosis    Postoperative Diagnosis:    Same    Procedure:        Transcatheter Aortic Valve Replacement - Transfemoral Approach             Edwards Sapien 3 Resilia THV (size 23 mm, model # 9755RLS, serial #[id number])              Co-Surgeons:                         Gaye Pollack, MD and Lenna Sciara, MD Anesthesiologist:  Echocardiographer:              Adele Barthel, MD   Pre-operative Echo Findings: Severe aortic stenosis Normal left ventricular systolic function   Post-operative Echo  Findings: Trace paravalvular leak Normal left ventricular systolic function   Echocardiogram 06/22/21:    1. Left ventricular ejection fraction, by estimation, is 60 to 65%. The  left ventricle has normal function. The left ventricle has no regional  wall motion abnormalities. There is mild concentric left ventricular  hypertrophy. Left ventricular diastolic  parameters are indeterminate.   2. Right ventricular systolic function is normal. The right ventricular  size is normal.   3. The mitral valve is degenerative. Mild mitral valve regurgitation. No  evidence of mitral stenosis. Moderate mitral annular calcification.   4. Well seated bioprosthetic aortic valve. Aortic valve regurgitation is  not visualized. No aortic stenosis is present. There is a 23 mm Sapien  prosthetic (TAVR) valve present in the aortic position. Aortic valve mean  gradient measures 12.0 mmHg. Aortic   valve Vmax measures 2.30 m/s.   5. The inferior vena cava is normal in size with greater than 50%  respiratory variability, suggesting right atrial pressure of 3 mmHg.     EKG:  EKG is  ordered today.  The ekg ordered today demonstrates NSR with HR 66bpm and new RBBB when compared to prior tracings.   Recent Labs: 05/26/2021: TSH 0.38 06/20/2021: B Natriuretic Peptide 109.1 06/22/2021: ALT 26 06/23/2021: BUN 13; Creatinine, Ser 0.62; Hemoglobin 12.4; Platelets 170; Potassium 3.8; Sodium 138   Recent Lipid Panel    Component Value Date/Time   CHOL 190 05/26/2021 0921   CHOL 188 04/22/2019 1521   TRIG 125.0 05/26/2021 0921   HDL 46.90 05/26/2021 0921   HDL 44 04/22/2019 1521   CHOLHDL 4 05/26/2021 0921   VLDL 25.0 05/26/2021 0921   LDLCALC 118 (H) 05/26/2021 0921   LDLCALC 115 (H) 04/22/2019 1521   LDLDIRECT 209 (H) 03/19/2018 1643   LDLDIRECT 170.0 12/11/2016 0943    Physical Exam:    VS:  BP 122/72    Pulse 66    Ht $R'5\' 3"'ax$  (1.6 m)    Wt 149 lb (67.6 kg)    SpO2 98%    BMI 26.39 kg/m     Wt Readings from  Last 3 Encounters:  07/01/21 149 lb (67.6 kg)  06/23/21 149 lb 0.5 oz (67.6 kg)  06/20/21 151 lb 8 oz (68.7 kg)    General: Well developed, well nourished, NAD Neck: Negative for carotid bruits. No JVD Lungs:Clear to ausculation bilaterally. Breathing is unlabored. Cardiovascular: RRR with S1 S2. Soft flow murmur Extremities: No edema.  Neuro: Alert and oriented. No focal deficits. No facial asymmetry. MAE spontaneously. Psych: Responds to questions appropriately with normal affect.    ASSESSMENT/PLAN:    Severe AS: s/p successful TAVR with a 23 mm Edwards Sapien 3SU THV via the TF approach on 06/21/21. Post operative echo with well seated bioprosthetic aortic valve with a mean gradient measuring 58mmHg, no mention of PVL on echo reading. Groin site looks great today with no s/s bleeding or hematoma. ECG with NSR however noted to have new RBBB. She is completely asymptomatic with no dizziness. Continue ASA 81 and Plavix (until 07/12/21) then stop Plavix and continue ASA monotherapy. SBE discussed. Amoxicillin RX'ed today to preferred pharmacy. Follow up with our team in one month with echocardiogram.    New diplopia with hx of thalamic CVA 2017: Reported new diplopia, with  associated dizziness with ambulation, concerning given hx of prior CVA from 2017. Head CT 06/22/21 with no acute findings. Seen by neurology 06/22/21 with brain MRI w/o contrast showing no acute intracranial process with no etiology seen for patients diplopia. Plan was to add Plavix 80m PO QD for 3 weeks to regimen along with ASA 886mPO QD. She will stop Plavix 07/12/21 and continue ASA monotherapy. She has follow up with neurology 4/26. Continue with vestibular exercises. No new symptoms. Continued improvement.    HTN: Stable, no changes needed.    DM2: Restarted on home regimen. Follow with PCP. Last Hb A1c, 6.2 from 05/26/21   CAD s/p DES to mLAD/dLAD (2019): Denies anginal symptoms. Continue ASA, statin. Plavix added as above.     HLD: Last LDL, 113>>above goal of less than 70. Noted to have severe intolerance to statins. Recommended that she follow with lipid clinic for PCSK9 inhibitor however she has deferred and wishes to attempt control with diet and exercise. Will continue to re-address.   Medication Adjustments/Labs and Tests Ordered: Current medicines are reviewed at length with the patient today.  Concerns regarding medicines are outlined above.  Orders Placed This Encounter  Procedures   EKG 12-Lead   Meds ordered this encounter  Medications   amoxicillin (AMOXIL) 500 MG tablet    Sig: Take 4 tablets (2,000 mg total) by mouth as directed. 1 HOUR PRIOR TO DENTAL APPOINTMENTS    Dispense:  12 tablet    Refill:  16    Patient Instructions  Medication Instructions:  Your physician has recommended you make the following change in your medication:  START AMOXICILLIN 500 MG : TAKE 2000 MG (4 TABLETS) 1 HOUR PRIOR TO DENTAL APPOINTMENTS    STOP PLAVIX ON 07/12/21 *If you need a refill on your cardiac medications before your next appointment, please call your pharmacy*   Lab Work: NONE If you have labs (blood work) drawn today and your tests are completely normal, you will receive your results only by: MyPaged[place]if you have MyChart) OR A paper copy in the mail If you have any lab test that is abnormal or we need to change your treatment, we will call you to review the results.   Testing/Procedures: NONE   Follow-Up: KEEP SCHEDULED FOLLOW UP APPOINTMENT   Signed, JiKathyrn DrownNP  07/01/2021 1:50 PM    [facility]

## 2021-07-01 ENCOUNTER — Ambulatory Visit (INDEPENDENT_AMBULATORY_CARE_PROVIDER_SITE_OTHER): Payer: Medicare Other | Admitting: Cardiology

## 2021-07-01 ENCOUNTER — Other Ambulatory Visit: Payer: Self-pay

## 2021-07-01 VITALS — BP 122/72 | HR 66 | Ht 63.0 in | Wt 149.0 lb

## 2021-07-01 DIAGNOSIS — E119 Type 2 diabetes mellitus without complications: Secondary | ICD-10-CM

## 2021-07-01 DIAGNOSIS — E785 Hyperlipidemia, unspecified: Secondary | ICD-10-CM | POA: Diagnosis not present

## 2021-07-01 DIAGNOSIS — I10 Essential (primary) hypertension: Secondary | ICD-10-CM | POA: Diagnosis not present

## 2021-07-01 DIAGNOSIS — H532 Diplopia: Secondary | ICD-10-CM | POA: Diagnosis not present

## 2021-07-01 DIAGNOSIS — I639 Cerebral infarction, unspecified: Secondary | ICD-10-CM | POA: Diagnosis not present

## 2021-07-01 DIAGNOSIS — I2510 Atherosclerotic heart disease of native coronary artery without angina pectoris: Secondary | ICD-10-CM | POA: Diagnosis not present

## 2021-07-01 DIAGNOSIS — I350 Nonrheumatic aortic (valve) stenosis: Secondary | ICD-10-CM

## 2021-07-01 DIAGNOSIS — Z952 Presence of prosthetic heart valve: Secondary | ICD-10-CM | POA: Diagnosis not present

## 2021-07-01 MED ORDER — AMOXICILLIN 500 MG PO TABS: 2000.0000 mg | ORAL_TABLET | ORAL | 16 refills | Status: AC

## 2021-07-01 NOTE — Patient Instructions (Addendum)
Medication Instructions:  Your physician has recommended you make the following change in your medication:  START AMOXICILLIN 500 MG : TAKE 2000 MG (4 TABLETS) 1 HOUR PRIOR TO DENTAL APPOINTMENTS    STOP PLAVIX ON 07/12/21 *If you need a refill on your cardiac medications before your next appointment, please call your pharmacy*   Lab Work: NONE If you have labs (blood work) drawn today and your tests are completely normal, you will receive your results only by: [facility] (if you have MyChart) OR A paper copy in the mail If you have any lab test that is abnormal or we need to change your treatment, we will call you to review the results.   Testing/Procedures: NONE   Follow-Up: KEEP SCHEDULED FOLLOW UP APPOINTMENT

## 2021-07-15 ENCOUNTER — Other Ambulatory Visit: Payer: Self-pay | Admitting: Cardiology

## 2021-07-25 ENCOUNTER — Other Ambulatory Visit: Payer: Self-pay

## 2021-07-25 ENCOUNTER — Ambulatory Visit (INDEPENDENT_AMBULATORY_CARE_PROVIDER_SITE_OTHER): Payer: Medicare Other | Admitting: Cardiology

## 2021-07-25 ENCOUNTER — Encounter (HOSPITAL_COMMUNITY): Payer: Self-pay

## 2021-07-25 ENCOUNTER — Ambulatory Visit (HOSPITAL_COMMUNITY): Payer: Medicare Other | Attending: Internal Medicine

## 2021-07-25 VITALS — BP 122/68 | HR 65 | Ht 63.5 in | Wt 158.0 lb

## 2021-07-25 DIAGNOSIS — Z952 Presence of prosthetic heart valve: Secondary | ICD-10-CM

## 2021-07-25 DIAGNOSIS — E785 Hyperlipidemia, unspecified: Secondary | ICD-10-CM | POA: Diagnosis not present

## 2021-07-25 DIAGNOSIS — I10 Essential (primary) hypertension: Secondary | ICD-10-CM | POA: Diagnosis not present

## 2021-07-25 DIAGNOSIS — E119 Type 2 diabetes mellitus without complications: Secondary | ICD-10-CM

## 2021-07-25 DIAGNOSIS — I639 Cerebral infarction, unspecified: Secondary | ICD-10-CM

## 2021-07-25 DIAGNOSIS — I350 Nonrheumatic aortic (valve) stenosis: Secondary | ICD-10-CM

## 2021-07-25 DIAGNOSIS — H532 Diplopia: Secondary | ICD-10-CM

## 2021-07-25 NOTE — Patient Instructions (Signed)
Medication Instructions:  ?Your physician recommends that you continue on your current medications as directed. Please refer to the Current Medication list given to you today.  ?*If you need a refill on your cardiac medications before your next appointment, please call your pharmacy* ? ? ?Lab Work: ?NONE ?If you have labs (blood work) drawn today and your tests are completely normal, you will receive your results only by: ?MyChart Message (if you have MyChart) OR ?A paper copy in the mail ?If you have any lab test that is abnormal or we need to change your treatment, we will call you to review the results. ? ? ?Testing/Procedures: ?NONE ? ? ?Follow-Up: ?At [facility], you and your health needs are our priority.  As part of our continuing mission to provide you with exceptional heart care, we have created designated Provider Care Teams.  These Care Teams include your primary Cardiologist (physician) and Advanced Practice Providers (APPs -  Physician Assistants and Nurse Practitioners) who all work together to provide you with the care you need, when you need it. ? ?We recommend signing up for the patient portal called "MyChart".  Sign up information is provided on this After Visit Summary.  MyChart is used to connect with patients for Virtual Visits (Telemedicine).  Patients are able to view lab/test results, encounter notes, upcoming appointments, etc.  Non-urgent messages can be sent to your provider as well.   ?To learn more about what you can do with MyChart, go to [email or web address].   ? ?Your next appointment:   ?July OR AUGUST 2023 ? ?The format for your next appointment:   ?In Person ? ?Provider:   ?Candee Furbish, MD { ? ?

## 2021-07-25 NOTE — Progress Notes (Signed)
HEART AND [place]                                     Cardiology Office Note:    Date:  07/25/2021   ID:  Joanna [name], DOB [birth year], MRN [id number]  PCP:  Pcp, No  [facility] Cardiologist:  Candee Furbish, MD  [place] Electrophysiologist:  None   Referring MD: No ref. provider found   Chief Complaint  Patient presents with   Follow-up    1 month s/p TAVR   History of Present Illness:    Joanna [name] is a 71 y.o. female with a hx of hypertension, hyperlipidemia, hypothyroidism, type 2 diabetes, previous right thalamic stroke in 04/2016 felt to be due to small vessel disease without residual deficit, coronary artery disease status post DES to the mid LAD in 2019, and moderate to severe aortic stenosis. Echocardiogram in 08/2020 showed a mean aortic valve gradient of 36.3 mmHg with a peak gradient of 65.5 mmHg. Valve area was 0.63 cm. Left ventricular ejection fraction is 65 to 70%. She was referred by Dr. Marlou Porch due to a 78-monthhistory of progressive exertional fatigue and shortness of breath. She had been unable to perform much activity due to her symptoms. A repeat echo 03/24/2021 showed an increase in the mean gradient across aortic valve to 54 mmHg with a valve area of 0.58 cm with normal LVEF. [facility] LHC from 05/04/22 showed normal filling pressures, cardiac output, cardiac index. Patent distal LAD stent with one-vessel ostial second diagonal disease with mild to moderate disease elsewhere. Recommendations were to continue with medical therapy.    She was evaluated by the multidisciplinary valve team, including Dr. BCyndia Bentwith TCTS surgery and felt to have severe, symptomatic aotic stenosis and to be a suitable candidate for TAVR. She ultimately underwent  successful TAVR with a 23 mm Edwards Sapien 3SU THV via the TF approach on 06/21/21. Post operative echo with well seated  bioprosthetic aortic valve with a mean gradient measuring 140mg, no mention of PVL on echo reading. Groin sites with stable with no bleeding or hematoma. Plan initially was for ASA monotherapy but unfortunately she had symptoms of new diplopia with associated dizziness with ambulation. No other neuro changes however concerning given hx of prior CVA from 2017. Head CT 06/22/21 with no acute findings. Neurology was consulted 06/22/21 with brain MRI showing no acute intracranial process with no etiology seen for patients diplopia. Plan was to add Plavix 7527mO QD to regimen along with ASA 20m77m QD. OP neurology referral was placed at discharge with appointment set for 4/26.   She was seen for TOC follow up and was doing well. She had improved symptoms. Today she is here alone. She missed her 1 month echocardiogram therefore we will reschedule this today prior to her leaving the office. Otherwise she feels great with no complaints today. She has been walking about 1 mile per day for exercise. She is back to work now. [place] she is excited about. She has had no chest pain, palpitations, LE edema, orthopnea, dizziness, change in vision, or syncope. She will still at times have mild weakness down her left arm when under more stress however this is residual from her stroke.  Past Medical History:  Diagnosis Date   Abnormal liver function tests    Adenomatous colon polyp    CAD (coronary artery disease) 05/09/2018   a. LHC 05/09/18: DES to mid/dist LAD, mod AS   Cerebrovascular disease    a. CT 04/2016 -calcific plaque at the origin LEFT vertebral contributing to severe stenosis.   Diverticulitis of colon    Fatty liver    a. mild fatty liver infiltration by CT 2014.   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 TAli Loweand Dr. BCyndia Bent  Statin intolerance    Stroke ([facility]    a. right thalamic infarct in 04/2016 felt by neuro to be due to small vessel disease.   Type II diabetes mellitus ([place]     Past Surgical History:  Procedure Laterality Date   CARDIAC CATHETERIZATION     CATARACT EXTRACTION W/ INTRAOCULAR LENS  IMPLANT, BILATERAL Bilateral 08/2016   COLONOSCOPY W/ BIOPSIES AND POLYPECTOMY  05/19/2005   adenomatous polyps   CORONARY STENT INTERVENTION N/A 05/09/2018   Procedure: CORONARY STENT INTERVENTION;  Surgeon: ENelva Bush MD;  [place];  Service: Cardiovascular;  Laterality: N/A;   INTRAOPERATIVE TRANSTHORACIC ECHOCARDIOGRAM N/A 06/21/2021   Procedure: INTRAOPERATIVE TRANSTHORACIC ECHOCARDIOGRAM;  Surgeon: TEarly Osmond MD;  [place]  Service: Open Heart Surgery;  Laterality: N/A;   LEFT HEART CATH AND CORONARY ANGIOGRAPHY N/A 05/09/2018   Procedure: LEFT HEART CATH AND CORONARY ANGIOGRAPHY;  Surgeon: ENelva Bush MD;  [facility];  Service: Cardiovascular;  Laterality: N/A;   MELANOMA EXCISION  1980s   mid upper stomach   NEPHROLITHOTOMY Left 01/27/2013   Procedure: NEPHROLITHOTOMY PERCUTANEOUS;  Surgeon: LDutch Gray MD;  Location[facility];  Service: Urology;  Laterality: Left;   RIGHT/LEFT HEART CATH AND CORONARY ANGIOGRAPHY N/A 05/04/2021   Procedure: RIGHT/LEFT HEART CATH AND CORONARY ANGIOGRAPHY;  Surgeon: TEarly Osmond MD;  [facility];  Service: Cardiovascular;  Laterality: N/A;   TRANSCATHETER AORTIC VALVE REPLACEMENT, TRANSFEMORAL N/A 06/21/2021   Procedure: TRANSCATHETER AORTIC VALVE REPLACEMENT, TRANSFEMORAL USING A 23 MM EDWARDS SAPIEN 3 ULTRA  AORTIC VALVE;  Surgeon: TEarly Osmond MD;   [facility] [place] [place]  Service: Open Heart Surgery;  Laterality: N/A;   TUBAL LIGATION     WISDOM TEETH EXTRACTED      Current Medications: Current Meds  Medication Sig   amLODipine (NORVASC) 10 MG tablet TAKE 1/2 TABLET BY MOUTH EVERY DAY   amoxicillin (AMOXIL) 500 MG tablet Take 4 tablets (2,000 mg total) by mouth as directed. 1 HOUR PRIOR TO DENTAL APPOINTMENTS   aspirin EC 81 MG tablet Take 1 tablet (81 mg total) by mouth daily.   Blood Glucose Monitoring Suppl (ONETOUCH VERIO) w/Device KIT by Does not apply route. Use to check blood sugars once daily.   carvedilol (COREG) 6.25 MG tablet TAKE 1 TABLET BY MOUTH TWICE A DAY   glimepiride (AMARYL) 1 MG tablet TAKE 1 TABLET BY MOUTH EVERYDAY AT BEDTIME   glucose blood (ONE TOUCH ULTRA TEST) test  strip USE TO CHECK BLOOD SUGAR TWICE DAILY  Dx:E11.65   levothyroxine (SYNTHROID) 125 MCG tablet 1 tab daily except 1/2 on Sundays   Magnesium 500 MG TABS Take 500 mg by mouth 2 (two) times daily.   metFORMIN (GLUCOPHAGE) 1000 MG tablet TAKE 1 TABLET BY MOUTH TWICE A DAY   nitroGLYCERIN (NITROSTAT) 0.4 MG SL tablet Place 1 tablet (0.4 mg total) under the tongue every 5 (five) minutes x 3 doses as needed for chest pain.   OneTouch Delica Lancets 72I MISC by Does not apply route. Use to test blood sugars once daily   ramipril (ALTACE) 10 MG capsule TAKE 1 CAPSULE BY MOUTH EVERY DAY   rosuvastatin (CRESTOR) 5 MG tablet Take 2.5 mg by mouth every other day.   [DISCONTINUED] Lancets (ONETOUCH DELICA PLUS [id number]) MISC Use to test blood sugars once daily.     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 One caffeinated drink daily.Updated as of 04/30/2013   Social Determinants of Health   Financial Resource Strain: Not on file  Food Insecurity: Not on file  Transportation Needs: Not on file  Physical Activity: Not on file  Stress: Not on file  Social Connections: Not on file     Family History: The patient's family history includes Hyperlipidemia in her brother, mother, and sister; Neurofibromatosis in her father; Thyroid disease in her daughter. There is no history of Diabetes, Stroke, Colon cancer, Esophageal cancer, Pancreatic cancer, or Liver cancer.  ROS:   Please see the history of present illness.    All other systems reviewed and are negative.  EKGs/Labs/Other Studies Reviewed:    The following studies were reviewed today:  TAVR OPERATIVE NOTE     Date of Procedure:                06/21/2021   Preoperative Diagnosis:      Severe Aortic Stenosis    Postoperative Diagnosis:    Same    Procedure:        Transcatheter Aortic Valve Replacement - Transfemoral Approach             Edwards Sapien 3 Resilia THV (size 23 mm, model # 9755RLS, serial #[id number])              Co-Surgeons:                         Gaye Pollack, MD and Lenna Sciara, MD Anesthesiologist:                     Echocardiographer:              Thurmond Butts  Ellender, MD   Pre-operative Echo Findings: Severe aortic stenosis Normal left ventricular systolic function   Post-operative Echo Findings: Trace paravalvular leak Normal left ventricular systolic function   Echocardiogram 06/22/21:    1. Left ventricular ejection  fraction, by estimation, is 60 to 65%. The  left ventricle has normal function. The left ventricle has no regional  wall motion abnormalities. There is mild concentric left ventricular  hypertrophy. Left ventricular diastolic  parameters are indeterminate.   2. Right ventricular systolic function is normal. The right ventricular  size is normal.   3. The mitral valve is degenerative. Mild mitral valve regurgitation. No  evidence of mitral stenosis. Moderate mitral annular calcification.   4. Well seated bioprosthetic aortic valve. Aortic valve regurgitation is  not visualized. No aortic stenosis is present. There is a 23 mm Sapien  prosthetic (TAVR) valve present in the aortic position. Aortic valve mean  gradient measures 12.0 mmHg. Aortic   valve Vmax measures 2.30 m/s.   5. The inferior vena cava is normal in size with greater than 50%  respiratory variability, suggesting right atrial pressure of 3 mmHg.    EKG:  EKG is not ordered today.    Recent Labs: 05/26/2021: TSH 0.38 06/20/2021: B Natriuretic Peptide 109.1 06/22/2021: ALT 26 06/23/2021: BUN 13; Creatinine, Ser 0.62; Hemoglobin 12.4; Platelets 170; Potassium 3.8; Sodium 138   Recent Lipid Panel    Component Value Date/Time   CHOL 190 05/26/2021 0921   CHOL 188 04/22/2019 1521   TRIG 125.0 05/26/2021 0921   HDL 46.90 05/26/2021 0921   HDL 44 04/22/2019 1521   CHOLHDL 4 05/26/2021 0921   VLDL 25.0 05/26/2021 0921   LDLCALC 118 (H) 05/26/2021 0921   LDLCALC 115 (H) 04/22/2019 1521   LDLDIRECT 209 (H) 03/19/2018 1643   LDLDIRECT 170.0 12/11/2016 0943   Physical Exam:    VS:  BP 122/68    Pulse 65    Ht 5' 3.5" (1.613 m)    Wt 158 lb (71.7 kg)    SpO2 97%    BMI 27.55 kg/m     Wt Readings from Last 3 Encounters:  07/25/21 158 lb (71.7 kg)  07/01/21 149 lb (67.6 kg)  06/23/21 149 lb 0.5 oz (67.6 kg)    General: Well developed, well nourished, NAD Neck: Negative for carotid bruits. No JVD Lungs:Clear to ausculation  bilaterally.  Cardiovascular: RRR with S1 S2. No murmur Extremities: No edema.  Neuro: Alert and oriented. No focal deficits. No facial asymmetry. MAE spontaneously. Psych: Responds to questions appropriately with normal affect.    ASSESSMENT/PLAN:    Severe AS: s/p successful TAVR with a 23 mm Edwards Sapien 3SU THV via the TF approach on 06/21/21. Post operative echo with well seated bioprosthetic aortic valve with a mean gradient measuring 87mmHg, no mention of PVL on echo reading. She was to have 1 month echo today however missed this appointment. This has been rescheduled. She has been feeling great with NYHA Class I symptoms. Continue ASA 81 monotherapy. Plavix started in the hospital has since been discontinued (07/12/21). Amoxicillin RX'ed today to preferred pharmacy. NYHA class I symptoms. Plan follow up in 1 year with echocardiogram. Will have her seen Dr. Marlou Porch in 6 months.    Diplopia with hx of thalamic CVA 2017: Reported new diplopia after TAVR procedure. Symptoms have completely resolved. She has follow up with neurology 4/26 however reports she may cancel given symptom improvement. Continue ASA monotherapy. She completed 3 week course of Plavix per  neurology direction after procedure.    HTN: Stable, no changes needed.    DM2: Follow with PCP. Last Hb A1c, 6.2 from 05/26/21   CAD s/p DES to mLAD/dLAD (2019): Denies anginal symptoms. Continue ASA, statin.    HLD: Last LDL, 113>>above goal of less than 70. Noted to have severe intolerance to statins. Recommended that she follow with lipid clinic for PCSK9 inhibitor however she has deferred and wishes to attempt control with diet and exercise. Will continue to re-address.   Medication Adjustments/Labs and Tests Ordered: Current medicines are reviewed at length with the patient today.  Concerns regarding medicines are outlined above.  No orders of the defined types were placed in this encounter.  No orders of the defined types were  placed in this encounter.   Patient Instructions  Medication Instructions:  Your physician recommends that you continue on your current medications as directed. Please refer to the Current Medication list given to you today.  *If you need a refill on your cardiac medications before your next appointment, please call your pharmacy*   Lab Work: NONE If you have labs (blood work) drawn today and your tests are completely normal, you will receive your results only by: [place] (if you have MyChart) OR A paper copy in the mail If you have any lab test that is abnormal or we need to change your treatment, we will call you to review the results.   Testing/Procedures: NONE   Follow-Up: At [facility], you and your health needs are our priority.  As part of our continuing mission to provide you with exceptional heart care, we have created designated Provider Care Teams.  These Care Teams include your primary Cardiologist (physician) and Advanced Practice Providers (APPs -  Physician Assistants and Nurse Practitioners) who all work together to provide you with the care you need, when you need it.  We recommend signing up for the patient portal called "MyChart".  Sign up information is provided on this After Visit Summary.  MyChart is used to connect with patients for Virtual Visits (Telemedicine).  Patients are able to view lab/test results, encounter notes, upcoming appointments, etc.  Non-urgent messages can be sent to your provider as well.   To learn more about what you can do with MyChart, go to [email or web address].    Your next appointment:   July OR AUGUST 2023  The format for your next appointment:   In Person  Provider:   Candee Furbish, MD {    Signed, Kathyrn Drown, NP  07/25/2021 12:58 PM    [place]

## 2021-08-02 ENCOUNTER — Ambulatory Visit (HOSPITAL_COMMUNITY): Payer: Medicare Other | Attending: Cardiovascular Disease

## 2021-08-02 ENCOUNTER — Other Ambulatory Visit: Payer: Self-pay

## 2021-08-02 DIAGNOSIS — Z952 Presence of prosthetic heart valve: Secondary | ICD-10-CM

## 2021-08-02 LAB — ECHOCARDIOGRAM COMPLETE
Area-P 1/2: 2.8 cm2
MV VTI: 1.66 cm2
S' Lateral: 1.9 cm

## 2021-08-08 ENCOUNTER — Telehealth (HOSPITAL_COMMUNITY): Payer: Self-pay | Admitting: Radiology

## 2021-08-08 NOTE — Telephone Encounter (Signed)
Left message for patient to call back. Patient needs additional images for echocardiogram. We need to schedule her, with no charge to the patient. ?

## 2021-08-12 ENCOUNTER — Telehealth (HOSPITAL_COMMUNITY): Payer: Self-pay | Admitting: Radiology

## 2021-08-12 NOTE — Telephone Encounter (Signed)
Called patient to schedule additional imaging for echocardiogram. Call went directly to voice mail. I left message to please return phone call need to schedule additional imaging at no charge. ?

## 2021-08-18 ENCOUNTER — Other Ambulatory Visit: Payer: Self-pay | Admitting: Cardiology

## 2021-08-18 ENCOUNTER — Other Ambulatory Visit: Payer: Self-pay | Admitting: Endocrinology

## 2021-09-14 ENCOUNTER — Ambulatory Visit: Payer: Medicare Other | Admitting: Neurology

## 2021-09-19 ENCOUNTER — Other Ambulatory Visit: Payer: Self-pay

## 2021-09-19 ENCOUNTER — Encounter (HOSPITAL_COMMUNITY): Payer: Self-pay

## 2021-09-19 ENCOUNTER — Telehealth: Payer: Self-pay

## 2021-09-19 DIAGNOSIS — I350 Nonrheumatic aortic (valve) stenosis: Secondary | ICD-10-CM

## 2021-09-19 DIAGNOSIS — Z952 Presence of prosthetic heart valve: Secondary | ICD-10-CM

## 2021-09-19 NOTE — Telephone Encounter (Signed)
Joanna [name] needs additional echo images taken to further assess her aortic valve s/p TAVR.  On 3/14 echo an AVA and gradient could not be obtained. We requested that the Joanna [name] come back and have additional images taken at no cost.  Lorriane Shire with Echo has attempted to reach the Joanna [name] without a return call.  I did speak to the Joanna [name] this morning and made her aware that she needs additional images of her valve. She agreed to coming back to the office, but will be out of town this week.  I made her aware that Lattie Haw in scheduling will contact her in the next few minutes to schedule limited echo.  Lattie Haw called the Joanna [name] and the Joanna [name] did not answer, Lattie Haw left her a Advertising account executive.  ?

## 2021-09-29 ENCOUNTER — Other Ambulatory Visit (HOSPITAL_COMMUNITY): Payer: Medicare Other

## 2021-10-07 ENCOUNTER — Ambulatory Visit (HOSPITAL_COMMUNITY): Payer: Medicare Other | Attending: Cardiology

## 2021-10-07 ENCOUNTER — Telehealth: Payer: Self-pay

## 2021-10-07 DIAGNOSIS — Z952 Presence of prosthetic heart valve: Secondary | ICD-10-CM | POA: Insufficient documentation

## 2021-10-07 DIAGNOSIS — I350 Nonrheumatic aortic (valve) stenosis: Secondary | ICD-10-CM | POA: Diagnosis not present

## 2021-10-07 LAB — ECHOCARDIOGRAM LIMITED
AR max vel: 1.19 cm2
AV Area VTI: 1.33 cm2
AV Area mean vel: 1.25 cm2
AV Mean grad: 11 mmHg
AV Peak grad: 21.8 mmHg
Ao pk vel: 2.34 m/s
Area-P 1/2: 2.12 cm2
S' Lateral: 3 cm

## 2021-10-07 NOTE — Telephone Encounter (Signed)
Attempted phone call to pt.to discuss echo results  Left voicemail message to contact triage at [phone].

## 2021-10-07 NOTE — Telephone Encounter (Signed)
-----   Message from Tommie Raymond, NP sent at 10/07/2021 12:25 PM EDT ----- Please let the patient know that her echocardiogram looks great with stable gradients and good valve function.

## 2021-10-17 ENCOUNTER — Other Ambulatory Visit: Payer: Self-pay | Admitting: Cardiology

## 2021-11-16 DIAGNOSIS — H43813 Vitreous degeneration, bilateral: Secondary | ICD-10-CM | POA: Diagnosis not present

## 2021-11-16 DIAGNOSIS — E119 Type 2 diabetes mellitus without complications: Secondary | ICD-10-CM | POA: Diagnosis not present

## 2021-11-16 DIAGNOSIS — H04123 Dry eye syndrome of bilateral lacrimal glands: Secondary | ICD-10-CM | POA: Diagnosis not present

## 2021-11-16 DIAGNOSIS — H524 Presbyopia: Secondary | ICD-10-CM | POA: Diagnosis not present

## 2021-12-01 ENCOUNTER — Ambulatory Visit (INDEPENDENT_AMBULATORY_CARE_PROVIDER_SITE_OTHER): Payer: Medicare Other | Admitting: Cardiology

## 2021-12-01 ENCOUNTER — Encounter: Payer: Self-pay | Admitting: Cardiology

## 2021-12-01 ENCOUNTER — Other Ambulatory Visit (INDEPENDENT_AMBULATORY_CARE_PROVIDER_SITE_OTHER): Payer: Medicare Other

## 2021-12-01 DIAGNOSIS — E559 Vitamin D deficiency, unspecified: Secondary | ICD-10-CM

## 2021-12-01 DIAGNOSIS — H532 Diplopia: Secondary | ICD-10-CM | POA: Diagnosis not present

## 2021-12-01 DIAGNOSIS — Z952 Presence of prosthetic heart valve: Secondary | ICD-10-CM

## 2021-12-01 DIAGNOSIS — E063 Autoimmune thyroiditis: Secondary | ICD-10-CM

## 2021-12-01 DIAGNOSIS — E785 Hyperlipidemia, unspecified: Secondary | ICD-10-CM | POA: Diagnosis not present

## 2021-12-01 DIAGNOSIS — I10 Essential (primary) hypertension: Secondary | ICD-10-CM | POA: Diagnosis not present

## 2021-12-01 DIAGNOSIS — E1165 Type 2 diabetes mellitus with hyperglycemia: Secondary | ICD-10-CM | POA: Diagnosis not present

## 2021-12-01 DIAGNOSIS — I2510 Atherosclerotic heart disease of native coronary artery without angina pectoris: Secondary | ICD-10-CM

## 2021-12-01 LAB — COMPREHENSIVE METABOLIC PANEL
ALT: 38 U/L — ABNORMAL HIGH (ref 0–35)
AST: 25 U/L (ref 0–37)
Albumin: 4.9 g/dL (ref 3.5–5.2)
Alkaline Phosphatase: 72 U/L (ref 39–117)
BUN: 18 mg/dL (ref 6–23)
CO2: 29 mEq/L (ref 19–32)
Calcium: 10.1 mg/dL (ref 8.4–10.5)
Chloride: 102 mEq/L (ref 96–112)
Creatinine, Ser: 0.66 mg/dL (ref 0.40–1.20)
GFR: 89.65 mL/min (ref >60.00)
Glucose, Bld: 112 mg/dL — ABNORMAL HIGH (ref 70–99)
Potassium: 5 mEq/L (ref 3.5–5.1)
Sodium: 138 mEq/L (ref 135–145)
Total Bilirubin: 0.9 mg/dL (ref 0.2–1.2)
Total Protein: 8.2 g/dL (ref 6.0–8.3)

## 2021-12-01 LAB — T4, FREE: Free T4: 1.16 ng/dL (ref 0.60–1.60)

## 2021-12-01 LAB — TSH: TSH: 0.4 u[IU]/mL (ref 0.35–5.50)

## 2021-12-01 LAB — VITAMIN D 25 HYDROXY (VIT D DEFICIENCY, FRACTURES): VITD: 57.3 ng/mL (ref 30.00–100.00)

## 2021-12-01 LAB — MICROALBUMIN / CREATININE URINE RATIO
Creatinine,U: 65.2 mg/dL
Microalb Creat Ratio: 6.7 mg/g (ref 0.0–30.0)
Microalb, Ur: 4.4 mg/dL — ABNORMAL HIGH (ref 0.0–1.9)

## 2021-12-01 LAB — HEMOGLOBIN A1C: Hgb A1c MFr Bld: 6.6 % — ABNORMAL HIGH (ref 4.6–6.5)

## 2021-12-01 MED ORDER — AMLODIPINE BESYLATE 2.5 MG PO TABS: 2.5000 mg | ORAL_TABLET | Freq: Every day | ORAL | 3 refills | Status: DC

## 2021-12-01 NOTE — Assessment & Plan Note (Signed)
s/p successful TAVR with a78m Edwards Sapien 3SU THVvia the TF approach on1/31/23. Post operative echo with well seated bioprosthetic aortic valve with a mean gradient measuring 127mg, no mention of PVL on echo reading. Continue ASA monotherapy.SBE discussed

## 2021-12-01 NOTE — Assessment & Plan Note (Signed)
I will go ahead and send her to the lipid clinic to discuss other agents.  She is on low-dose Crestor 2.5 mg a day.

## 2021-12-01 NOTE — Patient Instructions (Signed)
Medication Instructions:  Please decrease your Amlodipine to 2.5 mg a day.  *If you need a refill on your cardiac medications before your next appointment, please call your pharmacy*  You have been referred to our [facility] here at our office.  Follow-Up: At [facility], you and your health needs are our priority.  As part of our continuing mission to provide you with exceptional heart care, we have created designated Provider Care Teams.  These Care Teams include your primary Cardiologist (physician) and Advanced Practice Providers (APPs -  Physician Assistants and Nurse Practitioners) who all work together to provide you with the care you need, when you need it.  We recommend signing up for the patient portal called "MyChart".  Sign up information is provided on this After Visit Summary.  MyChart is used to connect with patients for Virtual Visits (Telemedicine).  Patients are able to view lab/test results, encounter notes, upcoming appointments, etc.  Non-urgent messages can be sent to your provider as well.   To learn more about what you can do with MyChart, go to [email or web address].    Your next appointment:   Follow up as scheduled in [place] [facility].  Important Information About Sugar

## 2021-12-01 NOTE — Progress Notes (Signed)
Cardiology Office Note:    Date:  12/01/2021   ID:  Joanna [name], DOB 1950/07/25, MRN [id number]  PCP:  Kathyrn Lass   [facility] Providers Cardiologist:  Candee Furbish, MD     Referring MD: No ref. provider found     History of Present Illness:     Joanna [name] is a 71 y.o. female here for the follow-up of aortic stenosis s/p TAVR (06/21/2021), hypertension, and hyperlipidemia.  She last followed up with Kathyrn Drown, NP on 07/25/21. Per her notes, she ultimately underwent successful TAVR with a 23 mm Edwards Sapien 3SU THV via the TF approach on 06/21/21. Post operative echo showed well seated bioprosthetic aortic valve with a mean gradient measuring 50mHg, no mention of PVL on echo reading. Groin sites were stable with no bleeding or hematoma. Plan initially was for ASA monotherapy but she developed symptoms of new diplopia with associated dizziness with ambulation. Neurology was consulted 06/22/21 with brain MRI showing no acute intracranial process with no etiology seen for patient's diplopia. Plan was to add Plavix 71mPO QD to regimen along with ASA 8150mO QD.   Has coronary artery disease, prior LAD stent (2019) with moderate to severe aortic stenosis and prior stroke in 2018 with left arm periodic weakness.  Used to enjoy [place] without much difficulty.  Father replaced valve at 76.3  Her daughter was a nurMarine scientistd is back in college for her Masters and now is the womSecretary/administratorr [employer] sleep apnea and has used a CPAP for at least 20 years.   At her last appointment her L arm was feeling progressively tired and weak. This arm weakness started after her stroke incident in 2018.  It started bothering her constantly. She also noticed becoming more fatigued throughout the day. We discussed the progression of her aortic stenosis and 11/3 echo results. She was open to undergoing a valve replacement.   Today: She states that she is feeling good since her  TAVR. It took about 3 weeks for her to recover from her diplopia. She will still feel discomfort in her eye if she looks to the right quickly.  The other day she felt some chest discomfort, but believes this may have been triggered by eating too quickly. Generally she denies any chest pain or shortness of breath.  She continues to have intermittent LUE weakness with random and sudden onset. She has been unable to determine any clear triggers. This has not been as severe lately as it was in the past. However, it can still be bothersome.  Also she endorses mild swelling in her ankles.  For exercise she is walking routinely.  She denies any palpitations, or shortness of breath. No lightheadedness, headaches, syncope, orthopnea, or PND.   Past Medical History:  Diagnosis Date   Abnormal liver function tests    Adenomatous colon polyp    CAD (coronary artery disease) 05/09/2018   a. LHC 05/09/18: DES to mid/dist LAD, mod AS   Cerebrovascular disease    a. CT 04/2016 -calcific plaque at the origin LEFT vertebral contributing to severe stenosis.   Diverticulitis of colon    Fatty liver    a. mild fatty liver infiltration by CT 2014.   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 TAli Loweand Dr. BCyndia Bent  Statin intolerance    Stroke ([facility]    a. right thalamic infarct in 04/2016 felt by neuro to be due to small vessel disease.   Type II diabetes mellitus ([place]     Past Surgical History:  Procedure Laterality Date   CARDIAC CATHETERIZATION     CATARACT EXTRACTION W/ INTRAOCULAR LENS  IMPLANT, BILATERAL Bilateral 08/2016   COLONOSCOPY W/ BIOPSIES  AND POLYPECTOMY  05/19/2005   adenomatous polyps   CORONARY STENT INTERVENTION N/A 05/09/2018   Procedure: CORONARY STENT INTERVENTION;  Surgeon: ENelva Bush MD;  [facility];  Service: Cardiovascular;  Laterality: N/A;   INTRAOPERATIVE TRANSTHORACIC ECHOCARDIOGRAM N/A 06/21/2021   Procedure: INTRAOPERATIVE TRANSTHORACIC ECHOCARDIOGRAM;  Surgeon: TEarly Osmond MD;  [place] [facility]  Service: Open Heart Surgery;  Laterality: N/A;   LEFT HEART CATH AND CORONARY ANGIOGRAPHY N/A 05/09/2018   Procedure: LEFT HEART CATH AND CORONARY ANGIOGRAPHY;  Surgeon: ENelva Bush MD;  [facility];  Service: Cardiovascular;  Laterality: N/A;   MELANOMA EXCISION  1980s   mid upper stomach   NEPHROLITHOTOMY Left 01/27/2013   Procedure: NEPHROLITHOTOMY PERCUTANEOUS;  Surgeon: LDutch Gray MD;  [facility];  Service: Urology;  Laterality: Left;   RIGHT/LEFT HEART CATH AND CORONARY ANGIOGRAPHY N/A 05/04/2021   Procedure: RIGHT/LEFT HEART CATH AND CORONARY ANGIOGRAPHY;  Surgeon: TEarly Osmond MD;  [facility];  Service: Cardiovascular;  Laterality: N/A;   TRANSCATHETER AORTIC VALVE REPLACEMENT, TRANSFEMORAL N/A 06/21/2021   Procedure: TRANSCATHETER AORTIC VALVE REPLACEMENT, TRANSFEMORAL USING A 23 MM EDWARDS SAPIEN 3 ULTRA  AORTIC VALVE;  Surgeon: TEarly Osmond MD;  Location: [facility]  Service: Open Heart Surgery;  Laterality: N/A;   TUBAL LIGATION     WISDOM TEETH EXTRACTED      Current Medications: Current Meds  Medication Sig   amLODipine (NORVASC) 2.5 MG tablet Take 1 tablet (2.5 mg total) by mouth daily.   amoxicillin (AMOXIL) 500 MG tablet Take 4 tablets (2,000 mg total) by mouth as directed. 1 HOUR PRIOR TO DENTAL APPOINTMENTS   aspirin EC 81 MG tablet Take 1 tablet (81 mg total) by mouth daily.   Blood Glucose Monitoring Suppl (ONETOUCH VERIO) w/Device KIT by Does not apply route. Use to check blood sugars once daily.   carvedilol  (COREG) 6.25 MG tablet TAKE 1 TABLET BY MOUTH TWICE A DAY   glimepiride (AMARYL) 1 MG tablet TAKE 1 TABLET BY MOUTH EVERYDAY AT BEDTIME   glucose blood (ONE TOUCH ULTRA TEST) test strip USE TO CHECK BLOOD SUGAR TWICE DAILY  Dx:E11.65   levothyroxine (SYNTHROID) 125 MCG tablet 1 tab daily except 1/2 on Sundays   Magnesium 500 MG TABS Take 500 mg by mouth 2 (two) times daily.   metFORMIN (GLUCOPHAGE) 1000 MG tablet TAKE 1 TABLET BY MOUTH TWICE A DAY   nitroGLYCERIN (NITROSTAT) 0.4 MG SL tablet Place 1 tablet (0.4 mg total) under the tongue every 5 (five) minutes x 3 doses as needed for chest pain.   OneTouch Delica Lancets 353GMISC by Does not apply route. Use to test blood sugars once daily   ramipril (ALTACE) 10 MG capsule TAKE 1 CAPSULE BY MOUTH EVERY DAY  rosuvastatin (CRESTOR) 5 MG tablet TAKE 1/2 TABLET BY MOUTH EVERY DAY   TRULICITY 1.5 KN/3.9JQ SOPN Inject 1.5 mg into the skin once a week.   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 One caffeinated drink daily.Updated as of 04/30/2013   Social Determinants of Health   Financial Resource Strain: Low Risk   (07/11/2018)   Overall Financial Resource Strain (CARDIA)    Difficulty of Paying Living Expenses: Not hard at all  Food Insecurity: No Food Insecurity (07/11/2018)   Hunger Vital Sign    Worried About Running Out of Food in the Last Year: Never true    Ran Out of Food in the Last Year: Never true  Transportation Needs: No Transportation Needs (07/11/2018)   PRAPARE - Hydrologist (Medical): No    Lack of Transportation (Non-Medical): No  Physical Activity: Sufficiently Active (07/11/2018)   Exercise Vital Sign    Days of Exercise per Week: 5 days    Minutes of Exercise per Session: 30 min  Stress: No Stress Concern Present (07/11/2018)   [place]    Feeling of Stress : Not at all  Social Connections: Not on file     Family History: The patient's family history includes Hyperlipidemia in her brother, mother, and sister; Neurofibromatosis in her father; Thyroid disease in her daughter. There is no history of Diabetes, Stroke, Colon cancer, Esophageal cancer, Pancreatic cancer, or Liver cancer.  ROS:   Please see the history of present illness.    (+) LUE weakness (+) Mild ankle swelling All other systems reviewed and are negative.  EKGs/Labs/Other Studies Reviewed:    The following studies were reviewed today:  Echocardiogram  10/07/2021:  1. S/P TAVR with mean gradient 11 mmHg, DI 0.52 and no AI (normally  functioning prosthetic valve).   2. Left ventricular ejection fraction, by estimation, is >75%. The left  ventricle has hyperdynamic function. The left ventricle has no regional  wall motion abnormalities. There is mild left ventricular hypertrophy.  Left ventricular diastolic parameters  are consistent with Grade I diastolic dysfunction (impaired relaxation).  Elevated left atrial pressure.   3. Right ventricular systolic function is normal. The right ventricular  size is normal.    4. The mitral valve is normal in structure. Mild mitral valve  regurgitation. No evidence of mitral stenosis. Moderate mitral annular  calcification.   5. The aortic valve has been repaired/replaced. Aortic valve  regurgitation is not visualized. No aortic stenosis is present. There is a  23 mm Sapien  prosthetic (TAVR) valve present in the aortic position.  Procedure Date: 06/21/21.   6. The inferior vena cava is normal in size with greater than 50%  respiratory variability, suggesting right atrial pressure of 3 mmHg.   Comparison(s): No significant change from prior study.   TAVR  06/21/2021: TAVR OPERATIVE NOTE     Date of Procedure:                06/21/2021   Preoperative Diagnosis:      Severe Aortic Stenosis    Postoperative Diagnosis:    Same    Procedure:        Transcatheter Aortic Valve Replacement - Transfemoral Approach             Edwards Sapien 3 Resilia THV (size 23 mm, model # 9755RLS, serial #[id number])              Co-Surgeons:                         Gaye Pollack, MD and Lenna Sciara, MD Anesthesiologist:                     Echocardiographer:              Adele Barthel, MD   Pre-operative Echo Findings: Severe aortic stenosis Normal left ventricular systolic function   Post-operative Echo Findings: Trace paravalvular leak Normal left ventricular systolic function  Cardiac TAVR CT  05/12/2021: IMPRESSION: 1. Trileaflet aortic valve with severe calcifications (AV calcium score 2538)   2. Aortic annulus measures 64m x 162min diameter with perimeter 7027mnd area 371 mm^2. No annular or LVOT calcifications. Annular measurements are suitable for delivery of 33m51mwards Sapien 3 valve   3. Sufficient coronary to annulus distance, measuring 17mm33mleft main and 16mm 28mCA   4. Optimum Fluoroscopic Angle for Delivery:  LAO 24 CAU 1  Right/Left Heart Cath  05/04/2021:   Prox LAD lesion is 40% stenosed.   Ost RCA to Prox RCA lesion is 30%  stenosed.   Ost 3rd Mrg lesion is 30% stenosed.   Ost 2nd Diag lesion is 70% stenosed.   Non-stenotic Mid LAD lesion was previously treated.   1.  Normal filling pressures, cardiac output, cardiac index 2.  Patent distal LAD stent with one-vessel ostial second diagonal disease with mild to moderate disease elsewhere; medical therapy will be pursued.  Diagnostic: Dominance: Right    Echo 03/24/21  1. Left ventricular ejection fraction, by estimation, is 60 to 65%. The left ventricle has normal function. The left ventricle has no regional wall motion abnormalities. There is mild concentric left ventricular hypertrophy. Left ventricular diastolic parameters are consistent with Grade I diastolic dysfunction (impaired relaxation). Elevated left ventricular end-diastolic pressure. The average left ventricular global longitudinal strain is -19.8 %. The global longitudinal strain is normal.   2. Right ventricular systolic function is normal. The right ventricular size is normal. There is normal pulmonary artery systolic pressure.   3. The mitral valve is normal in structure. Trivial mitral valve regurgitation. No evidence of mitral stenosis. Moderate mitral annular calcification.   4. The aortic valve is calcified. There is severe calcifcation of the aortic valve. There is severe thickening of the aortic valve. Aortic valve regurgitation is trivial. Severe aortic valve stenosis.   5. The inferior vena cava is normal in size with greater than 50% respiratory variability, suggesting right atrial pressure of 3 mmHg.  AV Vmax:           451.00 cm/s  AV Mean Grad:      54.0 mmHg   ECHO 09/07/20:  1. Left ventricular ejection fraction, by estimation, is 65 to 70%. The left ventricle has normal function. The left ventricle has no regional wall motion abnormalities. Left ventricular diastolic parameters are consistent with Grade I diastolic  dysfunction (impaired relaxation). Elevated left ventricular  end-diastolic pressure.   2. Right ventricular systolic function is normal. The right ventricular size is normal.   3. The mitral valve is grossly normal. No evidence of mitral valve regurgitation. No evidence of mitral stenosis. Moderate mitral annular calcification.   4. The aortic valve is tricuspid. There is moderate calcification of the aortic valve. There is moderate thickening of the aortic valve. Aortic valve regurgitation is not visualized. Moderate to severe aortic valve stenosis.   Cardiac Cath 05/09/18 Conclusions: Severe single-vessel CAD with 90% mid/distal LAD stenosis.  There are additional mild to moderate stenoses involving the mid and distal LAD. Mild, non-obstructive OM and RCA disease. Low left ventricular filling pressure. Moderate aortic valve stenosis. Successful PCI to 90% mid/distal LAD stenosis using Orsiro 2.5 x 13 mm DES with 0% residual stenosis and TIMI-3 flow.  CT Head 05/08/18 1. No acute intracranial abnormality identified. 2. Mild chronic microvascular ischemic changes and mild volume loss of the brain. Small chronic lacunar infarct in right thalamus.  CT Coronary FFR 05/03/18 1. CT FFR analysis showed significant stenosis in the mid LAD. A cardiac catheterization is recommended.  CT Coronary 05/03/18 1. Coronary calcium score of 257. This was 5 percentile for age and sex matched control. 2. Normal coronary origin with right dominance. 3. The study is affected by motion. There is a diffuse CAD with 50-69% stenosis in the ostial RCA, 50-69% stenosis in the mid LAD and in a proximal portion of a small 1. diagonal artery. Additional analysis with CT FFR will be submitted. 4. Severe mitral annular calcifications  EKG: EKG was personally reviewed. 12/01/2021:  EKG was not ordered. 04/06/21: Sinus rhythm, rate 60 bpm; Non-specific ST-T wave changes  Recent Labs: 06/20/2021: B Natriuretic Peptide 109.1 06/23/2021: Hemoglobin 12.4; Platelets 170 12/01/2021:  ALT 38; BUN 18; Creatinine, Ser 0.66; Potassium 5.0; Sodium 138; TSH 0.40   Recent Lipid Panel    Component Value Date/Time   CHOL 190 05/26/2021 0921   CHOL 188 04/22/2019 1521   TRIG 125.0 05/26/2021 0921   HDL 46.90 05/26/2021 0921   HDL 44 04/22/2019 1521   CHOLHDL 4 05/26/2021 0921   VLDL 25.0 05/26/2021 0921   LDLCALC 118 (H) 05/26/2021 0921   LDLCALC 115 (H) 04/22/2019 1521   LDLDIRECT 209 (H) 03/19/2018 1643   LDLDIRECT 170.0 12/11/2016 0943     Risk Assessment/Calculations:      Physical Exam:    VS:  BP 104/64 (BP Location: Left Arm, Patient Position: Sitting, Cuff Size: Normal)   Pulse 65   Ht 5' 3.5" (1.613 m)   Wt 158 lb (71.7 kg)   SpO2 95%   BMI 27.55 kg/m     Wt Readings from Last 3 Encounters:  12/01/21 158 lb (71.7 kg)  07/25/21 158 lb (71.7 kg)  07/01/21 149 lb (67.6 kg)     GEN:  Well nourished, well developed in no acute distress HEENT: Normal NECK: No JVD; No carotid bruits LYMPHATICS: No lymphadenopathy CARDIAC: RRR, 1/6 SM, no rubs, gallops  RESPIRATORY:  Clear to auscultation without rales, wheezing or rhonchi  ABDOMEN: Soft, non-tender, non-distended MUSCULOSKELETAL:  No edema; No deformity  SKIN: Warm and dry NEUROLOGIC:  Alert and oriented x 3 PSYCHIATRIC:  Normal affect   ASSESSMENT:    1. S/P TAVR (transcatheter aortic valve replacement)   2. Coronary artery disease involving native coronary artery of native heart without angina pectoris   3. Diplopia   4. Essential hypertension   5. Hyperlipidemia LDL goal <70      PLAN:    S/P TAVR (transcatheter aortic valve replacement) s/p successful TAVR with a 23 mm Edwards Sapien 3SU THV via the TF approach on 06/21/21. Post operative echo with well seated bioprosthetic aortic valve with a mean gradient measuring 75mHg, no mention of PVL on echo reading. Continue ASA monotherapy. SBE discussed   Coronary artery disease involving native coronary artery of native heart without  angina pectoris CAD s/p DES to mLAD/dLAD (2019): Denies anginal symptoms. Continue ASA, statin.   Diplopia Hx of thalamic CVA 2017: Reported new diplopia, unresolved since TAVR 06/21/21 with associated dizziness with ambulation. No other neuro changes however concerning given hx of prior CVA from 2017. Head CT 06/22/21 with no acute findings. Seen by neurology 06/22/21 with brain MRI w/o contrast showing no acute intracranial process with no etiology seen for patients diplopia.  Overall she has improved.  Excellent.  Essential hypertension We will go ahead and decrease her amlodipine to 2.5 mg a day.  Blood pressure is excellent today 104/64.  She does feel some lower extremity edema at times ankle edema that is.  Hyperlipidemia LDL goal <70 I will go ahead and send her to the lipid clinic to discuss other agents.  She is on low-dose Crestor 2.5 mg a day.    Medication Adjustments/Labs and Tests Ordered: Current medicines are reviewed at length with the patient today.  Concerns regarding medicines are outlined above.   Orders Placed This Encounter  Procedures   AMB Referral to [facility] Pharm-D   Meds ordered this encounter  Medications   amLODipine (NORVASC) 2.5 MG tablet    Sig: Take 1 tablet (2.5 mg total) by mouth daily.    Dispense:  90 tablet    Refill:  3   Patient Instructions  Medication Instructions:  Please decrease your Amlodipine to 2.5 mg a day.  *If you need a refill on your cardiac medications before your next appointment, please call your pharmacy*  [facility].  Follow-Up: [facility] our [facility].  As part of our continuing mission to provide you with exceptional heart care, we have created designated Provider Care Teams.  These Care Teams include your primary Cardiologist (physician) and Advanced Practice Providers (APPs -  Physician Assistants and Nurse Practitioners) who all  work together to provide you with the care you need, when you need it.  We recommend signing up for the patient portal called "MyChart".  Sign up information is provided on this After Visit Summary.  MyChart is used to connect with patients for Virtual Visits (Telemedicine).  Patients are able to view lab/test results, encounter notes, upcoming appointments, etc.  Non-urgent messages can be sent to your provider as well.   To learn more about what you can do with MyChart, go to [email or web address]    Your next appointment:   [facility]  Important Information About Sugar         I,Mathew Stumpf,acting as a scribe  for Candee Furbish, MD.,have documented all relevant documentation on the behalf of Candee Furbish, MD,as directed by  Candee Furbish, MD while in the presence of Candee Furbish, MD.  I, Candee Furbish, MD, have reviewed all documentation for this visit. The documentation on 12/01/21 for the exam, diagnosis, procedures, and orders are all accurate and complete.   Signed, Candee Furbish, MD  12/01/2021 4:10 PM    [facility]

## 2021-12-01 NOTE — Assessment & Plan Note (Signed)
We will go ahead and decrease her amlodipine to 2.5 mg a day.  Blood pressure is excellent today 104/64.  She does feel some lower extremity edema at times ankle edema that is.

## 2021-12-01 NOTE — Assessment & Plan Note (Signed)
Hx of thalamic CVA 2017:Reportednew diplopia, unresolved since TAVR1/31/23with associateddizziness with ambulation. No other neuro changes however concerning given hx of prior CVA from 2017.Head CT 06/22/21 with no acute findings. Seen by neurology 06/22/21 with brain MRI w/o contrast showing no acute intracranial process with no etiology seen for patients diplopia.  Overall she has improved.  Excellent.

## 2021-12-01 NOTE — Assessment & Plan Note (Signed)
CAD s/p DES to mLAD/dLAD (2019):Denies anginal symptoms. Continue ASA, statin.

## 2021-12-08 ENCOUNTER — Ambulatory Visit (INDEPENDENT_AMBULATORY_CARE_PROVIDER_SITE_OTHER): Payer: Medicare Other | Admitting: Endocrinology

## 2021-12-08 ENCOUNTER — Encounter: Payer: Self-pay | Admitting: Endocrinology

## 2021-12-08 VITALS — BP 120/74 | HR 74 | Ht 63.5 in | Wt 160.0 lb

## 2021-12-08 DIAGNOSIS — I2510 Atherosclerotic heart disease of native coronary artery without angina pectoris: Secondary | ICD-10-CM

## 2021-12-08 DIAGNOSIS — E1165 Type 2 diabetes mellitus with hyperglycemia: Secondary | ICD-10-CM | POA: Diagnosis not present

## 2021-12-08 DIAGNOSIS — E063 Autoimmune thyroiditis: Secondary | ICD-10-CM

## 2021-12-08 DIAGNOSIS — E559 Vitamin D deficiency, unspecified: Secondary | ICD-10-CM

## 2021-12-08 DIAGNOSIS — E782 Mixed hyperlipidemia: Secondary | ICD-10-CM

## 2021-12-08 MED ORDER — LEVOTHYROXINE SODIUM 112 MCG PO TABS: 112.0000 ug | ORAL_TABLET | Freq: Every day | ORAL | 3 refills | Status: DC

## 2021-12-08 NOTE — Progress Notes (Signed)
Patient ID: Joanna [name], female   DOB: 1950-08-22, 71 y.o.   MRN: [id number]           Reason for Appointment: Follow-up of endocrinology problems    History of Present Illness:          Diagnosis: Type 2 diabetes mellitus, date of diagnosis: 2010?       Prior history:  On her initial consultation she was advised to start Tanzeum in addition to metformin for multiple benefits including long-term control; was started in 12/15.  Recent history:  A1c is 6.6, previously 6.2   Non-insulin hypoglycemic drugs the patient is taking are: Metformin 1 g 2x  a day, Trulicity 1.5 mg weekly, Amaryl 1 mg at bedtime.     Glucose patterns and problems identified: She has a slightly higher A1c but this is likely to be from her being inconsistent with her diet until recently She said she will sometimes eat more sweets which would cause her blood sugars to be as high as 210 after eating However recently she has tried to improve her diet and reportedly blood sugars are better She is again stating that she is not using a brand-name glucose monitor, also does not bring her meter for review at any visit  She is able to limit her portions with continuing Trulicity which she takes regularly Recently lab glucose is 112 and she thinks her fasting readings are improving also No hypoglycemia by history although she thinks that once her sugar was 40 without symptoms     Side effects from medications have been: none    Glucose monitoring:  done  twice a day         Glucometer:  [employer] brand  Blood Glucose readings  from recall  Am 130; PC 140-210,  ac dinner 80-100   Self-care: The diet that the patient has been following is: tries to limit fat intake.      Meals: 3 meals per day. Breakfast is eggs, , sometimes cottage cheese   Lunch is a sandwich or soup.  Snacks: Cottage cheese and fruit, no sweet drinks juices           Dietician visit, most recent: never      CDE visit: 1/16            Weight  history:    Wt Readings from Last 3 Encounters:  12/08/21 160 lb (72.6 kg)  12/01/21 158 lb (71.7 kg)  07/25/21 158 lb (71.7 kg)    Glycemic control:   Lab Results  Component Value Date   HGBA1C 6.6 (H) 12/01/2021   HGBA1C 6.2 05/26/2021   HGBA1C 6.0 02/10/2021   Lab Results  Component Value Date   MICROALBUR 4.4 (H) 12/01/2021   LDLCALC 118 (H) 05/26/2021   CREATININE 0.66 12/01/2021   Lab Results  Component Value Date   FRUCTOSAMINE 262 08/12/2019   FRUCTOSAMINE 247 06/02/2016   FRUCTOSAMINE 274 (H) 07/16/2014    Past diabetes history:  For several years prior to her diagnosis she had been complaining of her feet burning Her blood sugars were mildly increased initially at diagnosis and she did try to control it with diet alone and exercise without getting them back to normal.  Her A1c had continue to be around 7% with the lowest one 6.7  About 2012 she was started on metformin probably when her A1c was 7.3.   In 2015 her blood sugars had been higher with A1c 8.1%  HYPERLIPIDEMIA: Discussed in review  of systems    Allergies as of 12/08/2021       Reactions   Crestor [rosuvastatin]    REACTION: N/V and heartburn, can very small dose    Lipitor [atorvastatin]    REACTION: Hot flashes and flu-like symptoms   Pravastatin Sodium    REACTION: Neck swelling and pain in her shoulders and arms.   Sulfonamide Derivatives    Rxn Unknown   Zetia [ezetimibe]    GI upset   Zocor [simvastatin] Other (See Comments)   GI issues.. Pt unknown of severity   Niacin Nausea And Vomiting        Medication List        Accurate as of December 08, 2021  3:03 PM. If you have any questions, ask your nurse or doctor.          amLODipine 2.5 MG tablet Commonly known as: NORVASC Take 1 tablet (2.5 mg total) by mouth daily.   amoxicillin 500 MG tablet Commonly known as: AMOXIL Take 4 tablets (2,000 mg total) by mouth as directed. 1 HOUR PRIOR TO DENTAL APPOINTMENTS   aspirin  EC 81 MG tablet Take 1 tablet (81 mg total) by mouth daily.   carvedilol 6.25 MG tablet Commonly known as: COREG TAKE 1 TABLET BY MOUTH TWICE A DAY   glimepiride 1 MG tablet Commonly known as: AMARYL TAKE 1 TABLET BY MOUTH EVERYDAY AT BEDTIME   glucose blood test strip Commonly known as: ONE TOUCH ULTRA TEST USE TO CHECK BLOOD SUGAR TWICE DAILY  Dx:E11.65   levothyroxine 125 MCG tablet Commonly known as: Synthroid 1 tab daily except 1/2 on Sundays   Magnesium 500 MG Tabs Take 500 mg by mouth 2 (two) times daily.   metFORMIN 1000 MG tablet Commonly known as: GLUCOPHAGE TAKE 1 TABLET BY MOUTH TWICE A DAY   nitroGLYCERIN 0.4 MG SL tablet Commonly known as: NITROSTAT Place 1 tablet (0.4 mg total) under the tongue every 5 (five) minutes x 3 doses as needed for chest pain.   OneTouch Delica Lancets 40J Misc by Does not apply route. Use to test blood sugars once daily   OneTouch Verio w/Device Kit by Does not apply route. Use to check blood sugars once daily.   ramipril 10 MG capsule Commonly known as: ALTACE TAKE 1 CAPSULE BY MOUTH EVERY DAY   rosuvastatin 5 MG tablet Commonly known as: CRESTOR TAKE 1/2 TABLET BY MOUTH EVERY DAY   Trulicity 1.5 WJ/1.9JY Sopn Generic drug: Dulaglutide Inject 1.5 mg into the skin once a week.        Allergies:  Allergies  Allergen Reactions   Crestor [Rosuvastatin]     REACTION: N/V and heartburn, can very small dose    Lipitor [Atorvastatin]     REACTION: Hot flashes and flu-like symptoms   Pravastatin Sodium     REACTION: Neck swelling and pain in her shoulders and arms.   Sulfonamide Derivatives     Rxn Unknown   Zetia [Ezetimibe]     GI upset   Zocor [Simvastatin] Other (See Comments)    GI issues.. Pt unknown of severity   Niacin Nausea And Vomiting    Past Medical History:  Diagnosis Date   Abnormal liver function tests    Adenomatous colon polyp    CAD (coronary artery disease) 05/09/2018   a. LHC 05/09/18:  DES to mid/dist LAD, mod AS   Cerebrovascular disease    a. CT 04/2016 -calcific plaque at the origin LEFT vertebral contributing to severe stenosis.  Diverticulitis of colon    Fatty liver    a. mild fatty liver infiltration by CT 2014.   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 TAli Loweand Dr. BCyndia Bent  Statin intolerance    Stroke ([facility]    a. right thalamic infarct in 04/2016 felt by neuro to be due to small vessel disease.   [place] II diabetes [place]     Past Surgical History:  Procedure Laterality Date   CARDIAC CATHETERIZATION     CATARACT EXTRACTION W/ INTRAOCULAR LENS  IMPLANT, BILATERAL Bilateral 08/2016   COLONOSCOPY W/ BIOPSIES AND POLYPECTOMY  05/19/2005   adenomatous polyps   CORONARY STENT INTERVENTION N/A 05/09/2018   Procedure: CORONARY STENT INTERVENTION;  Surgeon: ENelva Bush MD;  [facility];  Service: Cardiovascular;  Laterality: N/A;   INTRAOPERATIVE TRANSTHORACIC ECHOCARDIOGRAM N/A 06/21/2021   Procedure: INTRAOPERATIVE TRANSTHORACIC ECHOCARDIOGRAM;  Surgeon: TEarly Osmond MD;  [facility]  Service: Open Heart Surgery;  Laterality: N/A;   LEFT HEART CATH AND CORONARY ANGIOGRAPHY N/A 05/09/2018   Procedure: LEFT HEART CATH AND CORONARY ANGIOGRAPHY;  Surgeon: ENelva Bush MD;  [facility];  Service: Cardiovascular;  Laterality: N/A;   MELANOMA EXCISION  1980s   mid upper stomach   NEPHROLITHOTOMY Left 01/27/2013   Procedure: NEPHROLITHOTOMY PERCUTANEOUS;  Surgeon: LDutch Gray MD;  Location: [facility];  Service: Urology;  Laterality: Left;   RIGHT/LEFT HEART CATH AND  CORONARY ANGIOGRAPHY N/A 05/04/2021   Procedure: RIGHT/LEFT HEART CATH AND CORONARY ANGIOGRAPHY;  Surgeon: TEarly Osmond MD;  [facility];  Service: Cardiovascular;  Laterality: N/A;   TRANSCATHETER AORTIC VALVE REPLACEMENT, TRANSFEMORAL N/A 06/21/2021   Procedure: TRANSCATHETER AORTIC VALVE REPLACEMENT, TRANSFEMORAL USING A 23 MM EDWARDS SAPIEN 3 ULTRA  AORTIC VALVE;  Surgeon: TEarly Osmond MD;  [facility]  Service: Open Heart Surgery;  Laterality: N/A;   TUBAL LIGATION     WISDOM TEETH EXTRACTED      Family History  Problem Relation Age of Onset   Hyperlipidemia Mother    Neurofibromatosis Father    Thyroid disease Daughter    Hyperlipidemia Sister    Hyperlipidemia Brother    Diabetes Neg Hx    Stroke Neg Hx    Colon cancer Neg Hx    Esophageal cancer Neg Hx    Pancreatic cancer Neg Hx    Liver cancer Neg Hx     Social History:  reports that she has never smoked. She has never used smokeless tobacco. She reports that she does not drink alcohol and does not use drugs.     Review of Systems        LIPIDS: she has long-standing severe hypercholesterolemia and has been intolerant to all statin drugs with various reactions Lipitor caused flulike symptoms, abdominal bloating and hot flashes, pravastatin caused neck swelling and upper body pain, Zocor caused GI upset.  Was not able to tolerate Zetia as she thinks it upset his stomach She did not want to continue Repatha because of cost  LDL particle number previously over 2500 with ideal levels of below 1000  She is followed by cardiology and has been told to take 1/2 tablet of Crestor q 3 days  She is able to continue this but she thinks that she has GI side effects with taking it every day and takes it at bedtime  LDL is just over 100; due for a follow-up with lipid clinic  Also taking small doses of OTC fish oil      Lab Results  Component Value Date   CHOL 190 05/26/2021   CHOL 172  02/10/2021   CHOL 188 04/22/2019   Lab Results  Component Value Date   HDL 46.90 05/26/2021   HDL 42.10 02/10/2021   HDL 44 04/22/2019   Lab Results  Component Value Date   LDLCALC 118 (H) 05/26/2021   LDLCALC 107 (H) 02/10/2021   LDLCALC 115 (H) 04/22/2019   Lab Results  Component Value Date   TRIG 125.0 05/26/2021   TRIG 115.0 02/10/2021   TRIG 166 (H) 04/22/2019   Lab Results  Component Value Date   CHOLHDL 4 05/26/2021   CHOLHDL 4 02/10/2021   CHOLHDL 4.3 04/22/2019   Lab Results  Component Value Date   LDLDIRECT 209 (H) 03/19/2018   LDLDIRECT 170.0 12/11/2016   LDLDIRECT 178.0 08/22/2016                Thyroid:    She has had hypothyroidism for about 10 years and initially had a goiter also; highest TSH probably about 9.2 She prefers to take Brand name Synthroid and she thinks that levothyroxine does not work  She had been taking 100 mcg of Synthroid prior to her visit in 1/21 and this was lower than the recommended dose With this she was having more fatigue, sleepiness, low energy and cold intolerance along with weight gain  She is on generic levothyroxine and is supposed to be taking 6-1/2 tablets a week but again will forget to skip half a tablet and is taking 1 a day  Her TSH is again in the low normal range Free T4 is slightly higher  Thyroid functions as below:  Lab Results  Component Value Date   TSH 0.40 12/01/2021   TSH 0.38 05/26/2021   TSH 0.24 (L) 02/10/2021   FREET4 1.16 12/01/2021   FREET4 1.05 05/26/2021   FREET4 0.95 02/10/2021   Lab Results  Component Value Date   TSH 0.40 12/01/2021   TSH 0.38 05/26/2021   TSH 0.24 (L) 02/10/2021   TSH 1.43 07/07/2020   TSH 2.52 12/29/2019   TSH 1.54 08/12/2019        The blood pressure is being treated with 5 mg amlodipine and 10 mg ramipril along with metoprolol This is also followed by cardiologist She tends to have high reading on the right arm    BP Readings from Last 3 Encounters:   12/08/21 120/74  12/01/21 104/64  07/25/21 122/68    FATTY liver: Has had high liver function levels for the last few years  CT scan of abdomen in 2014 showed fatty infiltration of the liver  Lab Results  Component Value Date   ALT 38 (H) 12/01/2021     LABS:  No visits with results within 1 Week(s) from this visit.  Latest known visit with results is:  Lab on 12/01/2021  Component Date Value Ref Range Status   VITD 12/01/2021 57.30  30.00 - 100.00 ng/mL Final   Microalb, Ur 12/01/2021 4.4 (H)  0.0 - 1.9 mg/dL Final   Creatinine,U 12/01/2021 65.2  mg/dL Final   Microalb Creat Ratio 12/01/2021 6.7  0.0 - 30.0 mg/g Final   Free T4 12/01/2021 1.16  0.60 - 1.60 ng/dL Final   Comment: Specimens from patients who are undergoing biotin therapy and /or ingesting biotin supplements may contain high levels of biotin.  The higher biotin concentration in these specimens interferes with this Free T4 assay.  Specimens that contain high levels  of biotin may cause false high results for this Free T4 assay.  Please interpret results in light of the total clinical presentation of the patient.     TSH 12/01/2021 0.40  0.35 - 5.50 uIU/mL Final   Sodium 12/01/2021 138  135 - 145 mEq/L Final   Potassium 12/01/2021 5.0  3.5 - 5.1 mEq/L Final   Chloride 12/01/2021 102  96 - 112 mEq/L Final   CO2 12/01/2021 29  19 - 32 mEq/L Final   Glucose, Bld 12/01/2021 112 (H)  70 - 99 mg/dL Final   BUN 12/01/2021 18  6 - 23 mg/dL Final   Creatinine, Ser 12/01/2021 0.66  0.40 - 1.20 mg/dL Final   Total Bilirubin 12/01/2021 0.9  0.2 - 1.2 mg/dL Final   Alkaline Phosphatase 12/01/2021 72  39 - 117 U/L Final   AST 12/01/2021 25  0 - 37 U/L Final   ALT 12/01/2021 38 (H)  0 - 35 U/L Final   Total Protein 12/01/2021 8.2  6.0 - 8.3 g/dL Final   Albumin 12/01/2021 4.9  3.5 - 5.2 g/dL Final   GFR 12/01/2021 89.65  >60.00 mL/min Final   Calculated using the CKD-EPI Creatinine Equation (2021)   Calcium 12/01/2021  10.1  8.4 - 10.5 mg/dL Final   Hgb A1c MFr Bld 12/01/2021 6.6 (H)  4.6 - 6.5 % Final   Glycemic Control Guidelines for People with Diabetes:Non Diabetic:  <6%Goal of Therapy: <7%Additional Action Suggested:  >8%     Physical Examination:  BP 120/74 (BP Location: Left Arm, Patient Position: Sitting, Cuff Size: Normal)   Pulse 74   Ht 5' 3.5" (1.613 m)   Wt 160 lb (72.6 kg)   SpO2 97%   BMI 27.90 kg/m            ASSESSMENT/PLAN:   Diabetes type 2    See history of present illness for details of her current blood sugar patterns and management  Her A1c is slightly higher at 6.6, previously at 6.2  She is taking metformin, Amaryl and Trulicity 1.5 mg  Overall control is still fairly good and recently improving with her getting back on her diet Also walking regularly Discussed potential for hypoglycemia with Amaryl and if she has unusually low readings below 80 she will let us know She needs to bring a record of her blood sugars or her meter for review  No microalbuminuria  HYPOTHYROIDISM: Her TSH is low normal with 125 mcg of levothyroxine She will now take 112 mcg daily as she forgets to take the half tablet weekly  LIPIDS: Currently followed by cardiology  Refuses to consider any injectable medication She will discuss this further with the lipid clinic  History of vitamin D deficiency: Her level is quite normal now, her dose was reduced from her levels for high on the last visit  Follow-up in 6 months at her request  There are no Patient Instructions on file for this visit.    Elayne Snare 12/08/2021, 3:03 PM   Note: This  office note was prepared with Estate agent. Any transcriptional errors that result from this process are unintentional.

## 2021-12-19 ENCOUNTER — Ambulatory Visit: Payer: Medicare Other

## 2021-12-23 ENCOUNTER — Other Ambulatory Visit: Payer: Self-pay | Admitting: Endocrinology

## 2022-01-14 ENCOUNTER — Other Ambulatory Visit: Payer: Self-pay | Admitting: Endocrinology

## 2022-02-07 ENCOUNTER — Ambulatory Visit: Payer: Medicare Other | Attending: Interventional Cardiology | Admitting: Pharmacist

## 2022-02-07 DIAGNOSIS — E785 Hyperlipidemia, unspecified: Secondary | ICD-10-CM

## 2022-02-07 DIAGNOSIS — I2510 Atherosclerotic heart disease of native coronary artery without angina pectoris: Secondary | ICD-10-CM | POA: Diagnosis not present

## 2022-02-07 NOTE — Patient Instructions (Signed)
We will ask Dr. Dwyane Dee if he will check your cholesterol in Jan. Continue rosuvastatin 2.'5mg'$  every other day Continue with your exercise. Increase as able.

## 2022-02-07 NOTE — Progress Notes (Unsigned)
Patient ID: Joanna [name]                 DOB: 06/17/1950                    MRN: [id number]      HPI: Joanna [name] is a 71 y.o. female patient referred to lipid clinic by Dr. Marlou Porch. PMH is significant for aortic stenosis s/p TAVR (06/21/2021), hypertension, and hyperlipidemia, DM, coronary artery disease, prior LAD stent (2019) with moderate to severe aortic stenosis and prior stroke in 2018 with left arm periodic weakness.  Patient only able to take a low dose of rosuvastatin. She was referred to lipid clinic to talk about alternatives.  Patient presents today to clinic. She is upset that she does not have any recent labs done. She also states off the bat that she will not take "the shot" or increase her medication. I advised patient that I am not here to convince her of anything. I am only here to give her the information so she can make the decision she feels is best for her.  She has increased rosuvastatin from 1.80m every other day to 2.559mevery other day. She states she took atorvastatin and it "about killed her." States it caused her to gain 30lb, she was burning up and it gave her diabetes. She states that when she stopped taking it her hot flashes went away and she lost weight.  She recently has increased her activity. Is walking daily, riding a bike every 3 days and playing pickleball.   Current Medications: rosuvastatin 2.33m70mvery other day Intolerances: atorvastatin (weight gain, sweating, hot flashes, DM) Risk Factors: DM, stoke, CAD LDL goal: <55  Diet:  Optivia: Shake and scrambled egg Lunch: avocado  Exercise: pickleball, walking 20-30 min daily, riding bike every 3 days  Family History: The patient's family history includes Hyperlipidemia in her brother, mother, and sister; Neurofibromatosis in her father; Thyroid disease in her daughter. There is no history of Diabetes, Stroke, Colon cancer, Esophageal cancer, Pancreatic cancer, or Liver cancer.  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 One caffeinated drink daily.Updated as of 04/30/2013   Social Determinants of Health   Financial Resource Strain: Low Risk  (07/11/2018)   Overall Financial Resource Strain (CARDIA)    Difficulty of Paying Living Expenses: Not hard at all  Food Insecurity: No Food Insecurity (07/11/2018)   Hunger Vital Sign    Worried About Running Out of Food in the Last Year: Never true    Ran Out of Food in the Last Year: Never true  Transportation Needs: No Transportation Needs (07/11/2018)   PRAPARE - TraHydrologistedical): No    Lack of Transportation (Non-Medical): No  Physical Activity: Sufficiently Active (07/11/2018)   Exercise Vital Sign    Days of Exercise per Week: 5 days    Minutes of Exercise per Session: 30 min  Stress: No Stress Concern Present (07/11/2018)   [place]    Feeling of Stress : Not at all  Social Connections: Not on file  Intimate Partner Violence: Not on file     Labs: 02/10/21 TC 172, TG 115, HDL 42, LDL 107   Past Medical History:  Diagnosis Date   Abnormal liver function tests    Adenomatous colon polyp    CAD (coronary artery disease) 05/09/2018   a. LHC 05/09/18: DES to mid/dist LAD, mod AS   Cerebrovascular disease    a. CT 04/2016 -calcific  plaque at the origin LEFT vertebral contributing to severe stenosis.   Diverticulitis of colon    Fatty liver    a. mild fatty liver infiltration by CT 2014.   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 TAli Loweand Dr. BCyndia Bent  Statin intolerance    [facility]    a. right thalamic infarct in 04/2016 felt by neuro to be due to small vessel disease.   [place]     Current Outpatient Medications on File Prior to Visit  Medication Sig Dispense Refill   amLODipine (NORVASC) 2.5 MG tablet Take 1 tablet (2.5 mg total) by mouth daily. 90 tablet 3   amoxicillin (AMOXIL) 500 MG tablet Take 4 tablets (2,000 mg total) by mouth as directed. 1 HOUR PRIOR TO DENTAL APPOINTMENTS 12 tablet 16   aspirin EC 81 MG tablet Take 1 tablet (81 mg total) by mouth daily. 30 tablet 0   Blood Glucose Monitoring Suppl (ONETOUCH VERIO) w/Device KIT by Does not apply route. Use to check blood sugars once daily.     carvedilol (COREG) 6.25 MG tablet TAKE 1 TABLET BY MOUTH TWICE A DAY 180 tablet 3   glimepiride (AMARYL) 1 MG tablet TAKE 1 TABLET BY MOUTH EVERYDAY AT BEDTIME 90 tablet 1   glucose blood (ONE TOUCH ULTRA TEST) test strip USE TO CHECK BLOOD SUGAR TWICE DAILY  Dx:E11.65 100 each 3   levothyroxine (SYNTHROID) 112 MCG tablet Take 1 tablet (112 mcg total) by mouth daily. 90 tablet 3   levothyroxine (SYNTHROID) 125 MCG tablet TAKE 1 TABLET BY MOUTH DAILY EXCEPT 1/2 TABLET ON SUNDAYS 84 tablet 2   Magnesium 500 MG TABS Take 500 mg by mouth 2 (two) times daily.     metFORMIN (GLUCOPHAGE) 1000 MG tablet TAKE 1 TABLET BY MOUTH TWICE A DAY 180 tablet 1    nitroGLYCERIN (NITROSTAT) 0.4 MG SL tablet Place 1 tablet (0.4 mg total) under the tongue every 5 (five) minutes x 3 doses as needed for chest pain. 25 tablet 12   OneTouch Delica Lancets 367EMISC by Does not apply route. Use to test blood sugars once daily     ramipril (ALTACE) 10 MG capsule TAKE 1 CAPSULE BY MOUTH EVERY DAY 90 capsule 3  rosuvastatin (CRESTOR) 5 MG tablet TAKE 1/2 TABLET BY MOUTH EVERY DAY 45 tablet 3   TRULICITY 1.5 FR/1.0YT SOPN Inject 1.5 mg into the skin once a week.     No current facility-administered medications on file prior to visit.    Allergies  Allergen Reactions   Crestor [Rosuvastatin]     REACTION: N/V and heartburn, can very small dose    Lipitor [Atorvastatin]     REACTION: Hot flashes and flu-like symptoms   Pravastatin Sodium     REACTION: Neck swelling and pain in her shoulders and arms.   Sulfonamide Derivatives     Rxn Unknown   Zetia [Ezetimibe]     GI upset   Zocor [Simvastatin] Other (See Comments)    GI issues.. Pt unknown of severity   Niacin Nausea And Vomiting    Assessment/Plan:  1. Hyperlipidemia - LDL-C is above goal of <55. Patient refused PCSK9i. States Dr. Dwyane Dee already tried to convince her. We did discuss Nexletol, zetia, Nexlizet and Leqvio. Patient would like to continue with her diet and exercise and recently increased dose of rosuvastatin. She would like to get her cholesterol re-checked with Dr. Dwyane Dee when he draws her other labs in Jan. She requests that we ask him to draw. I will sent Dr. Dwyane Dee a message closer to that time. Will re discuss after lab come back.    Thank you,   Ramond Dial, Pharm.D, BCPS, CPP [facility] [address] [place], [place] [address]  Phone: [phone]; Fax: [phone]

## 2022-03-04 ENCOUNTER — Other Ambulatory Visit: Payer: Self-pay | Admitting: Endocrinology

## 2022-03-20 ENCOUNTER — Other Ambulatory Visit: Payer: Self-pay | Admitting: Physician Assistant

## 2022-03-20 DIAGNOSIS — Z952 Presence of prosthetic heart valve: Secondary | ICD-10-CM

## 2022-04-10 ENCOUNTER — Other Ambulatory Visit: Payer: Self-pay | Admitting: Endocrinology

## 2022-04-10 ENCOUNTER — Other Ambulatory Visit: Payer: Self-pay | Admitting: Cardiology

## 2022-05-30 ENCOUNTER — Other Ambulatory Visit: Payer: Self-pay | Admitting: Endocrinology

## 2022-05-31 ENCOUNTER — Ambulatory Visit: Payer: Medicare Other

## 2022-05-31 ENCOUNTER — Other Ambulatory Visit (HOSPITAL_COMMUNITY): Payer: Medicare Other

## 2022-06-02 ENCOUNTER — Ambulatory Visit: Payer: Medicare Other | Attending: Cardiology

## 2022-06-02 ENCOUNTER — Ambulatory Visit (INDEPENDENT_AMBULATORY_CARE_PROVIDER_SITE_OTHER): Payer: Medicare Other | Admitting: Physician Assistant

## 2022-06-02 ENCOUNTER — Other Ambulatory Visit: Payer: Self-pay | Admitting: Physician Assistant

## 2022-06-02 VITALS — BP 104/70 | HR 65 | Ht 63.5 in | Wt 161.0 lb

## 2022-06-02 DIAGNOSIS — Z952 Presence of prosthetic heart valve: Secondary | ICD-10-CM | POA: Diagnosis not present

## 2022-06-02 DIAGNOSIS — E785 Hyperlipidemia, unspecified: Secondary | ICD-10-CM | POA: Diagnosis not present

## 2022-06-02 DIAGNOSIS — I2510 Atherosclerotic heart disease of native coronary artery without angina pectoris: Secondary | ICD-10-CM | POA: Insufficient documentation

## 2022-06-02 DIAGNOSIS — I10 Essential (primary) hypertension: Secondary | ICD-10-CM | POA: Diagnosis not present

## 2022-06-02 DIAGNOSIS — Z8673 Personal history of transient ischemic attack (TIA), and cerebral infarction without residual deficits: Secondary | ICD-10-CM | POA: Diagnosis not present

## 2022-06-02 LAB — ECHOCARDIOGRAM COMPLETE
AR max vel: 0.9 cm2
AV Area VTI: 0.93 cm2
AV Area mean vel: 0.83 cm2
AV Mean grad: 10 mmHg
AV Peak grad: 18.5 mmHg
Ao pk vel: 2.15 m/s
Area-P 1/2: 2.56 cm2
Est EF: >75
S' Lateral: 2.3 cm

## 2022-06-02 NOTE — Progress Notes (Unsigned)
[place]                                     Cardiology Office Note:    Date:  06/05/2022   ID:  Joanna [name], DOB May 16, 1951, MRN [id number]  PCP:  Pcp, No  [facility] Cardiologist:  Candee Furbish, MD  [place] Electrophysiologist:  None   Referring MD: No ref. provider found   1 year s/p TAVR  History of Present Illness:    Joanna [name] is a 72 y.o. female with a hx of OSA on CPAP, HTN, HLD, hypothyroidism, DMT2, previous right thalamic stroke in 04/2016 felt to be due to small vessel disease, CAD s/p DES to mLAD (2019), and severe AS s/p TAVR (06/21/21) who presents to clinic for follow up.  She was referred by Dr. Marlou Porch due to a 15-monthhistory of progressive exertional fatigue and shortness of breath. Echo11/07/2020 showed severe AS with a mean gradient across aortic valve to 54 mmHg with a valve area of 0.58 cm with normal LVEF. LHC 05/04/22 showed normal filling pressures, cardiac output, cardiac index as well as a patent distal LAD stent with one-vessel ostial second diagonal disease with mild to moderate disease elsewhere. Recommendations were to continue with medical therapy.    She was evaluated by the multidisciplinary valve team and underwent  successful TAVR with a 23 mm Edwards Sapien 3SU THV via the TF approach on 06/21/21. Post operative echo with EF 60%, well seated bioprosthetic aortic valve with a mean gradient measuring 142mg and no PVL. She had diplopia after TAVR. Neurology was consulted 06/22/21 and brain CT/MRI showed no acute intracranial process. Plan was to add Plavix 7588mO QD to regimen along with ASA 38m26m QD. OP neurology referral was placed at discharge with appointment set for 4/26. She was discharged on DAPT which was later converted to aspirin monotherapy. 1 month echo showed EF 65%, normally functioning TAVR- no clear continuous wave Doppler through the TAVR valve but there did not appear to be  any significant stenosis.  Today the patient presents to clinic for follow up. No CP or SOB. No LE edema, orthopnea or PND. No dizziness or syncope. No blood in stool or urine. No palpitations. Plays paddle and bikes regularly. Left arm heaviness is the bigest complaint. This has been a residual issue since her stroke that comes and goes. Occasionally has blurry vision when she gets very tired.   Past Medical History:  Diagnosis Date   Abnormal liver function tests    Adenomatous colon polyp    CAD (coronary artery disease) 05/09/2018   a. LHC 05/09/18: DES to mid/dist LAD, mod AS   Cerebrovascular disease    a. CT 04/2016 -calcific plaque at the origin LEFT vertebral contributing to severe stenosis.   Diverticulitis of colon    Fatty liver    a. mild fatty liver infiltration by CT 2014.   Hx of renal calculi    LEFT   Hyperlipidemia    Hypertension    Hypothyroidism    pt states hx of thyroid nodules   IBS (irritable bowel syndrome)    Internal hemorrhoids    [place]anoma (HCC[place]   mid upper stomach (05/08/2018)   OSA on CPAP     SETTING IS 14 (05/08/2018)   S/P TAVR (transcatheter aortic valve replacement) 06/21/2021  Edwards 33m S3UR via TF approach with Dr. TAli Loweand Dr. BCyndia Bent  Statin intolerance    Stroke ([facility]    a. right thalamic infarct in 04/2016 felt by neuro to be due to small vessel disease.   Type II diabetes mellitus ([place]     Past Surgical History:  Procedure Laterality Date   CARDIAC CATHETERIZATION     CATARACT EXTRACTION W/ INTRAOCULAR LENS  IMPLANT, BILATERAL Bilateral 08/2016   COLONOSCOPY W/ BIOPSIES AND POLYPECTOMY  05/19/2005   adenomatous polyps   CORONARY STENT INTERVENTION N/A 05/09/2018   Procedure: CORONARY STENT INTERVENTION;  Surgeon: ENelva Bush MD;  [facility];  Service: Cardiovascular;  Laterality: N/A;   INTRAOPERATIVE TRANSTHORACIC ECHOCARDIOGRAM N/A 06/21/2021   Procedure: INTRAOPERATIVE TRANSTHORACIC  ECHOCARDIOGRAM;  Surgeon: TEarly Osmond MD;  Location: [facility]  Service: Open Heart Surgery;  Laterality: N/A;   LEFT HEART CATH AND CORONARY ANGIOGRAPHY N/A 05/09/2018   Procedure: LEFT HEART CATH AND CORONARY ANGIOGRAPHY;  Surgeon: ENelva Bush MD;  [facility];  Service: Cardiovascular;  Laterality: N/A;   MELANOMA EXCISION  1980s   mid upper stomach   NEPHROLITHOTOMY Left 01/27/2013   Procedure: NEPHROLITHOTOMY PERCUTANEOUS;  Surgeon: LDutch Gray MD;  Location: [facility];  Service: Urology;  Laterality: Left;   RIGHT/LEFT HEART CATH AND CORONARY ANGIOGRAPHY N/A 05/04/2021   Procedure: RIGHT/LEFT HEART CATH AND CORONARY ANGIOGRAPHY;  Surgeon: TEarly Osmond MD;  [facility];  Service: Cardiovascular;  Laterality: N/A;   TRANSCATHETER AORTIC VALVE REPLACEMENT, TRANSFEMORAL N/A 06/21/2021   Procedure: TRANSCATHETER AORTIC VALVE REPLACEMENT, TRANSFEMORAL USING A 23 MM EDWARDS SAPIEN 3 ULTRA  AORTIC VALVE;  Surgeon: TEarly Osmond MD;  Location: [facility]  Service: Open Heart Surgery;  Laterality: N/A;   TUBAL LIGATION     WISDOM TEETH EXTRACTED      Current Medications: Current Meds  Medication Sig   amLODipine (NORVASC) 2.5 MG tablet Take 1 tablet (2.5 mg total) by mouth daily.   amoxicillin (AMOXIL) 500 MG tablet Take 4 tablets (2,000 mg total) by mouth as directed. 1 HOUR PRIOR TO DENTAL APPOINTMENTS   aspirin EC 81 MG tablet Take 1 tablet (81 mg total) by mouth daily.   Blood Glucose Monitoring Suppl (ONETOUCH VERIO) w/Device KIT by Does not apply route. Use to check blood sugars once daily.   carvedilol (COREG) 6.25 MG tablet Take 1 tablet (6.25 mg total) by mouth 2 (two) times daily.   Dulaglutide (TRULICITY) 1.5 MCV/8.9FYSOPN INJECT 1.5 MG INTO THE SKIN ONCE WEEKLY AS DIRECTED   glimepiride (AMARYL) 1 MG tablet TAKE 1 TABLET BY MOUTH EVERYDAY AT BEDTIME   glucose blood (ONE TOUCH ULTRA TEST) test strip USE TO CHECK BLOOD SUGAR TWICE  DAILY  Dx:E11.65   levothyroxine (SYNTHROID) 112 MCG tablet Take 1 tablet (112 mcg total) by mouth daily.   Magnesium 500 MG TABS Take 500 mg by mouth 2 (two) times daily.   metFORMIN (GLUCOPHAGE) 1000 MG tablet TAKE 1 TABLET BY MOUTH TWICE A DAY   nitroGLYCERIN (NITROSTAT) 0.4 MG SL tablet Place 1 tablet (0.4 mg total) under the tongue every 5 (five) minutes x 3 doses as needed for chest pain.   OneTouch Delica Lancets 310FMISC by Does not apply route. Use to test blood sugars once daily   ramipril (ALTACE) 10 MG capsule TAKE 1 CAPSULE BY MOUTH EVERY DAY   rosuvastatin (CRESTOR) 5 MG tablet TAKE 1/2 TABLET BY MOUTH EVERY DAY (Patient  taking differently: Take 5 mg by mouth every other day.)     Allergies:   Crestor [rosuvastatin], Lipitor [atorvastatin], Pravastatin sodium, Sulfonamide derivatives, Zetia [ezetimibe], Zocor [simvastatin], and Niacin   Social History   Socioeconomic History   Marital status: Married    Spouse name: Not on file   Number of children: 3   Years of education: 13   Highest education level: High school graduate  Occupational History   Occupation: Producer, 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 One caffeinated drink daily.Updated as of 04/30/2013   Social Determinants of Health   Financial Resource Strain: Low Risk  (07/11/2018)   Overall Financial Resource Strain (CARDIA)    Difficulty of Paying Living Expenses: Not hard at all  Food Insecurity: No Food Insecurity (07/11/2018)   Hunger Vital Sign    Worried About Running Out of Food in the Last Year: Never true    Ran Out of Food in the Last Year: Never true   Transportation Needs: No Transportation Needs (07/11/2018)   PRAPARE - Hydrologist (Medical): No    Lack of Transportation (Non-Medical): No  Physical Activity: Sufficiently Active (07/11/2018)   Exercise Vital Sign    Days of Exercise per Week: 5 days    Minutes of Exercise per Session: 30 min  Stress: No Stress Concern Present (07/11/2018)   [place]    Feeling of Stress : Not at all  Social Connections: Not on file     Family History: The patient's family history includes Hyperlipidemia in her brother, mother, and sister; Neurofibromatosis in her father; Thyroid disease in her daughter. There is no history of Diabetes, Stroke, Colon cancer, Esophageal cancer, Pancreatic cancer, or Liver cancer.  ROS:   Please see the history of present illness.    All other systems reviewed and are negative.  EKGs/Labs/Other Studies Reviewed:    The following studies were reviewed today:  Echo 06/02/22 MPRESSIONS   1. There is a 23 mm Edwards Sapien prosthetic (TAVR) valve present in the  aortic position. Procedure Date: 06/21/2021.   2. The mitral valve is normal in structure. Trivial mitral valve  regurgitation. No evidence of mitral stenosis. Moderate mitral annular  calcification.   3. Left ventricular ejection fraction, by estimation, is >75%. The left  ventricle has hyperdynamic function. The left ventricle has no regional  wall motion abnormalities. Left ventricular diastolic parameters are  indeterminate.   4. Right ventricular systolic function is normal. The right ventricular  size is normal. Tricuspid regurgitation signal is inadequate for assessing  PA pressure.   5. The inferior vena cava is normal in size with greater than 50%  respiratory variability, suggesting right atrial pressure of 3 mmHg.   Comparison(s): Changes from prior study are noted. AVA is lower on current  study,  but stroke volume is also lower. DI now 0.37, prior 0.52. Mean  gradient is similar.  ______________________   Echocardiogram  10/07/2021:  1. S/P TAVR with mean gradient 11 mmHg,  DI 0.52 and no AI (normally  functioning prosthetic valve).   2. Left ventricular ejection fraction, by estimation, is >75%. The left  ventricle has hyperdynamic function. The left ventricle has no regional  wall motion abnormalities. There is mild left ventricular hypertrophy.  Left ventricular diastolic parameters  are consistent with Grade I diastolic dysfunction (impaired relaxation).  Elevated left atrial pressure.   3. Right ventricular systolic function is normal. The right ventricular  size is normal.   4. The mitral valve is normal in structure. Mild mitral valve  regurgitation. No evidence of mitral stenosis. Moderate mitral annular  calcification.   5. The aortic valve has been repaired/replaced. Aortic valve  regurgitation is not visualized. No aortic stenosis is present. There is a  23 mm Sapien prosthetic (TAVR) valve present in the aortic position.  Procedure Date: 06/21/21.   6. The inferior vena cava is normal in size with greater than 50%  respiratory variability, suggesting right atrial pressure of 3 mmHg.   Comparison(s): No significant change from prior study.      ______________________   TAVR OPERATIVE NOTE     Date of Procedure:                06/21/2021   Preoperative Diagnosis:      Severe Aortic Stenosis    Postoperative Diagnosis:    Same    Procedure:        Transcatheter Aortic Valve Replacement - Transfemoral Approach             Edwards Sapien 3 Resilia THV (size 23 mm, model # 9755RLS, serial #[id number])              Co-Surgeons:                         Gaye Pollack, MD and Lenna Sciara, MD Anesthesiologist:                     Echocardiographer:              Adele Barthel, MD   Pre-operative Echo Findings: Severe aortic stenosis Normal left ventricular  systolic function   Post-operative Echo Findings: Trace paravalvular leak Normal left ventricular systolic function   Cardiac TAVR CT  05/12/2021: IMPRESSION: 1. Trileaflet aortic valve with severe calcifications (AV calcium score 2538)   2. Aortic annulus measures 19m x 191min diameter with perimeter 7063mnd area 371 mm^2. No annular or LVOT calcifications. Annular measurements are suitable for delivery of 35m69mwards Sapien 3 valve   3. Sufficient coronary to annulus distance, measuring 17mm5mleft main and 16mm 25mCA   4. Optimum Fluoroscopic Angle for Delivery:  LAO 24 CAU 1   ______________________  Right/Left Heart Cath  05/04/2021:   Prox LAD lesion is 40% stenosed.   Ost RCA to Prox RCA lesion is 30% stenosed.   Ost 3rd Mrg lesion is 30% stenosed.   Ost 2nd Diag lesion is 70% stenosed.   Non-stenotic Mid LAD lesion was previously treated.   1.  Normal filling pressures, cardiac output, cardiac index 2.  Patent distal LAD stent with one-vessel ostial second diagonal disease with mild to moderate disease elsewhere; medical therapy will be pursued.   Diagnostic: Dominance: Right   EKG:  EKG is NOT ordered today.  Recent Labs: 06/20/2021: B Natriuretic Peptide 109.1 06/23/2021: Hemoglobin 12.4; Platelets 170 12/01/2021: ALT 38; BUN 18; Creatinine, Ser 0.66; Potassium 5.0; Sodium 138;  TSH 0.40  Recent Lipid Panel    Component Value Date/Time   CHOL 190 05/26/2021 0921   CHOL 188 04/22/2019 1521   TRIG 125.0 05/26/2021 0921   HDL 46.90 05/26/2021 0921   HDL 44 04/22/2019 1521   CHOLHDL 4 05/26/2021 0921   VLDL 25.0 05/26/2021 0921   LDLCALC 118 (H) 05/26/2021 0921   LDLCALC 115 (H) 04/22/2019 1521   LDLDIRECT 209 (H) 03/19/2018 1643   LDLDIRECT 170.0 12/11/2016 0943     Risk Assessment/Calculations:       Physical Exam:    VS:  BP 104/70   Pulse 65   Ht 5' 3.5" (1.613 m)   Wt 161 lb (73 kg)   SpO2 96%   BMI 28.07 kg/m     Wt Readings from  Last 3 Encounters:  06/02/22 161 lb (73 kg)  12/08/21 160 lb (72.6 kg)  12/01/21 158 lb (71.7 kg)     GEN:  Well nourished, well developed in no acute distress HEENT: Normal NECK: No JVD LYMPHATICS: No lymphadenopathy CARDIAC: RRR, soft murmur at RUSB. No rubs, gallops RESPIRATORY:  Clear to auscultation without rales, wheezing or rhonchi  ABDOMEN: Soft, non-tender, non-distended MUSCULOSKELETAL:  No edema; No deformity  SKIN: Warm and dry NEUROLOGIC:  Alert and oriented x 3 PSYCHIATRIC:  Normal affect   ASSESSMENT:    1. S/P TAVR (transcatheter aortic valve replacement)   2. Coronary artery disease involving native coronary artery of native heart without angina pectoris   3. History of stroke   4. Essential hypertension   5. Hyperlipidemia LDL goal <70    PLAN:    In order of problems listed above:  Severe AS s/p TAVR: echo today shows EF >75%, normally functioning TAVR with a mean gradient of 10 mm hg and no PVL as well as moderate MAC. She has NYHA class I symptoms. Continue on aspirin alone. SBE prophylaxis discussed; she has amoxicillin. Continue regular follow up with Dr. Marlou Porch.    CAD s/p DES to mLAD/dLAD (2019): no chest pain. Continue ASA, statin.    Hx of thalamic CVA 2017: continue aspirin and stain.    Essential hypertension: Bp well controlled. No changes made.    Hyperlipidemia LDL goal <70: continue low dose Crestor. She has been seen in the lipid clinic and refused PCSK9i therapy. Hasn't had cholesterol checked in a while and was annoyed that she was offered a powerful injectable drug without knowing her cholesterol. She has fasting labs coming up with Dr. Dwyane Dee. I have added a lipid panel with LFTS. Will follow those results.   Medication Adjustments/Labs and Tests Ordered: Current medicines are reviewed at length with the patient today.  Concerns regarding medicines are outlined above.  No orders of the defined types were placed in this encounter.  No  orders of the defined types were placed in this encounter.   Patient Instructions  Medication Instructions:  Your physician recommends that you continue on your current medications as directed. Please refer to the Current Medication list given to you today.   *If you need a refill on your cardiac medications before your next appointment, please call your pharmacy*   Lab Work: None ordered   If you have labs (blood work) drawn today and your tests are completely normal, you will receive your results only by: [place] (if you have MyChart) OR A paper copy in the mail If you have any lab test that is abnormal or we need to change your treatment, we  will call you to review the results.   Testing/Procedures: None ordered    Follow-Up: At [facility], you and your health needs are our priority.  As part of our continuing mission to provide you with exceptional heart care, we have created designated Provider Care Teams.  These Care Teams include your primary Cardiologist (physician) and Advanced Practice Providers (APPs -  Physician Assistants and Nurse Practitioners) who all work together to provide you with the care you need, when you need it.  We recommend signing up for the patient portal called "MyChart".  Sign up information is provided on this After Visit Summary.  MyChart is used to connect with patients for Virtual Visits (Telemedicine).  Patients are able to view lab/test results, encounter notes, upcoming appointments, etc.  Non-urgent messages can be sent to your provider as well.   To learn more about what you can do with MyChart, go to [email or web address].    Your next appointment:   6 month(s)  Provider:   Candee Furbish, MD     Other Instructions     Signed, Angelena Form, PA-C  06/05/2022 9:48 AM    [place]

## 2022-06-02 NOTE — Patient Instructions (Addendum)
Medication Instructions:  Your physician recommends that you continue on your current medications as directed. Please refer to the Current Medication list given to you today.  *If you need a refill on your cardiac medications before your next appointment, please call your pharmacy*   Lab Work: None ordered  If you have labs (blood work) drawn today and your tests are completely normal, you will receive your results only by: MyChart Message (if you have MyChart) OR A paper copy in the mail If you have any lab test that is abnormal or we need to change your treatment, we will call you to review the results.   Testing/Procedures: None ordered   Follow-Up: At [facility], you and your health needs are our priority.  As part of our continuing mission to provide you with exceptional heart care, we have created designated Provider Care Teams.  These Care Teams include your primary Cardiologist (physician) and Advanced Practice Providers (APPs -  Physician Assistants and Nurse Practitioners) who all work together to provide you with the care you need, when you need it.  We recommend signing up for the patient portal called "MyChart".  Sign up information is provided on this After Visit Summary.  MyChart is used to connect with patients for Virtual Visits (Telemedicine).  Patients are able to view lab/test results, encounter notes, upcoming appointments, etc.  Non-urgent messages can be sent to your provider as well.   To learn more about what you can do with MyChart, go to [email or web address].    Your next appointment:   6 month(s)  Provider:   Mark Skains, MD     Other Instructions   

## 2022-06-07 ENCOUNTER — Other Ambulatory Visit: Payer: Self-pay | Admitting: Endocrinology

## 2022-06-07 DIAGNOSIS — E782 Mixed hyperlipidemia: Secondary | ICD-10-CM

## 2022-06-13 ENCOUNTER — Other Ambulatory Visit (INDEPENDENT_AMBULATORY_CARE_PROVIDER_SITE_OTHER): Payer: Medicare Other

## 2022-06-13 DIAGNOSIS — E1165 Type 2 diabetes mellitus with hyperglycemia: Secondary | ICD-10-CM | POA: Diagnosis not present

## 2022-06-13 DIAGNOSIS — E559 Vitamin D deficiency, unspecified: Secondary | ICD-10-CM | POA: Diagnosis not present

## 2022-06-13 DIAGNOSIS — E782 Mixed hyperlipidemia: Secondary | ICD-10-CM

## 2022-06-13 DIAGNOSIS — E785 Hyperlipidemia, unspecified: Secondary | ICD-10-CM

## 2022-06-13 DIAGNOSIS — E063 Autoimmune thyroiditis: Secondary | ICD-10-CM | POA: Diagnosis not present

## 2022-06-13 LAB — LIPID PANEL
Cholesterol: 195 mg/dL (ref 0–200)
HDL: 41 mg/dL (ref >39.00)
LDL Cholesterol: 122 mg/dL — ABNORMAL HIGH (ref 0–99)
NonHDL: 153.53
Total CHOL/HDL Ratio: 5
Triglycerides: 156 mg/dL — ABNORMAL HIGH (ref 0.0–149.0)
VLDL: 31.2 mg/dL (ref 0.0–40.0)

## 2022-06-13 LAB — BASIC METABOLIC PANEL
BUN: 21 mg/dL (ref 6–23)
CO2: 27 mEq/L (ref 19–32)
Calcium: 9.8 mg/dL (ref 8.4–10.5)
Chloride: 98 mEq/L (ref 96–112)
Creatinine, Ser: 0.63 mg/dL (ref 0.40–1.20)
GFR: 90.32 mL/min (ref >60.00)
Glucose, Bld: 173 mg/dL — ABNORMAL HIGH (ref 70–99)
Potassium: 4.4 mEq/L (ref 3.5–5.1)
Sodium: 135 mEq/L (ref 135–145)

## 2022-06-13 LAB — HEMOGLOBIN A1C: Hgb A1c MFr Bld: 7 % — ABNORMAL HIGH (ref 4.6–6.5)

## 2022-06-13 LAB — VITAMIN D 25 HYDROXY (VIT D DEFICIENCY, FRACTURES): VITD: 31.15 ng/mL (ref 30.00–100.00)

## 2022-06-13 LAB — T4, FREE: Free T4: 0.91 ng/dL (ref 0.60–1.60)

## 2022-06-13 LAB — TSH: TSH: 1.61 u[IU]/mL (ref 0.35–5.50)

## 2022-06-15 ENCOUNTER — Ambulatory Visit (INDEPENDENT_AMBULATORY_CARE_PROVIDER_SITE_OTHER): Payer: Medicare Other | Admitting: Endocrinology

## 2022-06-15 ENCOUNTER — Encounter: Payer: Self-pay | Admitting: Endocrinology

## 2022-06-15 ENCOUNTER — Ambulatory Visit: Payer: Medicare Other | Admitting: Endocrinology

## 2022-06-15 VITALS — BP 124/72 | HR 65 | Ht 63.5 in | Wt 162.8 lb

## 2022-06-15 DIAGNOSIS — E782 Mixed hyperlipidemia: Secondary | ICD-10-CM | POA: Diagnosis not present

## 2022-06-15 DIAGNOSIS — E1165 Type 2 diabetes mellitus with hyperglycemia: Secondary | ICD-10-CM | POA: Diagnosis not present

## 2022-06-15 DIAGNOSIS — I2510 Atherosclerotic heart disease of native coronary artery without angina pectoris: Secondary | ICD-10-CM | POA: Diagnosis not present

## 2022-06-15 MED ORDER — TIRZEPATIDE 5 MG/0.5ML ~~LOC~~ SOAJ: 5.0000 mg | SUBCUTANEOUS | 5 refills | Status: DC

## 2022-06-15 NOTE — Patient Instructions (Addendum)
Check blood sugars on waking up 3-4 days a week  Also check blood sugars about 2 hours after meals and do this after different meals by rotation  Recommended blood sugar levels on waking up are 90-130 and about 2 hours after meal is 130-160  Please bring your blood sugar monitor to each visit, thank you  Take 1/2 Am[place]

## 2022-06-15 NOTE — Progress Notes (Signed)
Patient ID: Joanna [name], female   DOB: [birth year], 72 y.o.   MRN: [id number]           Reason for Appointment: Follow-up of endocrinology problems    History of Present Illness:          Diagnosis: Type 2 diabetes mellitus, date of diagnosis: 2010?       Prior history:  On her initial consultation she was advised to start Tanzeum in addition to metformin for multiple benefits including long-term control; was started in 12/15.  Recent history:  A1c is 7, previously as low as 6.2   Non-insulin hypoglycemic drugs the patient is taking are: Metformin 1 g 2x  a day, Trulicity 1.5 mg weekly, Amaryl 1 mg at bedtime.     Glucose patterns and problems identified: She has had difficulty getting Trulicity for the last month or so because of supply issues Has been getting generally higher readings in the morning. She does try to check her sugars fairly regularly She prefers to use the [employer] brand meter as she claims that Medicaid is not paying off on adjustments Also diet has been variable although recently she thinks she is trying to improve her diet especially with cutting back on sweets, she thinks that eliminating or reducing diet sodas helps Her weight is overall about the same recently and has had fluctuation She is planning to start exercise with treadmill. Lab glucose was 173 fasting     Side effects from medications have been: none    Glucose monitoring:  done up to twice a day         Glucometer:  [employer] brand  Blood Glucose readings  from home records   PRE-MEAL Fasting Lunch Dinner Bedtime Overall  Glucose range: 168-211 115     Mean/median:        POST-MEAL PC Breakfast PC Lunch 8 PM  Glucose range: ?  149  92, 134  Mean/median:       Previously: Am 130; PC 140-210,  ac dinner 80-100   Self-care: The diet that the patient has been following is: tries to limit fat intake.      Meals: 3 meals per day. Breakfast is eggs, , sometimes cottage cheese   Lunch is a sandwich  or soup.  Snacks: Cottage cheese and fruit, no sweet drinks juices           Dietician visit, most recent: never      CDE visit: 1/16            Weight history:    Wt Readings from Last 3 Encounters:  06/15/22 162 lb 12.8 oz (73.8 kg)  06/02/22 161 lb (73 kg)  12/08/21 160 lb (72.6 kg)    Glycemic control:   Lab Results  Component Value Date   HGBA1C 7.0 (H) 06/13/2022   HGBA1C 6.6 (H) 12/01/2021   HGBA1C 6.2 05/26/2021   Lab Results  Component Value Date   MICROALBUR 4.4 (H) 12/01/2021   LDLCALC 122 (H) 06/13/2022   CREATININE 0.63 06/13/2022   Lab Results  Component Value Date   FRUCTOSAMINE 262 08/12/2019   FRUCTOSAMINE 247 06/02/2016   FRUCTOSAMINE 274 (H) 07/16/2014    Past diabetes history:  For several years prior to her diagnosis she had been complaining of her feet burning Her blood sugars were mildly increased initially at diagnosis and she did try to control it with diet alone and exercise without getting them back to normal.  Her A1c had continue to  be around 7% with the lowest one 6.7  About 2012 she was started on metformin probably when her A1c was 7.3.   In 2015 her blood sugars had been higher with A1c 8.1%  HYPERLIPIDEMIA: Discussed in review of systems    Allergies as of 06/15/2022       Reactions   Crestor [rosuvastatin]    REACTION: N/V and heartburn, can very small dose    Lipitor [atorvastatin]    REACTION: Hot flashes and flu-like symptoms   Pravastatin Sodium    REACTION: Neck swelling and pain in her shoulders and arms.   Sulfonamide Derivatives    Rxn Unknown   Zetia [ezetimibe]    GI upset   Zocor [simvastatin] Other (See Comments)   GI issues.. Pt unknown of severity   Niacin Nausea And Vomiting        Medication List        Accurate as of June 15, 2022  9:13 AM. If you have any questions, ask your nurse or doctor.          amLODipine 2.5 MG tablet Commonly known as: NORVASC Take 1 tablet (2.5 mg total) by  mouth daily.   amoxicillin 500 MG tablet Commonly known as: AMOXIL Take 4 tablets (2,000 mg total) by mouth as directed. 1 HOUR PRIOR TO DENTAL APPOINTMENTS   aspirin EC 81 MG tablet Take 1 tablet (81 mg total) by mouth daily.   carvedilol 6.25 MG tablet Commonly known as: COREG Take 1 tablet (6.25 mg total) by mouth 2 (two) times daily.   glimepiride 1 MG tablet Commonly known as: AMARYL TAKE 1 TABLET BY MOUTH EVERYDAY AT BEDTIME   glucose blood test strip Commonly known as: ONE TOUCH ULTRA TEST USE TO CHECK BLOOD SUGAR TWICE DAILY  Dx:E11.65   levothyroxine 112 MCG tablet Commonly known as: SYNTHROID Take 1 tablet (112 mcg total) by mouth daily.   levothyroxine 125 MCG tablet Commonly known as: Synthroid TAKE 1 TABLET BY MOUTH DAILY EXCEPT 1/2 TABLET ON SUNDAYS   Magnesium 500 MG Tabs Take 500 mg by mouth 2 (two) times daily.   metFORMIN 1000 MG tablet Commonly known as: GLUCOPHAGE TAKE 1 TABLET BY MOUTH TWICE A DAY   nitroGLYCERIN 0.4 MG SL tablet Commonly known as: NITROSTAT Place 1 tablet (0.4 mg total) under the tongue every 5 (five) minutes x 3 doses as needed for chest pain.   OneTouch Delica Lancets 77A Misc by Does not apply route. Use to test blood sugars once daily   OneTouch Verio w/Device Kit by Does not apply route. Use to check blood sugars once daily.   ramipril 10 MG capsule Commonly known as: ALTACE TAKE 1 CAPSULE BY MOUTH EVERY DAY   rosuvastatin 5 MG tablet Commonly known as: CRESTOR TAKE 1/2 TABLET BY MOUTH EVERY DAY What changed:  how much to take when to take this   Trulicity 1.5 JO/8.7OM Sopn Generic drug: Dulaglutide INJECT 1.5 MG INTO THE SKIN ONCE WEEKLY AS DIRECTED        Allergies:  Allergies  Allergen Reactions   Crestor [Rosuvastatin]     REACTION: N/V and heartburn, can very small dose    Lipitor [Atorvastatin]     REACTION: Hot flashes and flu-like symptoms   Pravastatin Sodium     REACTION: Neck swelling and  pain in her shoulders and arms.   Sulfonamide Derivatives     Rxn Unknown   Zetia [Ezetimibe]     GI upset   Zocor [Simvastatin]  Other (See Comments)    GI issues.. Pt unknown of severity   Niacin Nausea And Vomiting    Past Medical History:  Diagnosis Date   Abnormal liver function tests    Adenomatous colon polyp    CAD (coronary artery disease) 05/09/2018   a. LHC 05/09/18: DES to mid/dist LAD, mod AS   Cerebrovascular disease    a. CT 04/2016 -calcific plaque at the origin LEFT vertebral contributing to severe stenosis.   Diverticulitis of colon    Fatty liver    a. mild fatty liver infiltration by CT 2014.   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 TAli Loweand Dr. BCyndia Bent  Statin intolerance    Stroke ([facility]    a. right thalamic infarct in 04/2016 felt by neuro to be due to small vessel disease.   [place]     Past Surgical History:  Procedure Laterality Date   CARDIAC CATHETERIZATION     CATARACT EXTRACTION W/ INTRAOCULAR LENS  IMPLANT, BILATERAL Bilateral 08/2016   COLONOSCOPY W/ BIOPSIES AND POLYPECTOMY  05/19/2005   adenomatous polyps   CORONARY STENT INTERVENTION N/A 05/09/2018   Procedure: CORONARY STENT INTERVENTION;  Surgeon: ENelva Bush MD;  [facility];  Service: Cardiovascular;  Laterality: N/A;   INTRAOPERATIVE TRANSTHORACIC ECHOCARDIOGRAM N/A 06/21/2021   Procedure: INTRAOPERATIVE TRANSTHORACIC ECHOCARDIOGRAM;  Surgeon: TEarly Osmond MD;  [place] [facility]  Service: Open Heart Surgery;  Laterality: N/A;   LEFT HEART CATH AND CORONARY ANGIOGRAPHY N/A 05/09/2018    Procedure: LEFT HEART CATH AND CORONARY ANGIOGRAPHY;  Surgeon: ENelva Bush MD;  [facility];  Service: Cardiovascular;  Laterality: N/A;   MELANOMA EXCISION  1980s   mid upper stomach   NEPHROLITHOTOMY Left 01/27/2013   Procedure: NEPHROLITHOTOMY PERCUTANEOUS;  Surgeon: LDutch Gray MD;  Location: [facility];  Service: Urology;  Laterality: Left;   RIGHT/LEFT HEART CATH AND CORONARY ANGIOGRAPHY N/A 05/04/2021   Procedure: RIGHT/LEFT HEART CATH AND CORONARY ANGIOGRAPHY;  Surgeon: TEarly Osmond MD;  [facility] [facility];  Service: Cardiovascular;  Laterality: N/A;   TRANSCATHETER AORTIC VALVE REPLACEMENT, TRANSFEMORAL N/A 06/21/2021   Procedure: TRANSCATHETER AORTIC VALVE REPLACEMENT, TRANSFEMORAL USING A 23 MM EDWARDS SAPIEN 3 ULTRA  AORTIC VALVE;  Surgeon: TEarly Osmond MD;  [place] [facility]  Service: Open Heart Surgery;  Laterality: N/A;   TUBAL LIGATION     WISDOM TEETH EXTRACTED      Family History  Problem Relation Age of Onset   Hyperlipidemia Mother    Neurofibromatosis Father    Thyroid disease Daughter    Hyperlipidemia Sister    Hyperlipidemia Brother    Diabetes Neg Hx    Stroke Neg Hx    Colon cancer Neg Hx    Esophageal cancer Neg Hx    Pancreatic cancer Neg Hx    Liver cancer Neg Hx     Social History:  reports that she has never smoked. She has never used smokeless tobacco. She reports that she does not drink alcohol and  does not use drugs.     Review of Systems        LIPIDS: she has long-standing severe hypercholesterolemia and has been intolerant to all statin drugs with various reactions Lipitor caused flulike symptoms, abdominal bloating and hot flashes, pravastatin caused neck swelling and upper body pain, Zocor caused GI upset.  Was not able to tolerate Zetia as she thinks it upset his stomach She did not want to continue Repatha because of cost  LDL particle number previously over 2500 with ideal levels of below 1000  She is  followed by cardiology and has been only able to take 1/4 tablet of Crestor q 3 days  Again she thinks that she has GI side effects with taking it every day and takes it at bedtime  LDL is over the last couple of years gradually increasing, has had periodic follow-up with lipid clinic She has previously refused to consider injectable drugs and today even though explaining the mode of action of Leqvio she is refusing to consider this and she thinks she can control it with diet  Also taking small doses of OTC fish oil      Lab Results  Component Value Date   CHOL 195 06/13/2022   CHOL 190 05/26/2021   CHOL 172 02/10/2021   Lab Results  Component Value Date   HDL 41.00 06/13/2022   HDL 46.90 05/26/2021   HDL 42.10 02/10/2021   Lab Results  Component Value Date   LDLCALC 122 (H) 06/13/2022   LDLCALC 118 (H) 05/26/2021   LDLCALC 107 (H) 02/10/2021   Lab Results  Component Value Date   TRIG 156.0 (H) 06/13/2022   TRIG 125.0 05/26/2021   TRIG 115.0 02/10/2021   Lab Results  Component Value Date   CHOLHDL 5 06/13/2022   CHOLHDL 4 05/26/2021   CHOLHDL 4 02/10/2021   Lab Results  Component Value Date   LDLDIRECT 209 (H) 03/19/2018   LDLDIRECT 170.0 12/11/2016   LDLDIRECT 178.0 08/22/2016                Thyroid:    She has had hypothyroidism for about 10 years and initially had a goiter also; highest TSH probably about 9.2 She prefers to take Brand name Synthroid and she thinks that levothyroxine does not work  She had been taking 100 mcg of Synthroid prior to her visit in 1/21 and this was lower than the recommended dose With this she was having more fatigue, sleepiness, low energy and cold intolerance along with weight gain  She is on generic levothyroxine and now taking 112 mcg daily  Her TSH is now consistently normal  Thyroid functions as below:  Lab Results  Component Value Date   TSH 1.61 06/13/2022   TSH 0.40 12/01/2021   TSH 0.38 05/26/2021   FREET4 0.91  06/13/2022   FREET4 1.16 12/01/2021   FREET4 1.05 05/26/2021   Lab Results  Component Value Date   TSH 1.61 06/13/2022   TSH 0.40 12/01/2021   TSH 0.38 05/26/2021   TSH 0.24 (L) 02/10/2021   TSH 1.43 07/07/2020   TSH 2.52 12/29/2019        The blood pressure is being treated with 5 mg amlodipine and 10 mg ramipril along with metoprolol This is also followed by cardiologist She tends to have high reading on the right arm    BP Readings from Last 3 Encounters:  06/15/22 124/72  06/02/22 104/70  12/08/21 120/74    FATTY liver: Has had  high liver function levels for the last few years  CT scan of abdomen in 2014 showed fatty infiltration of the liver  Lab Results  Component Value Date   ALT 38 (H) 12/01/2021     LABS:  Lab on 06/13/2022  Component Date Value Ref Range Status   Hgb A1c MFr Bld 06/13/2022 7.0 (H)  4.6 - 6.5 % Final   Glycemic Control Guidelines for People with Diabetes:Non Diabetic:  <6%Goal of Therapy: <7%Additional Action Suggested:  >8%    Sodium 06/13/2022 135  135 - 145 mEq/L Final   Potassium 06/13/2022 4.4  3.5 - 5.1 mEq/L Final   Chloride 06/13/2022 98  96 - 112 mEq/L Final   CO2 06/13/2022 27  19 - 32 mEq/L Final   Glucose, Bld 06/13/2022 173 (H)  70 - 99 mg/dL Final   BUN 06/13/2022 21  6 - 23 mg/dL Final   Creatinine, Ser 06/13/2022 0.63  0.40 - 1.20 mg/dL Final   GFR 06/13/2022 90.32  >60.00 mL/min Final   Calculated using the CKD-EPI Creatinine Equation (2021)   Calcium 06/13/2022 9.8  8.4 - 10.5 mg/dL Final   TSH 06/13/2022 1.61  0.35 - 5.50 uIU/mL Final   Free T4 06/13/2022 0.91  0.60 - 1.60 ng/dL Final   Comment: Specimens from patients who are undergoing biotin therapy and /or ingesting biotin supplements may contain high levels of biotin.  The higher biotin concentration in these specimens interferes with this Free T4 assay.  Specimens that contain high levels  of biotin may cause false high results for this Free T4 assay.  Please  interpret results in light of the total clinical presentation of the patient.     VITD 06/13/2022 31.15  30.00 - 100.00 ng/mL Final   Cholesterol 06/13/2022 195  0 - 200 mg/dL Final   ATP III Classification       Desirable:  < 200 mg/dL               Borderline High:  200 - 239 mg/dL          High:  > = 240 mg/dL   Triglycerides 06/13/2022 156.0 (H)  0.0 - 149.0 mg/dL Final   Normal:  <150 mg/dLBorderline High:  150 - 199 mg/dL   HDL 06/13/2022 41.00  >39.00 mg/dL Final   VLDL 06/13/2022 31.2  0.0 - 40.0 mg/dL Final   LDL Cholesterol 06/13/2022 122 (H)  0 - 99 mg/dL Final   Total CHOL/HDL Ratio 06/13/2022 5   Final                  Men          Women1/2 Average Risk     3.4          3.3Average Risk          5.0          4.42X Average Risk          9.6          7.13X Average Risk          15.0          11.0                       NonHDL 06/13/2022 153.53   Final   NOTE:  Non-HDL goal should be 30 mg/dL higher than patient's LDL goal (i.e. LDL goal of < 70 mg/dL, would have non-HDL goal of < 100 mg/dL)  Physical Examination:  BP 124/72   Pulse 65   Ht 5' 3.5" (1.613 m)   Wt 162 lb 12.8 oz (73.8 kg)   SpO2 97%   BMI 28.39 kg/m            ASSESSMENT/PLAN:   Diabetes type 2    See history of present illness for details of her current blood sugar patterns and management  Her A1c is slightly higher at 6.6, previously at 6.2  She is taking metformin, Amaryl and Trulicity 1.5 mg  With not getting her to Trulicity lately her blood sugars are trending higher especially fasting Using a [employer] brand monitor Discussed that we can try her on Mounjaro which would be at least as effective with a 2.5 mg sample dose once a week Written prescription also given for 5 mg for the second month onwards   HYPOTHYROIDISM: Her TSH is back to normal She will continue to take 112 mcg daily  LIPIDS: Currently followed by cardiology  Refuses to consider any injectable medication She will discuss this  further with the lipid clinic but she thinks she can try to increase her Crestor to half tablet 3 days a week  History of vitamin D deficiency: Her level is quite normal    Follow-up in 6 months at her request  There are no Patient Instructions on file for this visit.    Elayne Snare 06/15/2022, 9:13 AM   Note: This office note was prepared with Dragon voice recognition system technology. Any transcriptional errors that result from this process are unintentional.

## 2022-07-12 ENCOUNTER — Other Ambulatory Visit: Payer: Self-pay | Admitting: Cardiology

## 2022-07-14 ENCOUNTER — Other Ambulatory Visit: Payer: Self-pay | Admitting: Endocrinology

## 2022-09-14 ENCOUNTER — Other Ambulatory Visit: Payer: Self-pay | Admitting: Cardiology

## 2022-11-14 DIAGNOSIS — D225 Melanocytic nevi of trunk: Secondary | ICD-10-CM | POA: Diagnosis not present

## 2022-11-14 DIAGNOSIS — Z85828 Personal history of other malignant neoplasm of skin: Secondary | ICD-10-CM | POA: Diagnosis not present

## 2022-11-14 DIAGNOSIS — D2372 Other benign neoplasm of skin of left lower limb, including hip: Secondary | ICD-10-CM | POA: Diagnosis not present

## 2022-11-14 DIAGNOSIS — L718 Other rosacea: Secondary | ICD-10-CM | POA: Diagnosis not present

## 2022-11-14 DIAGNOSIS — Z85820 Personal history of malignant melanoma of skin: Secondary | ICD-10-CM | POA: Diagnosis not present

## 2022-11-14 DIAGNOSIS — L821 Other seborrheic keratosis: Secondary | ICD-10-CM | POA: Diagnosis not present

## 2022-11-28 DIAGNOSIS — Z961 Presence of intraocular lens: Secondary | ICD-10-CM | POA: Diagnosis not present

## 2022-11-28 DIAGNOSIS — H43813 Vitreous degeneration, bilateral: Secondary | ICD-10-CM | POA: Diagnosis not present

## 2022-11-28 DIAGNOSIS — H524 Presbyopia: Secondary | ICD-10-CM | POA: Diagnosis not present

## 2022-11-28 DIAGNOSIS — E119 Type 2 diabetes mellitus without complications: Secondary | ICD-10-CM | POA: Diagnosis not present

## 2022-11-28 DIAGNOSIS — H52203 Unspecified astigmatism, bilateral: Secondary | ICD-10-CM | POA: Diagnosis not present

## 2022-11-28 DIAGNOSIS — D23112 Other benign neoplasm of skin of right lower eyelid, including canthus: Secondary | ICD-10-CM | POA: Diagnosis not present

## 2022-12-06 ENCOUNTER — Other Ambulatory Visit (INDEPENDENT_AMBULATORY_CARE_PROVIDER_SITE_OTHER): Payer: Medicare Other

## 2022-12-06 DIAGNOSIS — E782 Mixed hyperlipidemia: Secondary | ICD-10-CM | POA: Diagnosis not present

## 2022-12-06 DIAGNOSIS — E1165 Type 2 diabetes mellitus with hyperglycemia: Secondary | ICD-10-CM

## 2022-12-06 LAB — TSH: TSH: 1.52 u[IU]/mL (ref 0.35–5.50)

## 2022-12-06 LAB — MICROALBUMIN / CREATININE URINE RATIO
Creatinine,U: 78.2 mg/dL
Microalb Creat Ratio: 4.5 mg/g (ref 0.0–30.0)
Microalb, Ur: 3.5 mg/dL — ABNORMAL HIGH (ref 0.0–1.9)

## 2022-12-06 LAB — COMPREHENSIVE METABOLIC PANEL
ALT: 41 U/L — ABNORMAL HIGH (ref 0–35)
AST: 23 U/L (ref 0–37)
Albumin: 4.5 g/dL (ref 3.5–5.2)
Alkaline Phosphatase: 80 U/L (ref 39–117)
BUN: 17 mg/dL (ref 6–23)
CO2: 29 mEq/L (ref 19–32)
Calcium: 10.1 mg/dL (ref 8.4–10.5)
Chloride: 98 mEq/L (ref 96–112)
Creatinine, Ser: 0.59 mg/dL (ref 0.40–1.20)
GFR: 91.45 mL/min (ref >60.00)
Glucose, Bld: 205 mg/dL — ABNORMAL HIGH (ref 70–99)
Potassium: 4.8 mEq/L (ref 3.5–5.1)
Sodium: 137 mEq/L (ref 135–145)
Total Bilirubin: 0.7 mg/dL (ref 0.2–1.2)
Total Protein: 7.2 g/dL (ref 6.0–8.3)

## 2022-12-06 LAB — LIPID PANEL
Cholesterol: 207 mg/dL — ABNORMAL HIGH (ref 0–200)
HDL: 42.7 mg/dL (ref >39.00)
LDL Cholesterol: 128 mg/dL — ABNORMAL HIGH (ref 0–99)
NonHDL: 164.32
Total CHOL/HDL Ratio: 5
Triglycerides: 184 mg/dL — ABNORMAL HIGH (ref 0.0–149.0)
VLDL: 36.8 mg/dL (ref 0.0–40.0)

## 2022-12-06 LAB — HEMOGLOBIN A1C: Hgb A1c MFr Bld: 7.7 % — ABNORMAL HIGH (ref 4.6–6.5)

## 2022-12-06 LAB — T4, FREE: Free T4: 0.96 ng/dL (ref 0.60–1.60)

## 2022-12-11 ENCOUNTER — Encounter: Payer: Self-pay | Admitting: Endocrinology

## 2022-12-11 ENCOUNTER — Ambulatory Visit (INDEPENDENT_AMBULATORY_CARE_PROVIDER_SITE_OTHER): Payer: Medicare Other | Admitting: Endocrinology

## 2022-12-11 VITALS — BP 130/70 | HR 67 | Ht 63.5 in | Wt 164.8 lb

## 2022-12-11 DIAGNOSIS — Z7984 Long term (current) use of oral hypoglycemic drugs: Secondary | ICD-10-CM | POA: Diagnosis not present

## 2022-12-11 DIAGNOSIS — E782 Mixed hyperlipidemia: Secondary | ICD-10-CM | POA: Diagnosis not present

## 2022-12-11 DIAGNOSIS — E063 Autoimmune thyroiditis: Secondary | ICD-10-CM

## 2022-12-11 DIAGNOSIS — E1165 Type 2 diabetes mellitus with hyperglycemia: Secondary | ICD-10-CM | POA: Diagnosis not present

## 2022-12-11 LAB — GLUCOSE, POCT (MANUAL RESULT ENTRY): POC Glucose: 128 mg/dl — AB (ref 70–99)

## 2022-12-11 MED ORDER — TIRZEPATIDE 5 MG/0.5ML ~~LOC~~ SOAJ: 5.0000 mg | SUBCUTANEOUS | 2 refills | Status: DC

## 2022-12-11 NOTE — Patient Instructions (Signed)
Check blood sugars on waking up 4  days a week  Also check blood sugars about 2 hours after meals and do this after different meals by rotation  Recommended blood sugar levels on waking up are 90-130 and about 2 hours after meal is 130-160  Please bring your blood sugar monitor to each visit, thank you  

## 2022-12-11 NOTE — Progress Notes (Signed)
Patient ID: Joanna [name], female   DOB: [birth year], 72 y.o.   MRN: [id number]           Reason for Appointment: Follow-up of endocrinology problems    History of Present Illness:          Diagnosis: Type 2 diabetes mellitus, date of diagnosis: 2010?       Prior history:  On her initial consultation she was advised to start Tanzeum in addition to metformin for multiple benefits including long-term control; was started in 12/15.  Recent history:  A1c is 7.7, previously as low as 6.2   Non-insulin hypoglycemic drugs the patient is taking are: Metformin 1 g twice a day, , Amaryl 1 mg at 4 PM.     Glucose patterns and problems identified: She was not able to get Trulicity on her last visit but when she was switched to [facility] she took it for only a month She had been given a written prescription for the 5 mg dose to get filled but she did not and also did not let us know Despite having consistently high readings since stopping Mounjaro has not called to report the problem She says she is trying hard to watch her diet her blood sugars are higher after dinner and overnight Overall still watching her portions and avoiding sweets Her weight is overall about the same since her last visit She is not exercising with treadmill, just doing some walking. Lab glucose was 205 fasting As before she does not bring her monitor for review     Side effects from medications have been: none    Glucose monitoring:  done up to twice a day         Glucometer:  [employer] brand  Blood Glucose readings  from recall   PRE-MEAL Fasting Lunch Dinner Bedtime Overall  Glucose range: 190   190-230   Mean/median:        POST-MEAL PC Breakfast PC Lunch PC Dinner  Glucose range:   ?  Mean/median:      Prior    PRE-MEAL Fasting Lunch Dinner Bedtime Overall  Glucose range: 168-211 115     Mean/median:        POST-MEAL PC Breakfast PC Lunch 8 PM  Glucose range: ?  149  92, 134  Mean/median:        Self-care: The diet that the patient has been following is: tries to limit fat intake.      Meals: 3 meals per day. Breakfast is eggs, , sometimes cottage cheese   Lunch is a sandwich or soup.  Snacks: Cottage cheese and fruit, no sweet drinks juices           Dietician visit, most recent: never      CDE visit: 1/16            Weight history:    Wt Readings from Last 3 Encounters:  12/11/22 164 lb 12.8 oz (74.8 kg)  06/15/22 162 lb 12.8 oz (73.8 kg)  06/02/22 161 lb (73 kg)    Glycemic control:   Lab Results  Component Value Date   HGBA1C 7.7 Repeated and verified X2. (H) 12/06/2022   HGBA1C 7.0 (H) 06/13/2022   HGBA1C 6.6 (H) 12/01/2021   Lab Results  Component Value Date   MICROALBUR 3.5 (H) 12/06/2022   LDLCALC 128 (H) 12/06/2022   CREATININE 0.59 12/06/2022   Lab Results  Component Value Date   FRUCTOSAMINE 262 08/12/2019   FRUCTOSAMINE 247 06/02/2016  FRUCTOSAMINE 274 (H) 07/16/2014    Past diabetes history:  For several years prior to her diagnosis she had been complaining of her feet burning Her blood sugars were mildly increased initially at diagnosis and she did try to control it with diet alone and exercise without getting them back to normal.  Her A1c had continue to be around 7% with the lowest one 6.7  About 2012 she was started on metformin probably when her A1c was 7.3.   In 2015 her blood sugars had been higher with A1c 8.1%  HYPERLIPIDEMIA: Discussed in review of systems    Allergies as of 12/11/2022       Reactions   Crestor [rosuvastatin]    REACTION: N/V and heartburn, can very small dose    Lipitor [atorvastatin]    REACTION: Hot flashes and flu-like symptoms   Pravastatin Sodium    REACTION: Neck swelling and pain in her shoulders and arms.   Sulfonamide Derivatives    Rxn Unknown   Zetia [ezetimibe]    GI upset   Zocor [simvastatin] Other (See Comments)   GI issues.. Pt unknown of severity   Niacin Nausea And Vomiting         Medication List        Accurate as of December 11, 2022  8:39 PM. If you have any questions, ask your nurse or doctor.          amLODipine 2.5 MG tablet Commonly known as: NORVASC TAKE 1 TABLET BY MOUTH EVERY DAY   amoxicillin 500 MG tablet Commonly known as: AMOXIL Take 4 tablets (2,000 mg total) by mouth as directed. 1 HOUR PRIOR TO DENTAL APPOINTMENTS   aspirin EC 81 MG tablet Take 1 tablet (81 mg total) by mouth daily.   carvedilol 6.25 MG tablet Commonly known as: COREG Take 1 tablet (6.25 mg total) by mouth 2 (two) times daily.   glimepiride 1 MG tablet Commonly known as: AMARYL TAKE 1 TABLET BY MOUTH EVERYDAY AT BEDTIME   glucose blood test strip Commonly known as: ONE TOUCH ULTRA TEST USE TO CHECK BLOOD SUGAR TWICE DAILY  Dx:E11.65   levothyroxine 112 MCG tablet Commonly known as: SYNTHROID Take 1 tablet (112 mcg total) by mouth daily.   levothyroxine 125 MCG tablet Commonly known as: Synthroid TAKE 1 TABLET BY MOUTH DAILY EXCEPT 1/2 TABLET ON SUNDAYS   Magnesium 500 MG Tabs Take 500 mg by mouth 2 (two) times daily.   metFORMIN 1000 MG tablet Commonly known as: GLUCOPHAGE TAKE 1 TABLET BY MOUTH TWICE A DAY   nitroGLYCERIN 0.4 MG SL tablet Commonly known as: NITROSTAT Place 1 tablet (0.4 mg total) under the tongue every 5 (five) minutes x 3 doses as needed for chest pain.   OneTouch Delica Lancets 33G Misc by Does not apply route. Use to test blood sugars once daily   OneTouch Verio w/Device Kit by Does not apply route. Use to check blood sugars once daily.   ramipril 10 MG capsule Commonly known as: ALTACE TAKE 1 CAPSULE BY MOUTH EVERY DAY   rosuvastatin 5 MG tablet Commonly known as: CRESTOR TAKE 1/2 TABLET BY MOUTH EVERY DAY   tirzepatide 5 MG/0.5ML Pen Commonly known as: MOUNJARO Inject 5 mg into the skin once a week.        Allergies:  Allergies  Allergen Reactions   Crestor [Rosuvastatin]     REACTION: N/V and heartburn,  can very small dose    Lipitor [Atorvastatin]     REACTION: Hot  flashes and flu-like symptoms   Pravastatin Sodium     REACTION: Neck swelling and pain in her shoulders and arms.   Sulfonamide Derivatives     Rxn Unknown   Zetia [Ezetimibe]     GI upset   Zocor [Simvastatin] Other (See Comments)    GI issues.. Pt unknown of severity   Niacin Nausea And Vomiting    Past Medical History:  Diagnosis Date   Abnormal liver function tests    Adenomatous colon polyp    CAD (coronary artery disease) 05/09/2018   a. LHC 05/09/18: DES to mid/dist LAD, mod AS   Cerebrovascular disease    a. CT 04/2016 -calcific plaque at the origin LEFT vertebral contributing to severe stenosis.   Diverticulitis of colon    Fatty liver    a. mild fatty liver infiltration by CT 2014.   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 Lynnette Caffey and Dr. Laneta Simmers   Statin intolerance    Stroke [facility])    a. right thalamic infarct in 04/2016 felt by neuro to be due to small vessel disease.   Type II diabetes mellitus (HCC)     Past Surgical History:  Procedure Laterality Date   CARDIAC CATHETERIZATION     CATARACT EXTRACTION W/ INTRAOCULAR LENS  IMPLANT, BILATERAL Bilateral 08/2016   COLONOSCOPY W/ BIOPSIES AND POLYPECTOMY  05/19/2005   adenomatous polyps   CORONARY STENT INTERVENTION N/A 05/09/2018   Procedure: CORONARY STENT INTERVENTION;  Surgeon: Yvonne Kendall, MD;  Location: [facility] INVASIVE CV LAB;  Service: Cardiovascular;  Laterality: N/A;   INTRAOPERATIVE TRANSTHORACIC ECHOCARDIOGRAM N/A 06/21/2021   Procedure: INTRAOPERATIVE TRANSTHORACIC ECHOCARDIOGRAM;  Surgeon:  Orbie Pyo, MD;  Location: [facility] OR;  Service: Open Heart Surgery;  Laterality: N/A;   LEFT HEART CATH AND CORONARY ANGIOGRAPHY N/A 05/09/2018   Procedure: LEFT HEART CATH AND CORONARY ANGIOGRAPHY;  Surgeon: Yvonne Kendall, MD;  Location: [facility] INVASIVE CV LAB;  Service: Cardiovascular;  Laterality: N/A;   MELANOMA EXCISION  1980s   mid upper stomach   NEPHROLITHOTOMY Left 01/27/2013   Procedure: NEPHROLITHOTOMY PERCUTANEOUS;  Surgeon: Crecencio Mc, MD;  Location: [facility] ORS;  Service: Urology;  Laterality: Left;   RIGHT/LEFT HEART CATH AND CORONARY ANGIOGRAPHY N/A 05/04/2021   Procedure: RIGHT/LEFT HEART CATH AND CORONARY ANGIOGRAPHY;  Surgeon: Orbie Pyo, MD;  Location: [facility] CV [facility];  Service: Cardiovascular;  Laterality: N/A;   TRANSCATHETER AORTIC VALVE REPLACEMENT, TRANSFEMORAL N/A 06/21/2021   Procedure: TRANSCATHETER AORTIC VALVE REPLACEMENT, TRANSFEMORAL USING A 23 MM EDWARDS SAPIEN 3 ULTRA  AORTIC VALVE;  Surgeon: Orbie Pyo, MD;  Location: [facility] OR;  Service: Open Heart Surgery;  Laterality: N/A;   TUBAL LIGATION     WISDOM TEETH EXTRACTED      Family History  Problem Relation Age of Onset   Hyperlipidemia Mother    Neurofibromatosis Father    Thyroid disease Daughter    Hyperlipidemia Sister    Hyperlipidemia Brother    Diabetes Neg Hx    Stroke Neg Hx    Colon cancer Neg Hx  Esophageal cancer Neg Hx    Pancreatic cancer Neg Hx    Liver cancer Neg Hx     Social History:  reports that she has never smoked. She has never used smokeless tobacco. She reports that she does not drink alcohol and does not use drugs.     Review of Systems        LIPIDS: she has long-standing severe hypercholesterolemia and has been intolerant to all statin drugs with various reactions Lipitor caused flulike symptoms, abdominal bloating and hot flashes, pravastatin caused neck swelling and upper body pain, Zocor caused GI upset.  Was not able to tolerate Zetia as she thinks it upset  his stomach She did not want to continue Repatha because of cost  LDL particle number previously over 2500 with ideal levels of below 1000  She is followed by cardiology and has been only able to take 1/4 tablet of Crestor every other day  Again she thinks that she has GI side effects with taking it every day and takes it at bedtime  LDL is over the last couple of years gradually increasing, has had periodic follow-up with lipid clinic She has previously refused to consider injectable drugs including Leqvio  LDL is gradually increasing Also taking small doses of OTC fish oil      Lab Results  Component Value Date   CHOL 207 (H) 12/06/2022   CHOL 195 06/13/2022   CHOL 190 05/26/2021   Lab Results  Component Value Date   HDL 42.70 12/06/2022   HDL 41.00 06/13/2022   HDL 46.90 05/26/2021   Lab Results  Component Value Date   LDLCALC 128 (H) 12/06/2022   LDLCALC 122 (H) 06/13/2022   LDLCALC 118 (H) 05/26/2021   Lab Results  Component Value Date   TRIG 184.0 (H) 12/06/2022   TRIG 156.0 (H) 06/13/2022   TRIG 125.0 05/26/2021   Lab Results  Component Value Date   CHOLHDL 5 12/06/2022   CHOLHDL 5 06/13/2022   CHOLHDL 4 05/26/2021   Lab Results  Component Value Date   LDLDIRECT 209 (H) 03/19/2018   LDLDIRECT 170.0 12/11/2016   LDLDIRECT 178.0 08/22/2016                Thyroid:    She has had hypothyroidism for about 10 years and initially had a goiter also; highest TSH probably about 9.2 She prefers to take Brand name Synthroid and she thinks that levothyroxine does not work  She had been taking 100 mcg of Synthroid prior to her visit in 1/21 and this was lower than the recommended dose With this she was having more fatigue, sleepiness, low energy and cold intolerance along with weight gain  She is on generic levothyroxine and again taking 112 mcg daily  Her TSH is consistently normal  Thyroid functions as below:  Lab Results  Component Value Date   TSH 1.52  12/06/2022   TSH 1.61 06/13/2022   TSH 0.40 12/01/2021   FREET4 0.96 12/06/2022   FREET4 0.91 06/13/2022   FREET4 1.16 12/01/2021   Lab Results  Component Value Date   TSH 1.52 12/06/2022   TSH 1.61 06/13/2022   TSH 0.40 12/01/2021   TSH 0.38 05/26/2021   TSH 0.24 (L) 02/10/2021   TSH 1.43 07/07/2020        THE BLOOD PRESSURE is being treated with 5 mg amlodipine and 10 mg ramipril along with metoprolol This is also followed by cardiologist She tends to have high reading on the  right arm   BP Readings from Last 3 Encounters:  12/11/22 130/70  06/15/22 124/72  06/02/22 104/70    FATTY liver: Has had high liver function levels for the last few years  CT scan of abdomen in 2014 showed fatty infiltration of the liver  Lab Results  Component Value Date   ALT 41 (H) 12/06/2022     LABS:  Office Visit on 12/11/2022  Component Date Value Ref Range Status   POC Glucose 12/11/2022 128 (A)  70 - 99 mg/dl Final  Lab on 40/98/1191  Component Date Value Ref Range Status   Cholesterol 12/06/2022 207 (H)  0 - 200 mg/dL Final   ATP III Classification       Desirable:  < 200 mg/dL               Borderline High:  200 - 239 mg/dL          High:  > = 478 mg/dL   Triglycerides 29/56/2130 184.0 (H)  0.0 - 149.0 mg/dL Final   Normal:  <865 mg/dLBorderline High:  150 - 199 mg/dL   HDL 78/46/9629 52.84  >39.00 mg/dL Final   VLDL 13/24/4010 36.8  0.0 - 40.0 mg/dL Final   LDL Cholesterol 12/06/2022 128 (H)  0 - 99 mg/dL Final   Total CHOL/HDL Ratio 12/06/2022 5   Final                  Men          Women1/2 Average Risk     3.4          3.3Average Risk          5.0          4.42X Average Risk          9.6          7.13X Average Risk          15.0          11.0                       NonHDL 12/06/2022 164.32   Final   NOTE:  Non-HDL goal should be 30 mg/dL higher than patient's LDL goal (i.e. LDL goal of < 70 mg/dL, would have non-HDL goal of < 100 mg/dL)   Hgb U7O MFr Bld 53/66/4403 7.7  Repeated and verified X2. (H)  4.6 - 6.5 % Final   Glycemic Control Guidelines for People with Diabetes:Non Diabetic:  <6%Goal of Therapy: <7%Additional Action Suggested:  >8%    Sodium 12/06/2022 137  135 - 145 mEq/L Final   Potassium 12/06/2022 4.8  3.5 - 5.1 mEq/L Final   Chloride 12/06/2022 98  96 - 112 mEq/L Final   CO2 12/06/2022 29  19 - 32 mEq/L Final   Glucose, Bld 12/06/2022 205 (H)  70 - 99 mg/dL Final   BUN 47/42/5956 17  6 - 23 mg/dL Final   Creatinine, Ser 12/06/2022 0.59  0.40 - 1.20 mg/dL Final   Total Bilirubin 12/06/2022 0.7  0.2 - 1.2 mg/dL Final   Alkaline Phosphatase 12/06/2022 80  39 - 117 U/L Final   AST 12/06/2022 23  0 - 37 U/L Final   ALT 12/06/2022 41 (H)  0 - 35 U/L Final   Total Protein 12/06/2022 7.2  6.0 - 8.3 g/dL Final   Albumin 38/75/6433 4.5  3.5 - 5.2 g/dL Final   GFR 29/51/8841 91.45  >60.00 mL/min Final  Calculated using the CKD-EPI Creatinine Equation (2021)   Calcium 12/06/2022 10.1  8.4 - 10.5 mg/dL Final   Microalb, Ur 16/02/9603 3.5 (H)  0.0 - 1.9 mg/dL Final   Creatinine,U 54/01/8118 78.2  mg/dL Final   Microalb Creat Ratio 12/06/2022 4.5  0.0 - 30.0 mg/g Final   TSH 12/06/2022 1.52  0.35 - 5.50 uIU/mL Final   Free T4 12/06/2022 0.96  0.60 - 1.60 ng/dL Final   Comment: Specimens from patients who are undergoing biotin therapy and /or ingesting biotin supplements may contain high levels of biotin.  The higher biotin concentration in these specimens interferes with this Free T4 assay.  Specimens that contain high levels  of biotin may cause false high results for this Free T4 assay.  Please interpret results in light of the total clinical presentation of the patient.      Physical Examination:  BP 130/70 (BP Location: Right Arm, Patient Position: Sitting, Cuff Size: Normal)   Pulse 67   Ht 5' 3.5" (1.613 m)   Wt 164 lb 12.8 oz (74.8 kg)   SpO2 94%   BMI 28.74 kg/m       Diabetic Foot Exam - Simple   Simple Foot Form Diabetic Foot exam  was performed with the following findings: Yes   Visual Inspection No deformities, no ulcerations, no other skin breakdown bilaterally: Yes Sensation Testing Intact to touch and monofilament testing bilaterally: Yes Pulse Check Posterior Tibialis and Dorsalis pulse intact bilaterally: Yes Comments         ASSESSMENT/PLAN:   Diabetes type 2    See history of present illness for details of her current blood sugar patterns and management  Her A1c is progressively higher at 7.7   She is taking metformin, Amaryl and was on Trulicity 1.5 mg  With not getting her Mounjaro or Trulicity since about March her blood sugars are trending higher especially fasting but also overnight As before is not bring her monitor for review but her fasting lab glucose was 205 She is doing some walking but not enough exercise She has no difficulty getting Mounjaro covered by insurance and she is able to afford this  Also she wants to try the freestyle libre sensor to help her monitor her blood sugars closely and she is willing to pay out-of-pocket for this Sample of this is given and she will have her daughter help her start this, discussed how this works, blood sugar targets and given patient information booklet on it  HYPOTHYROIDISM: Her TSH is consistently normal She will continue to take 112 mcg daily  LIPIDS: Currently followed by cardiology on low-dose Crestor Refuses to consider any injectable medication Discussed that her LDL needs to come down at least 40% and will not achieve this without additional medication    Follow-up in 6 months at her request  Patient Instructions  Check blood sugars on waking up 4 days a week  Also check blood sugars about 2 hours after meals and do this after different meals by rotation  Recommended blood sugar levels on waking up are 90-130 and about 2 hours after meal is 130-160  Please bring your blood sugar monitor to each visit, thank you     Reather Littler 12/11/2022, 8:39 PM   Note: This office note was prepared with Dragon voice recognition system technology. Any transcriptional errors that result from this process are unintentional.

## 2022-12-12 IMAGING — CT CT HEAD W/O CM
3 series · 15 of 47 positions shown, 18 images · non-contrast
Comparison: CT head 05/08/2021, brain MRI 05/10/2016

CLINICAL DATA: Diplopia



[Series 3: head 5.0 h30s · axial · 0.46mm/px · z∈[-68,+72]mm · 9 of 34 slices shown, 12 images]
[im 3/34  brain]
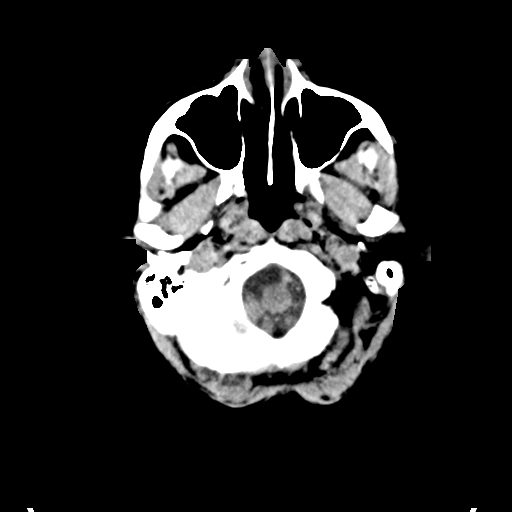
[im 3/34  bone]
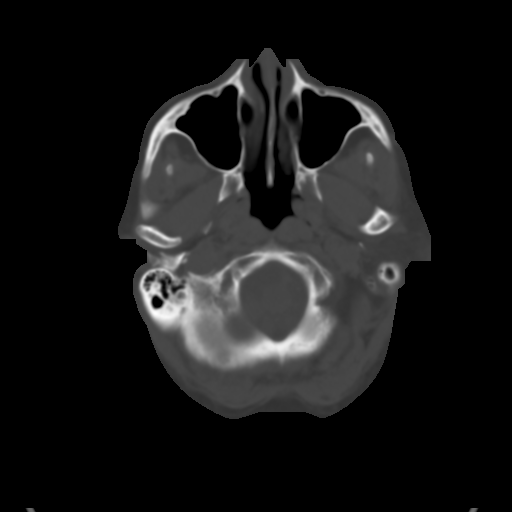
[im 6/34  brain]
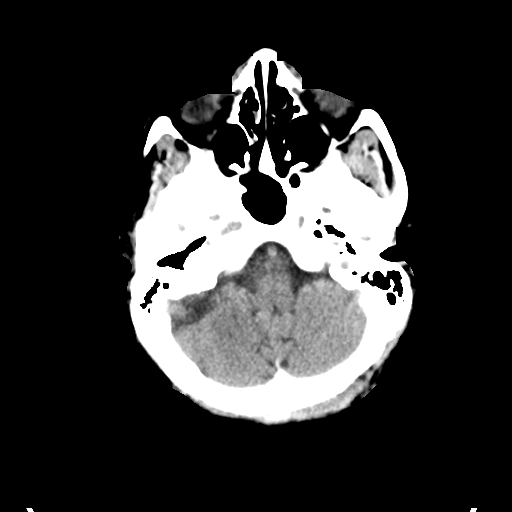
[im 10/34  brain]
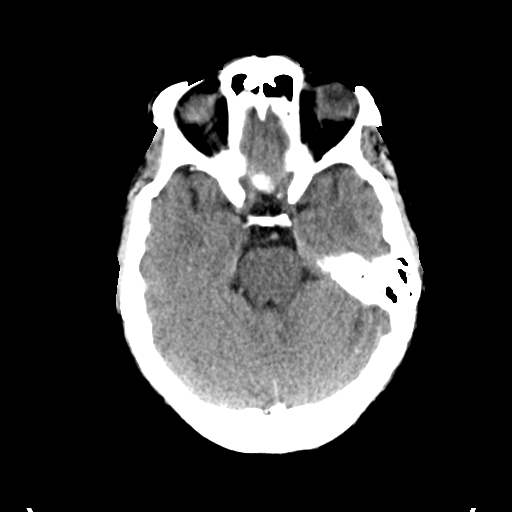
[im 13/34  brain]
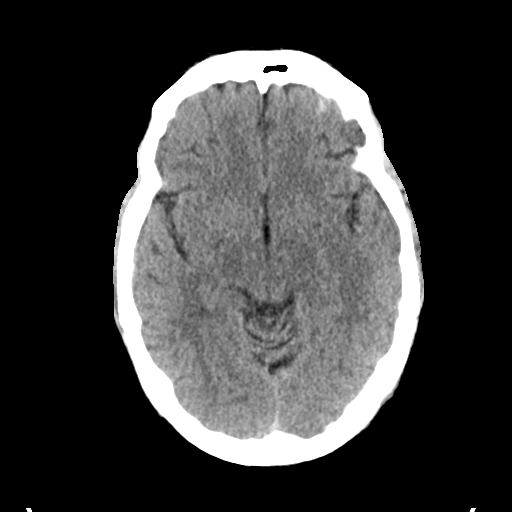
[im 18/34  brain]
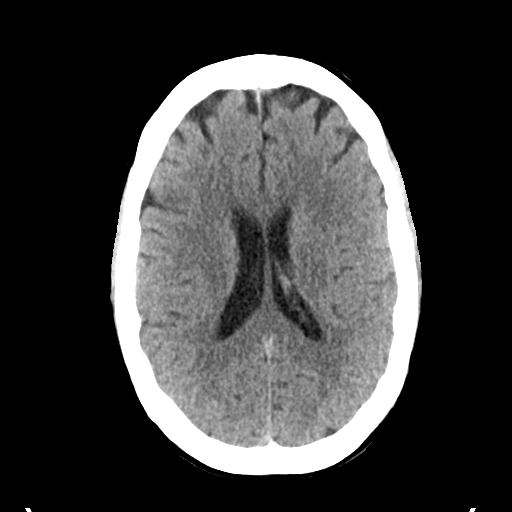
[im 18/34  bone]
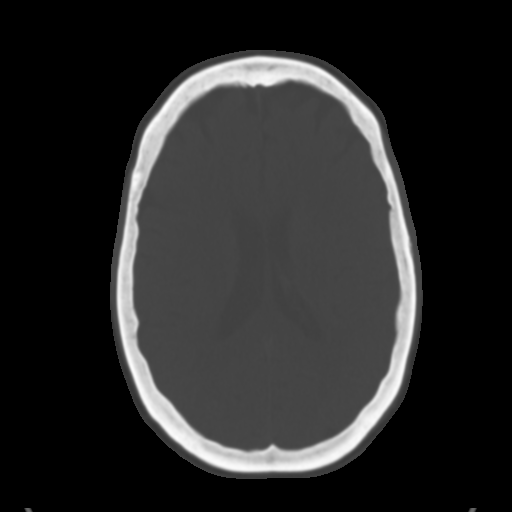
[im 21/34  brain]
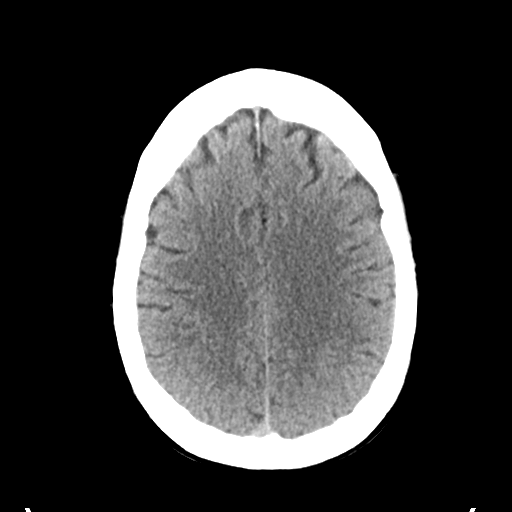
[im 24/34  brain]
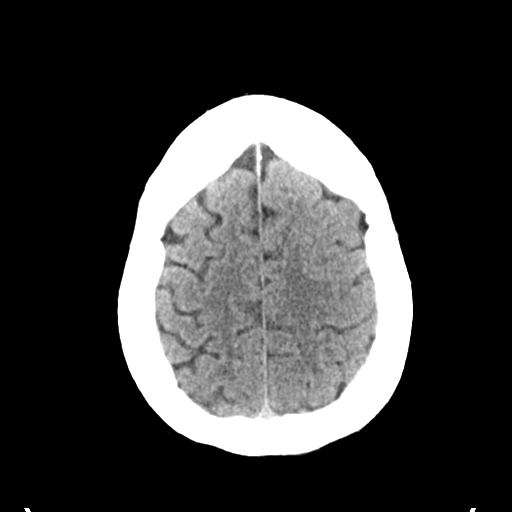
[im 28/34  brain]
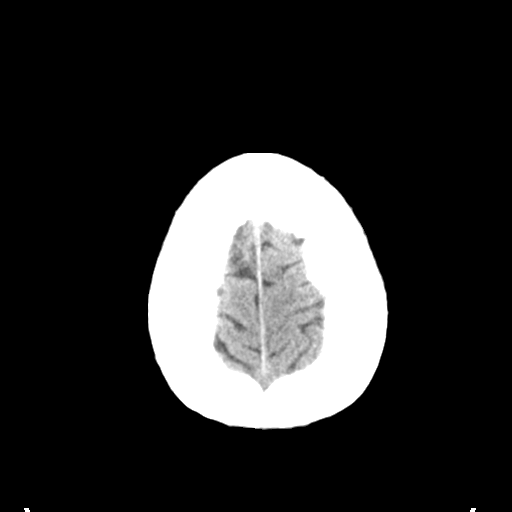
[im 31/34  brain]
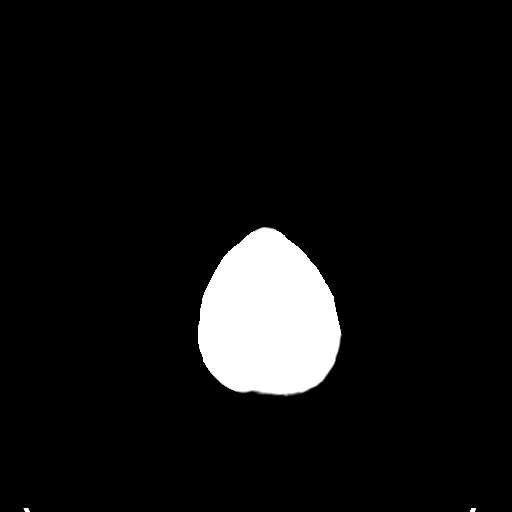
[im 31/34  bone]
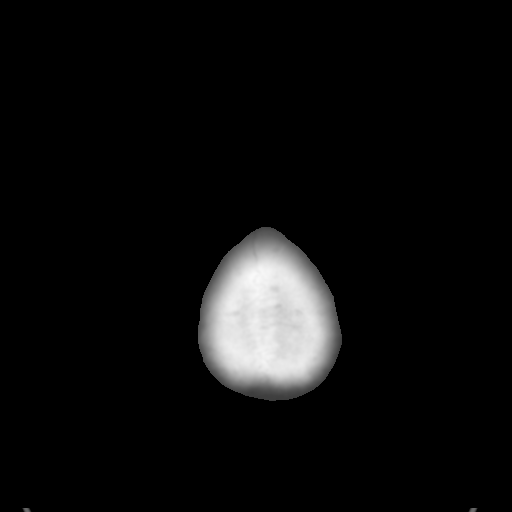

[Series 5: head 3.0 mpr cor · coronal · 0.35mm/px · 3 of 68 slices shown]
[im 23/68  brain]
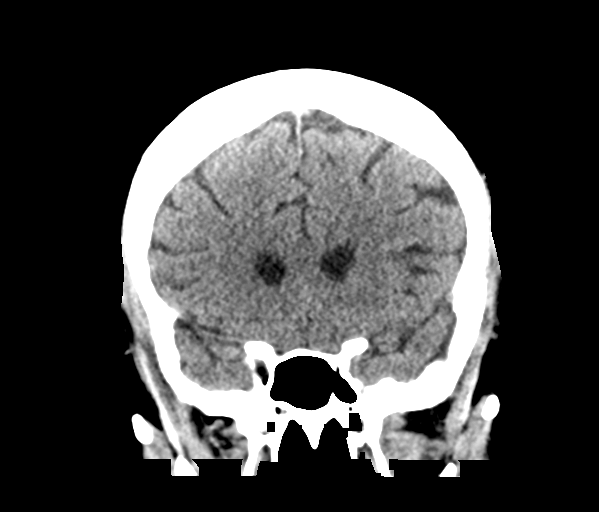
[im 30/68  brain]
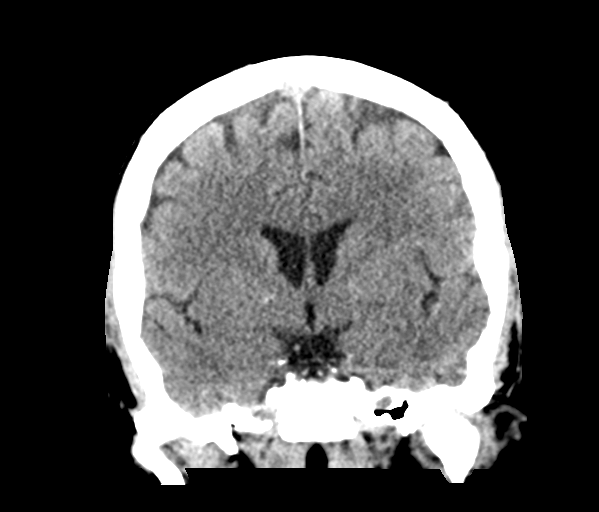
[im 38/68  brain]
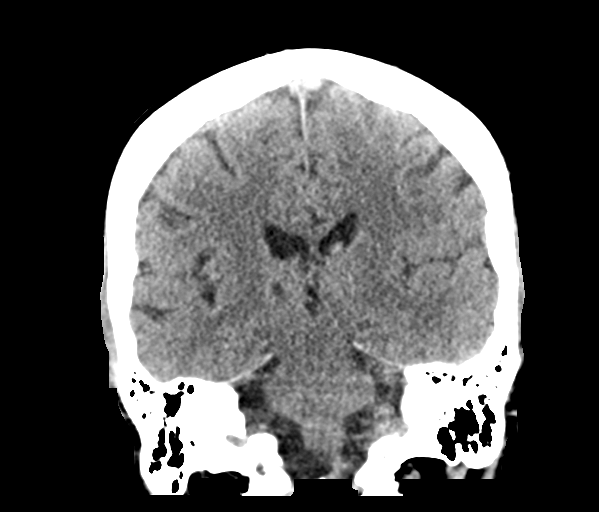

[Series 6: head 3.0 mpr sag · sagittal · 0.36mm/px · 3 of 55 slices shown]
[im 19/55  brain]
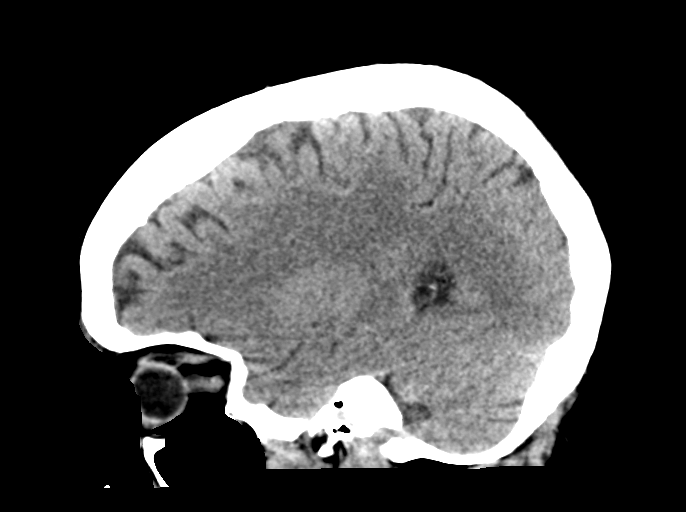
[im 28/55  brain]
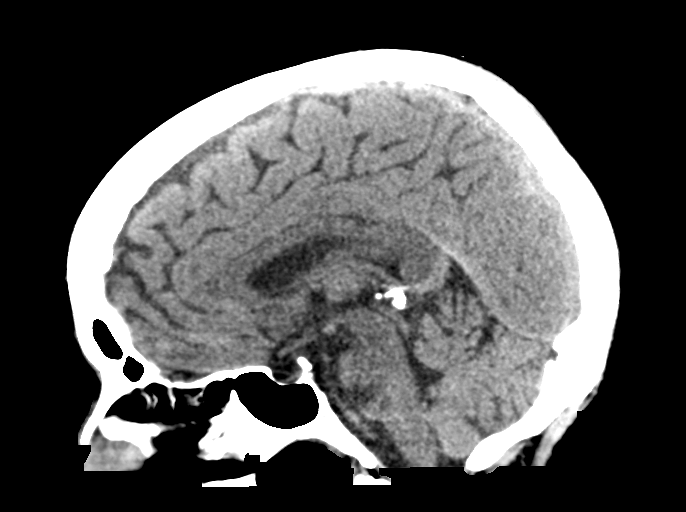
[im 37/55  brain]
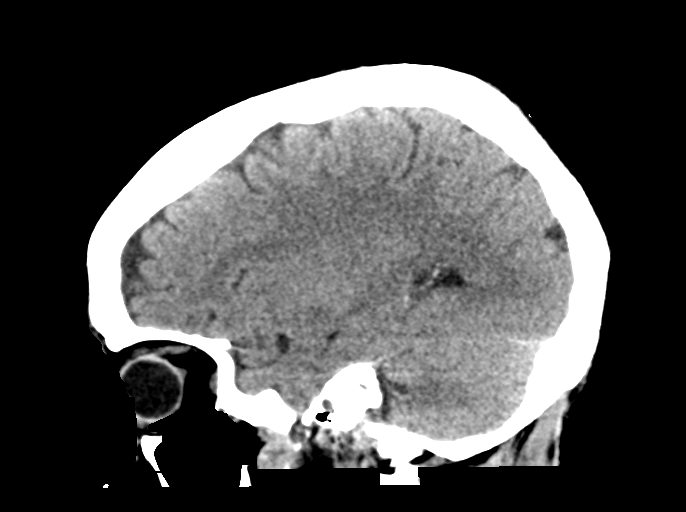

[15 of 47 positions shown; findings below may reference images not displayed]

FINDINGS: Brain: There is no evidence of acute intracranial hemorrhage,
extra-axial fluid collection, or acute infarct.

Parenchymal volume is normal. The ventricles are normal in size.
Gray-white differentiation is preserved. A remote lacunar infarct in
the right thalamus is unchanged.

There is no mass lesion.  There is no midline shift.

Vascular: There is calcification of the bilateral cavernous ICAs.

Skull: Normal. Negative for fracture or focal lesion.

Sinuses/Orbits: There is minimal mucosal thickening in the right
sphenoid sinus. Bilateral lens implants are in place. The globes and
orbits are otherwise unremarkable.

Other: None.
IMPRESSION: No acute intracranial pathology.

## 2022-12-14 ENCOUNTER — Other Ambulatory Visit: Payer: Self-pay | Admitting: Endocrinology

## 2022-12-21 ENCOUNTER — Encounter: Payer: Self-pay | Admitting: Endocrinology

## 2022-12-21 MED ORDER — FREESTYLE LIBRE 3 SENSOR MISC: 3 refills | Status: DC

## 2023-01-26 DIAGNOSIS — R350 Frequency of micturition: Secondary | ICD-10-CM | POA: Diagnosis not present

## 2023-01-26 DIAGNOSIS — N3941 Urge incontinence: Secondary | ICD-10-CM | POA: Diagnosis not present

## 2023-01-26 DIAGNOSIS — R351 Nocturia: Secondary | ICD-10-CM | POA: Diagnosis not present

## 2023-02-28 ENCOUNTER — Other Ambulatory Visit: Payer: Self-pay | Admitting: Cardiology

## 2023-03-06 ENCOUNTER — Other Ambulatory Visit: Payer: Self-pay | Admitting: Cardiology

## 2023-03-15 ENCOUNTER — Other Ambulatory Visit (INDEPENDENT_AMBULATORY_CARE_PROVIDER_SITE_OTHER): Payer: Medicare Other

## 2023-03-15 DIAGNOSIS — E1165 Type 2 diabetes mellitus with hyperglycemia: Secondary | ICD-10-CM | POA: Diagnosis not present

## 2023-03-15 LAB — BASIC METABOLIC PANEL
BUN: 17 mg/dL (ref 6–23)
CO2: 27 meq/L (ref 19–32)
Calcium: 10 mg/dL (ref 8.4–10.5)
Chloride: 102 meq/L (ref 96–112)
Creatinine, Ser: 0.62 mg/dL (ref 0.40–1.20)
GFR: 90.19 mL/min (ref >60.00)
Glucose, Bld: 188 mg/dL — ABNORMAL HIGH (ref 70–99)
Potassium: 4.5 meq/L (ref 3.5–5.1)
Sodium: 136 meq/L (ref 135–145)

## 2023-03-15 LAB — HEMOGLOBIN A1C: Hgb A1c MFr Bld: 7.9 % — ABNORMAL HIGH (ref 4.6–6.5)

## 2023-03-20 ENCOUNTER — Encounter: Payer: Self-pay | Admitting: Endocrinology

## 2023-03-20 ENCOUNTER — Ambulatory Visit (INDEPENDENT_AMBULATORY_CARE_PROVIDER_SITE_OTHER): Payer: Medicare Other | Admitting: Endocrinology

## 2023-03-20 VITALS — BP 138/80 | HR 83 | Resp 20 | Ht 63.5 in | Wt 165.6 lb

## 2023-03-20 DIAGNOSIS — E1165 Type 2 diabetes mellitus with hyperglycemia: Secondary | ICD-10-CM

## 2023-03-20 DIAGNOSIS — Z7984 Long term (current) use of oral hypoglycemic drugs: Secondary | ICD-10-CM

## 2023-03-20 DIAGNOSIS — E782 Mixed hyperlipidemia: Secondary | ICD-10-CM

## 2023-03-20 DIAGNOSIS — R350 Frequency of micturition: Secondary | ICD-10-CM | POA: Diagnosis not present

## 2023-03-20 DIAGNOSIS — E063 Autoimmune thyroiditis: Secondary | ICD-10-CM | POA: Diagnosis not present

## 2023-03-20 DIAGNOSIS — N3941 Urge incontinence: Secondary | ICD-10-CM | POA: Diagnosis not present

## 2023-03-20 NOTE — Patient Instructions (Signed)
Diabetes regimen: Metformin 1000 mg two times a day.  Increase glimepiride 2mg  daily.

## 2023-03-20 NOTE — Progress Notes (Signed)
Outpatient Endocrinology Note Joanna Sion Reinders, MD  03/20/23  Patient's Name: Joanna [name]    DOB: 1950/10/22    MRN: [id number]                                                    REASON OF VISIT: Follow up of type 2 diabetes mellitus /hypothyroidism  PCP: Pcp, No  HISTORY OF PRESENT ILLNESS:   Joanna [name] is a 72 y.o. old female with past medical history listed below, is here for follow up for type 2 diabetes mellitus /hypothyroidism.   Pertinent Diabetes History: Patient was diagnosed with type 2 diabetes mellitus around 2010.  She has not been on insulin therapy.  Chronic Diabetes Complications : Retinopathy: no. Last ophthalmology exam was done on annually, following with ophthalmology regularly.  Nephropathy: no, on ACE/ARB ramipril Peripheral neuropathy: no Coronary artery disease: no Stroke: no  Relevant comorbidities and cardiovascular risk factors: Obesity: no Body mass index is 28.87 kg/m.  Hypertension: Yes  Hyperlipidemia : Yes, on statin. - LIPIDS: she has long-standing severe hypercholesterolemia and has been intolerant to all statin drugs with various reactions. Lipitor caused flulike symptoms, abdominal bloating and hot flashes, pravastatin caused neck swelling and upper body pain, Zocor caused GI upset. Was not able to tolerate Zetia as she thinks it upset his stomach.   She did not want to continue Repatha because of cost   LDL particle number previously over 2500 with ideal levels of below 1000   She is followed by cardiology and has been only able to take 1/4 tablet of Crestor every other day   Again she thinks that she has GI side effects with taking it every day and takes it at bedtime   LDL is over the last couple of years gradually increasing, has had periodic follow-up with lipid clinic She has previously refused to consider injectable drugs including Leqvio   Also taking small doses of OTC fish oil  Current / Home Diabetic regimen  includes: Metformin 1000 mg 2 times a day. Amaryl /glimepiride 1 mg with supper daily.  Prior diabetic medications: Trulicity was stopped due to cost.  Greggory Keen could not continue due to cost as well.  Glycemic data:   Patient did not bring the freestyle libre 3 or the glucometer to download and review.  By recall she mentioned her blood sugar reading averages 160, early morning blood sugar is 140-1 60 and in the afternoon time it goes up to 120.  At night her blood sugar may go up to 200 and sometimes she will even have hypoglycemia.   Hypoglycemia: Patient has ? hypoglycemic episodes. Patient has hypoglycemia awareness.  Factors modifying glucose control: 1.  Diabetic diet assessment: 3 meals a day.  Avoiding sweets and watching portion.  2.  Staying active or exercising: No formal exercise.  3.  Medication compliance: compliant all of the time.  # She has OSA on CPAP.  # Primary hypothyroidism: -Patient previously had hide TSH of 9.2.  She has been on thyroid hormone replacements levothyroxine/Synthroid since the diagnosis.  She has been taking levothyroxine 112 mcg daily.  TSH has been consistently normal.  Interval history  Diabetes regimen as reviewed above.  Recent hemoglobin A1c worsening 7.9%.  Patient reports she was on vacation 3 times in between the visit and her eating was  not better.  She has been taking levothyroxine 112 mcg daily.  Denies hypo and hyperthyroid symptoms.  No other complaints today.  REVIEW OF SYSTEMS As per history of present illness.   PAST MEDICAL HISTORY: Past Medical History:  Diagnosis Date   Abnormal liver function tests    Adenomatous colon polyp    CAD (coronary artery disease) 05/09/2018   a. LHC 05/09/18: DES to mid/dist LAD, mod AS   Cerebrovascular disease    a. CT 04/2016 -calcific plaque at the origin LEFT vertebral contributing to severe stenosis.   Diverticulitis of colon    Fatty liver    a. mild fatty liver infiltration by  CT 2014.   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 Lynnette Caffey and Dr. Laneta Simmers   Statin intolerance    Stroke [facility])    a. right thalamic infarct in 04/2016 felt by neuro to be due to small vessel disease.   Type II diabetes mellitus (HCC)     PAST SURGICAL HISTORY: Past Surgical History:  Procedure Laterality Date   CARDIAC CATHETERIZATION     CATARACT EXTRACTION W/ INTRAOCULAR LENS  IMPLANT, BILATERAL Bilateral 08/2016   COLONOSCOPY W/ BIOPSIES AND POLYPECTOMY  05/19/2005   adenomatous polyps   CORONARY STENT INTERVENTION N/A 05/09/2018   Procedure: CORONARY STENT INTERVENTION;  Surgeon: Yvonne Kendall, MD;  Location: [facility];  Service: Cardiovascular;  Laterality: N/A;   INTRAOPERATIVE TRANSTHORACIC ECHOCARDIOGRAM N/A 06/21/2021   Procedure: INTRAOPERATIVE TRANSTHORACIC ECHOCARDIOGRAM;  Surgeon: Orbie Pyo, MD;  Location[facility];  Service: Open Heart Surgery;  Laterality: N/A;   LEFT HEART CATH AND CORONARY ANGIOGRAPHY N/A 05/09/2018   Procedure: LEFT HEART CATH AND CORONARY ANGIOGRAPHY;  Surgeon: Yvonne Kendall, MD;  Location: [facility];  Service: Cardiovascular;  Laterality: N/A;   MELANOMA EXCISION  1980s   mid upper stomach   NEPHROLITHOTOMY Left 01/27/2013   Procedure: NEPHROLITHOTOMY PERCUTANEOUS;  Surgeon: Crecencio Mc, MD;  Location: [facility] ORS;  Service: Urology;  Laterality: Left;   RIGHT/LEFT HEART CATH AND CORONARY ANGIOGRAPHY N/A 05/04/2021   Procedure: RIGHT/LEFT HEART CATH AND CORONARY ANGIOGRAPHY;  Surgeon: Orbie Pyo, MD;  Location: [facility];  Service: Cardiovascular;  Laterality: N/A;    TRANSCATHETER AORTIC VALVE REPLACEMENT, TRANSFEMORAL N/A 06/21/2021   Procedure: TRANSCATHETER AORTIC VALVE REPLACEMENT, TRANSFEMORAL USING A 23 MM EDWARDS SAPIEN 3 ULTRA  AORTIC VALVE;  Surgeon: Orbie Pyo, MD;  Location: [facility] OR;  Service: Open Heart Surgery;  Laterality: N/A;   TUBAL LIGATION     WISDOM TEETH EXTRACTED      ALLERGIES: Allergies  Allergen Reactions   Crestor [Rosuvastatin]     REACTION: N/V and heartburn, can very small dose    Lipitor [Atorvastatin]     REACTION: Hot flashes and flu-like symptoms   Pravastatin Sodium     REACTION: Neck swelling and pain in her shoulders and arms.   Sulfonamide Derivatives     Rxn Unknown   Zetia [Ezetimibe]     GI upset   Zocor [Simvastatin] Other (See Comments)  GI issues.. Pt unknown of severity   Niacin Nausea And Vomiting    FAMILY HISTORY:  Family History  Problem Relation Age of Onset   Hyperlipidemia Mother    Neurofibromatosis Father    Thyroid disease Daughter    Hyperlipidemia Sister    Hyperlipidemia Brother    Diabetes Neg Hx    Stroke Neg Hx    Colon cancer Neg Hx    Esophageal cancer Neg Hx    Pancreatic cancer Neg Hx    Liver cancer Neg Hx     SOCIAL HISTORY: Social History   Socioeconomic History   Marital status: Married    Spouse name: Not on file   Number of children: 3   Years of education: 13   Highest education level: High school graduate  Occupational History   Occupation: Producer, television/film/video: [employer]    Comment: sits most of day   Tobacco Use   Smoking status: Never   Smokeless tobacco: Never  Vaping Use   Vaping status: Never Used  Substance and Sexual Activity   Alcohol use: Never   Drug use: Never   Sexual activity: Not Currently  Other Topics Concern   Not on file  Social History Narrative   Married, 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 One caffeinated drink daily.Updated as of 04/30/2013   Social Determinants of Health   Financial Resource  Strain: Low Risk  (07/11/2018)   Overall Financial Resource Strain (CARDIA)    Difficulty of Paying Living Expenses: Not hard at all  Food Insecurity: No Food Insecurity (07/11/2018)   Hunger Vital Sign    Worried About Running Out of Food in the Last Year: Never true    Ran Out of Food in the Last Year: Never true  Transportation Needs: No Transportation Needs (07/11/2018)   PRAPARE - Administrator, Civil Service (Medical): No    Lack of Transportation (Non-Medical): No  Physical Activity: Sufficiently Active (07/11/2018)   Exercise Vital Sign    Days of Exercise per Week: 5 days    Minutes of Exercise per Session: 30 min  Stress: No Stress Concern Present (07/11/2018)   [employer] of Occupational Health - Occupational Stress Questionnaire    Feeling of Stress : Not at all  Social Connections: Not on file    MEDICATIONS:  Current Outpatient Medications  Medication Sig Dispense Refill   amLODipine (NORVASC) 2.5 MG tablet TAKE 1 TABLET BY MOUTH EVERY DAY 90 tablet 3   amoxicillin (AMOXIL) 500 MG tablet Take 4 tablets (2,000 mg total) by mouth as directed. 1 HOUR PRIOR TO DENTAL APPOINTMENTS 12 tablet 16   aspirin EC 81 MG tablet Take 1 tablet (81 mg total) by mouth daily. 30 tablet 0   Blood Glucose Monitoring Suppl (ONETOUCH VERIO) w/Device KIT by Does not apply route. Use to check blood sugars once daily.     carvedilol (COREG) 6.25 MG tablet TAKE 1 TABLET BY MOUTH TWICE A DAY 180 tablet 0   glimepiride (AMARYL) 1 MG tablet TAKE 1 TABLET BY MOUTH EVERYDAY AT BEDTIME 90 tablet 1   glucose blood (ONE TOUCH ULTRA TEST) test strip USE TO CHECK BLOOD SUGAR TWICE DAILY  Dx:E11.65 100 each 3   levothyroxine (SYNTHROID) 112 MCG tablet TAKE 1 TABLET BY MOUTH EVERY DAY 90 tablet 1   Magnesium 500 MG TABS Take 500 mg by mouth 2 (two) times daily.     metFORMIN (GLUCOPHAGE) 1000 MG tablet TAKE  1 TABLET BY MOUTH TWICE A DAY 180 tablet 1   nitroGLYCERIN (NITROSTAT) 0.4 MG SL  tablet Place 1 tablet (0.4 mg total) under the tongue every 5 (five) minutes x 3 doses as needed for chest pain. 25 tablet 12   OneTouch Delica Lancets 33G MISC by Does not apply route. Use to test blood sugars once daily     ramipril (ALTACE) 10 MG capsule TAKE 1 CAPSULE BY MOUTH EVERY DAY 90 capsule 0   rosuvastatin (CRESTOR) 5 MG tablet TAKE 1/2 TABLET BY MOUTH EVERY DAY 45 tablet 2   Continuous Glucose Sensor (FREESTYLE LIBRE 3 SENSOR) MISC Place 1 sensor on the skin every 14 days. Use to check glucose continuously (Patient not taking: Reported on 03/20/2023) 2 each 3   No current facility-administered medications for this visit.    PHYSICAL EXAM: Vitals:   03/20/23 1500 03/20/23 1501  BP: (!) 150/90 138/80  Pulse: 83   Resp: 20   SpO2: 96%   Weight: 165 lb 9.6 oz (75.1 kg)   Height: 5' 3.5" (1.613 m)    Body mass index is 28.87 kg/m.  Wt Readings from Last 3 Encounters:  03/20/23 165 lb 9.6 oz (75.1 kg)  12/11/22 164 lb 12.8 oz (74.8 kg)  06/15/22 162 lb 12.8 oz (73.8 kg)    General: Well developed, well nourished female in no apparent distress.  HEENT: AT/[place], no external lesions.  Eyes: Conjunctiva clear and no icterus. Neck: Neck supple  Lungs: Respirations not labored Neurologic: Alert, oriented, normal speech Extremities / Skin: Dry. No sores or rashes noted.  Psychiatric: Does not appear depressed or anxious  Diabetic Foot Exam - Simple   No data filed    LABS Reviewed Lab Results  Component Value Date   HGBA1C 7.9 (H) 03/15/2023   HGBA1C 7.7 Repeated and verified X2. (H) 12/06/2022   HGBA1C 7.0 (H) 06/13/2022   Lab Results  Component Value Date   FRUCTOSAMINE 262 08/12/2019   FRUCTOSAMINE 247 06/02/2016   FRUCTOSAMINE 274 (H) 07/16/2014   Lab Results  Component Value Date   CHOL 207 (H) 12/06/2022   HDL 42.70 12/06/2022   LDLCALC 128 (H) 12/06/2022   LDLDIRECT 209 (H) 03/19/2018   TRIG 184.0 (H) 12/06/2022   CHOLHDL 5 12/06/2022   Lab Results   Component Value Date   MICRALBCREAT 4.5 12/06/2022   MICRALBCREAT 6.7 12/01/2021   Lab Results  Component Value Date   CREATININE 0.62 03/15/2023   Lab Results  Component Value Date   GFR 90.19 03/15/2023    ASSESSMENT / PLAN  1. Uncontrolled type 2 diabetes mellitus with hyperglycemia, without long-term current use of insulin (HCC)   2. Acquired autoimmune hypothyroidism   3. Mixed hyperlipidemia     Diabetes Mellitus type 2, complicated by no known complications. - Diabetic status / severity: Uncontrolled.  Lab Results  Component Value Date   HGBA1C 7.9 (H) 03/15/2023    - Hemoglobin A1c goal : <7%  Recent worsening diabetes status.  She reports she was on vacation 3 times in the last few months.  Discussed about importance of controlling diabetes.  GLP-1 receptor agonist was not able to continue due to cost.  - Medications: See below.  I) continue metformin 1000 mg 2 times a day. II) increase glimepiride/Amaryl from 1 mg to 2 mg daily and advised to take in the morning with breakfast.  Patient reports hypoglycemia overnight following hyperglycemia, no records available to review. Advised to monitor blood sugar at home  and call our clinic if she develops hypoglycemia.  Patient freestyle libre CGM was not covered by insurance.  I provided Dexcom G7 2 samples in the clinic today.  - Home glucose testing: In the morning fasting and at bedtime.  And as needed.  Advised to bring glucometer in the clinic in follow-up visit. - Discussed/ Gave Hypoglycemia treatment plan.  # Consult : not required at this time.   # Annual urine for microalbuminuria/ creatinine ratio, no microalbuminuria currently, continue ACE/ARB /ramipril. Last  Lab Results  Component Value Date   MICRALBCREAT 4.5 12/06/2022    # Foot check nightly.  # Annual dilated diabetic eye exams.   - Diet: Make healthy diabetic food choices - Life style / activity / exercise: Discussed.  2. Blood  pressure  -  BP Readings from Last 1 Encounters:  03/20/23 138/80    - Control is in target.  - No change in current plans.  3. Lipid status / Hyperlipidemia - Last  Lab Results  Component Value Date   LDLCALC 128 (H) 12/06/2022   - Continue Crestor 5 mg half tablet daily.  Managed by cardiology.  # Primary hypothyroidism -Thyroid lab normal in July.  Continue current dose of levothyroxine 112 mcg daily. -Will check thyroid function test in 4 months.  Diagnoses and all orders for this visit:  Uncontrolled type 2 diabetes mellitus with hyperglycemia, without long-term current use of insulin (HCC) -     Basic metabolic panel; Future -     Hemoglobin A1c; Future  Acquired autoimmune hypothyroidism -     T4, free; Future -     TSH; Future  Mixed hyperlipidemia    DISPOSITION Follow up in clinic in 4 months suggested.   All questions answered and patient verbalized understanding of the plan.  Joanna Masiyah Jorstad, MD [facility] Endocrinology [facility] [address] [place], [address] [place], [place] [address] Phone # [phone]  At least part of this note was generated using voice recognition software. Inadvertent word errors may have occurred, which were not recognized during the proofreading process.

## 2023-06-18 ENCOUNTER — Other Ambulatory Visit: Payer: Self-pay | Admitting: Cardiology

## 2023-06-18 ENCOUNTER — Telehealth: Payer: Self-pay | Admitting: Cardiology

## 2023-06-18 MED ORDER — AMLODIPINE BESYLATE 2.5 MG PO TABS: 2.5000 mg | ORAL_TABLET | Freq: Every day | ORAL | 0 refills | Status: DC

## 2023-06-18 MED ORDER — CARVEDILOL 6.25 MG PO TABS: 6.2500 mg | ORAL_TABLET | Freq: Two times a day (BID) | ORAL | 0 refills | Status: DC

## 2023-06-18 NOTE — Telephone Encounter (Signed)
*  STAT* If patient is at the pharmacy, call can be transferred to refill team.   1. Which medications need to be refilled? (please list name of each medication and dose if known)  amLODipine (NORVASC) 2.5 MG tablet  carvedilol (COREG) 6.25 MG tablet  2. Would you like to learn more about the convenience, safety, & potential cost savings by using the [facility]? no   3. Are you open to using the [facility] (Type [facility]. no  4. Which pharmacy/location (including street and city if local pharmacy) is medication to be sent to? [id number], [place] - [address] Phone: [phone]  Fax: [phone]       5. Do they need a 30 day or 90 day supply? 30

## 2023-06-18 NOTE — Telephone Encounter (Signed)
Pt's medications were sent to pt's pharmacy as requested. Confirmation received.

## 2023-06-19 ENCOUNTER — Other Ambulatory Visit: Payer: Self-pay

## 2023-06-19 DIAGNOSIS — E1165 Type 2 diabetes mellitus with hyperglycemia: Secondary | ICD-10-CM

## 2023-06-19 DIAGNOSIS — E063 Autoimmune thyroiditis: Secondary | ICD-10-CM

## 2023-06-19 MED ORDER — ONETOUCH DELICA LANCETS 33G MISC: 1 refills | Status: AC

## 2023-06-19 MED ORDER — GLUCOSE BLOOD VI STRP: ORAL_STRIP | 3 refills | Status: DC

## 2023-06-19 MED ORDER — LEVOTHYROXINE SODIUM 112 MCG PO TABS: 112.0000 ug | ORAL_TABLET | Freq: Every day | ORAL | 1 refills | Status: DC

## 2023-06-19 MED ORDER — METFORMIN HCL 1000 MG PO TABS: ORAL_TABLET | ORAL | 1 refills | Status: DC

## 2023-06-19 MED ORDER — GLIMEPIRIDE 1 MG PO TABS: ORAL_TABLET | ORAL | 1 refills | Status: DC

## 2023-06-20 MED ORDER — RAMIPRIL 10 MG PO CAPS: 10.0000 mg | ORAL_CAPSULE | Freq: Every day | ORAL | 0 refills | Status: DC

## 2023-06-22 ENCOUNTER — Telehealth: Payer: Self-pay

## 2023-06-22 ENCOUNTER — Other Ambulatory Visit: Payer: Self-pay

## 2023-06-22 DIAGNOSIS — E1165 Type 2 diabetes mellitus with hyperglycemia: Secondary | ICD-10-CM

## 2023-06-22 MED ORDER — GLUCOSE BLOOD VI STRP: ORAL_STRIP | 3 refills | Status: AC

## 2023-06-22 NOTE — Telephone Encounter (Signed)
Okay to change to once a day.

## 2023-06-22 NOTE — Telephone Encounter (Signed)
Per chart notes and RX advised to check blood glucose BID (fasting and bedtime)  and PRN, however per pharmacy insurance won't cover checking more than once if patient does not use insulin.

## 2023-06-25 NOTE — Progress Notes (Signed)
 Cardiology Office Note    Patient Name: Joanna [name] Date of Encounter: 06/25/2023  Primary Care Provider:  Pcp, No Primary Cardiologist:  Oneil Parchment, MD Primary Electrophysiologist: None   Past Medical History    Past Medical History:  Diagnosis Date   Abnormal liver function tests    Adenomatous colon polyp    CAD (coronary artery disease) 05/09/2018   a. LHC 05/09/18: DES to mid/dist LAD, mod AS   Cerebrovascular disease    a. CT 04/2016 -calcific plaque at the origin LEFT vertebral contributing to severe stenosis.   Diverticulitis of colon    Fatty liver    a. mild fatty liver infiltration by CT 2014.   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 Wendel and Dr. Lucas   Statin intolerance    Stroke [facility])    a. right thalamic infarct in 04/2016 felt by neuro to be due to small vessel disease.   Type II diabetes mellitus (HCC)     History of Present Illness  Joanna [name] is a 73 y.o. female with a PMH of CAD s/p DES to mLAD (2019), and severe AS s/p TAVR (06/21/21), hypothyroidism, CVA s/p right thalamic infarct 2017, HTN,HLD,OSA on CPAP, who presents today for 1 year follow-up.  Joanna [name] was seen by Dr. Lavona in 2010 for abnormal lipids and is currently followed by Dr. Parchment.  She suffered an acute right thalamic infarct in 2017 and was treated with daily ASA and followed by neurology.  She was seen in follow-up by [name], PA in 2019 and endorsed left arm discomfort and completed a cardiac CT that was abnormal disease in ostial  RCA(50-69%)and mLAD with FFR showing significant stenosis in mid LAD and recommended LHC.  TTE was  completed showing EF of 65- 70 % with grade 1 DD, moderate AS, mild LAE.  LHC was completed showing single-vessel CAD with 90% mid/distal LAD stenosis treated with PCI and DES to LAD. She was also found to have moderate aortic stenosis and was started on coreg  and DAPT with ASA and Brilinta .  She did well post PCI with controlled BP and 2D echo was repeated 08/2020 showing peak velocity of 3.9 m/s and mean gradient of 36.3 mmHg.  2D echo was repeated on 03/2021 showing an increased mean gradient across aortic valve of 54 mmHg. She was referred to the multidisciplinary team and underwent LHC on 05/04/2022 that showed stable one-vessel disease in the LAD with mild to moderate disease elsewhere.  Due to her symptomatic aortic stenosis she was deemed suitable for TAVR. She ultimately underwent  successful TAVR with a 23 mm Edwards Sapien 3SU THV via the TF approach on 06/21/21. Post operative echo with well seated bioprosthetic aortic valve.  She developed diplopia with dizziness with ambulation postprocedure.  She underwent a head CT due to history of Thalamic CVA  on 06/22/2021 that showed no acute findings.  Neurology was consulted with brain MRI showing no acute intracranial process was started on Plavix  75 mg daily along with ASA 81 mg.  She  was last seen by Dr. Jeffrie on 12/01/2021 reported doing well but still endorsed intermittent left upper extremity weakness in onset.  She also endorsed mild swelling in her ankles.  Her amlodipine  was decreased to 2.5 mg once a day and she was referred to the lipid clinic to discuss alternative agents but refused to PCSK9 therapy.  She was last seen by [name] on 06/02/2022 and was staying active with only complaint being left arm heaviness patient was a residual issue since her stroke.  She completed 2D echo for surveillance on 05/2022 that showed stable TAVR function with mean gradient of 10 mmHg.  She also noted blurry vision when she gets very tired.  She is currently  followed by endocrinology for management of hypothyroidism and type 2 diabetes.  Joanna [name] presents today for follow-up.  During today's visit she presents with a chief complaint of increasing heaviness and fatigue in the left arm. The patient describes the sensation as a tightening that extends down the arm and into the hand, which also experiences numbness. The patient reports occasional swelling but denies any pain. The patient notes that the symptoms can be sporadic, with some days feeling completely normal and others feeling extremely fatigued. The patient has noticed that stress seems to exacerbate the symptoms.  The patient also has a history of high cholesterol and has been prescribed statins in the past. However, the patient experienced significant side effects, including weight gain and facial swelling, leading to discontinuation of the medication. The patient is currently taking a low dose of cholesterol medication every other day and reports no adverse effects. The patient also takes a daily baby aspirin  due to a history of stroke and coronary disease.  Patient denies chest pain, palpitations, dyspnea, PND, orthopnea, nausea, vomiting, dizziness, syncope, edema, weight gain, or early satiety.   Review of Systems  Please see the history of present illness.    All other systems reviewed and are otherwise negative except as noted above.  Physical Exam    Wt Readings from Last 3 Encounters:  03/20/23 165 lb 9.6 oz (75.1 kg)  12/11/22 164 lb 12.8 oz (74.8 kg)  06/15/22 162 lb 12.8 oz (73.8 kg)   CD:Uyzmz were no vitals filed for this visit.,There is no height or weight on file to calculate BMI. GEN: Well nourished, well developed in no acute distress Neck: No JVD; No carotid bruits Pulmonary: Clear to auscultation without rales, wheezing or rhonchi  Cardiovascular: Normal rate. Regular rhythm. Normal S1. Normal S2.   Murmurs: There is no murmur.  ABDOMEN: Soft, non-tender,  non-distended EXTREMITIES:  No edema; No deformity   EKG/LABS/ Recent Cardiac Studies   ECG personally reviewed by me today -sinus rhythm with first-degree AVB with no acute changes and rate of 63 BPM  Risk Assessment/Calculations:          Lab Results  Component Value Date   WBC 7.6 06/23/2021   HGB 12.4 06/23/2021   HCT 36.7 06/23/2021   MCV 87.2 06/23/2021   PLT 170 06/23/2021   Lab Results  Component Value Date   CREATININE 0.62 03/15/2023   BUN 17 03/15/2023   NA 136 03/15/2023   K 4.5 03/15/2023   CL 102 03/15/2023   CO2 27 03/15/2023   Lab Results  Component Value Date   CHOL 207 (H) 12/06/2022   HDL 42.70 12/06/2022   LDLCALC 128 (H) 12/06/2022   LDLDIRECT 209 (H) 03/19/2018   TRIG 184.0 (H) 12/06/2022   CHOLHDL 5  12/06/2022    Lab Results  Component Value Date   HGBA1C 7.9 (H) 03/15/2023   Assessment & Plan    1.  Severe aortic stenosis: -s/p TAVR completed 05/2021 for severe aortic stenosis. -SBE prophylaxis discussed -Reports no new complaints shortness of breath with exertion. -Repeat 2D echo for surveillance -Continue ASA 81 mg  2.  Coronary artery disease: -s/p DES to mid LAD/distal LAD 2019 - Today reports no CP or SOB -She is staying active playing pickle ball -Continue GDMT with ASA 81 mg, Crestor  5 mg QOD and Coreg  6.25 mg BID  3.  Essential hypertension: -Patient's blood pressure today was stable at 122/74 -Continue carvedilol  6.25 mg twice daily, Ramipril  10 mg qd and Norvasc  2.5 mg daily  4.  History of CVA: -Thalamic stroke 2017 with left sided residual weakness. -Continue ASA 81 mg and Crestor  5 mg QOD  5.  Hyperlipidemia: -Patient's LDL cholesterol was 128 History of intolerance to multiple statins (Crestor , Lipitor) with adverse effects including weight gain, facial swelling, and flu-like symptoms. Currently on low-dose statin every other day. Last LDL was elevated. -Check lipid panel today. -Discuss potential use of Nexlizet  depending on lipid panel results.  6. Left Arm Heaviness Residual symptom from previous stroke, worsening over time. Noted numbness in hand and heaviness in arm, particularly during periods of stress. No associated leg symptoms. -Order duplex ultrasound of the arm to rule out stenosis or blood clot.  Disposition: Follow-up with Oneil Parchment, MD or APP in 12 months   Signed, Wyn Raddle, Jackee Shove, NP 06/25/2023, 8:08 AM [facility]

## 2023-06-26 ENCOUNTER — Ambulatory Visit: Payer: Medicare Other | Attending: Nurse Practitioner | Admitting: Nurse Practitioner

## 2023-06-26 ENCOUNTER — Encounter: Payer: Self-pay | Admitting: Nurse Practitioner

## 2023-06-26 VITALS — BP 140/80 | HR 63 | Ht 63.5 in | Wt 167.0 lb

## 2023-06-26 DIAGNOSIS — I742 Embolism and thrombosis of arteries of the upper extremities: Secondary | ICD-10-CM

## 2023-06-26 DIAGNOSIS — I2510 Atherosclerotic heart disease of native coronary artery without angina pectoris: Secondary | ICD-10-CM

## 2023-06-26 DIAGNOSIS — Z8673 Personal history of transient ischemic attack (TIA), and cerebral infarction without residual deficits: Secondary | ICD-10-CM | POA: Diagnosis present

## 2023-06-26 DIAGNOSIS — I10 Essential (primary) hypertension: Secondary | ICD-10-CM

## 2023-06-26 DIAGNOSIS — I350 Nonrheumatic aortic (valve) stenosis: Secondary | ICD-10-CM

## 2023-06-26 DIAGNOSIS — E785 Hyperlipidemia, unspecified: Secondary | ICD-10-CM

## 2023-06-26 MED ORDER — CARVEDILOL 6.25 MG PO TABS: 6.2500 mg | ORAL_TABLET | Freq: Two times a day (BID) | ORAL | 3 refills | Status: DC

## 2023-06-26 MED ORDER — ROSUVASTATIN CALCIUM 5 MG PO TABS: 2.5000 mg | ORAL_TABLET | Freq: Every day | ORAL | 2 refills | Status: DC

## 2023-06-26 MED ORDER — RAMIPRIL 10 MG PO CAPS: 10.0000 mg | ORAL_CAPSULE | Freq: Every day | ORAL | 3 refills | Status: DC

## 2023-06-26 MED ORDER — AMLODIPINE BESYLATE 2.5 MG PO TABS: 2.5000 mg | ORAL_TABLET | Freq: Every day | ORAL | 3 refills | Status: AC

## 2023-06-26 NOTE — Patient Instructions (Signed)
Medication Instructions:   Your physician recommends that you continue on your current medications as directed. Please refer to the Current Medication list given to you today.   *If you need a refill on your cardiac medications before your next appointment, please call your pharmacy*   Lab Work:  TODAY!!!! LFT/LIPID!!!  If you have labs (blood work) drawn today and your tests are completely normal, you will receive your results only by: MyChart Message (if you have MyChart) OR A paper copy in the mail If you have any lab test that is abnormal or we need to change your treatment, we will call you to review the results.   Testing/Procedures:  Your physician has requested that you have an echocardiogram. Echocardiography is a painless test that uses sound waves to create images of your heart. It provides your doctor with information about the size and shape of your heart and how well your heart's chambers and valves are working. This procedure takes approximately one hour. There are no restrictions for this procedure. Please do NOT wear cologne, perfume,  or lotions (deodorant is allowed). Please arrive 15 minutes prior to your appointment time.  Please note: We ask at that you not bring children with you during ultrasound (echo/ vascular) testing. Due to room size and safety concerns, children are not allowed in the ultrasound rooms during exams. Our front office staff cannot provide observation of children in our lobby area while testing is being conducted. An adult accompanying a patient to their appointment will only be allowed in the ultrasound room at the discretion of the ultrasound technician under special circumstances. We apologize for any inconvenience.  Your physician has requested that you have a lower or upper extremity venous duplex. This test is an ultrasound of the veins in the legs or arms. It looks at venous blood flow that carries blood from the heart to the legs or arms.  Allow one hour for a Lower Venous exam. Allow thirty minutes for an Upper Venous exam. There are no restrictions or special instructions.  Please note: We ask at that you not bring children with you during ultrasound (echo/ vascular) testing. Due to room size and safety concerns, children are not allowed in the ultrasound rooms during exams. Our front office staff cannot provide observation of children in our lobby area while testing is being conducted. An adult accompanying a patient to their appointment will only be allowed in the ultrasound room at the discretion of the ultrasound technician under special circumstances. We apologize for any inconvenience.    Follow-Up: At [facility], you and your health needs are our priority.  As part of our continuing mission to provide you with exceptional heart care, we have created designated Provider Care Teams.  These Care Teams include your primary Cardiologist (physician) and Advanced Practice Providers (APPs -  Physician Assistants and Nurse Practitioners) who all work together to provide you with the care you need, when you need it.  We recommend signing up for the patient portal called "MyChart".  Sign up information is provided on this After Visit Summary.  MyChart is used to connect with patients for Virtual Visits (Telemedicine).  Patients are able to view lab/test results, encounter notes, upcoming appointments, etc.  Non-urgent messages can be sent to your provider as well.   To learn more about what you can do with MyChart, go to [email or web address].    Your next appointment:   1 year(s)  Provider:   Donato Schultz, MD  Other Instructions  Your physician wants you to follow-up in: 1 year.  You will receive a reminder letter in the mail two months in advance. If you don't receive a letter, please call our office to schedule the follow-up appointment.    1st Floor: - Lobby - Registration  - Pharmacy  - Lab - Cafe  2nd  Floor: - PV Lab - Diagnostic Testing (echo, CT, nuclear med)  3rd Floor: - Vacant  4th Floor: - TCTS (cardiothoracic surgery) - AFib Clinic - Structural Heart Clinic - Vascular Surgery  - Vascular Ultrasound  5th Floor: - HeartCare Cardiology (general and EP) - Clinical Pharmacy for coumadin, hypertension, lipid, weight-loss medications, and med management appointments    Valet parking services will be available as well.    DASH Eating Plan DASH stands for Dietary Approaches to Stop Hypertension. The DASH eating plan is a healthy eating plan that has been shown to: Lower high blood pressure (hypertension). Reduce your risk for type 2 diabetes, heart disease, and stroke. Help with weight loss. What are tips for following this plan? Reading food labels Check food labels for the amount of salt (sodium) per serving. Choose foods with less than 5 percent of the Daily Value (DV) of sodium. In general, foods with less than 300 milligrams (mg) of sodium per serving fit into this eating plan. To find whole grains, look for the word "whole" as the first word in the ingredient list. Shopping Buy products labeled as "low-sodium" or "no salt added." Buy fresh foods. Avoid canned foods and pre-made or frozen meals. Cooking Try not to add salt when you cook. Use salt-free seasonings or herbs instead of table salt or sea salt. Check with your health care provider or pharmacist before using salt substitutes. Do not fry foods. Cook foods in healthy ways, such as baking, boiling, grilling, roasting, or broiling. Cook using oils that are good for your heart. These include olive, canola, avocado, soybean, and sunflower oil. Meal planning  Eat a balanced diet. This should include: 4 or more servings of fruits and 4 or more servings of vegetables each day. Try to fill half of your plate with fruits and vegetables. 6-8 servings of whole grains each day. 6 or less servings of lean meat, poultry, or  fish each day. 1 oz is 1 serving. A 3 oz (85 g) serving of meat is about the same size as the palm of your hand. One egg is 1 oz (28 g). 2-3 servings of low-fat dairy each day. One serving is 1 cup (237 mL). 1 serving of nuts, seeds, or beans 5 times each week. 2-3 servings of heart-healthy fats. Healthy fats called omega-3 fatty acids are found in foods such as walnuts, flaxseeds, fortified milks, and eggs. These fats are also found in cold-water fish, such as sardines, salmon, and mackerel. Limit how much you eat of: Canned or prepackaged foods. Food that is high in trans fat, such as fried foods. Food that is high in saturated fat, such as fatty meat. Desserts and other sweets, sugary drinks, and other foods with added sugar. Full-fat dairy products. Do not salt foods before eating. Do not eat more than 4 egg yolks a week. Try to eat at least 2 vegetarian meals a week. Eat more home-cooked food and less restaurant, buffet, and fast food. Lifestyle When eating at a restaurant, ask if your food can be made with less salt or no salt. If you drink alcohol: Limit how much you have to:  0-1 drink a day if you are female. 0-2 drinks a day if you are female. Know how much alcohol is in your drink. In the [place] one drink is one 12 oz bottle of beer (355 mL), one 5 oz glass of wine (148 mL), or one 1 oz glass of hard liquor (44 mL). General information Avoid eating more than 2,300 mg of salt a day. If you have hypertension, you may need to reduce your sodium intake to 1,500 mg a day. Work with your provider to stay at a healthy body weight or lose weight. Ask what the best weight range is for you. On most days of the week, get at least 30 minutes of exercise that causes your heart to beat faster. This may include walking, swimming, or biking. Work with your provider or dietitian to adjust your eating plan to meet your specific calorie needs. What foods should I eat? Fruits All fresh, dried, or  frozen fruit. Canned fruits that are in their natural juice and do not have sugar added to them. Vegetables Fresh or frozen vegetables that are raw, steamed, roasted, or grilled. Low-sodium or reduced-sodium tomato and vegetable juice. Low-sodium or reduced-sodium tomato sauce and tomato paste. Low-sodium or reduced-sodium canned vegetables. Grains Whole-grain or whole-wheat bread. Whole-grain or whole-wheat pasta. Brown rice. Orpah Cobb. Bulgur. Whole-grain and low-sodium cereals. Pita bread. Low-fat, low-sodium crackers. Whole-wheat flour tortillas. Meats and other proteins Skinless chicken or [place]. Ground chicken or [place]. Pork with fat trimmed off. Fish and seafood. Egg whites. Dried beans, peas, or lentils. Unsalted nuts, nut butters, and seeds. Unsalted canned beans. Lean cuts of beef with fat trimmed off. Low-sodium, lean precooked or cured meat, such as sausages or meat loaves. Dairy Low-fat (1%) or fat-free (skim) milk. Reduced-fat, low-fat, or fat-free cheeses. Nonfat, low-sodium ricotta or cottage cheese. Low-fat or nonfat yogurt. Low-fat, low-sodium cheese. Fats and oils Soft margarine without trans fats. Vegetable oil. Reduced-fat, low-fat, or light mayonnaise and salad dressings (reduced-sodium). Canola, safflower, olive, avocado, soybean, and sunflower oils. Avocado. Seasonings and condiments Herbs. Spices. Seasoning mixes without salt. Other foods Unsalted popcorn and pretzels. Fat-free sweets. The items listed above may not be all the foods and drinks you can have. Talk to a dietitian to learn more. What foods should I avoid? Fruits Canned fruit in a light or heavy syrup. Fried fruit. Fruit in cream or butter sauce. Vegetables Creamed or fried vegetables. Vegetables in a cheese sauce. Regular canned vegetables that are not marked as low-sodium or reduced-sodium. Regular canned tomato sauce and paste that are not marked as low-sodium or reduced-sodium. Regular tomato and  vegetable juices that are not marked as low-sodium or reduced-sodium. Rosita Fire. Olives. Grains Baked goods made with fat, such as croissants, muffins, or some breads. Dry pasta or rice meal packs. Meats and other proteins Fatty cuts of meat. Ribs. Fried meat. Tomasa Blase. Bologna, salami, and other precooked or cured meats, such as sausages or meat loaves, that are not lean and low in sodium. Fat from the back of a pig (fatback). Bratwurst. Salted nuts and seeds. Canned beans with added salt. Canned or smoked fish. Whole eggs or egg yolks. Chicken or [place] with skin. Dairy Whole or 2% milk, cream, and half-and-half. Whole or full-fat cream cheese. Whole-fat or sweetened yogurt. Full-fat cheese. Nondairy creamers. Whipped toppings. Processed cheese and cheese spreads. Fats and oils Butter. Stick margarine. Lard. Shortening. Ghee. Bacon fat. Tropical oils, such as coconut, palm kernel, or palm oil. Seasonings and condiments Onion salt, garlic  salt, seasoned salt, table salt, and sea salt. Worcestershire sauce. Tartar sauce. Barbecue sauce. Teriyaki sauce. Soy sauce, including reduced-sodium soy sauce. Steak sauce. Canned and packaged gravies. Fish sauce. Oyster sauce. Cocktail sauce. Store-bought horseradish. Ketchup. Mustard. Meat flavorings and tenderizers. Bouillon cubes. Hot sauces. Pre-made or packaged marinades. Pre-made or packaged taco seasonings. Relishes. Regular salad dressings. Other foods Salted popcorn and pretzels. The items listed above may not be all the foods and drinks you should avoid. Talk to a dietitian to learn more. Where to find more information National Heart, Lung, and Blood Institute (NHLBI): BuffaloDryCleaner.gl American Heart Association (AHA): heart.org Academy of Nutrition and Dietetics: eatright.org National Kidney Foundation (NKF): kidney.org This information is not intended to replace advice given to you by your health care provider. Make sure you discuss any questions you have  with your health care provider. Document Revised: 05/25/2022 Document Reviewed: 05/25/2022 Elsevier Patient Education  2024 Elsevier Inc.  Adopting a Healthy Lifestyle.   Weight: Know what a healthy weight is for you (roughly BMI <25) and aim to maintain this. You can calculate your body mass index on your smart phone. Unfortunately, this is not the most accurate measure of healthy weight, but it is the simplest measurement to use. A more accurate measurement involves body scanning which measures lean muscle, fat tissue and bony density. We do not have this equipment at [facility].    Diet: Aim for 7+ servings of fruits and vegetables daily Limit animal fats in diet for cholesterol and heart health - choose grass fed whenever available Avoid highly processed foods (fast food burgers, tacos, fried chicken, pizza, hot dogs, french fries)  Saturated fat comes in the form of butter, lard, coconut oil, margarine, partially hydrogenated oils, and fat in meat. These increase your risk of cardiovascular disease.  Use healthy plant oils, such as olive, canola, soy, corn, sunflower and peanut.  Whole foods such as fruits, vegetables and whole grains have fiber  Men need > 38 grams of fiber per day Women need > 25 grams of fiber per day  Load up on vegetables and fruits - one-half of your plate: Aim for color and variety, and remember that potatoes dont count. Go for whole grains - one-quarter of your plate: Whole wheat, barley, wheat berries, quinoa, oats, brown rice, and foods made with them. If you want pasta, go with whole wheat pasta. Protein power - one-quarter of your plate: Fish, chicken, beans, and nuts are all healthy, versatile protein sources. Limit red meat. You need carbohydrates for energy! The type of carbohydrate is more important than the amount. Choose carbohydrates such as vegetables, fruits, whole grains, beans, and nuts in the place of white rice, white pasta, potatoes (baked or  fried), macaroni and cheese, cakes, cookies, and donuts.  If youre thirsty, drink water. Coffee and tea are good in moderation, but skip sugary drinks and limit milk and dairy products to one or two daily servings. Keep sugar intake at 6 teaspoons or 24 grams or LESS       Exercise: Aim for 150 min of moderate intensity exercise weekly for heart health, and weights twice weekly for bone health Stay active - any steps are better than no steps! Aim for 7-9 hours of sleep daily

## 2023-06-27 ENCOUNTER — Ambulatory Visit (HOSPITAL_COMMUNITY)
Admission: RE | Admit: 2023-06-27 | Discharge: 2023-06-27 | Disposition: A | Discharge status: Home / Self Care | Payer: Medicare Other | Source: Ambulatory Visit | Attending: Cardiology | Admitting: Cardiology

## 2023-06-27 ENCOUNTER — Telehealth: Payer: Self-pay | Admitting: *Deleted

## 2023-06-27 DIAGNOSIS — E785 Hyperlipidemia, unspecified: Secondary | ICD-10-CM | POA: Diagnosis present

## 2023-06-27 DIAGNOSIS — I2510 Atherosclerotic heart disease of native coronary artery without angina pectoris: Secondary | ICD-10-CM | POA: Diagnosis present

## 2023-06-27 DIAGNOSIS — I742 Embolism and thrombosis of arteries of the upper extremities: Secondary | ICD-10-CM

## 2023-06-27 DIAGNOSIS — I350 Nonrheumatic aortic (valve) stenosis: Secondary | ICD-10-CM | POA: Diagnosis present

## 2023-06-27 DIAGNOSIS — Z79899 Other long term (current) drug therapy: Secondary | ICD-10-CM

## 2023-06-27 DIAGNOSIS — I10 Essential (primary) hypertension: Secondary | ICD-10-CM | POA: Diagnosis present

## 2023-06-27 DIAGNOSIS — Z8673 Personal history of transient ischemic attack (TIA), and cerebral infarction without residual deficits: Secondary | ICD-10-CM | POA: Diagnosis present

## 2023-06-27 LAB — HEPATIC FUNCTION PANEL
ALT: 45 [IU]/L — ABNORMAL HIGH (ref 0–32)
AST: 23 [IU]/L (ref 0–40)
Albumin: 4.8 g/dL (ref 3.9–4.9)
Alkaline Phosphatase: 77 [IU]/L (ref 44–121)
Bilirubin Total: 0.6 mg/dL (ref 0.0–1.2)
Bilirubin, Direct: 0.15 mg/dL (ref 0.00–0.40)
Total Protein: 7.3 g/dL (ref 6.0–8.5)

## 2023-06-27 LAB — LIPID PANEL
Chol/HDL Ratio: 6.2 {ratio} — ABNORMAL HIGH (ref 0.0–4.4)
Cholesterol, Total: 277 mg/dL — ABNORMAL HIGH (ref 100–199)
HDL: 45 mg/dL (ref >39)
LDL Chol Calc (NIH): 187 mg/dL — ABNORMAL HIGH (ref 0–99)
Triglycerides: 232 mg/dL — ABNORMAL HIGH (ref 0–149)
VLDL Cholesterol Cal: 45 mg/dL — ABNORMAL HIGH (ref 5–40)

## 2023-06-27 NOTE — Telephone Encounter (Signed)
-----   Message from Wyn Raddle, Jackee Shove sent at 06/27/2023  6:48 AM EST ----- Please let patient know that her liver enzymes (ALT) mildly elevated and cholesterol numbers indicate that additional therapy is needed.  Your triglycerides are 232 and LDL are 187 with a total cholesterol of 277.  Please ask patient if she is interested in referral to lipid clinic to discuss potential alternatives that are available for assistance in lowering her lipid numbers.  Please let me know if you have any questions.   Jackee Wyn, NP

## 2023-06-27 NOTE — Telephone Encounter (Signed)
 The patient has been notified of the result and verbalized understanding.  All questions (if any) were answered.  Pt agreed to be referred to lipid clinic for further management of lipids.   Pt is aware I will place the referral in the system and send a message to our scheduling team to call her back and arrange this appt.   Pt asked that I tell the scheduler that Tue, Wed, Thursdays after 2 pm, is the best time to come in for this appt.   Will endorse this to the scheduling team.  Pt verbalized understanding and agrees with this plan.

## 2023-06-28 ENCOUNTER — Other Ambulatory Visit: Payer: Self-pay

## 2023-06-28 DIAGNOSIS — E063 Autoimmune thyroiditis: Secondary | ICD-10-CM

## 2023-06-28 DIAGNOSIS — E1165 Type 2 diabetes mellitus with hyperglycemia: Secondary | ICD-10-CM

## 2023-07-03 ENCOUNTER — Other Ambulatory Visit: Payer: Medicare Other

## 2023-07-10 ENCOUNTER — Ambulatory Visit (INDEPENDENT_AMBULATORY_CARE_PROVIDER_SITE_OTHER): Payer: Medicare Other | Admitting: Endocrinology

## 2023-07-10 ENCOUNTER — Encounter: Payer: Self-pay | Admitting: Endocrinology

## 2023-07-10 VITALS — BP 120/80 | HR 67 | Resp 20 | Ht 63.5 in | Wt 168.4 lb

## 2023-07-10 DIAGNOSIS — E1165 Type 2 diabetes mellitus with hyperglycemia: Secondary | ICD-10-CM

## 2023-07-10 DIAGNOSIS — Z7984 Long term (current) use of oral hypoglycemic drugs: Secondary | ICD-10-CM

## 2023-07-10 DIAGNOSIS — E063 Autoimmune thyroiditis: Secondary | ICD-10-CM

## 2023-07-10 LAB — POCT GLYCOSYLATED HEMOGLOBIN (HGB A1C): Hemoglobin A1C: 7.8 % — AB (ref 4.0–5.6)

## 2023-07-10 MED ORDER — LEVOTHYROXINE SODIUM 112 MCG PO TABS: 112.0000 ug | ORAL_TABLET | Freq: Every day | ORAL | 3 refills | Status: DC

## 2023-07-10 MED ORDER — METFORMIN HCL 1000 MG PO TABS
ORAL_TABLET | ORAL | 3 refills | Status: DC
Start: 2023-07-10 — End: 2024-01-02

## 2023-07-10 MED ORDER — GLIMEPIRIDE 2 MG PO TABS: ORAL_TABLET | ORAL | 3 refills | Status: DC

## 2023-07-10 NOTE — Progress Notes (Unsigned)
Outpatient Endocrinology Note [place] Keymora Grillot, MD  07/11/23  Patient's Name: Joanna [name]    DOB: [birth year]    MRN: [id number]                                                    REASON OF VISIT: Follow up of type 2 diabetes mellitus /hypothyroidism  PCP: Pcp, No  HISTORY OF PRESENT ILLNESS:   Joanna [name] is a 73 y.o. old female with past medical history listed below, is here for follow up for type 2 diabetes mellitus /hypothyroidism.   Pertinent Diabetes History: Patient was diagnosed with type 2 diabetes mellitus around 2010.  She has not been on insulin therapy.  Chronic Diabetes Complications : Retinopathy: no. Last ophthalmology exam was done on annually, following with ophthalmology regularly.  Nephropathy: no, on ACE/ARB ramipril Peripheral neuropathy: no Coronary artery disease: no Stroke: no  Relevant comorbidities and cardiovascular risk factors: Obesity: no Body mass index is 29.36 kg/m.  Hypertension: Yes  Hyperlipidemia : Yes, on statin. - LIPIDS: she has long-standing severe hypercholesterolemia and has been intolerant to all statin drugs with various reactions. Lipitor caused flulike symptoms, abdominal bloating and hot flashes, pravastatin caused neck swelling and upper body pain, Zocor caused GI upset. Was not able to tolerate Zetia as she thinks it upset his stomach.   She did not want to continue Repatha because of cost   LDL particle number previously over 2500 with ideal levels of below 1000   She is followed by cardiology and has been only able to take 1/4 tablet of Crestor every other day   Again she thinks that she has GI side effects with taking it every day and takes it at bedtime   LDL is over the last couple of years gradually increasing, has had periodic follow-up with lipid clinic She has previously refused to consider injectable drugs including Leqvio   Also taking small doses of OTC fish oil  Current / Home Diabetic regimen  includes:  Metformin 1000 mg 2 times a day. Amaryl /glimepiride 2 mg with supper daily.  Prior diabetic medications: Trulicity was stopped due to cost.  Greggory Keen could not continue due to cost as well.  Glycemic data:   Not able to download glucometer in the clinic today.  She reported some low blood sugar in the morning fasting 128, 88 and bedtime 166.  Hypoglycemia: Patient has ? hypoglycemic episodes. Patient has hypoglycemia awareness.  Factors modifying glucose control: 1.  Diabetic diet assessment: 3 meals a day.  Avoiding sweets and watching portion.  2.  Staying active or exercising: No formal exercise.  3.  Medication compliance: compliant all of the time.  # She has OSA on CPAP.  # Primary hypothyroidism: -Patient previously had high TSH of 9.2.  She has been on thyroid hormone replacements levothyroxine/Synthroid since the diagnosis.  She has been taking levothyroxine 112 mcg daily.  TSH has been consistently normal.  Interval history  Not able to download glucose data today.  She reported some of the blood sugar in the morning 128, 88 can at bedtime 166.  She had been for vacation and also due to holiday season did not have better diet.  Hemoglobin A1c still high 7.8%.  Diabetes has been reviewed and as noted above.  She has been taking levothyroxine 112 mcg  daily.  Denies any hypo or hyperthyroid symptoms.  No other complaints today.  REVIEW OF SYSTEMS As per history of present illness.   PAST MEDICAL HISTORY: Past Medical History:  Diagnosis Date   Abnormal liver function tests    Adenomatous colon polyp    CAD (coronary artery disease) 05/09/2018   a. LHC 05/09/18: DES to mid/dist LAD, mod AS   Cerebrovascular disease    a. CT 04/2016 -calcific plaque at the origin LEFT vertebral contributing to severe stenosis.   Diverticulitis of colon    Fatty liver    a. mild fatty liver infiltration by CT 2014.   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 Lynnette Caffey and Dr. Laneta Simmers   Statin intolerance    Stroke [facility])    a. right thalamic infarct in 04/2016 felt by neuro to be due to small vessel disease.   Type II diabetes mellitus (HCC)     PAST SURGICAL HISTORY: Past Surgical History:  Procedure Laterality Date   CARDIAC CATHETERIZATION     CATARACT EXTRACTION W/ INTRAOCULAR LENS  IMPLANT, BILATERAL Bilateral 08/2016   COLONOSCOPY W/ BIOPSIES AND POLYPECTOMY  05/19/2005   adenomatous polyps   CORONARY STENT INTERVENTION N/A 05/09/2018   Procedure: CORONARY STENT INTERVENTION;  Surgeon: Yvonne Kendall, MD;  Location: [facility] INVASIVE CV LAB;  Service: Cardiovascular;  Laterality: N/A;   INTRAOPERATIVE TRANSTHORACIC ECHOCARDIOGRAM N/A 06/21/2021   Procedure: INTRAOPERATIVE TRANSTHORACIC ECHOCARDIOGRAM;  Surgeon: Orbie Pyo, MD;  Location: [facility];  Service: Open Heart Surgery;  Laterality: N/A;   LEFT HEART CATH AND CORONARY ANGIOGRAPHY N/A 05/09/2018   Procedure: LEFT HEART CATH AND CORONARY ANGIOGRAPHY;  Surgeon: Yvonne Kendall, MD;  Location: [facility] INVASIVE CV LAB;  Service: Cardiovascular;  Laterality: N/A;   MELANOMA EXCISION  1980s   mid upper stomach   NEPHROLITHOTOMY Left 01/27/2013   Procedure: NEPHROLITHOTOMY PERCUTANEOUS;  Surgeon: Crecencio Mc, MD;  Location: [facility] ORS;  Service: Urology;  Laterality: Left;   RIGHT/LEFT HEART CATH AND CORONARY ANGIOGRAPHY N/A 05/04/2021   Procedure: RIGHT/LEFT HEART CATH AND CORONARY ANGIOGRAPHY;  Surgeon: Orbie Pyo, MD;  Location: [facility] INVASIVE CV LAB;  Service: Cardiovascular;  Laterality: N/A;   TRANSCATHETER AORTIC VALVE REPLACEMENT, TRANSFEMORAL N/A 06/21/2021    Procedure: TRANSCATHETER AORTIC VALVE REPLACEMENT, TRANSFEMORAL USING A 23 MM EDWARDS SAPIEN 3 ULTRA  AORTIC VALVE;  Surgeon: Orbie Pyo, MD;  Location: [facility] OR;  Service: Open Heart Surgery;  Laterality: N/A;   TUBAL LIGATION     WISDOM TEETH EXTRACTED      ALLERGIES: Allergies  Allergen Reactions   Crestor [Rosuvastatin]     REACTION: N/V and heartburn, can very small dose    Lipitor [Atorvastatin]     REACTION: Hot flashes and flu-like symptoms   Pravastatin Sodium     REACTION: Neck swelling and pain in her shoulders and arms.   Sulfonamide Derivatives     Rxn Unknown   Zetia [Ezetimibe]     GI upset   Zocor [Simvastatin] Other (See Comments)    GI issues.. Pt unknown of severity  Niacin Nausea And Vomiting    FAMILY HISTORY:  Family History  Problem Relation Age of Onset   Hyperlipidemia Mother    Neurofibromatosis Father    Thyroid disease Daughter    Hyperlipidemia Sister    Hyperlipidemia Brother    Diabetes Neg Hx    Stroke Neg Hx    Colon cancer Neg Hx    Esophageal cancer Neg Hx    Pancreatic cancer Neg Hx    Liver cancer Neg Hx     SOCIAL HISTORY: Social History   Socioeconomic History   Marital status: Married    Spouse name: Not on file   Number of children: 3   Years of education: 13   Highest education level: High school graduate  Occupational History   Occupation: Producer, television/film/video: [employer]    Comment: sits most of day   Tobacco Use   Smoking status: Never   Smokeless tobacco: Never  Vaping Use   Vaping status: Never Used  Substance and Sexual Activity   Alcohol use: Never   Drug use: Never   Sexual activity: Not Currently  Other Topics Concern   Not on file  Social History Narrative   Married, 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 One caffeinated drink daily.Updated as of 04/30/2013   Social Drivers of Health   Financial Resource Strain: Low Risk  (07/11/2018)   Overall Financial Resource Strain (CARDIA)     Difficulty of Paying Living Expenses: Not hard at all  Food Insecurity: No Food Insecurity (07/11/2018)   Hunger Vital Sign    Worried About Running Out of Food in the Last Year: Never true    Ran Out of Food in the Last Year: Never true  Transportation Needs: No Transportation Needs (07/11/2018)   PRAPARE - Administrator, Civil Service (Medical): No    Lack of Transportation (Non-Medical): No  Physical Activity: Sufficiently Active (07/11/2018)   Exercise Vital Sign    Days of Exercise per Week: 5 days    Minutes of Exercise per Session: 30 min  Stress: No Stress Concern Present (07/11/2018)   [employer] of Occupational Health - Occupational Stress Questionnaire    Feeling of Stress : Not at all  Social Connections: Not on file    MEDICATIONS:  Current Outpatient Medications  Medication Sig Dispense Refill   amLODipine (NORVASC) 2.5 MG tablet Take 1 tablet (2.5 mg total) by mouth daily. 90 tablet 3   amoxicillin (AMOXIL) 500 MG tablet Take 4 tablets (2,000 mg total) by mouth as directed. 1 HOUR PRIOR TO DENTAL APPOINTMENTS 12 tablet 16   aspirin EC 81 MG tablet Take 1 tablet (81 mg total) by mouth daily. 30 tablet 0   carvedilol (COREG) 6.25 MG tablet Take 1 tablet (6.25 mg total) by mouth 2 (two) times daily. 180 tablet 3   Magnesium 500 MG TABS Take 500 mg by mouth 2 (two) times daily.     nitroGLYCERIN (NITROSTAT) 0.4 MG SL tablet Place 1 tablet (0.4 mg total) under the tongue every 5 (five) minutes x 3 doses as needed for chest pain. 25 tablet 12   OneTouch Delica Lancets 33G MISC Use to test blood sugars BID and as needed 300 each 1   ramipril (ALTACE) 10 MG capsule Take 1 capsule (10 mg total) by mouth daily. 90 capsule 3   rosuvastatin (CRESTOR) 5 MG tablet Take 0.5 tablets (2.5 mg total) by mouth daily. 45 tablet 2  Blood Glucose Monitoring Suppl (ONETOUCH VERIO) w/Device KIT by Does not apply route. Use to check blood sugars once daily. (Patient not taking:  Reported on 07/10/2023)     glimepiride (AMARYL) 2 MG tablet TAKE 1 TABLET BY MOUTH EVERYDAY AT BEDTIME 90 tablet 3   glucose blood (ONE TOUCH ULTRA TEST) test strip USE TO CHECK BLOOD SUGAR ONCE DAILY Dx:E11.65 (Patient not taking: Reported on 07/10/2023) 300 each 3   levothyroxine (SYNTHROID) 112 MCG tablet Take 1 tablet (112 mcg total) by mouth daily. 90 tablet 3   metFORMIN (GLUCOPHAGE) 1000 MG tablet TAKE 1 TABLET BY MOUTH TWICE A DAY 180 tablet 3   No current facility-administered medications for this visit.    PHYSICAL EXAM: Vitals:   07/10/23 1529  BP: 120/80  Pulse: 67  Resp: 20  SpO2: 97%  Weight: 168 lb 6.4 oz (76.4 kg)  Height: 5' 3.5" (1.613 m)   Body mass index is 29.36 kg/m.  Wt Readings from Last 3 Encounters:  07/10/23 168 lb 6.4 oz (76.4 kg)  06/26/23 167 lb (75.8 kg)  03/20/23 165 lb 9.6 oz (75.1 kg)    General: Well developed, well nourished female in no apparent distress.  HEENT: AT/[place], no external lesions.  Eyes: Conjunctiva clear and no icterus. Neck: Neck supple  Lungs: Respirations not labored Neurologic: Alert, oriented, normal speech Extremities / Skin: Dry.  Psychiatric: Does not appear depressed or anxious  Diabetic Foot Exam - Simple   No data filed    LABS Reviewed Lab Results  Component Value Date   HGBA1C 7.8 (A) 07/10/2023   HGBA1C 7.9 (H) 03/15/2023   HGBA1C 7.7 Repeated and verified X2. (H) 12/06/2022   Lab Results  Component Value Date   FRUCTOSAMINE 262 08/12/2019   FRUCTOSAMINE 247 06/02/2016   FRUCTOSAMINE 274 (H) 07/16/2014   Lab Results  Component Value Date   CHOL 277 (H) 06/26/2023   HDL 45 06/26/2023   LDLCALC 187 (H) 06/26/2023   LDLDIRECT 209 (H) 03/19/2018   TRIG 232 (H) 06/26/2023   CHOLHDL 6.2 (H) 06/26/2023   Lab Results  Component Value Date   MICRALBCREAT 4.5 12/06/2022   MICRALBCREAT 6.7 12/01/2021   Lab Results  Component Value Date   CREATININE 0.62 03/15/2023   Lab Results  Component Value  Date   GFR 90.19 03/15/2023    ASSESSMENT / PLAN  1. Uncontrolled type 2 diabetes mellitus with hyperglycemia, without long-term current use of insulin (HCC)   2. Acquired autoimmune hypothyroidism     Diabetes Mellitus type 2, complicated by no known complications. - Diabetic status / severity: Uncontrolled.  Lab Results  Component Value Date   HGBA1C 7.8 (A) 07/10/2023    - Hemoglobin A1c goal : <7%  Discussed about importance of controlling diabetes.  She reports her diet was not good and she was on vacation.  Recent blood sugar on the glucometer acceptable.  Will not change the medication at this time. Discussed in detail about diet control even during vacations and travel.  GLP-1 receptor agonist was not able to continue due to cost.  - Medications: See below. No change on medications.   I) continue metformin 1000 mg 2 times a day. II) continue glimepiride/Amaryl  2 mg daily and advised to take in the morning with breakfast.  - Home glucose testing: In the morning fasting and at bedtime.    - Discussed/ Gave Hypoglycemia treatment plan.  # Consult : not required at this time.   # Annual urine  for microalbuminuria/ creatinine ratio, no microalbuminuria currently, continue ACE/ARB /ramipril. Last  Lab Results  Component Value Date   MICRALBCREAT 4.5 12/06/2022    # Foot check nightly.  # Annual dilated diabetic eye exams.   - Diet: Make healthy diabetic food choices. - Life style / activity / exercise: Discussed.  2. Blood pressure  -  BP Readings from Last 1 Encounters:  07/10/23 120/80    - Control is in target.  - No change in current plans.  3. Lipid status / Hyperlipidemia - Last  Lab Results  Component Value Date   LDLCALC 187 (H) 06/26/2023   - Continue Crestor 5 mg half tablet daily.  Managed by cardiology.  # Primary hypothyroidism -Thyroid lab normal in July 2024.  Continue current dose of levothyroxine 112 mcg daily.   Diagnoses and  all orders for this visit:  Uncontrolled type 2 diabetes mellitus with hyperglycemia, without long-term current use of insulin (HCC) -     POCT glycosylated hemoglobin (Hb A1C) -     glimepiride (AMARYL) 2 MG tablet; TAKE 1 TABLET BY MOUTH EVERYDAY AT BEDTIME -     metFORMIN (GLUCOPHAGE) 1000 MG tablet; TAKE 1 TABLET BY MOUTH TWICE A DAY -     Microalbumin / creatinine urine ratio -     Hemoglobin A1c  Acquired autoimmune hypothyroidism -     levothyroxine (SYNTHROID) 112 MCG tablet; Take 1 tablet (112 mcg total) by mouth daily. -     T4, free -     TSH     DISPOSITION Follow up in clinic in 6 months suggested.  Recommended 3 to 4 months follow-up due to uncontrolled diabetes mellitus.  Patient wants to keep 44-month follow-up.   All questions answered and patient verbalized understanding of the plan.  [place] Keirstin Musil, MD [facility] [address] [place], [address] [place], [place] [address] Phone # [phone]  At least part of this note was generated using voice recognition software. Inadvertent word errors may have occurred, which were not recognized during the proofreading process.

## 2023-07-10 NOTE — Patient Instructions (Addendum)
Latest Reference Range & Units 05/26/21 09:21 12/01/21 08:49 06/13/22 08:19 12/06/22 08:38 03/15/23 08:43 07/10/23 15:33  Hemoglobin A1C 4.0 - 5.6 % 6.2 6.6 (H) 7.0 (H) 7.7 Repeated and verified X2. (H) 7.9 (H) 7.8 !  (H): Data is abnormally high !: Data is abnormal  Same glimepiride and metformin.

## 2023-07-13 ENCOUNTER — Ambulatory Visit (HOSPITAL_COMMUNITY): Payer: Medicare Other | Attending: Cardiology

## 2023-07-13 DIAGNOSIS — I350 Nonrheumatic aortic (valve) stenosis: Secondary | ICD-10-CM | POA: Insufficient documentation

## 2023-07-13 DIAGNOSIS — E785 Hyperlipidemia, unspecified: Secondary | ICD-10-CM | POA: Insufficient documentation

## 2023-07-13 DIAGNOSIS — Z8673 Personal history of transient ischemic attack (TIA), and cerebral infarction without residual deficits: Secondary | ICD-10-CM | POA: Diagnosis present

## 2023-07-13 DIAGNOSIS — I2510 Atherosclerotic heart disease of native coronary artery without angina pectoris: Secondary | ICD-10-CM | POA: Diagnosis not present

## 2023-07-13 DIAGNOSIS — I742 Embolism and thrombosis of arteries of the upper extremities: Secondary | ICD-10-CM | POA: Diagnosis present

## 2023-07-13 DIAGNOSIS — I10 Essential (primary) hypertension: Secondary | ICD-10-CM | POA: Diagnosis not present

## 2023-07-13 LAB — ECHOCARDIOGRAM COMPLETE
AR max vel: 1.07 cm2
AV Area VTI: 1.16 cm2
AV Area mean vel: 1.07 cm2
AV Mean grad: 11.4 mm[Hg]
AV Peak grad: 21.8 mm[Hg]
Ao pk vel: 2.33 m/s
Area-P 1/2: 2.57 cm2
S' Lateral: 2.5 cm

## 2023-08-14 ENCOUNTER — Ambulatory Visit: Payer: Medicare Other

## 2023-10-04 ENCOUNTER — Ambulatory Visit: Attending: Cardiovascular Disease | Admitting: Pharmacist

## 2023-10-04 DIAGNOSIS — I2510 Atherosclerotic heart disease of native coronary artery without angina pectoris: Secondary | ICD-10-CM | POA: Diagnosis present

## 2023-10-04 DIAGNOSIS — E785 Hyperlipidemia, unspecified: Secondary | ICD-10-CM | POA: Diagnosis present

## 2023-10-04 NOTE — Progress Notes (Signed)
 Patient ID: Joanna [name]                 DOB: 06/30/1950                    MRN: [id number]      HPI: Joanna [name] is a 73 y.o. female patient referred to lipid clinic by Dr. Renna [name]. PMH is significant for aortic stenosis s/p TAVR (06/21/2021), hypertension, and hyperlipidemia, DM, coronary artery disease, prior LAD stent (2019) with moderate to severe aortic stenosis and prior stroke in 2018 with left arm periodic weakness.  Patient only able to take a low dose of rosuvastatin .  I saw the patient in a year ago to discuss lipid options.  At that time she had just increased her rosuvastatin  from 1.25 mg every other day to 2.5 mg every other day.  She refuses PCSK9.  We discussed Nexletol, Zetia , Nexlizet and Leqvio which she did not want to make any changes.  Her labs were repeated by Dr. Hubert [name].  Dr. Hubert [name] had discussed Leqvio with patient but she refused.  LDL-C at that time was 122.  LCL-C February 2025 was 187.  Triglycerides 232.   Patient presents today to lipid clinic.  She admits that she thought this appointment was for a CT.  Does not have any intention of changing her cholesterol medication.  She does state that she has increased her rosuvastatin  to 5 mg daily starting around April 1.  Reports increase in belching since then although admits that Tums does help.  Patient refuses injections.  Does not seem to be related to injecting herself.  Just does not want to try them.  She understands we are just trying to help her but after all her medication reactions she just rather "take her chances."  She is playing pickle ball and walking almost daily.  States that recently her brand Synthroid  was changed to generic levothyroxine .  She does have follow-up with endocrinology soon.   Current Medications: rosuvastatin  5mg  daily Intolerances: atorvastatin (weight gain, sweating, hot flashes, DM), pravastatin caused neck swelling and upper body pain, Zocor caused GI upset, zetia  (stomach upset),  rosuvastatin  daily(GI side effects) Risk Factors: DM, stoke, CAD LDL goal: <55  Diet:  avocado  Exercise: pickleball, walking 20-30 min daily  Family History: The patient's family history includes Hyperlipidemia in her brother, mother, and sister; Neurofibromatosis in her father; Thyroid  disease in her daughter. There is no history of Diabetes, Stroke, Colon cancer, Esophageal cancer, Pancreatic cancer, or Liver cancer.  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 One caffeinated drink daily.Updated as of 04/30/2013   Social Drivers of Health   Financial Resource Strain: Low Risk  (07/11/2018)   Overall Financial Resource Strain (CARDIA)    Difficulty  of Paying Living Expenses: Not hard at all  Food Insecurity: No Food Insecurity (07/11/2018)   Hunger Vital Sign    Worried About Running Out of Food in the Last Year: Never true    Ran Out of Food in the Last Year: Never true  Transportation Needs: No Transportation Needs (07/11/2018)   PRAPARE - Administrator, Civil Service (Medical): No    Lack of Transportation (Non-Medical): No  Physical Activity: Sufficiently Active (07/11/2018)   Exercise Vital Sign    Days of Exercise per Week: 5 days    Minutes of Exercise per Session: 30 min  Stress: No Stress Concern Present (07/11/2018)   [employer] of Occupational Health -  Occupational Stress Questionnaire    Feeling of Stress : Not at all  Social Connections: Not on file  Intimate Partner Violence: Not on file     Labs:06/26/23: TC 277, triglycerides 232, HDL 45, LDL-C 187  Past Medical History:  Diagnosis Date   Abnormal liver function tests    Adenomatous colon polyp    CAD (coronary artery disease) 05/09/2018   a. LHC 05/09/18: DES to mid/dist LAD, mod AS   Cerebrovascular disease    a. CT 04/2016 -calcific plaque at the origin LEFT vertebral contributing to severe stenosis.   Diverticulitis of colon    Fatty liver    a. mild fatty liver infiltration by CT 2014.   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 Lorie [name] and Dr. Sherene [name]   Statin intolerance    Stroke [facility])    a. right thalamic infarct in 04/2016 felt by neuro to be due to small vessel disease.   Type II diabetes mellitus (HCC)     Current Outpatient Medications on File Prior to Visit  Medication Sig Dispense Refill   amLODipine  (NORVASC ) 2.5 MG tablet Take 1 tablet (2.5 mg total) by mouth daily. 90 tablet 3   amoxicillin  (AMOXIL ) 500 MG tablet Take 4 tablets (2,000 mg total) by mouth as directed. 1 HOUR PRIOR TO DENTAL APPOINTMENTS 12 tablet 16   aspirin  EC 81 MG tablet Take 1 tablet (81 mg total) by mouth daily. 30 tablet 0   Blood Glucose Monitoring Suppl (ONETOUCH VERIO) w/Device KIT by Does not apply route. Use to check blood sugars once daily. (Patient not taking: Reported on 07/10/2023)     carvedilol  (COREG ) 6.25 MG tablet Take 1 tablet (6.25 mg total) by mouth 2 (two) times daily. 180 tablet 3   glimepiride  (AMARYL ) 2 MG tablet TAKE 1  TABLET BY MOUTH EVERYDAY AT BEDTIME 90 tablet 3   glucose blood (ONE TOUCH ULTRA TEST) test strip USE TO CHECK BLOOD SUGAR ONCE DAILY Dx:E11.65 (Patient not taking: Reported on 07/10/2023) 300 each 3   levothyroxine  (SYNTHROID ) 112 MCG tablet Take 1 tablet (112 mcg total) by mouth daily. 90 tablet 3   Magnesium  500 MG TABS Take 500 mg by mouth 2 (two) times daily.     metFORMIN  (GLUCOPHAGE ) 1000 MG tablet TAKE 1 TABLET BY MOUTH TWICE A DAY 180 tablet 3  nitroGLYCERIN  (NITROSTAT ) 0.4 MG SL tablet Place 1 tablet (0.4 mg total) under the tongue every 5 (five) minutes x 3 doses as needed for chest pain. 25 tablet 12   OneTouch Delica Lancets 33G MISC Use to test blood sugars BID and as needed 300 each 1   ramipril  (ALTACE ) 10 MG capsule Take 1 capsule (10 mg total) by mouth daily. 90 capsule 3   rosuvastatin  (CRESTOR ) 5 MG tablet Take 0.5 tablets (2.5 mg total) by mouth daily. 45 tablet 2   No current facility-administered medications on file prior to visit.    Allergies  Allergen Reactions   Crestor  [Rosuvastatin ]     REACTION: N/V and heartburn, can very small dose    Lipitor [Atorvastatin]     REACTION: Hot flashes and flu-like symptoms   Pravastatin Sodium     REACTION: Neck swelling and pain in her shoulders and arms.   Sulfonamide Derivatives     Rxn Unknown   Zetia  [Ezetimibe ]     GI upset   Zocor [Simvastatin] Other (See Comments)    GI issues.. Pt unknown of severity   Niacin Nausea And Vomiting    Assessment/Plan:  1. Hyperlipidemia - LDL-C is above goal of <55.  Patient refuses any changes in her medications today.  Refuses injections.  Briefly discussed Nexletol but patient refuses as well.  She has had significant and rare side effects to medications in the past and does not want to take anymore.  She did increase her rosuvastatin  from half a tablet every other day to a whole tablet (5 mg) daily about a month and a half ago.  Reports tolerating okay except for increase in  belching. Had issue a few years ago with generic levothyroxine .  Was switched to brand-name.  Appears recently was switched back to generic.  Reminded patient to make sure this is monitored closely as her thyroid  can affect her cholesterol.  She has follow-up with endocrinology next month. Patient is aware of the risks of elevated cholesterol and blood sugar.  She understands that she may not be able to get her cholesterol to goal without medication.  But she does choose not to explore any more medication options at this time.  States she will " take her chances"  Thank you,   Keats Kingry D Abbigayle Toole, Pharm.Monika Annas, CPP [facility] [address], [place], [place] [address]  Phone: [phone]; Fax: [phone]

## 2023-11-29 ENCOUNTER — Encounter: Payer: Self-pay | Admitting: Cardiology

## 2023-12-08 ENCOUNTER — Other Ambulatory Visit: Payer: Self-pay | Admitting: Endocrinology

## 2023-12-08 DIAGNOSIS — E1165 Type 2 diabetes mellitus with hyperglycemia: Secondary | ICD-10-CM

## 2023-12-26 ENCOUNTER — Other Ambulatory Visit

## 2023-12-27 LAB — HEMOGLOBIN A1C
Hgb A1c MFr Bld: 8 % — ABNORMAL HIGH (ref <5.7)
Mean Plasma Glucose: 183 mg/dL
eAG (mmol/L): 10.1 mmol/L

## 2023-12-27 LAB — BASIC METABOLIC PANEL WITH GFR
BUN: 18 mg/dL (ref 7–25)
CO2: 29 mmol/L (ref 20–32)
Calcium: 10.4 mg/dL (ref 8.6–10.4)
Chloride: 100 mmol/L (ref 98–110)
Creat: 0.72 mg/dL (ref 0.60–1.00)
Glucose, Bld: 156 mg/dL — ABNORMAL HIGH (ref 65–99)
Potassium: 5.2 mmol/L (ref 3.5–5.3)
Sodium: 137 mmol/L (ref 135–146)
eGFR: 89 mL/min/1.73m2 (ref >=60)

## 2023-12-27 LAB — MICROALBUMIN / CREATININE URINE RATIO
Creatinine, Urine: 82 mg/dL (ref 20–275)
Microalb Creat Ratio: 23 mg/g{creat} (ref <30)
Microalb, Ur: 1.9 mg/dL

## 2023-12-27 LAB — TSH: TSH: 5.83 m[IU]/L — ABNORMAL HIGH (ref 0.40–4.50)

## 2023-12-27 LAB — T4, FREE: Free T4: 1.3 ng/dL (ref 0.8–1.8)

## 2024-01-02 ENCOUNTER — Other Ambulatory Visit: Payer: Self-pay | Admitting: Internal Medicine

## 2024-01-02 ENCOUNTER — Telehealth: Payer: Self-pay

## 2024-01-02 ENCOUNTER — Ambulatory Visit (INDEPENDENT_AMBULATORY_CARE_PROVIDER_SITE_OTHER): Admitting: Internal Medicine

## 2024-01-02 VITALS — BP 120/68 | HR 70 | Ht 63.5 in | Wt 159.2 lb

## 2024-01-02 DIAGNOSIS — Z7984 Long term (current) use of oral hypoglycemic drugs: Secondary | ICD-10-CM | POA: Diagnosis not present

## 2024-01-02 DIAGNOSIS — E1165 Type 2 diabetes mellitus with hyperglycemia: Secondary | ICD-10-CM

## 2024-01-02 DIAGNOSIS — E063 Autoimmune thyroiditis: Secondary | ICD-10-CM

## 2024-01-02 LAB — GLUCOSE, POCT (MANUAL RESULT ENTRY): POC Glucose: 95 mg/dL (ref 70–99)

## 2024-01-02 MED ORDER — RYBELSUS 3 MG PO TABS: 3.0000 mg | ORAL_TABLET | Freq: Every day | ORAL | Status: AC

## 2024-01-02 MED ORDER — METFORMIN HCL 1000 MG PO TABS: 1000.0000 mg | ORAL_TABLET | Freq: Two times a day (BID) | ORAL | 3 refills | Status: AC

## 2024-01-02 MED ORDER — GLIMEPIRIDE 2 MG PO TABS
2.0000 mg | ORAL_TABLET | Freq: Every day | ORAL | 3 refills | Status: DC
Start: 2024-01-02 — End: 2024-01-03

## 2024-01-02 MED ORDER — SYNTHROID 112 MCG PO TABS: 112.0000 ug | ORAL_TABLET | Freq: Every day | ORAL | Status: DC

## 2024-01-02 NOTE — Progress Notes (Signed)
 Name: Joanna [name]  Age/ Sex: 73 y.o., female   MRN/ DOB: [id number], [birth year]     PCP: Pcp, No   Reason for Endocrinology Evaluation: Type 2 Diabetes Mellitus/Hypothyroidism  Initial Endocrine Consultative Visit: 03/24/2014    PATIENT IDENTIFIER: Joanna [name] is a 73 y.o. female with a past medical history of DM, hypothyroid, CAD, familial hypercholesterolemia, OSA. The patient has followed with Endocrinology clinic since 03/24/2014 for consultative assistance with management of her diabetes.  DIABETIC HISTORY:  Ms. Jastrzebski was diagnosed with DM in 2010.  She has been on metformin  since diagnosis, [place], she has not been on insulin  therapy. Her hemoglobin A1c has ranged from 6.9% in 2021, peaking at 8.0% in 2025.   THYROID  HISTORY:  Patient has been diagnosed with hypothyroidism years ago.  She has been on LT-4 replacement for years   Patient establish care initially with Dr. Von followed by Dr. Mercie, transitioned to my care 12/2023  She was on Synthroid  for years  SUBJECTIVE:   During the last visit (07/10/2023): A1c 7.8%  Today (01/02/2024): [name] is here for follow-up on diabetes management.  She checks blood sugars 2 times daily. The patient has not had hypoglycemic episodes since the last clinic visit.    Patient follows with cardiology for familial hypercholesterolemia On CPAP machine for OSA  Has injured her left knee which resulted in decrease exercise  Denies nausea or vomiting  Has occasional changes in bowel movements  She was on levothyroxine  for ~ 3 months which resulted in hair loss, fatigue and dry skin   Crestor  causes indigestion    HOME DIABETES REGIMEN:  Metformin  1000 mg twice daily Glimepiride  2 mg, 1 tablet at bedtime  Synthroid  112 mcg daily     Statin: yes ACE-I/ARB: No    METER DOWNLOAD SUMMARY: n/a    DIABETIC COMPLICATIONS: Microvascular complications:   Denies: Retinopathy, neuropathy Last Eye Exam: Completed    Macrovascular complications:  Hx CVA, CAD Denies:  PVD   HISTORY:  Past Medical History:  Past Medical History:  Diagnosis Date   Abnormal liver function tests    Adenomatous colon polyp    CAD (coronary artery disease) 05/09/2018   a. LHC 05/09/18: DES to mid/dist LAD, mod AS   Cerebrovascular disease    a. CT 04/2016 -calcific plaque at the origin LEFT vertebral contributing to severe stenosis.   Diverticulitis of colon    Fatty liver    a. mild fatty liver infiltration by CT 2014.   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 Wendel and Dr. Lucas   Statin intolerance    Stroke [facility])    a. right thalamic infarct in 04/2016 felt by neuro to be due to small vessel disease.   Type II diabetes mellitus (HCC)    Past Surgical History:  Past Surgical History:  Procedure Laterality Date   CARDIAC CATHETERIZATION     CATARACT EXTRACTION W/ INTRAOCULAR LENS  IMPLANT, BILATERAL Bilateral 08/2016   COLONOSCOPY W/ BIOPSIES AND POLYPECTOMY  05/19/2005   adenomatous polyps  CORONARY STENT INTERVENTION N/A 05/09/2018   Procedure: CORONARY STENT INTERVENTION;  Surgeon: Mady Bruckner, MD;  Location: [facility];  Service: Cardiovascular;  Laterality: N/A;   INTRAOPERATIVE TRANSTHORACIC ECHOCARDIOGRAM N/A 06/21/2021   Procedure: INTRAOPERATIVE TRANSTHORACIC ECHOCARDIOGRAM;  Surgeon: Wendel Lurena POUR, MD;  Location[facility];  Service: Open Heart Surgery;  Laterality: N/A;   LEFT HEART CATH AND CORONARY ANGIOGRAPHY N/A 05/09/2018   Procedure: LEFT HEART CATH AND CORONARY ANGIOGRAPHY;  Surgeon: Mady Bruckner, MD;  Location: [facility] CV  LAB;  Service: Cardiovascular;  Laterality: N/A;   MELANOMA EXCISION  1980s   mid upper stomach   NEPHROLITHOTOMY Left 01/27/2013   Procedure: NEPHROLITHOTOMY PERCUTANEOUS;  Surgeon: Noretta Ferrara, MD;  Location: [facility] ORS;  Service: Urology;  Laterality: Left;   RIGHT/LEFT HEART CATH AND CORONARY ANGIOGRAPHY N/A 05/04/2021   Procedure: RIGHT/LEFT HEART CATH AND CORONARY ANGIOGRAPHY;  Surgeon: Wendel Lurena POUR, MD;  Location: [facility];  Service: Cardiovascular;  Laterality: N/A;   TRANSCATHETER AORTIC VALVE REPLACEMENT, TRANSFEMORAL N/A 06/21/2021   Procedure: TRANSCATHETER AORTIC VALVE REPLACEMENT, TRANSFEMORAL USING A 23 MM EDWARDS SAPIEN 3 ULTRA  AORTIC VALVE;  Surgeon: Thukkani, Arun K, MD;  Location: [facility] OR;  Service: Open Heart Surgery;  Laterality: N/A;   TUBAL LIGATION     WISDOM TEETH EXTRACTED     Social History:  reports that she has never smoked. She has never used smokeless tobacco. She reports that she does not drink alcohol and does not use drugs. Family History:  Family History  Problem Relation Age of Onset   Hyperlipidemia Mother    Neurofibromatosis Father    Thyroid  disease Daughter    Hyperlipidemia Sister    Hyperlipidemia Brother    Diabetes Neg Hx    Stroke Neg Hx    Colon cancer Neg Hx    Esophageal cancer Neg Hx    Pancreatic cancer Neg Hx    Liver cancer Neg Hx      HOME MEDICATIONS: Allergies as of 01/02/2024       Reactions   Crestor  [rosuvastatin ]    REACTION: N/V and heartburn, can very small dose    Lipitor [atorvastatin]    REACTION: Hot flashes and flu-like symptoms   Pravastatin Sodium    REACTION: Neck swelling and pain in her shoulders and arms.   Sulfonamide Derivatives    Rxn Unknown   Zetia  [ezetimibe ]    GI upset   Zocor [simvastatin] Other (See Comments)   GI issues.. Pt unknown of severity   Niacin Nausea And Vomiting        Medication List        Accurate as of January 02, 2024  1:05 PM. If you have any questions, ask  your nurse or doctor.          amLODipine  2.5 MG tablet Commonly known as: NORVASC  Take 1 tablet (2.5 mg total) by mouth daily.   amoxicillin  500 MG tablet Commonly known as: AMOXIL  Take 4 tablets (2,000 mg total) by mouth as directed. 1 HOUR PRIOR TO DENTAL APPOINTMENTS   aspirin  EC 81 MG tablet Take 1 tablet (81 mg total) by mouth daily.   carvedilol  6.25 MG tablet Commonly known as: COREG  Take 1 tablet (6.25 mg total) by mouth 2 (two) times daily.   glimepiride  2 MG tablet Commonly known as: AMARYL  TAKE 1 TABLET BY MOUTH EVERYDAY AT BEDTIME   glucose blood test strip Commonly known as: ONE TOUCH ULTRA TEST USE TO CHECK BLOOD SUGAR ONCE  DAILY Dx:E11.65   levothyroxine  112 MCG tablet Commonly known as: Synthroid  Take 1 tablet (112 mcg total) by mouth daily.   Magnesium  500 MG Tabs Take 500 mg by mouth 2 (two) times daily.   metFORMIN  1000 MG tablet Commonly known as: GLUCOPHAGE  TAKE 1 TABLET BY MOUTH TWICE A DAY   nitroGLYCERIN  0.4 MG SL tablet Commonly known as: NITROSTAT  Place 1 tablet (0.4 mg total) under the tongue every 5 (five) minutes x 3 doses as needed for chest pain.   OneTouch Delica Lancets 33G Misc Use to test blood sugars BID and as needed   OneTouch Verio w/Device Kit by Does not apply route. Use to check blood sugars once daily.   ramipril  10 MG capsule Commonly known as: ALTACE  Take 1 capsule (10 mg total) by mouth daily.   rosuvastatin  5 MG tablet Commonly known as: CRESTOR  Take 0.5 tablets (2.5 mg total) by mouth daily. What changed: how much to take         OBJECTIVE:   Vital Signs: BP 120/68   Pulse 70   Ht 5' 3.5 (1.613 m)   Wt 159 lb 3.2 oz (72.2 kg)   SpO2 97%   BMI 27.76 kg/m   Wt Readings from Last 3 Encounters:  07/10/23 168 lb 6.4 oz (76.4 kg)  06/26/23 167 lb (75.8 kg)  03/20/23 165 lb 9.6 oz (75.1 kg)     Exam: General: Pt appears well and is in NAD  Neck: General: Supple without adenopathy. Thyroid :  Thyroid  size normal.  No goiter or nodules appreciated.   Lungs: Clear with good BS bilat   Heart: RRR   Extremities: No pretibial edema.   Neuro: MS is good with appropriate affect, pt is alert and Ox3    DM foot exam: 01/02/2024  The skin of the feet is intact without sores or ulcerations. The pedal pulses are 2+ on right and 2+ on left. The sensation is intact to a screening 5.07, 10 gram monofilament bilaterally     DATA REVIEWED:  Lab Results  Component Value Date   HGBA1C 8.0 (H) 12/26/2023   HGBA1C 7.8 (A) 07/10/2023   HGBA1C 7.9 (H) 03/15/2023    Latest Reference Range & Units 12/26/23 08:13  Sodium 135 - 146 mmol/L 137  Potassium 3.5 - 5.3 mmol/L 5.2  Chloride 98 - 110 mmol/L 100  CO2 20 - 32 mmol/L 29  Glucose 65 - 99 mg/dL 843 (H)  Mean Plasma Glucose mg/dL 816  BUN 7 - 25 mg/dL 18  Creatinine 9.39 - 8.99 mg/dL 9.27  Calcium  8.6 - 10.4 mg/dL 89.5  BUN/Creatinine Ratio 6 - 22 (calc) SEE NOTE:  eGFR > OR = 60 mL/min/1.60m2 89    Latest Reference Range & Units 12/26/23 08:13  eAG (mmol/L) mmol/L 10.1  Glucose 65 - 99 mg/dL 843 (H)  Hemoglobin J8R <5.7 % 8.0 (H)  TSH 0.40 - 4.50 mIU/L 5.83 (H)  T4,Free(Direct) 0.8 - 1.8 ng/dL 1.3      Latest Reference Range & Units 12/26/23 08:13  Microalb, Ur mg/dL 1.9  MICROALB/CREAT RATIO <30 mg/g creat 23  Creatinine, Urine 20 - 275 mg/dL 82   Old records , labs and images have been reviewed.     ASSESSMENT / PLAN / RECOMMENDATIONS:   1) Type 2 Diabetes Mellitus, Poorly controlled, With macrovascular complications - Most recent A1c of 8.0 %. Goal A1c < 7.0 %.     - Patient continues with uncontrolled DM - GLP-1 agonist SGLT2 inhibitors have  been cost prohibitive - I did recommend patient assistance forms but she does not believe she would qualify - A sample of #30 Rybelsus  3 mg was provided, caution against GI side effects, emphasized the importance of taking this 30 minutes before breakfast - Patient has been  taking glimepiride  at bedtime, I did encourage the patient to change glimepiride  intake to 1 tablet before first meal of the day, she opted not to increase the dose of glimepiride  at this time and will try taking it in the morning to see if that makes a difference - We did discuss the importance of avoiding snacks and discussed low-carb snacks if needed - She will contact me should she need a prescription for Rybelsus   MEDICATIONS: Continue metformin  1000 mg twice daily Take glimepiride  2 mg, 1 tablet before breakfast Rybelsus  3 mg daily  EDUCATION / INSTRUCTIONS: BG monitoring instructions: Patient is instructed to check her blood sugars 2 times a day. Call [place] [facility] if: BG persistently < 70  I reviewed the Rule of 15 for the treatment of hypoglycemia in detail with the patient. Literature supplied.    2) Diabetic complications:  Eye: Does not have known diabetic retinopathy.  Neuro/ Feet: Does not have known diabetic peripheral neuropathy .  Renal: Patient does not have known baseline CKD. She   is  on an ACEI/ARB at present.    3) Hypothyroidism:  - Patient did endorse symptoms of hypothyroid, her TSH was slightly elevated on recent labs that she attributes to taking generic levothyroxine .  She restarted taking brand Synthroid  2 weeks ago - We opted to remain on current dose and recheck on next visit as this historically has worked for her  Medication  Continue Synthroid  112 mcg daily   F/U in 3 months     I spent 35 minutes preparing to see the patient by review of recent labs, imaging and procedures, obtaining and reviewing separately obtained history, communicating with the patient, ordering medications, tests or procedures, and documenting clinical information in the EHR including the differential Dx, treatment, and any further evaluation and other management    Signed electronically by: Stefano Redgie Butts, MD  [facility] End[facility]  [facility] [address] [place] [place], [place] [address] Phone: [phone] FAX: [phone]   CC: Pcp, No No address on file Phone: None  Fax: None  Return to Endocrinology clinic as below: Future Appointments  Date Time Provider Department Center  01/02/2024  2:20 PM Karol Skarzynski, Donell Redgie, MD [id number] None

## 2024-01-02 NOTE — Patient Instructions (Addendum)
 Take Metformin  1000 mg twice daily Take Glimepiride  2 mg, 1 tablet before Breakfast          HOW TO TREAT LOW BLOOD SUGARS (Blood sugar LESS THAN 70 MG/DL) Please follow the RULE OF 15 for the treatment of hypoglycemia treatment (when your (blood sugars are less than 70 mg/dL)   STEP 1: Take 15 grams of carbohydrates when your blood sugar is low, which includes:  3-4 GLUCOSE TABS  OR 3-4 OZ OF JUICE OR REGULAR SODA OR ONE TUBE OF GLUCOSE GEL    STEP 2: RECHECK blood sugar in 15 MINUTES STEP 3: If your blood sugar is still low at the 15 minute recheck --> then, go back to STEP 1 and treat AGAIN with another 15 grams of carbohydrates.

## 2024-01-02 NOTE — Telephone Encounter (Signed)
 Sample  Medication:Rybelsus  Dose: 3 mg Quantity:1 box ONU:[id number] EXP:09/18/24   Requested Prescriptions   Signed Prescriptions Disp Refills   Semaglutide  (RYBELSUS ) 3 MG TABS      Sig: Take 1 tablet (3 mg total) by mouth daily. Quantity:1 box ONU:[id number] EXP:09/18/24    Authorizing Provider: SAM DONELL CARDINAL    Ordering User: CLEOTILDE ROLIN GORMAN ROLIN GORMAN CLEOTILDE 3:09 PM 01/02/2024

## 2024-01-03 ENCOUNTER — Other Ambulatory Visit: Payer: Medicare Other

## 2024-01-08 ENCOUNTER — Ambulatory Visit: Payer: Medicare Other | Admitting: Endocrinology

## 2024-03-11 ENCOUNTER — Other Ambulatory Visit: Payer: Self-pay | Admitting: Nurse Practitioner

## 2024-04-01 ENCOUNTER — Other Ambulatory Visit

## 2024-04-02 ENCOUNTER — Ambulatory Visit: Payer: Self-pay | Admitting: Internal Medicine

## 2024-04-02 LAB — T4, FREE: Free T4: 1.3 ng/dL (ref 0.8–1.8)

## 2024-04-02 LAB — THYROID PEROXIDASE ANTIBODY: Thyroperoxidase Ab SerPl-aCnc: 1 [IU]/mL (ref <9)

## 2024-04-02 LAB — TSH: TSH: 5.17 m[IU]/L — ABNORMAL HIGH (ref 0.40–4.50)

## 2024-04-02 MED ORDER — SYNTHROID 125 MCG PO TABS: 125.0000 ug | ORAL_TABLET | Freq: Every day | ORAL | 4 refills | Status: AC

## 2024-04-02 NOTE — Addendum Note (Signed)
 Addended by: SAM DONELL PARAS on: 04/02/2024 11:24 AM   Modules accepted: Orders

## 2024-04-08 ENCOUNTER — Ambulatory Visit: Admitting: Internal Medicine

## 2024-06-05 ENCOUNTER — Other Ambulatory Visit: Payer: Self-pay | Admitting: Nurse Practitioner

## 2024-06-05 NOTE — Telephone Encounter (Signed)
 In accordance with refill protocols, please review and address the following requirements before this medication refill can be authorized:  Labs

## 2024-06-15 ENCOUNTER — Other Ambulatory Visit: Payer: Self-pay | Admitting: Nurse Practitioner

## 2024-07-11 ENCOUNTER — Ambulatory Visit: Admitting: Cardiology
